# Patient Record
Sex: Male | Born: 1950 | Race: White | Hispanic: No | Smoking: Never smoker
Health system: Southern US, Community
[De-identification: ages and names within clinical notes are randomized; demographics above are authoritative.]

## PROBLEM LIST (undated history)

## (undated) DIAGNOSIS — I10 Essential (primary) hypertension: Secondary | ICD-10-CM

## (undated) DIAGNOSIS — E785 Hyperlipidemia, unspecified: Secondary | ICD-10-CM

## (undated) DIAGNOSIS — I5022 Chronic systolic (congestive) heart failure: Secondary | ICD-10-CM

## (undated) DIAGNOSIS — I4821 Permanent atrial fibrillation: Secondary | ICD-10-CM

## (undated) DIAGNOSIS — K625 Hemorrhage of anus and rectum: Secondary | ICD-10-CM

## (undated) DIAGNOSIS — I255 Ischemic cardiomyopathy: Secondary | ICD-10-CM

## (undated) DIAGNOSIS — I350 Nonrheumatic aortic (valve) stenosis: Secondary | ICD-10-CM

## (undated) DIAGNOSIS — K529 Noninfective gastroenteritis and colitis, unspecified: Secondary | ICD-10-CM

## (undated) DIAGNOSIS — K5792 Diverticulitis of intestine, part unspecified, without perforation or abscess without bleeding: Secondary | ICD-10-CM

## (undated) DIAGNOSIS — E118 Type 2 diabetes mellitus with unspecified complications: Secondary | ICD-10-CM

## (undated) DIAGNOSIS — I2510 Atherosclerotic heart disease of native coronary artery without angina pectoris: Secondary | ICD-10-CM

## (undated) HISTORY — DX: Hemorrhage of anus and rectum: K62.5

## (undated) HISTORY — DX: Nonrheumatic aortic (valve) stenosis: I35.0

## (undated) HISTORY — DX: Essential (primary) hypertension: I10

## (undated) HISTORY — DX: Chronic systolic (congestive) heart failure: I50.22

## (undated) HISTORY — PX: AORTIC VALVE REPLACEMENT: SHX41

## (undated) HISTORY — DX: Ischemic cardiomyopathy: I25.5

## (undated) HISTORY — DX: Type 2 diabetes mellitus with unspecified complications: E11.8

## (undated) HISTORY — PX: JOINT REPLACEMENT: SHX530

## (undated) HISTORY — PX: CORONARY ARTERY BYPASS GRAFT: SHX141

---

## 1998-07-03 HISTORY — PX: KNEE ARTHROPLASTY: SHX992

## 2017-02-24 ENCOUNTER — Emergency Department: Payer: BLUE CROSS/BLUE SHIELD

## 2017-02-24 ENCOUNTER — Inpatient Hospital Stay: Payer: BLUE CROSS/BLUE SHIELD

## 2017-02-24 ENCOUNTER — Inpatient Hospital Stay
Admission: EM | Admit: 2017-02-24 | Discharge: 2017-02-27 | DRG: 251 | Disposition: A | Discharge status: Home / Self Care | Payer: BLUE CROSS/BLUE SHIELD | Attending: Internal Medicine | Admitting: Internal Medicine

## 2017-02-24 ENCOUNTER — Encounter: Payer: Self-pay | Admitting: Emergency Medicine

## 2017-02-24 DIAGNOSIS — I252 Old myocardial infarction: Secondary | ICD-10-CM | POA: Diagnosis not present

## 2017-02-24 DIAGNOSIS — R7989 Other specified abnormal findings of blood chemistry: Secondary | ICD-10-CM | POA: Diagnosis not present

## 2017-02-24 DIAGNOSIS — I214 Non-ST elevation (NSTEMI) myocardial infarction: Secondary | ICD-10-CM | POA: Diagnosis not present

## 2017-02-24 DIAGNOSIS — E785 Hyperlipidemia, unspecified: Secondary | ICD-10-CM | POA: Diagnosis present

## 2017-02-24 DIAGNOSIS — R040 Epistaxis: Secondary | ICD-10-CM | POA: Diagnosis not present

## 2017-02-24 DIAGNOSIS — Z7982 Long term (current) use of aspirin: Secondary | ICD-10-CM

## 2017-02-24 DIAGNOSIS — I10 Essential (primary) hypertension: Secondary | ICD-10-CM | POA: Diagnosis present

## 2017-02-24 DIAGNOSIS — Z955 Presence of coronary angioplasty implant and graft: Secondary | ICD-10-CM

## 2017-02-24 DIAGNOSIS — Z888 Allergy status to other drugs, medicaments and biological substances status: Secondary | ICD-10-CM | POA: Diagnosis not present

## 2017-02-24 DIAGNOSIS — I2510 Atherosclerotic heart disease of native coronary artery without angina pectoris: Secondary | ICD-10-CM | POA: Diagnosis not present

## 2017-02-24 DIAGNOSIS — I25119 Atherosclerotic heart disease of native coronary artery with unspecified angina pectoris: Secondary | ICD-10-CM | POA: Diagnosis present

## 2017-02-24 DIAGNOSIS — K921 Melena: Secondary | ICD-10-CM | POA: Diagnosis not present

## 2017-02-24 DIAGNOSIS — R079 Chest pain, unspecified: Secondary | ICD-10-CM | POA: Diagnosis present

## 2017-02-24 DIAGNOSIS — Z7902 Long term (current) use of antithrombotics/antiplatelets: Secondary | ICD-10-CM

## 2017-02-24 DIAGNOSIS — Y92230 Patient room in hospital as the place of occurrence of the external cause: Secondary | ICD-10-CM | POA: Diagnosis not present

## 2017-02-24 DIAGNOSIS — T45515A Adverse effect of anticoagulants, initial encounter: Secondary | ICD-10-CM | POA: Diagnosis not present

## 2017-02-24 DIAGNOSIS — Z79899 Other long term (current) drug therapy: Secondary | ICD-10-CM

## 2017-02-24 DIAGNOSIS — E119 Type 2 diabetes mellitus without complications: Secondary | ICD-10-CM | POA: Diagnosis present

## 2017-02-24 DIAGNOSIS — Z951 Presence of aortocoronary bypass graft: Secondary | ICD-10-CM

## 2017-02-24 DIAGNOSIS — I359 Nonrheumatic aortic valve disorder, unspecified: Secondary | ICD-10-CM | POA: Diagnosis present

## 2017-02-24 DIAGNOSIS — E876 Hypokalemia: Secondary | ICD-10-CM | POA: Diagnosis present

## 2017-02-24 DIAGNOSIS — Y831 Surgical operation with implant of artificial internal device as the cause of abnormal reaction of the patient, or of later complication, without mention of misadventure at the time of the procedure: Secondary | ICD-10-CM | POA: Diagnosis present

## 2017-02-24 DIAGNOSIS — Z952 Presence of prosthetic heart valve: Secondary | ICD-10-CM

## 2017-02-24 DIAGNOSIS — T82855A Stenosis of coronary artery stent, initial encounter: Secondary | ICD-10-CM | POA: Diagnosis present

## 2017-02-24 DIAGNOSIS — Z8249 Family history of ischemic heart disease and other diseases of the circulatory system: Secondary | ICD-10-CM | POA: Diagnosis not present

## 2017-02-24 HISTORY — DX: Noninfective gastroenteritis and colitis, unspecified: K52.9

## 2017-02-24 HISTORY — DX: Diverticulitis of intestine, part unspecified, without perforation or abscess without bleeding: K57.92

## 2017-02-24 HISTORY — DX: Atherosclerotic heart disease of native coronary artery without angina pectoris: I25.10

## 2017-02-24 LAB — BASIC METABOLIC PANEL
ANION GAP: 10 (ref 5–15)
BUN: 16 mg/dL (ref 6–20)
CHLORIDE: 99 mmol/L — AB (ref 101–111)
CO2: 27 mmol/L (ref 22–32)
Calcium: 9.6 mg/dL (ref 8.9–10.3)
Creatinine, Ser: 0.68 mg/dL (ref 0.61–1.24)
GFR calc Af Amer: >60 mL/min (ref >60)
GLUCOSE: 344 mg/dL — AB (ref 65–99)
POTASSIUM: 3.3 mmol/L — AB (ref 3.5–5.1)
Sodium: 136 mmol/L (ref 135–145)

## 2017-02-24 LAB — TROPONIN I
Troponin I: 0.03 ng/mL (ref <0.03)
Troponin I: 0.4 ng/mL (ref <0.03)
Troponin I: 1.92 ng/mL (ref <0.03)
Troponin I: 2.71 ng/mL (ref <0.03)

## 2017-02-24 LAB — CBC
HEMATOCRIT: 41.4 % (ref 40.0–52.0)
HEMOGLOBIN: 14 g/dL (ref 13.0–18.0)
MCH: 26.8 pg (ref 26.0–34.0)
MCHC: 33.9 g/dL (ref 32.0–36.0)
MCV: 79.1 fL — AB (ref 80.0–100.0)
Platelets: 203 10*3/uL (ref 150–440)
RBC: 5.23 MIL/uL (ref 4.40–5.90)
RDW: 16.2 % — AB (ref 11.5–14.5)
WBC: 8.9 10*3/uL (ref 3.8–10.6)

## 2017-02-24 LAB — PROTIME-INR
INR: 1.06
PROTHROMBIN TIME: 13.8 s (ref 11.4–15.2)

## 2017-02-24 LAB — GLUCOSE, CAPILLARY
Glucose-Capillary: 182 mg/dL — ABNORMAL HIGH (ref 65–99)
Glucose-Capillary: 149 mg/dL — ABNORMAL HIGH (ref 65–99)
Glucose-Capillary: 200 mg/dL — ABNORMAL HIGH (ref 65–99)
Glucose-Capillary: 224 mg/dL — ABNORMAL HIGH (ref 65–99)
Glucose-Capillary: 176 mg/dL — ABNORMAL HIGH (ref 65–99)

## 2017-02-24 LAB — HEPARIN LEVEL (UNFRACTIONATED)
HEPARIN UNFRACTIONATED: 0.38 [IU]/mL (ref 0.30–0.70)
Heparin Unfractionated: 0.46 IU/mL (ref 0.30–0.70)

## 2017-02-24 LAB — APTT: aPTT: 27 seconds (ref 24–36)

## 2017-02-24 LAB — MAGNESIUM: Magnesium: 1.9 mg/dL (ref 1.7–2.4)

## 2017-02-24 LAB — TSH: TSH: 6.858 u[IU]/mL — ABNORMAL HIGH (ref 0.350–4.500)

## 2017-02-24 MED ORDER — ENOXAPARIN SODIUM 40 MG/0.4ML ~~LOC~~ SOLN: 40.0000 mg | SUBCUTANEOUS | Status: DC

## 2017-02-24 MED ORDER — ASPIRIN EC 81 MG PO TBEC
81.0000 mg | DELAYED_RELEASE_TABLET | Freq: Every day | ORAL | Status: DC
  Administered 2017-02-24 – 2017-02-27 (×3): 81 mg via ORAL
  Filled 2017-02-24 (×3): qty 1

## 2017-02-24 MED ORDER — TIROFIBAN (AGGRASTAT) BOLUS VIA INFUSION: 25.0000 ug/kg | Freq: Once | INTRAVENOUS | Status: DC

## 2017-02-24 MED ORDER — MORPHINE SULFATE (PF) 2 MG/ML IV SOLN
2.0000 mg | Freq: Once | INTRAVENOUS | Status: AC
  Administered 2017-02-24: 2 mg via INTRAVENOUS
  Filled 2017-02-24: qty 1

## 2017-02-24 MED ORDER — ACETAMINOPHEN 650 MG RE SUPP: 650.0000 mg | Freq: Four times a day (QID) | RECTAL | Status: DC | PRN

## 2017-02-24 MED ORDER — HEPARIN (PORCINE) IN NACL 100-0.45 UNIT/ML-% IJ SOLN
1200.0000 [IU]/h | INTRAMUSCULAR | Status: DC
  Administered 2017-02-24 – 2017-02-25 (×3): 1200 [IU]/h via INTRAVENOUS
  Filled 2017-02-24 (×3): qty 250

## 2017-02-24 MED ORDER — INSULIN ASPART 100 UNIT/ML ~~LOC~~ SOLN
0.0000 [IU] | Freq: Three times a day (TID) | SUBCUTANEOUS | Status: DC
Start: 2017-02-24 — End: 2017-02-27
  Administered 2017-02-24: 3 [IU] via SUBCUTANEOUS
  Administered 2017-02-24: 2 [IU] via SUBCUTANEOUS
  Administered 2017-02-24: 1 [IU] via SUBCUTANEOUS
  Administered 2017-02-25: 2 [IU] via SUBCUTANEOUS
  Administered 2017-02-25: 1 [IU] via SUBCUTANEOUS
  Administered 2017-02-25 – 2017-02-26 (×2): 2 [IU] via SUBCUTANEOUS
  Filled 2017-02-24 (×8): qty 1

## 2017-02-24 MED ORDER — SODIUM CHLORIDE 0.9 % IV SOLN
INTRAVENOUS | Status: DC
  Administered 2017-02-24 – 2017-02-27 (×4): via INTRAVENOUS

## 2017-02-24 MED ORDER — NITROGLYCERIN 0.4 MG SL SUBL
0.4000 mg | SUBLINGUAL_TABLET | SUBLINGUAL | Status: DC | PRN
  Administered 2017-02-24 – 2017-02-25 (×7): 0.4 mg via SUBLINGUAL
  Filled 2017-02-24 (×6): qty 1

## 2017-02-24 MED ORDER — TIROFIBAN (AGGRASTAT) BOLUS VIA INFUSION
25.0000 ug/kg | Freq: Once | INTRAVENOUS | Status: AC
  Administered 2017-02-24: 2580 ug via INTRAVENOUS
  Filled 2017-02-24: qty 52

## 2017-02-24 MED ORDER — MECLIZINE HCL 25 MG PO TABS
25.0000 mg | ORAL_TABLET | Freq: Three times a day (TID) | ORAL | Status: DC | PRN
  Filled 2017-02-24: qty 1

## 2017-02-24 MED ORDER — POTASSIUM CHLORIDE CRYS ER 20 MEQ PO TBCR
EXTENDED_RELEASE_TABLET | ORAL | Status: AC
  Administered 2017-02-24: 40 meq via ORAL
  Filled 2017-02-24: qty 2

## 2017-02-24 MED ORDER — DOCUSATE SODIUM 100 MG PO CAPS
100.0000 mg | ORAL_CAPSULE | Freq: Two times a day (BID) | ORAL | Status: DC
  Administered 2017-02-24 – 2017-02-27 (×4): 100 mg via ORAL
  Filled 2017-02-24 (×7): qty 1

## 2017-02-24 MED ORDER — FLUTICASONE PROPIONATE 50 MCG/ACT NA SUSP
1.0000 | Freq: Every day | NASAL | Status: DC
  Administered 2017-02-24 – 2017-02-27 (×4): 1 via NASAL
  Filled 2017-02-24: qty 16

## 2017-02-24 MED ORDER — LINAGLIPTIN 5 MG PO TABS
5.0000 mg | ORAL_TABLET | Freq: Every day | ORAL | Status: DC
  Administered 2017-02-24 – 2017-02-27 (×4): 5 mg via ORAL
  Filled 2017-02-24 (×4): qty 1

## 2017-02-24 MED ORDER — TIROFIBAN HCL IN NACL 5-0.9 MG/100ML-% IV SOLN: 0.1500 ug/kg/min | INTRAVENOUS | Status: DC

## 2017-02-24 MED ORDER — HEPARIN BOLUS VIA INFUSION
4000.0000 [IU] | Freq: Once | INTRAVENOUS | Status: AC
  Administered 2017-02-24: 4000 [IU] via INTRAVENOUS
  Filled 2017-02-24: qty 4000

## 2017-02-24 MED ORDER — ACETAMINOPHEN 325 MG PO TABS: 650.0000 mg | ORAL_TABLET | Freq: Four times a day (QID) | ORAL | Status: DC | PRN

## 2017-02-24 MED ORDER — INSULIN GLARGINE 100 UNIT/ML ~~LOC~~ SOLN
8.0000 [IU] | Freq: Every day | SUBCUTANEOUS | Status: AC
Start: 2017-02-24 — End: 2017-02-25
  Filled 2017-02-24: qty 0.08

## 2017-02-24 MED ORDER — TIROFIBAN HCL IN NACL 5-0.9 MG/100ML-% IV SOLN
0.1500 ug/kg/min | INTRAVENOUS | Status: DC
  Administered 2017-02-24 – 2017-02-25 (×4): 0.15 ug/kg/min via INTRAVENOUS
  Filled 2017-02-24 (×8): qty 100

## 2017-02-24 MED ORDER — ONDANSETRON HCL 4 MG/2ML IJ SOLN
4.0000 mg | Freq: Four times a day (QID) | INTRAMUSCULAR | Status: DC | PRN
  Administered 2017-02-25: 4 mg via INTRAVENOUS
  Filled 2017-02-24: qty 2

## 2017-02-24 MED ORDER — PANTOPRAZOLE SODIUM 40 MG PO TBEC
40.0000 mg | DELAYED_RELEASE_TABLET | Freq: Every day | ORAL | Status: DC
  Administered 2017-02-24 – 2017-02-27 (×4): 40 mg via ORAL
  Filled 2017-02-24 (×4): qty 1

## 2017-02-24 MED ORDER — POTASSIUM CHLORIDE CRYS ER 20 MEQ PO TBCR
40.0000 meq | EXTENDED_RELEASE_TABLET | Freq: Once | ORAL | Status: AC
  Administered 2017-02-24: 40 meq via ORAL
  Filled 2017-02-24: qty 2

## 2017-02-24 MED ORDER — LABETALOL HCL 5 MG/ML IV SOLN
INTRAVENOUS | Status: AC
  Filled 2017-02-24: qty 4

## 2017-02-24 MED ORDER — CLOPIDOGREL BISULFATE 75 MG PO TABS
75.0000 mg | ORAL_TABLET | Freq: Every day | ORAL | Status: DC
  Administered 2017-02-24 – 2017-02-27 (×4): 75 mg via ORAL
  Filled 2017-02-24 (×4): qty 1

## 2017-02-24 MED ORDER — MORPHINE SULFATE (PF) 2 MG/ML IV SOLN
1.0000 mg | INTRAVENOUS | Status: DC | PRN
  Administered 2017-02-24 (×2): 1 mg via INTRAVENOUS
  Administered 2017-02-24 – 2017-02-25 (×3): 2 mg via INTRAVENOUS
  Filled 2017-02-24 (×5): qty 1

## 2017-02-24 MED ORDER — INSULIN ASPART 100 UNIT/ML ~~LOC~~ SOLN: 0.0000 [IU] | Freq: Every day | SUBCUTANEOUS | Status: DC

## 2017-02-24 MED ORDER — LISINOPRIL 10 MG PO TABS
10.0000 mg | ORAL_TABLET | Freq: Every day | ORAL | Status: DC
  Administered 2017-02-24 – 2017-02-26 (×3): 10 mg via ORAL
  Filled 2017-02-24 (×3): qty 1

## 2017-02-24 MED ORDER — IOPAMIDOL (ISOVUE-370) INJECTION 76%
75.0000 mL | Freq: Once | INTRAVENOUS | Status: AC | PRN
  Administered 2017-02-24: 75 mL via INTRAVENOUS

## 2017-02-24 MED ORDER — ONDANSETRON HCL 4 MG PO TABS: 4.0000 mg | ORAL_TABLET | Freq: Four times a day (QID) | ORAL | Status: DC | PRN

## 2017-02-24 MED ORDER — ONDANSETRON HCL 4 MG/2ML IJ SOLN
4.0000 mg | Freq: Once | INTRAMUSCULAR | Status: AC
  Administered 2017-02-24: 4 mg via INTRAVENOUS
  Filled 2017-02-24: qty 2

## 2017-02-24 MED ORDER — LABETALOL HCL 5 MG/ML IV SOLN
10.0000 mg | INTRAVENOUS | Status: AC
  Administered 2017-02-24: 10 mg via INTRAVENOUS

## 2017-02-24 MED ORDER — METOPROLOL SUCCINATE ER 25 MG PO TB24
25.0000 mg | ORAL_TABLET | Freq: Every day | ORAL | Status: DC
  Administered 2017-02-24 – 2017-02-27 (×4): 25 mg via ORAL
  Filled 2017-02-24 (×4): qty 1

## 2017-02-24 MED ORDER — ATORVASTATIN CALCIUM 20 MG PO TABS
80.0000 mg | ORAL_TABLET | Freq: Every day | ORAL | Status: DC
  Administered 2017-02-24 – 2017-02-27 (×4): 80 mg via ORAL
  Filled 2017-02-24 (×4): qty 4

## 2017-02-24 MED ORDER — POTASSIUM CHLORIDE CRYS ER 20 MEQ PO TBCR
40.0000 meq | EXTENDED_RELEASE_TABLET | ORAL | Status: AC
  Administered 2017-02-24: 40 meq via ORAL

## 2017-02-24 NOTE — ED Provider Notes (Signed)
[facility] Emergency Department Provider Note   ____________________________________________   First MD Initiated Contact with Patient 02/24/17 0207     (approximate)  I have reviewed the triage vital signs and the nursing notes.   HISTORY  Chief Complaint Chest Pain    HPI Christopher Bayona Sr. is a 66 y.o. male who comes into the hospital today with chest pain. The patient reports that the pain started about 30-40 minutes prior to his arrival to the hospital. He was sleeping and he initially felt like he was having indigestion. He took 4 baby aspirin but the pain persisted in his mid chest. He states that EMS placed a Nitropaste on his chest and his blood pressure was over 200 systolic. The patient has had no sweats but has had some shortness of breath and nausea. The patient reports that the pain is in his mid chest and it radiates down both of his arms and his back. He denies any dizziness or lightheadedness. 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 His pain is currently a 7 out of 10 in intensity. He does endorse a headache.   Past Medical History:  Diagnosis Date  . CAD (coronary artery disease)   . Colitis   . Diabetes mellitus without complication (HCC)   . Diverticulitis   . Hypertension     Patient Active Problem List   Diagnosis Date Noted  . Chest pain 02/24/2017    Past Surgical History:  Procedure Laterality Date  . AORTIC VALVE REPLACEMENT    . CORONARY ARTERY BYPASS GRAFT      Prior to Admission medications   Medication Sig Start Date End Date Taking? Authorizing Provider  aspirin EC 81 MG tablet Take 81 mg by mouth daily.   Yes [provider]  atorvastatin (LIPITOR) 80 MG tablet Take 80 mg by mouth daily.   Yes [provider]  clopidogrel (PLAVIX) 75 MG tablet Take 75 mg by mouth daily.   Yes [provider]  fluticasone (FLONASE) 50 MCG/ACT nasal spray Place 1 spray into both  nostrils daily.   Yes [provider]  lisinopril (PRINIVIL,ZESTRIL) 10 MG tablet Take 10 mg by mouth at bedtime. 02/20/17  Yes [provider]  meclizine (ANTIVERT) 25 MG tablet Take 25 mg by mouth 3 (three) times daily as needed for dizziness.   Yes [provider]  metoprolol succinate (TOPROL-XL) 25 MG 24 hr tablet Take 25 mg by mouth daily.   Yes [provider]  pantoprazole (PROTONIX) 40 MG tablet Take 40 mg by mouth daily.   Yes [provider]  sitaGLIPtin (JANUVIA) 50 MG tablet Take 100 mg by mouth daily.   Yes [provider]    Allergies Metformin and related  Family History  Problem Relation Age of Onset  . CAD Mother   . Heart failure Mother   . CAD Brother     Social History Social History  Substance Use Topics  . Smoking status: Never Smoker  . Smokeless tobacco: Never Used  . Alcohol use No    Review of Systems  Constitutional: No fever/chills Eyes: No visual changes. ENT: No sore throat. Cardiovascular:  chest pain. Respiratory:  shortness of breath. Gastrointestinal: nausea with No abdominal pain, no vomiting.  No diarrhea.  No constipation. Genitourinary: Negative for dysuria. Musculoskeletal: Negative for back pain. Skin: Negative for rash. Neurological: Negative for headaches, focal weakness or numbness.   ____________________________________________   PHYSICAL  EXAM:  VITAL SIGNS: ED Triage Vitals  Enc Vitals Group     BP 02/24/17 0211 (!) 195/97     Pulse Rate 02/24/17 0211 93     Resp 02/24/17 0211 (!) 24     Temp 02/24/17 0211 98.6 F (37 C)     Temp Source 02/24/17 0211 Oral     SpO2 02/24/17 0211 100 %     Weight 02/24/17 0214 228 lb (103.4 kg)     Height 02/24/17 0214 5\' 10"  (1.778 m)     Head Circumference --      Peak Flow --      Pain Score 02/24/17 0210 7     Pain Loc --      Pain Edu? --      Excl. in GC? --     Constitutional: Alert and oriented. Well appearing and  in moderate distress. Eyes: Conjunctivae are normal. PERRL. EOMI. Head: Atraumatic. Nose: No congestion/rhinnorhea. Mouth/Throat: Mucous membranes are moist.  Oropharynx non-erythematous. Cardiovascular: Normal rate, regular rhythm. Grossly normal heart sounds.  Good peripheral circulation. Respiratory: Normal respiratory effort.  No retractions. Lungs CTAB. Gastrointestinal: Soft and nontender. No distention. Positive bowel sounds Musculoskeletal: No lower extremity tenderness nor edema.   Neurologic:  Normal speech and language. Skin:  Skin is warm, dry and intact.  Psychiatric: Mood and affect are normal.  ____________________________________________   LABS (all labs ordered are listed, but only abnormal results are displayed)  Labs Reviewed  BASIC METABOLIC PANEL - Abnormal; Notable for the following:       Result Value   Potassium 3.3 (*)    Chloride 99 (*)    Glucose, Bld 344 (*)    All other components within normal limits  CBC - Abnormal; Notable for the following:    MCV 79.1 (*)    RDW 16.2 (*)    All other components within normal limits  TROPONIN I - Abnormal; Notable for the following:    Troponin I 0.03 (*)    All other components within normal limits  PROTIME-INR  APTT  TSH  TROPONIN I  TROPONIN I  TROPONIN I   ____________________________________________  EKG  ED ECG REPORT I, Rebecka Apley, the attending physician, personally viewed and interpreted this ECG.   Date: 02/24/2017  EKG Time: 210  Rate: 96  Rhythm: normal sinus rhythm  Axis: normal  Intervals:none  ST&T Change: ST depression in leads I, V4, V5  ____________________________________________  RADIOLOGY  Dg Chest Portable 1 View  Result Date: 02/24/2017 CLINICAL DATA:  66 year old male awoken by chest pain shortness of breath and nausea. EXAM: PORTABLE CHEST 1 VIEW COMPARISON:  None. FINDINGS: Portable AP upright view at 0220 hours. Previous sternotomy with evidence of aortic  valve replacement and CABG. Cardiomegaly. Other mediastinal contours are within normal limits. Visualized tracheal air column is within normal limits. Mild obscuration of the medial diaphragm appears likely due to cardiomegaly related hypo ventilation. Otherwise allowing for portable technique the lungs are clear. No pneumothorax or pleural effusion. IMPRESSION: Cardiomegaly.  No acute cardiopulmonary abnormality. Electronically Signed   By: Odessa Fleming M.D.   On: 02/24/2017 02:37    ____________________________________________   PROCEDURES  Procedure(s) performed: None  Procedures  Critical Care performed: No  ____________________________________________   INITIAL IMPRESSION / ASSESSMENT AND PLAN / ED COURSE  Pertinent labs & imaging results that were available during my care of the patient were reviewed by me and considered in my medical decision making (see chart for  details).  This is a 66 year old male who comes into the hospital with chest pain. The patient has a significant cardiac history and would score a 7 on the heart score. The patient did receive some NTG which lowered his pain to a 5 and he did then receive some morphine for his pain. Given his continued pain and his history I will admit the patient to the hospitalist service.       ____________________________________________   FINAL CLINICAL IMPRESSION(S) / ED DIAGNOSES  Final diagnoses:  Chest pain, unspecified type      NEW MEDICATIONS STARTED DURING THIS VISIT:  Current Discharge Medication List       Note:  This document was prepared using Dragon voice recognition software and may include unintentional dictation errors.    Rebecka Apley, MD 02/24/17 [phone]

## 2017-02-24 NOTE — Progress Notes (Signed)
  MEDICATION RELATED CONSULT NOTE - INITIAL   Pharmacy Consult for Tirofiban Indication: ACS  Allergies  Allergen Reactions  . Metformin And Related     Patient Measurements: Height: 5\' 10"  (177.8 cm) Weight: 227 lb 9.6 oz (103.2 kg) IBW/kg (Calculated) : 73 Adjusted Body Weight:   Vital Signs: Temp: 98.1 F (36.7 C) (08/25 1306) Temp Source: Oral (08/25 1306) BP: 115/73 (08/25 1652) Pulse Rate: 60 (08/25 1613) Intake/Output from previous day: No intake/output data recorded. Intake/Output from this shift: Total I/O In: 137.7 [I.V.:137.7] Out: 250 [Urine:250]  Labs:  Recent Labs  02/24/17 0214 02/24/17 1017  WBC 8.9  --   HGB 14.0  --   HCT 41.4  --   PLT 203  --   APTT 27  --   CREATININE 0.68  --   MG  --  1.9   Estimated Creatinine Clearance: 112.3 mL/min (by C-G formula based on SCr of 0.68 mg/dL).   Microbiology: No results found for this or any previous visit (from the past 720 hour(s)).  Medical History: Past Medical History:  Diagnosis Date  . CAD (coronary artery disease)   . Colitis   . Diabetes mellitus without complication (HCC)   . Diverticulitis   . Hypertension     Medications:  Infusions:  . sodium chloride 75 mL/hr at 02/24/17 1607  . heparin 1,200 Units/hr (02/24/17 0859)  . tirofiban      Assessment: 66 yom with rising troponin. Planned cath on Monday. Pharmacy consulted to dose tirofiban - severely limited by Epic orderables.   Goal of Therapy:    Plan:  Tirofiban per orderset.  Carola Frost, Pharm.D., BCPS Clinical Pharmacist 02/24/2017,5:31 PM

## 2017-02-24 NOTE — Consult Note (Addendum)
Primary Physician:  Lorella Nimrod Zutler.   Primary Cardiologist:  New  Asked to see by Dr Sheryle Hail for CP  HPI: Pt is a 66 yo with history of CAD (s/p mult MI, PCI and CABG), DM, HTN   Admitted this AM with CP  Pain woke him from sleep  Substernal to back, arms.  8/10  In ED eased some with HTG.     Pt's cardiac hx dates to about 2008  Had stent placed ([facility] [place])  Did fine after until 2016 (December )   MI  Underwent CABG x 3 and AVR (23mm Edwards tissue valve on Jan 2017).  Did OK for 2 to 3 months Then downhill after  Init hosp with pneumonia  Has been hosp mult times for CP  Has had several stents  Last was 2 months ago   Complic after by bleeding in groin.  Vivien Presto, [facility] [phone]; Henrine Screws, [facility])  Did ok after last stent until now.  Just moved to [place] 3 wks ago to be near sister. Breathing has been OK throughout this Seen at [facility] week  Lisinopril increasd Had appt being set up at [facility] in cardiology  Not seen yet  Yesterday went to bed feeling OK  GOt up around 2 Felt he had indigestion  Then got pain in chest that went to back then to arms  (With prior angina he had CP and usually numbness but no pain in arms   Pain to back new.  Never had before)  Pt has no NTG at home   Took 4 ASA  81 mg  No missed meds  Called EMS  Got to ED at about 2 AM    In ED given NTG with easing  Now it is 2/10  No pain in back  No pain in arms  No jaw pain  Breathing OK throughout  Sl pressure    Dr Donnel Saxon at [facility] had just increased lisinopil to 20 mg   No dizzienss     ADDENDUM 02/24/17 Spoke with on call cardiologist in [place] CABG in Feb 2017:  LIMA to LAD, SVG to Diag  AVR Oct 2017 Cath  LIMA to LAD patent (distal anastomosis); SVG to Diag occluded.  Pt with tight stenoses prox and mid LCx  UNderwent DES x 2   RCA with 60% Echo November 2017 :  LVEF 60%  17 mm gradient across prosthesis Cath March 2018:  LCx  stents OK  LIMA patent  95% D1 Chronic dz  Left alone  RCA with 90% mid  Underwent DES to this region Seen in clinic in May 2018 by Dr Baird Lyons  Office number for records :  Madison Hickman  [phone]  Patient had another cath about 1-2 months ago at [facility] in [place]  No new narrowings found per family  Diabetic x 10 year      Past Medical History:  Diagnosis Date  . CAD (coronary artery disease)   . Colitis   . Diabetes mellitus without complication (HCC)   . Diverticulitis   . Hypertension     Medications Prior to Admission  Medication Sig Dispense Refill  . aspirin EC 81 MG tablet Take 81 mg by mouth daily.    Marland Kitchen atorvastatin (LIPITOR) 80 MG tablet Take 80 mg by mouth daily.    . clopidogrel (PLAVIX) 75 MG tablet Take 75 mg by mouth daily.    Marland Kitchen  fluticasone (FLONASE) 50 MCG/ACT nasal spray Place 1 spray into both nostrils daily.    Marland Kitchen lisinopril (PRINIVIL,ZESTRIL) 10 MG tablet Take 10 mg by mouth at bedtime.  3  . meclizine (ANTIVERT) 25 MG tablet Take 25 mg by mouth 3 (three) times daily as needed for dizziness.    . metoprolol succinate (TOPROL-XL) 25 MG 24 hr tablet Take 25 mg by mouth daily.    . pantoprazole (PROTONIX) 40 MG tablet Take 40 mg by mouth daily.    . sitaGLIPtin (JANUVIA) 50 MG tablet Take 100 mg by mouth daily.       Marland Kitchen aspirin EC  81 mg Oral Daily  . atorvastatin  80 mg Oral Daily  . clopidogrel  75 mg Oral Daily  . docusate sodium  100 mg Oral BID  . fluticasone  1 spray Each Nare Daily  . insulin aspart  0-5 Units Subcutaneous QHS  . insulin aspart  0-9 Units Subcutaneous TID WC  . insulin glargine  8 Units Subcutaneous QHS  . linagliptin  5 mg Oral Daily  . lisinopril  10 mg Oral QHS  . metoprolol succinate  25 mg Oral Daily  . pantoprazole  40 mg Oral Daily    Infusions: . heparin 1,200 Units/hr (02/24/17 0859)    Allergies  Allergen Reactions  . Metformin And Related     Social History   Social History  . Marital status: Divorced      Spouse name: N/A  . Number of children: N/A  . Years of education: N/A   Occupational History  . Not on file.   Social History Main Topics  . Smoking status: Never Smoker  . Smokeless tobacco: Never Used  . Alcohol use No  . Drug use: Unknown  . Sexual activity: Not on file   Other Topics Concern  . Not on file   Social History Narrative  . No narrative on file    Family History  Problem Relation Age of Onset  . CAD Mother   . Heart failure Mother   . CAD Brother   Brother died of MI   REVIEW OF SYSTEMS:  All systems reviewed  Negative to the above problem except as noted above.    PHYSICAL EXAM: Vitals:   02/24/17 0424 02/24/17 0811  BP: 137/65 116/60  Pulse: 80 76  Resp: 18 16  Temp: 98.2 F (36.8 C) 97.6 F (36.4 C)  SpO2: 92% 93%    No intake or output data in the 24 hours ending 02/24/17 0923  General:  Well appearing. No respiratory difficulty HEENT: normal Neck: supple. no JVD. Carotids 2+ bilat; Bruit on L No lymphadenopathy or thryomegaly appreciated. Cor: PMI nondisplaced. Regular rate & rhythm. No rubs, gallops  Gr I/VI systolic murmur   Lungs: clear Abdomen: soft, nontender, nondistended. No hepatosplenomegaly. No bruits or masses. Good bowel sounds. Extremities: no cyanosis, clubbing, rash, edema  2+ R PT   Tr L PT Neuro: alert & oriented x 3, cranial nerves grossly intact. moves all 4 extremities w/o difficulty. Affect pleasant.  ECG:  SR 96 bpm  First degree AV block  PR 210 msec Incomp RBBB  SL ST depression in lateral leads    Results for orders placed or performed during the hospital encounter of 02/24/17 (from the past 24 hour(s))  Basic metabolic panel     Status: Abnormal   Collection Time: 02/24/17  2:14 AM  Result Value Ref Range   Sodium 136 135 - 145 mmol/L  Potassium 3.3 (L) 3.5 - 5.1 mmol/L   Chloride 99 (L) 101 - 111 mmol/L   CO2 27 22 - 32 mmol/L   Glucose, Bld 344 (H) 65 - 99 mg/dL   BUN 16 6 - 20 mg/dL   Creatinine,  Ser 1.61 0.61 - 1.24 mg/dL   Calcium 9.6 8.9 - 09.6 mg/dL   GFR calc non Af Amer >60 >60 mL/min   GFR calc Af Amer >60 >60 mL/min   Anion gap 10 5 - 15  CBC     Status: Abnormal   Collection Time: 02/24/17  2:14 AM  Result Value Ref Range   WBC 8.9 3.8 - 10.6 K/uL   RBC 5.23 4.40 - 5.90 MIL/uL   Hemoglobin 14.0 13.0 - 18.0 g/dL   HCT 04.5 40.9 - 81.1 %   MCV 79.1 (L) 80.0 - 100.0 fL   MCH 26.8 26.0 - 34.0 pg   MCHC 33.9 32.0 - 36.0 g/dL   RDW 91.4 (H) 78.2 - 95.6 %   Platelets 203 150 - 440 K/uL  Troponin I     Status: Abnormal   Collection Time: 02/24/17  2:14 AM  Result Value Ref Range   Troponin I 0.03 (HH) <0.03 ng/mL  Protime-INR     Status: None   Collection Time: 02/24/17  2:14 AM  Result Value Ref Range   Prothrombin Time 13.8 11.4 - 15.2 seconds   INR 1.06   APTT     Status: None   Collection Time: 02/24/17  2:14 AM  Result Value Ref Range   aPTT 27 24 - 36 seconds  TSH     Status: Abnormal   Collection Time: 02/24/17  4:29 AM  Result Value Ref Range   TSH 6.858 (H) 0.350 - 4.500 uIU/mL  Troponin I     Status: Abnormal   Collection Time: 02/24/17  4:29 AM  Result Value Ref Range   Troponin I 0.40 (HH) <0.03 ng/mL  Glucose, capillary     Status: Abnormal   Collection Time: 02/24/17  8:53 AM  Result Value Ref Range   Glucose-Capillary 182 (H) 65 - 99 mg/dL   Dg Chest Portable 1 View  Result Date: 02/24/2017 CLINICAL DATA:  66 year old male awoken by chest pain shortness of breath and nausea. EXAM: PORTABLE CHEST 1 VIEW COMPARISON:  None. FINDINGS: Portable AP upright view at 0220 hours. Previous sternotomy with evidence of aortic valve replacement and CABG. Cardiomegaly. Other mediastinal contours are within normal limits. Visualized tracheal air column is within normal limits. Mild obscuration of the medial diaphragm appears likely due to cardiomegaly related hypo ventilation. Otherwise allowing for portable technique the lungs are clear. No pneumothorax or  pleural effusion. IMPRESSION: Cardiomegaly.  No acute cardiopulmonary abnormality. Electronically Signed   By: Odessa Fleming M.D.   On: 02/24/2017 02:37     ASSESSMENT:   1  CP  Pt with long cardiac history  Mult stents by his report after CABG in 2017.  2 months post most recent intervention   Will try to get records Last night woke up with pain Chest and back  Back new   Now almost gone  EKG with sl depression, no elevation. Plan:  Continue IV heparin  Cycle troponin  Continue meds   Morphine and NTG as needed  If pain worsens consider CT chest to r/o aortic problem   I have contacted [facility] to try to get records from surgery, interventions    Echo this weekend  Tentative cath on Mon unless course changes  Risks/benefits explained  Pt understands and agrees to proceed.  2  CAD  As above  3  AV dz  S/p AVR (Tissue valve ) in 2017  Echo    3  HTN  BP improved  Follow    4  HL  Continue statin  Labs in AM  5  CV dz  Will need to get records and cont f/u as outpt     5  DM  On insulin

## 2017-02-24 NOTE — ED Notes (Signed)
Pt transport to 241

## 2017-02-24 NOTE — Progress Notes (Addendum)
Spoke with dr Imogene Burn to make aware patient c/o chest pain 4/10 squeezing pain that does not radiate. Two SL nitro given, 4mg  iv morphine given no relief of pain. Current bp 103/55, per md page dr. Tenny Craw to see if patient needs nitro drip

## 2017-02-24 NOTE — H&P (Signed)
Christopher Babler Sr. is an 66 y.o. male.   Chief Complaint: Chest pain HPI: The patient with past medical history of coronary artery disease status post multiple MI and CABG with subsequent PCI, diabetes and hypertension presents to the emergency department complaining of chest pain. The pain awoke him from sleep. It was centralized substernal radiating to both arms into his back. The patient characterizes his discomfort as pressure that was initially 8 out of 10 in severity. After receiving Nitropaste to his chest in the emergency department his pain decreased to 4 out of 10 in severity. He denies shortness of breath but admits to intermittent nausea. He denies vomiting or diaphoresis. Systolic blood pressure was 100 greater than 200 upon arrival. Initial troponin was negative but due to risk factors emergency department staff called the hospitalist's for admission.  Past Medical History:  Diagnosis Date  . CAD (coronary artery disease)   . Colitis   . Diabetes mellitus without complication ([place])   . Diverticulitis   . Hypertension     Past Surgical History:  Procedure Laterality Date  . AORTIC VALVE REPLACEMENT    . CORONARY ARTERY BYPASS GRAFT      Family History  Problem Relation Age of Onset  . CAD Mother   . Heart failure Mother   . CAD Brother    Social History:  reports that he has never smoked. He has never used smokeless tobacco. He reports that he does not drink alcohol. His drug history is not on file.  Allergies:  Allergies  Allergen Reactions  . Metformin And Related     Prior to Admission medications   Medication Sig Start Date End Date Taking? Authorizing Provider  aspirin EC 81 MG tablet Take 81 mg by mouth daily.   Yes [provider]  atorvastatin (LIPITOR) 80 MG tablet Take 80 mg by mouth daily.   Yes [provider]  clopidogrel (PLAVIX) 75 MG tablet Take 75 mg by mouth daily.   Yes [provider]  fluticasone (FLONASE) 50 MCG/ACT  nasal spray Place 1 spray into both nostrils daily.   Yes [provider]  lisinopril (PRINIVIL,ZESTRIL) 10 MG tablet Take 10 mg by mouth at bedtime. 02/20/17  Yes [provider]  meclizine (ANTIVERT) 25 MG tablet Take 25 mg by mouth 3 (three) times daily as needed for dizziness.   Yes [provider]  metoprolol succinate (TOPROL-XL) 25 MG 24 hr tablet Take 25 mg by mouth daily.   Yes [provider]  pantoprazole (PROTONIX) 40 MG tablet Take 40 mg by mouth daily.   Yes [provider]  sitaGLIPtin (JANUVIA) 50 MG tablet Take 100 mg by mouth daily.   Yes [provider]     Results for orders placed or performed during the hospital encounter of 02/24/17 (from the past 48 hour(s))  Basic metabolic panel     Status: Abnormal   Collection Time: 02/24/17  2:14 AM  Result Value Ref Range   Sodium 136 135 - 145 mmol/L   Potassium 3.3 (L) 3.5 - 5.1 mmol/L   Chloride 99 (L) 101 - 111 mmol/L   CO2 27 22 - 32 mmol/L   Glucose, Bld 344 (H) 65 - 99 mg/dL   BUN 16 6 - 20 mg/dL   Creatinine, Ser 0.68 0.61 - 1.24 mg/dL   Calcium 9.6 8.9 - 10.3 mg/dL   GFR calc non Af Amer >60 >60 mL/min   GFR calc Af Amer >60 >60 mL/min  Comment: (NOTE) The eGFR has been calculated using the CKD EPI equation. This calculation has not been validated in all clinical situations. eGFR's persistently <60 mL/min signify possible Chronic Kidney Disease.    Anion gap 10 5 - 15  CBC     Status: Abnormal   Collection Time: 02/24/17  2:14 AM  Result Value Ref Range   WBC 8.9 3.8 - 10.6 K/uL   RBC 5.23 4.40 - 5.90 MIL/uL   Hemoglobin 14.0 13.0 - 18.0 g/dL   HCT 41.4 40.0 - 52.0 %   MCV 79.1 (L) 80.0 - 100.0 fL   MCH 26.8 26.0 - 34.0 pg   MCHC 33.9 32.0 - 36.0 g/dL   RDW 16.2 (H) 11.5 - 14.5 %   Platelets 203 150 - 440 K/uL  Troponin I     Status: Abnormal   Collection Time: 02/24/17  2:14 AM  Result Value Ref Range   Troponin I 0.03 (HH) <0.03 ng/mL     Comment: CRITICAL RESULT CALLED TO, READ BACK BY AND VERIFIED WITH KENNY PATEL AT 0310 ON 02/24/17 RWW   Protime-INR     Status: None   Collection Time: 02/24/17  2:14 AM  Result Value Ref Range   Prothrombin Time 13.8 11.4 - 15.2 seconds   INR 1.06   APTT     Status: None   Collection Time: 02/24/17  2:14 AM  Result Value Ref Range   aPTT 27 24 - 36 seconds   Dg Chest Portable 1 View  Result Date: 02/24/2017 CLINICAL DATA:  66 year old male awoken by chest pain shortness of breath and nausea. EXAM: PORTABLE CHEST 1 VIEW COMPARISON:  None. FINDINGS: Portable AP upright view at 0220 hours. Previous sternotomy with evidence of aortic valve replacement and CABG. Cardiomegaly. Other mediastinal contours are within normal limits. Visualized tracheal air column is within normal limits. Mild obscuration of the medial diaphragm appears likely due to cardiomegaly related hypo ventilation. Otherwise allowing for portable technique the lungs are clear. No pneumothorax or pleural effusion. IMPRESSION: Cardiomegaly.  No acute cardiopulmonary abnormality. Electronically Signed   By: Genevie Ann M.D.   On: 02/24/2017 02:37    Review of Systems  Constitutional: Negative for chills and fever.  HENT: Negative for sore throat and tinnitus.   Eyes: Negative for blurred vision and redness.  Respiratory: Negative for cough and shortness of breath.   Cardiovascular: Positive for chest pain. Negative for palpitations, orthopnea and PND.  Gastrointestinal: Positive for nausea. Negative for abdominal pain, diarrhea and vomiting.  Genitourinary: Negative for dysuria, frequency and urgency.  Musculoskeletal: Negative for joint pain and myalgias.  Skin: Negative for rash.       No lesions  Neurological: Negative for speech change, focal weakness and weakness.  Endo/Heme/Allergies: Does not bruise/bleed easily.       No temperature intolerance  Psychiatric/Behavioral: Negative for depression and suicidal ideas.     Blood pressure (!) 167/69, pulse 87, temperature 98.6 F (37 C), temperature source Oral, resp. rate 18, height _0  (1.778 m), weight 103.4 kg (228 lb), SpO2 92 %. Physical Exam  Vitals reviewed. Constitutional: He is oriented to person, place, and time. He appears well-developed and well-nourished. No distress.  HENT:  Head: Normocephalic and atraumatic.  Mouth/Throat: Oropharynx is clear and moist.  Eyes: Pupils are equal, round, and reactive to light. Conjunctivae and EOM are normal. No scleral icterus.  Neck: Normal range of motion. Neck supple. No JVD present. No tracheal deviation present. No thyromegaly present.  Cardiovascular: Normal rate and regular rhythm.  Exam reveals no gallop and no friction rub.   No murmur heard. Respiratory: Effort normal and breath sounds normal. No respiratory distress.  GI: Soft. Bowel sounds are normal. He exhibits no distension. There is no tenderness.  Genitourinary:  Genitourinary Comments: Deferred  Musculoskeletal: Normal range of motion. He exhibits no edema.  Lymphadenopathy:    He has no cervical adenopathy.  Neurological: He is alert and oriented to person, place, and time. No cranial nerve deficit.  Skin: Skin is warm and dry. No rash noted. No erythema.  Psychiatric: He has a normal mood and affect. His behavior is normal. Judgment and thought content normal.     Assessment/Plan This is a 66 year old male admitted for chest pain. 1. Chest pain: Angina secondary to demand ischemia. Blood pressure grossly elevated at presentation but is now improving. Continue to follow cardiac biomarkers. Monitor telemetry. Consult cardiology 2. CAD: Continue aspirin and Plavix.  3. Hypertension: Gradually improving; continue Nitropaste. Also continue metoprolol and lisinopril. Labetalol as needed. 4. Diabetes mellitus type 2: Blood sugar is elevated. I've given the patient a dose of basal insulin and we will continue sliding scale insulin while  hospitalized. Continue Tradjenta 5. Hyperlipidemia: 6. DVT prophylaxis: Lovenox 7. GI prophylaxis: Pantoprazole per home regimen The patient is a full code. Time spent on admission orders and patient care approximately 45 minutes  Harrie Foreman, MD 02/24/2017, 3:46 AM

## 2017-02-24 NOTE — Progress Notes (Signed)
Patient arrived to 2A Room 241. Patient denies pain and all questions answered. Patient oriented to unit and Fall Safety Plan signed. Skin assessment completed with Richardo Priest and skin intact. A&Ox4, VSS, and NSR on verified tele-box #[id number]. Nursing staff will continue to monitor for any changes in patient status. Lamonte Richer, RN

## 2017-02-24 NOTE — Progress Notes (Signed)
CRITICAL VALUE ALERT  Critical Value:  Troponin 2.71  Date & Time Notied:  02/24/2017 5:14 PM  Provider Notified: Dr Tenny Craw  Orders Received/Actions taken: Aggrastat ordered per pharmacy dosing

## 2017-02-24 NOTE — Progress Notes (Signed)
ANTICOAGULATION CONSULT NOTE - Initial Consult  Pharmacy Consult for heparin drip Indication: chest pain/ACS  Allergies  Allergen Reactions  . Metformin And Related    Patient Measurements: Height: 5\' 10"  (177.8 cm) Weight: 227 lb 9.6 oz (103.2 kg) IBW/kg (Calculated) : 73 Heparin Dosing Weight: 95 kg  Vital Signs: Temp: 98.2 F (36.8 C) (08/25 0424) Temp Source: Oral (08/25 0424) BP: 137/65 (08/25 0424) Pulse Rate: 80 (08/25 0424)  Labs:  Recent Labs  02/24/17 0214 02/24/17 0429  HGB 14.0  --   HCT 41.4  --   PLT 203  --   APTT 27  --   LABPROT 13.8  --   INR 1.06  --   CREATININE 0.68  --   TROPONINI 0.03* 0.40*    Estimated Creatinine Clearance: 112.3 mL/min (by C-G formula based on SCr of 0.68 mg/dL).   Medical History: Past Medical History:  Diagnosis Date  . CAD (coronary artery disease)   . Colitis   . Diabetes mellitus without complication ([facility])   . Diverticulitis   . Hypertension     Assessment: Pharmacy consulted to dose and monitor heparin drip in this 66 year old male for ACS. Patient was not taking anticoagulants prior to admission. Baseline labs obtained.   Goal of Therapy:  Heparin level 0.3-0.7 units/ml Monitor platelets by anticoagulation protocol: Yes   Plan:  Give 4000 units bolus x 1 Start heparin infusion at 1200 units/hr Check anti-Xa level in 6 hours and daily while on heparin Continue to monitor H&H and platelets  Cindi Carbon, PharmD, BCPS Clinical Pharmacist 02/24/2017,7:45 AM

## 2017-02-24 NOTE — ED Triage Notes (Signed)
PT to rm 26 via EMS from home, report awoken w/ CP radiating to both arms with SOB and nausea.  Pt took 324 ASA PTA and EMS applied 1inch nitro paste.

## 2017-02-24 NOTE — Progress Notes (Signed)
[facility] [place] [facility]   PATIENT NAME: Christopher Cummings    MR#:  [id number]  DATE OF BIRTH:  1951/02/22  SUBJECTIVE:  CHIEF COMPLAINT:   Chief Complaint  Patient presents with  . Chest Pain   Intermittent chest pain 2/8 on the left side, nausea but no diaphoresis or palpitation. REVIEW OF SYSTEMS:  Review of Systems  Constitutional: Negative for chills, fever and malaise/fatigue.  HENT: Negative for sore throat.   Eyes: Negative for blurred vision and double vision.  Respiratory: Negative for cough, hemoptysis, shortness of breath, wheezing and stridor.   Cardiovascular: Positive for chest pain. Negative for palpitations, orthopnea and leg swelling.  Gastrointestinal: Positive for nausea. Negative for abdominal pain, blood in stool, diarrhea, melena and vomiting.  Genitourinary: Negative for dysuria, flank pain and hematuria.  Musculoskeletal: Negative for back pain and joint pain.  Skin: Negative for rash.  Neurological: Negative for dizziness, sensory change, focal weakness, seizures, loss of consciousness, weakness and headaches.  Endo/Heme/Allergies: Negative for polydipsia.  Psychiatric/Behavioral: Negative for depression. The patient is not nervous/anxious.     DRUG ALLERGIES:   Allergies  Allergen Reactions  . Metformin And Related    VITALS:  Blood pressure 130/60, pulse (!) 59, temperature 98.1 F (36.7 C), temperature source Oral, resp. rate 20, height 5\' 10"  (1.778 m), weight 227 lb 9.6 oz (103.2 kg), SpO2 95 %. PHYSICAL EXAMINATION:  Physical Exam  Constitutional: He is oriented to person, place, and time and well-developed, well-nourished, and in no distress.  HENT:  Head: Normocephalic.  Mouth/Throat: Oropharynx is clear and moist.  Eyes: Pupils are equal, round, and reactive to light. Conjunctivae and EOM are normal. No scleral icterus.  Neck: Normal range of motion. Neck supple. No JVD present. No tracheal deviation  present.  Cardiovascular: Normal rate, regular rhythm and normal heart sounds.  Exam reveals no gallop.   No murmur heard. Pulmonary/Chest: Effort normal and breath sounds normal. No respiratory distress. He has no wheezes. He has no rales.  Abdominal: Soft. Bowel sounds are normal. He exhibits no distension. There is no tenderness. There is no rebound.  Musculoskeletal: Normal range of motion. He exhibits no edema or tenderness.  Neurological: He is alert and oriented to person, place, and time. No cranial nerve deficit.  Skin: No rash noted. No erythema.  Psychiatric: Affect normal.   LABORATORY PANEL:  Male CBC  Recent Labs Lab 02/24/17 0214  WBC 8.9  HGB 14.0  HCT 41.4  PLT 203   ------------------------------------------------------------------------------------------------------------------ Chemistries   Recent Labs Lab 02/24/17 0214 02/24/17 1017  NA 136  --   K 3.3*  --   CL 99*  --   CO2 27  --   GLUCOSE 344*  --   BUN 16  --   CREATININE 0.68  --   CALCIUM 9.6  --   MG  --  1.9   RADIOLOGY:  Dg Chest Portable 1 View  Result Date: 02/24/2017 CLINICAL DATA:  66 year old male awoken by chest pain shortness of breath and nausea. EXAM: PORTABLE CHEST 1 VIEW COMPARISON:  None. FINDINGS: Portable AP upright view at 0220 hours. Previous sternotomy with evidence of aortic valve replacement and CABG. Cardiomegaly. Other mediastinal contours are within normal limits. Visualized tracheal air column is within normal limits. Mild obscuration of the medial diaphragm appears likely due to cardiomegaly related hypo ventilation. Otherwise allowing for portable technique the lungs are clear. No pneumothorax or pleural effusion. IMPRESSION: Cardiomegaly.  No acute cardiopulmonary  abnormality. Electronically Signed   By: Odessa Fleming M.D.   On: 02/24/2017 02:37   ASSESSMENT AND PLAN:   This is a 66 year old male admitted for chest pain. 1. NSTEMI, Troponin is trending up to 1.92 on  heparin drip. Continue aspirin, Plavix, Lipitor,  continue metoprolol and lisinopril. Morphine and nitroglycerin when necessary. Cardiac cath on Monday per Dr. Tenny Craw.  2. CAD: Continue aspirin and Plavix, and statin.  3. Hypertension:  continue metoprolol and lisinopril. Labetalol as needed.  4. Diabetes mellitus continue sliding scale insulin and Continue Tradjenta 5. Hyperlipidemia: statin.  Hypokalemia. Given potassium supplement. Magnesium is normal.  All the records are reviewed and case discussed with Care Management/Social Worker. Management plans discussed with the patient, family and they are in agreement.  CODE STATUS: Full Code  TOTAL TIME TAKING CARE OF THIS PATIENT: 36 minutes.   More than 50% of the time was spent in counseling/coordination of care: YES  POSSIBLE D/C IN 2 DAYS, DEPENDING ON CLINICAL CONDITION.   Shaune Pollack M.D on 02/24/2017 at 2:55 PM  Between 7am to 6pm - Pager - [phone]  After 6pm go to [email or web address] - Social research officer, government  [facility] [place] [facility]  Office  [phone]  CC: Primary care physician; System, Pcp Not In  Note: This dictation was prepared with Dragon dictation along with smaller phrase technology. Any transcriptional errors that result from this process are unintentional.

## 2017-02-24 NOTE — Progress Notes (Signed)
Spoke with dr. Tenny Craw to make aware patient continues to have 4/10 chest pain after two Sl nitro, 4mg  morphine, patient placed on 02 at 2l due to sat ranging from 89-95. Stat ekg done. sbp 103. Per md agrees with ct angio chest/aorta. Will call md will result asap

## 2017-02-24 NOTE — Progress Notes (Signed)
Called and spoke with pharmacy to clarify if  heparin drip will continue of be discontinued since aggrastat drip was ordered. Per pharmacist the heparin drip will not be discontinued. Patient to received both heparin drip and aggrastat drip

## 2017-02-24 NOTE — Progress Notes (Addendum)
CRITICAL VALUE ALERT  Critical Value:  Trop 1.92  Date & Time Notied:  02/24/2017 11:29 AM  Provider Notified: Dr. Imogene Burn, Dr. Tenny Craw  Orders Received/Actions taken: No new orders at this time

## 2017-02-24 NOTE — Progress Notes (Addendum)
ANTICOAGULATION CONSULT NOTE - Initial Consult  Pharmacy Consult for heparin drip Indication: chest pain/ACS  Allergies  Allergen Reactions  . Metformin And Related    Patient Measurements: Height: 5\' 10"  (177.8 cm) Weight: 227 lb 9.6 oz (103.2 kg) IBW/kg (Calculated) : 73 Heparin Dosing Weight: 95 kg  Vital Signs: Temp: 98.1 F (36.7 C) (08/25 1306) Temp Source: Oral (08/25 1306) BP: 115/73 (08/25 1652) Pulse Rate: 60 (08/25 1613)  Labs:  Recent Labs  02/24/17 0214 02/24/17 0429 02/24/17 1017 02/24/17 1602  HGB 14.0  --   --   --   HCT 41.4  --   --   --   PLT 203  --   --   --   APTT 27  --   --   --   LABPROT 13.8  --   --   --   INR 1.06  --   --   --   HEPARINUNFRC  --   --   --  0.46  CREATININE 0.68  --   --   --   TROPONINI 0.03* 0.40* 1.92* 2.71*    Estimated Creatinine Clearance: 112.3 mL/min (by C-G formula based on SCr of 0.68 mg/dL).   Medical History: Past Medical History:  Diagnosis Date  . CAD (coronary artery disease)   . Colitis   . Diabetes mellitus without complication ([facility])   . Diverticulitis   . Hypertension     Assessment: Pharmacy consulted to dose and monitor heparin drip in this 66 year old male for ACS. Patient was not taking anticoagulants prior to admission. Baseline labs obtained.   Goal of Therapy:  Heparin level 0.3-0.7 units/ml Monitor platelets by anticoagulation protocol: Yes   Plan:  Give 4000 units bolus x 1 Start heparin infusion at 1200 units/hr Check anti-Xa level in 6 hours and daily while on heparin Continue to monitor H&H and platelets  8/25 16:02 heparin level therapeutic x 1. Continue current rate. Will recheck HL in 6 hours.  Carola Frost, PharmD, BCPS Clinical Pharmacist 02/24/2017,5:33 PM    8/25 2200 heparin level 0.38. Continue current regimen. Recheck heparin level and CBC with tomorrow AM labs.  8/26 AM heparin level 0.36. Continue current regimen. Recheck heparin level and CBC with  tomorrow AM labs  Fulton Reek, PharmD, BCPS  02/24/17 10:49 PM

## 2017-02-24 NOTE — Progress Notes (Addendum)
CRITICAL VALUE ALERT  Critical Value:  Troponin 0.40  Date & Time Notied:  02/24/2017 0558  Provider Notified: Sheryle Hail MD  Orders Received/Actions taken: No new orders received.   Nursing staff will continue to monitor for any changes in patient status. Lamonte Richer, RN

## 2017-02-25 ENCOUNTER — Inpatient Hospital Stay (HOSPITAL_COMMUNITY)
Admit: 2017-02-25 | Discharge: 2017-02-25 | Disposition: A | Discharge status: Home / Self Care | Payer: BLUE CROSS/BLUE SHIELD | Attending: Internal Medicine | Admitting: Internal Medicine

## 2017-02-25 DIAGNOSIS — I2510 Atherosclerotic heart disease of native coronary artery without angina pectoris: Secondary | ICD-10-CM

## 2017-02-25 LAB — CBC
HCT: 36.4 % — ABNORMAL LOW (ref 40.0–52.0)
Hemoglobin: 12.2 g/dL — ABNORMAL LOW (ref 13.0–18.0)
MCH: 27.3 pg (ref 26.0–34.0)
MCHC: 33.6 g/dL (ref 32.0–36.0)
MCV: 81.4 fL (ref 80.0–100.0)
PLATELETS: 169 10*3/uL (ref 150–440)
RBC: 4.47 MIL/uL (ref 4.40–5.90)
RDW: 16.5 % — AB (ref 11.5–14.5)
WBC: 8.5 10*3/uL (ref 3.8–10.6)

## 2017-02-25 LAB — BASIC METABOLIC PANEL
ANION GAP: 6 (ref 5–15)
BUN: 12 mg/dL (ref 6–20)
CALCIUM: 8.6 mg/dL — AB (ref 8.9–10.3)
CO2: 29 mmol/L (ref 22–32)
Chloride: 102 mmol/L (ref 101–111)
Creatinine, Ser: 0.88 mg/dL (ref 0.61–1.24)
Glucose, Bld: 197 mg/dL — ABNORMAL HIGH (ref 65–99)
Potassium: 4.1 mmol/L (ref 3.5–5.1)
Sodium: 137 mmol/L (ref 135–145)

## 2017-02-25 LAB — ECHOCARDIOGRAM COMPLETE
HEIGHTINCHES: 70 in
WEIGHTICAEL: 3728 [oz_av]

## 2017-02-25 LAB — LIPID PANEL
CHOL/HDL RATIO: 2.5 ratio
CHOLESTEROL: 89 mg/dL (ref 0–200)
HDL: 35 mg/dL — AB (ref >40)
LDL CALC: 42 mg/dL (ref 0–99)
Triglycerides: 60 mg/dL (ref <150)
VLDL: 12 mg/dL (ref 0–40)

## 2017-02-25 LAB — GLUCOSE, CAPILLARY
Glucose-Capillary: 145 mg/dL — ABNORMAL HIGH (ref 65–99)
Glucose-Capillary: 177 mg/dL — ABNORMAL HIGH (ref 65–99)
Glucose-Capillary: 159 mg/dL — ABNORMAL HIGH (ref 65–99)
Glucose-Capillary: 140 mg/dL — ABNORMAL HIGH (ref 65–99)

## 2017-02-25 LAB — HEPARIN LEVEL (UNFRACTIONATED): HEPARIN UNFRACTIONATED: 0.36 [IU]/mL (ref 0.30–0.70)

## 2017-02-25 LAB — MAGNESIUM: MAGNESIUM: 1.8 mg/dL (ref 1.7–2.4)

## 2017-02-25 MED ORDER — SALINE SPRAY 0.65 % NA SOLN
1.0000 | NASAL | Status: DC | PRN
  Filled 2017-02-25: qty 44

## 2017-02-25 NOTE — Progress Notes (Addendum)
[facility] [place] [facility]   PATIENT NAME: Christopher Cummings    MR#:  [id number]  DATE OF BIRTH:  1950/09/01  SUBJECTIVE:  CHIEF COMPLAINT:   Chief Complaint  Patient presents with  . Chest Pain   chest pain on the left side last night, no nausea, diaphoresis or palpitation. Nose bleeding and small bloody stool just now. He is put on Aggrastat drip last night. REVIEW OF SYSTEMS:  Review of Systems  Constitutional: Negative for chills, fever and malaise/fatigue.  HENT: Negative for sore throat.   Eyes: Negative for blurred vision and double vision.  Respiratory: Negative for cough, hemoptysis, shortness of breath, wheezing and stridor.   Cardiovascular: Positive for chest pain. Negative for palpitations, orthopnea and leg swelling.  Gastrointestinal: Negative for abdominal pain, blood in stool, diarrhea, melena, nausea and vomiting.  Genitourinary: Negative for dysuria, flank pain and hematuria.  Musculoskeletal: Negative for back pain and joint pain.  Skin: Negative for rash.  Neurological: Negative for dizziness, sensory change, focal weakness, seizures, loss of consciousness, weakness and headaches.  Endo/Heme/Allergies: Negative for polydipsia.  Psychiatric/Behavioral: Negative for depression. The patient is not nervous/anxious.     DRUG ALLERGIES:   Allergies  Allergen Reactions  . Metformin And Related    VITALS:  Blood pressure (!) 115/50, pulse 60, temperature 98.9 F (37.2 C), temperature source Oral, resp. rate 18, height 5\' 10"  (1.778 m), weight 233 lb (105.7 kg), SpO2 93 %. PHYSICAL EXAMINATION:  Physical Exam  Constitutional: He is oriented to person, place, and time and well-developed, well-nourished, and in no distress.  obesity  HENT:  Head: Normocephalic.  Mouth/Throat: Oropharynx is clear and moist.  Eyes: Pupils are equal, round, and reactive to light. Conjunctivae and EOM are normal. No scleral icterus.  Neck: Normal range  of motion. Neck supple. No JVD present. No tracheal deviation present.  Cardiovascular: Normal rate, regular rhythm and normal heart sounds.  Exam reveals no gallop.   No murmur heard. Pulmonary/Chest: Effort normal and breath sounds normal. No respiratory distress. He has no wheezes. He has no rales.  Abdominal: Soft. Bowel sounds are normal. He exhibits no distension. There is no tenderness. There is no rebound.  Musculoskeletal: Normal range of motion. He exhibits no edema or tenderness.  Neurological: He is alert and oriented to person, place, and time. No cranial nerve deficit.  Skin: No rash noted. No erythema.  Psychiatric: Affect normal.   LABORATORY PANEL:  Male CBC  Recent Labs Lab 02/25/17 0428  WBC 8.5  HGB 12.2*  HCT 36.4*  PLT 169   ------------------------------------------------------------------------------------------------------------------ Chemistries   Recent Labs Lab 02/25/17 0428  NA 137  K 4.1  CL 102  CO2 29  GLUCOSE 197*  BUN 12  CREATININE 0.88  CALCIUM 8.6*  MG 1.8   RADIOLOGY:  Ct Angio Chest Aorta W/cm &/or Wo/cm  Result Date: 02/24/2017 CLINICAL DATA:  Left side chest pain EXAM: CT ANGIOGRAPHY CHEST WITH CONTRAST TECHNIQUE: Multidetector CT imaging of the chest was performed using the standard protocol during bolus administration of intravenous contrast. Multiplanar CT image reconstructions and MIPs were obtained to evaluate the vascular anatomy. CONTRAST:  75 cc Isovue 370 IV COMPARISON:  None. FINDINGS: Cardiovascular: No filling defects in the pulmonary arteries to suggest pulmonary emboli. Cardiomegaly. Prior aortic valve replacement. Densely calcified native coronary vessels, prior CABG. Mediastinum/Nodes: No mediastinal, hilar, or axillary adenopathy. Lungs/Pleura: Mild vascular congestion. No confluent opacities or effusions. Upper Abdomen: Imaging into the upper abdomen shows  no acute findings. Musculoskeletal: Chest wall soft tissues  are unremarkable. No acute bony abnormality. Review of the MIP images confirms the above findings. IMPRESSION: No evidence of pulmonary embolus. Mild cardiomegaly.  Vascular congestion. Electronically Signed   By: Charlett Nose M.D.   On: 02/24/2017 16:57   ASSESSMENT AND PLAN:   This is a 66 year old male admitted for chest pain. 1. NSTEMI, Troponin is trending up to 2.71 while on heparin drip. He is put on Aggrastat drip last night. Continue aspirin, Plavix, Lipitor,  continue metoprolol and lisinopril. Morphine and nitroglycerin when necessary. CT angiogram of the chest: No PE or aortic dissection. Continue heparin and aggrastat for now and Cardiac cath tomorrow per Dr. Tenny Craw.  2. CAD: Continue aspirin and Plavix, and statin.  3. Hypertension:  continue metoprolol and lisinopril. Labetalol as needed.  4. Diabetes mellitus continue sliding scale insulin and Continue Tradjenta  5. Hyperlipidemia: statin.  Hypokalemia. Improved with potassium supplement. Magnesium is normal.  Epistaxis and bloody stool. May discontinue Aggrastat if Dr. Tenny Craw agrees. Check FOTB. Follow-up hemoglobin.  Discussed with Dr. Tenny Craw. All the records are reviewed and case discussed with Care Management/Social Worker. Management plans discussed with the patient, family and they are in agreement.  CODE STATUS: Full Code  TOTAL TIME TAKING CARE OF THIS PATIENT: 38 minutes.   More than 50% of the time was spent in counseling/coordination of care: YES  POSSIBLE D/C IN 2 DAYS, DEPENDING ON CLINICAL CONDITION.   Shaune Pollack M.D on 02/25/2017 at 12:18 PM  Between 7am to 6pm - Pager - [phone]  After 6pm go to [email or web address] - Social research officer, government  [facility] [place] [facility]  Office  [phone]  CC: Primary care physician; System, Pcp Not In  Note: This dictation was prepared with Dragon dictation along with smaller phrase technology. Any transcriptional errors that result from this process  are unintentional.

## 2017-02-25 NOTE — Progress Notes (Signed)
Pt noticed small amount of blood when blowing his nose, as well as some blood in his stool. Pages Dr. Imogene Burn & Dr. Tenny Craw and received orders to D/C Aggrastat drip and to add stool softener and saline nasal spray. Orders carried out. VSS, pt denies any discomfort or any further bleeding. Will continue to monitor.

## 2017-02-25 NOTE — Progress Notes (Signed)
*  PRELIMINARY RESULTS* Echocardiogram 2D Echocardiogram has been performed.  Christopher Cummings [name] 02/25/2017, 10:08 AM

## 2017-02-25 NOTE — Progress Notes (Signed)
Pt. Stated he got up to use urinal, after urinating he felt pressure in his abdomen running up to his head, it was a squeezing pressure. He sat back down on side of bed and called nurse. Upon assessment, blood pressure was up but all other vital were fine and pt. Was flush, skin was warm but not clammy. Pt was alert and oriented x4. PRN, morphine  was given, pt. Then pt. complained of nausea, PRN zofran was given, and 1x dose of sublingual nitro was given. Upon reassessment (5 minutes after nitro)  pt. Was stated all pain was gone and he was not nauseated any more, blood pressure decreased as well. Pt resting comfortably in bed at this time. Will continue to monitor pt.

## 2017-02-25 NOTE — Progress Notes (Signed)
Bilateral groin prepped for cath procedure in A.M..

## 2017-02-25 NOTE — Progress Notes (Signed)
Progress Note  Patient Name: Christopher Witmer Sr. Date of Encounter: 02/25/2017  Primary Cardiologist: New  Previously Dr Baird Lyons in [place]  Subjective   Had episode of chest discomfort around 2 AM  Currently pain free  Inpatient Medications    Scheduled Meds: . aspirin EC  81 mg Oral Daily  . atorvastatin  80 mg Oral Daily  . clopidogrel  75 mg Oral Daily  . docusate sodium  100 mg Oral BID  . fluticasone  1 spray Each Nare Daily  . insulin aspart  0-5 Units Subcutaneous QHS  . insulin aspart  0-9 Units Subcutaneous TID WC  . insulin glargine  8 Units Subcutaneous QHS  . linagliptin  5 mg Oral Daily  . lisinopril  10 mg Oral QHS  . metoprolol succinate  25 mg Oral Daily  . pantoprazole  40 mg Oral Daily   Continuous Infusions: . sodium chloride 75 mL/hr at 02/25/17 0458  . heparin 1,200 Units/hr (02/25/17 0008)  . tirofiban 0.15 mcg/kg/min (02/25/17 0458)   PRN Meds: acetaminophen **OR** acetaminophen, meclizine, morphine injection, nitroGLYCERIN, ondansetron **OR** ondansetron (ZOFRAN) IV   Vital Signs    Vitals:   02/24/17 1652 02/24/17 1920 02/25/17 0442 02/25/17 0452  BP: 115/73 122/65 (!) 99/45   Pulse:  65 (!) 52   Resp:  19 18   Temp:  97.8 F (36.6 C) 98 F (36.7 C)   TempSrc:  Oral    SpO2:  100% 100%   Weight:    233 lb (105.7 kg)  Height:        Intake/Output Summary (Last 24 hours) at 02/25/17 0832 Last data filed at 02/25/17 8616  Gross per 24 hour  Intake          1845.84 ml  Output              775 ml  Net          1070.84 ml   Filed Weights   02/24/17 0214 02/24/17 0424 02/25/17 0452  Weight: 228 lb (103.4 kg) 227 lb 9.6 oz (103.2 kg) 233 lb (105.7 kg)    Telemetry    SR   - Personally Reviewed  ECG    Physical Exam   GEN: No acute distress.   Neck: No JVD Cardiac: RRR, no murmurs, rubs, or gallops.  Respiratory: Clear to auscultation bilaterally. GI: Soft, nontender, non-distended  MS: No edema; No deformity. Neuro:   Nonfocal  Psych: Normal affect   Labs    Chemistry Recent Labs Lab 02/24/17 0214 02/25/17 0428  NA 136 137  K 3.3* 4.1  CL 99* 102  CO2 27 29  GLUCOSE 344* 197*  BUN 16 12  CREATININE 0.68 0.88  CALCIUM 9.6 8.6*  GFRNONAA >60 >60  GFRAA >60 >60  ANIONGAP 10 6     Hematology Recent Labs Lab 02/24/17 0214 02/25/17 0428  WBC 8.9 8.5  RBC 5.23 4.47  HGB 14.0 12.2*  HCT 41.4 36.4*  MCV 79.1* 81.4  MCH 26.8 27.3  MCHC 33.9 33.6  RDW 16.2* 16.5*  PLT 203 169    Cardiac Enzymes Recent Labs Lab 02/24/17 0214 02/24/17 0429 02/24/17 1017 02/24/17 1602  TROPONINI 0.03* 0.40* 1.92* 2.71*   No results for input(s): TROPIPOC in the last 168 hours.   BNPNo results for input(s): BNP, PROBNP in the last 168 hours.   DDimer No results for input(s): DDIMER in the last 168 hours.   Radiology    Dg Chest Portable 1  View  Result Date: 02/24/2017 CLINICAL DATA:  66 year old male awoken by chest pain shortness of breath and nausea. EXAM: PORTABLE CHEST 1 VIEW COMPARISON:  None. FINDINGS: Portable AP upright view at 0220 hours. Previous sternotomy with evidence of aortic valve replacement and CABG. Cardiomegaly. Other mediastinal contours are within normal limits. Visualized tracheal air column is within normal limits. Mild obscuration of the medial diaphragm appears likely due to cardiomegaly related hypo ventilation. Otherwise allowing for portable technique the lungs are clear. No pneumothorax or pleural effusion. IMPRESSION: Cardiomegaly.  No acute cardiopulmonary abnormality. Electronically Signed   By: Odessa Fleming M.D.   On: 02/24/2017 02:37   Ct Angio Chest Aorta W/cm &/or Wo/cm  Result Date: 02/24/2017 CLINICAL DATA:  Left side chest pain EXAM: CT ANGIOGRAPHY CHEST WITH CONTRAST TECHNIQUE: Multidetector CT imaging of the chest was performed using the standard protocol during bolus administration of intravenous contrast. Multiplanar CT image reconstructions and MIPs were  obtained to evaluate the vascular anatomy. CONTRAST:  75 cc Isovue 370 IV COMPARISON:  None. FINDINGS: Cardiovascular: No filling defects in the pulmonary arteries to suggest pulmonary emboli. Cardiomegaly. Prior aortic valve replacement. Densely calcified native coronary vessels, prior CABG. Mediastinum/Nodes: No mediastinal, hilar, or axillary adenopathy. Lungs/Pleura: Mild vascular congestion. No confluent opacities or effusions. Upper Abdomen: Imaging into the upper abdomen shows no acute findings. Musculoskeletal: Chest wall soft tissues are unremarkable. No acute bony abnormality. Review of the MIP images confirms the above findings. IMPRESSION: No evidence of pulmonary embolus. Mild cardiomegaly.  Vascular congestion. Electronically Signed   By: Charlett Nose M.D.   On: 02/24/2017 16:57    Cardiac Studies   Echo pending   Patient Profile     66 y.o. male with history of CAD (s/p CABG; s/p PCI) , AV dz (s/p AVR) admitted with CP and NSTEMI    Assessment & Plan    1  NSTEMI  Pt with trop 2.71  Intermitt pain through day/night   Continue heparin and aggrastat for now.    Plan for [facility] tomorrow  2  AV dz  S/p AVR  Echo pending   3  HL  Lipids good on current regimen   LDL 42      Signed, Dietrich Pates, MD  02/25/2017, 8:32 AM

## 2017-02-26 ENCOUNTER — Encounter
Admission: EM | Disposition: A | Discharge status: Home / Self Care | Payer: Self-pay | Source: Home / Self Care | Attending: Internal Medicine

## 2017-02-26 ENCOUNTER — Encounter: Payer: Self-pay | Admitting: Cardiovascular Disease

## 2017-02-26 DIAGNOSIS — I2510 Atherosclerotic heart disease of native coronary artery without angina pectoris: Secondary | ICD-10-CM

## 2017-02-26 HISTORY — PX: CORONARY/GRAFT ANGIOGRAPHY: CATH118237

## 2017-02-26 HISTORY — PX: CORONARY STENT INTERVENTION: CATH118234

## 2017-02-26 LAB — CBC
HCT: 37.2 % — ABNORMAL LOW (ref 40.0–52.0)
Hemoglobin: 12.3 g/dL — ABNORMAL LOW (ref 13.0–18.0)
MCH: 26.5 pg (ref 26.0–34.0)
MCHC: 33.1 g/dL (ref 32.0–36.0)
MCV: 80 fL (ref 80.0–100.0)
PLATELETS: 169 10*3/uL (ref 150–440)
RBC: 4.65 MIL/uL (ref 4.40–5.90)
RDW: 15.9 % — AB (ref 11.5–14.5)
WBC: 7.2 10*3/uL (ref 3.8–10.6)

## 2017-02-26 LAB — PROTIME-INR
INR: 1.19
PROTHROMBIN TIME: 15.2 s (ref 11.4–15.2)

## 2017-02-26 LAB — GLUCOSE, CAPILLARY
Glucose-Capillary: 112 mg/dL — ABNORMAL HIGH (ref 65–99)
Glucose-Capillary: 188 mg/dL — ABNORMAL HIGH (ref 65–99)
Glucose-Capillary: 150 mg/dL — ABNORMAL HIGH (ref 65–99)
Glucose-Capillary: 155 mg/dL — ABNORMAL HIGH (ref 65–99)

## 2017-02-26 LAB — POCT ACTIVATED CLOTTING TIME: Activated Clotting Time: 335 seconds

## 2017-02-26 LAB — HEPARIN LEVEL (UNFRACTIONATED): Heparin Unfractionated: 0.39 IU/mL (ref 0.30–0.70)

## 2017-02-26 LAB — OCCULT BLOOD X 1 CARD TO LAB, STOOL: Fecal Occult Bld: POSITIVE — AB

## 2017-02-26 SURGERY — CORONARY STENT INTERVENTION
Anesthesia: Moderate Sedation

## 2017-02-26 MED ORDER — SODIUM CHLORIDE 0.9 % IV SOLN
INTRAVENOUS | Status: DC
  Administered 2017-02-26: 05:00:00 via INTRAVENOUS

## 2017-02-26 MED ORDER — SODIUM CHLORIDE 0.9 % IV SOLN
INTRAVENOUS | Status: AC
  Administered 2017-02-26 (×2): via INTRAVENOUS

## 2017-02-26 MED ORDER — SODIUM CHLORIDE 0.9% FLUSH: 3.0000 mL | INTRAVENOUS | Status: DC | PRN

## 2017-02-26 MED ORDER — NITROGLYCERIN 5 MG/ML IV SOLN
INTRAVENOUS | Status: AC
  Filled 2017-02-26: qty 10

## 2017-02-26 MED ORDER — ASPIRIN 81 MG PO CHEW: 81.0000 mg | CHEWABLE_TABLET | ORAL | Status: DC

## 2017-02-26 MED ORDER — SODIUM CHLORIDE 0.9% FLUSH
3.0000 mL | Freq: Two times a day (BID) | INTRAVENOUS | Status: DC
  Administered 2017-02-26: 3 mL via INTRAVENOUS

## 2017-02-26 MED ORDER — CLOPIDOGREL BISULFATE 75 MG PO TABS
ORAL_TABLET | ORAL | Status: AC
  Filled 2017-02-26: qty 4

## 2017-02-26 MED ORDER — NITROGLYCERIN 1 MG/10 ML FOR IR/CATH LAB
INTRA_ARTERIAL | Status: DC | PRN
  Administered 2017-02-26: 200 ug via INTRACORONARY
  Administered 2017-02-26: 100 ug via INTRACORONARY
  Administered 2017-02-26: 200 ug via INTRACORONARY

## 2017-02-26 MED ORDER — SODIUM CHLORIDE 0.9% FLUSH
3.0000 mL | Freq: Two times a day (BID) | INTRAVENOUS | Status: DC
  Administered 2017-02-26 (×2): 3 mL via INTRAVENOUS

## 2017-02-26 MED ORDER — SODIUM CHLORIDE 0.9% FLUSH: 3.0000 mL | Freq: Two times a day (BID) | INTRAVENOUS | Status: DC

## 2017-02-26 MED ORDER — HEPARIN (PORCINE) IN NACL 2-0.9 UNIT/ML-% IJ SOLN
INTRAMUSCULAR | Status: AC
  Filled 2017-02-26: qty 1000

## 2017-02-26 MED ORDER — VERAPAMIL HCL 2.5 MG/ML IV SOLN
INTRAVENOUS | Status: AC
  Filled 2017-02-26: qty 2

## 2017-02-26 MED ORDER — VERAPAMIL HCL 2.5 MG/ML IV SOLN
INTRAVENOUS | Status: DC | PRN
  Administered 2017-02-26: 2.5 mg via INTRA_ARTERIAL

## 2017-02-26 MED ORDER — ASPIRIN 81 MG PO CHEW
CHEWABLE_TABLET | ORAL | Status: DC | PRN
  Administered 2017-02-26: 243 mg via ORAL

## 2017-02-26 MED ORDER — FENTANYL CITRATE (PF) 100 MCG/2ML IJ SOLN
INTRAMUSCULAR | Status: AC
  Filled 2017-02-26: qty 2

## 2017-02-26 MED ORDER — HEPARIN SODIUM (PORCINE) 1000 UNIT/ML IJ SOLN
INTRAMUSCULAR | Status: AC
  Filled 2017-02-26: qty 1

## 2017-02-26 MED ORDER — SODIUM CHLORIDE 0.9 % IV SOLN: 250.0000 mL | INTRAVENOUS | Status: DC | PRN

## 2017-02-26 MED ORDER — ASPIRIN 81 MG PO CHEW
CHEWABLE_TABLET | ORAL | Status: AC
  Filled 2017-02-26: qty 3

## 2017-02-26 MED ORDER — ASPIRIN 81 MG PO CHEW
81.0000 mg | CHEWABLE_TABLET | ORAL | Status: AC
  Administered 2017-02-26: 81 mg via ORAL
  Filled 2017-02-26: qty 1

## 2017-02-26 MED ORDER — MIDAZOLAM HCL 2 MG/2ML IJ SOLN
INTRAMUSCULAR | Status: AC
  Filled 2017-02-26: qty 2

## 2017-02-26 MED ORDER — LIDOCAINE HCL (PF) 1 % IJ SOLN
INTRAMUSCULAR | Status: DC | PRN
Start: 2017-02-26 — End: 2017-02-26
  Administered 2017-02-26: 3 mL via INTRADERMAL

## 2017-02-26 MED ORDER — LIDOCAINE HCL (PF) 1 % IJ SOLN
INTRAMUSCULAR | Status: AC
  Filled 2017-02-26: qty 30

## 2017-02-26 MED ORDER — CLOPIDOGREL BISULFATE 75 MG PO TABS
ORAL_TABLET | ORAL | Status: DC | PRN
  Administered 2017-02-26: 300 mg via ORAL

## 2017-02-26 MED ORDER — SODIUM CHLORIDE 0.9 % IV SOLN: INTRAVENOUS | Status: DC

## 2017-02-26 MED ORDER — IOPAMIDOL (ISOVUE-300) INJECTION 61%
INTRAVENOUS | Status: DC | PRN
  Administered 2017-02-26: 130 mL via INTRA_ARTERIAL

## 2017-02-26 MED ORDER — FENTANYL CITRATE (PF) 100 MCG/2ML IJ SOLN
INTRAMUSCULAR | Status: DC | PRN
  Administered 2017-02-26: 25 ug via INTRAVENOUS

## 2017-02-26 MED ORDER — HEPARIN SODIUM (PORCINE) 1000 UNIT/ML IJ SOLN
INTRAMUSCULAR | Status: DC | PRN
  Administered 2017-02-26: 5000 [IU] via INTRAVENOUS
  Administered 2017-02-26: 3000 [IU] via INTRAVENOUS

## 2017-02-26 MED ORDER — MIDAZOLAM HCL 2 MG/2ML IJ SOLN
INTRAMUSCULAR | Status: DC | PRN
  Administered 2017-02-26: 1 mg via INTRAVENOUS

## 2017-02-26 SURGICAL SUPPLY — 14 items
BALLN ANGIOSCULPT RX 2.5X10 (BALLOONS) ×2
BALLN MINITREK RX 2.0X12 (BALLOONS) ×2
BALLOON ANGIOSCULPT RX 2.5X10 (BALLOONS) ×1 IMPLANT
BALLOON MINITREK RX 2.0X12 (BALLOONS) ×1 IMPLANT
CATH 5F 110X4 TIG (CATHETERS) ×2 IMPLANT
CATH INFINITI JR4 5F (CATHETERS) ×2 IMPLANT
CATH VISTA GUIDE 6FR XB3 (CATHETERS) ×2 IMPLANT
DEVICE INFLAT 30 PLUS (MISCELLANEOUS) ×2 IMPLANT
DEVICE RAD TR BAND REGULAR (VASCULAR PRODUCTS) ×2 IMPLANT
GLIDESHEATH SLEND SS 6F .021 (SHEATH) ×2 IMPLANT
KIT MANI 3VAL PERCEP (MISCELLANEOUS) ×2 IMPLANT
PACK CARDIAC CATH (CUSTOM PROCEDURE TRAY) ×2 IMPLANT
WIRE ROSEN-J .035X260CM (WIRE) ×2 IMPLANT
WIRE RUNTHROUGH .014X180CM (WIRE) ×2 IMPLANT

## 2017-02-26 NOTE — H&P (View-Only) (Signed)
 Progress Note  Patient Name: Christopher Rhome Sr. Date of Encounter: 02/25/2017  Primary Cardiologist: New  Previously Dr Casey in [place]  Subjective   Had episode of chest discomfort around 2 AM  Currently pain free  Inpatient Medications    Scheduled Meds: . aspirin EC  81 mg Oral Daily  . atorvastatin  80 mg Oral Daily  . clopidogrel  75 mg Oral Daily  . docusate sodium  100 mg Oral BID  . fluticasone  1 spray Each Nare Daily  . insulin aspart  0-5 Units Subcutaneous QHS  . insulin aspart  0-9 Units Subcutaneous TID WC  . insulin glargine  8 Units Subcutaneous QHS  . linagliptin  5 mg Oral Daily  . lisinopril  10 mg Oral QHS  . metoprolol succinate  25 mg Oral Daily  . pantoprazole  40 mg Oral Daily   Continuous Infusions: . sodium chloride 75 mL/hr at 02/25/17 0458  . heparin 1,200 Units/hr (02/25/17 0008)  . tirofiban 0.15 mcg/kg/min (02/25/17 0458)   PRN Meds: acetaminophen **OR** acetaminophen, meclizine, morphine injection, nitroGLYCERIN, ondansetron **OR** ondansetron (ZOFRAN) IV   Vital Signs    Vitals:   02/24/17 1652 02/24/17 1920 02/25/17 0442 02/25/17 0452  BP: 115/73 122/65 (!) 99/45   Pulse:  65 (!) 52   Resp:  19 18   Temp:  97.8 F (36.6 C) 98 F (36.7 C)   TempSrc:  Oral    SpO2:  100% 100%   Weight:    233 lb (105.7 kg)  Height:        Intake/Output Summary (Last 24 hours) at 02/25/17 0832 Last data filed at 02/25/17 0628  Gross per 24 hour  Intake          1845.84 ml  Output              775 ml  Net          1070.84 ml   Filed Weights   02/24/17 0214 02/24/17 0424 02/25/17 0452  Weight: 228 lb (103.4 kg) 227 lb 9.6 oz (103.2 kg) 233 lb (105.7 kg)    Telemetry    SR   - Personally Reviewed  ECG    Physical Exam   GEN: No acute distress.   Neck: No JVD Cardiac: RRR, no murmurs, rubs, or gallops.  Respiratory: Clear to auscultation bilaterally. GI: Soft, nontender, non-distended  MS: No edema; No deformity. Neuro:   Nonfocal  Psych: Normal affect   Labs    Chemistry Recent Labs Lab 02/24/17 0214 02/25/17 0428  NA 136 137  K 3.3* 4.1  CL 99* 102  CO2 27 29  GLUCOSE 344* 197*  BUN 16 12  CREATININE 0.68 0.88  CALCIUM 9.6 8.6*  GFRNONAA >60 >60  GFRAA >60 >60  ANIONGAP 10 6     Hematology Recent Labs Lab 02/24/17 0214 02/25/17 0428  WBC 8.9 8.5  RBC 5.23 4.47  HGB 14.0 12.2*  HCT 41.4 36.4*  MCV 79.1* 81.4  MCH 26.8 27.3  MCHC 33.9 33.6  RDW 16.2* 16.5*  PLT 203 169    Cardiac Enzymes Recent Labs Lab 02/24/17 0214 02/24/17 0429 02/24/17 1017 02/24/17 1602  TROPONINI 0.03* 0.40* 1.92* 2.71*   No results for input(s): TROPIPOC in the last 168 hours.   BNPNo results for input(s): BNP, PROBNP in the last 168 hours.   DDimer No results for input(s): DDIMER in the last 168 hours.   Radiology    Dg Chest Portable 1   View  Result Date: 02/24/2017 CLINICAL DATA:  66-year-old male awoken by chest pain shortness of breath and nausea. EXAM: PORTABLE CHEST 1 VIEW COMPARISON:  None. FINDINGS: Portable AP upright view at 0220 hours. Previous sternotomy with evidence of aortic valve replacement and CABG. Cardiomegaly. Other mediastinal contours are within normal limits. Visualized tracheal air column is within normal limits. Mild obscuration of the medial diaphragm appears likely due to cardiomegaly related hypo ventilation. Otherwise allowing for portable technique the lungs are clear. No pneumothorax or pleural effusion. IMPRESSION: Cardiomegaly.  No acute cardiopulmonary abnormality. Electronically Signed   By: H  Hall M.D.   On: 02/24/2017 02:37   Ct Angio Chest Aorta W/cm &/or Wo/cm  Result Date: 02/24/2017 CLINICAL DATA:  Left side chest pain EXAM: CT ANGIOGRAPHY CHEST WITH CONTRAST TECHNIQUE: Multidetector CT imaging of the chest was performed using the standard protocol during bolus administration of intravenous contrast. Multiplanar CT image reconstructions and MIPs were  obtained to evaluate the vascular anatomy. CONTRAST:  75 cc Isovue 370 IV COMPARISON:  None. FINDINGS: Cardiovascular: No filling defects in the pulmonary arteries to suggest pulmonary emboli. Cardiomegaly. Prior aortic valve replacement. Densely calcified native coronary vessels, prior CABG. Mediastinum/Nodes: No mediastinal, hilar, or axillary adenopathy. Lungs/Pleura: Mild vascular congestion. No confluent opacities or effusions. Upper Abdomen: Imaging into the upper abdomen shows no acute findings. Musculoskeletal: Chest wall soft tissues are unremarkable. No acute bony abnormality. Review of the MIP images confirms the above findings. IMPRESSION: No evidence of pulmonary embolus. Mild cardiomegaly.  Vascular congestion. Electronically Signed   By: Kevin  Dover M.D.   On: 02/24/2017 16:57    Cardiac Studies   Echo pending   Patient Profile     66 y.o. male with history of CAD (s/p CABG; s/p PCI) , AV dz (s/p AVR) admitted with CP and NSTEMI    Assessment & Plan    1  NSTEMI  Pt with trop 2.71  Intermitt pain through day/night   Continue heparin and aggrastat for now.    Plan for LHC tomorrow  2  AV dz  S/p AVR  Echo pending   3  HL  Lipids good on current regimen   LDL 42      Signed, Shekina Cordell, MD  02/25/2017, 8:32 AM    

## 2017-02-26 NOTE — Progress Notes (Signed)
Right wrist prepped for AM cath.

## 2017-02-26 NOTE — Plan of Care (Signed)
Problem: Activity: Goal: Ability to tolerate increased activity will improve Outcome: Not Progressing Pt. Experienced hypertensive episode with abdominal pain and headache after walking to the bathroom

## 2017-02-26 NOTE — Progress Notes (Signed)
[facility] - [place] at [facility]   PATIENT NAME: Christopher Cummings    MR#:  [id number]  DATE OF BIRTH:  06-22-1951  SUBJECTIVE:  CHIEF COMPLAINT:   Chief Complaint  Patient presents with  . Chest Pain   No chest pain, nausea, diaphoresis or palpitation. No nose bleeding or bloody stool. S/p LHC today. REVIEW OF SYSTEMS:  Review of Systems  Constitutional: Negative for chills, fever and malaise/fatigue.  HENT: Negative for sore throat.   Eyes: Negative for blurred vision and double vision.  Respiratory: Negative for cough, hemoptysis, shortness of breath, wheezing and stridor.   Cardiovascular: Negative for chest pain, palpitations, orthopnea and leg swelling.  Gastrointestinal: Negative for abdominal pain, blood in stool, diarrhea, melena, nausea and vomiting.  Genitourinary: Negative for dysuria, flank pain and hematuria.  Musculoskeletal: Negative for back pain and joint pain.  Skin: Negative for rash.  Neurological: Negative for dizziness, sensory change, focal weakness, seizures, loss of consciousness, weakness and headaches.  Endo/Heme/Allergies: Negative for polydipsia.  Psychiatric/Behavioral: Negative for depression. The patient is not nervous/anxious.     DRUG ALLERGIES:   Allergies  Allergen Reactions  . Metformin And Related    VITALS:  Blood pressure (!) 132/56, pulse (!) 58, temperature 98 F (36.7 C), temperature source Oral, resp. rate 16, height 5\' 10"  (1.778 m), weight 232 lb 11.2 oz (105.6 kg), SpO2 97 %. PHYSICAL EXAMINATION:  Physical Exam  Constitutional: He is oriented to person, place, and time and well-developed, well-nourished, and in no distress.  obesity  HENT:  Head: Normocephalic.  Mouth/Throat: Oropharynx is clear and moist.  Eyes: Pupils are equal, round, and reactive to light. Conjunctivae and EOM are normal. No scleral icterus.  Neck: Normal range of motion. Neck supple. No JVD present. No tracheal deviation present.   Cardiovascular: Normal rate, regular rhythm and normal heart sounds.  Exam reveals no gallop.   No murmur heard. Pulmonary/Chest: Effort normal and breath sounds normal. No respiratory distress. He has no wheezes. He has no rales.  Abdominal: Soft. Bowel sounds are normal. He exhibits no distension. There is no tenderness. There is no rebound.  Musculoskeletal: Normal range of motion. He exhibits no edema or tenderness.  Neurological: He is alert and oriented to person, place, and time. No cranial nerve deficit.  Skin: No rash noted. No erythema.  Psychiatric: Affect normal.   LABORATORY PANEL:  Male CBC  Recent Labs Lab 02/26/17 0426  WBC 7.2  HGB 12.3*  HCT 37.2*  PLT 169   ------------------------------------------------------------------------------------------------------------------ Chemistries   Recent Labs Lab 02/25/17 0428  NA 137  K 4.1  CL 102  CO2 29  GLUCOSE 197*  BUN 12  CREATININE 0.88  CALCIUM 8.6*  MG 1.8   RADIOLOGY:  No results found. ASSESSMENT AND PLAN:   This is a 66 year old male admitted for chest pain. 1. NSTEMI, Significant underlying three-vessel coronary artery disease  Troponin is trending up to 2.71 while on heparin drip. He was put on Aggrastat drip. H has been treated with aspirin, Plavix, Lipitor, metoprolol and lisinopril. Morphine and nitroglycerin when necessary. CT angiogram of the chest: No PE or aortic dissection. Off heparin and aggrastat drip. Cardiac cath: Significant underlying three-vessel coronary artery disease. Per Dr. Kirke Corin, continue indefinite dual antiplatelet therapy. Continue aggressive treatment of risk factors.  2. CAD: Continue aspirin and Plavix, and statin.  3. Hypertension:  continue metoprolol and lisinopril. Labetalol as needed.  4. Diabetes mellitus continue sliding scale insulin and Continue Tradjenta  5. Hyperlipidemia: statin.  Hypokalemia. Improved with potassium supplement. Magnesium is  normal.  Epistaxis and bloody stool. Discontinued Aggrastat and heparin drip. Positive FOTB but stable hemoglobin.   Discussed with Dr. Kirke Corin. All the records are reviewed and case discussed with Care Management/Social Worker. Management plans discussed with the patient, family and they are in agreement.  CODE STATUS: Full Code  TOTAL TIME TAKING CARE OF THIS PATIENT: 32 minutes.   More than 50% of the time was spent in counseling/coordination of care: YES  POSSIBLE D/C IN 1-2 DAYS, DEPENDING ON CLINICAL CONDITION.   Shaune Pollack M.D on 02/26/2017 at 5:18 PM  Between 7am to 6pm - Pager - [phone]  After 6pm go to [email or web address] - Social research officer, government  [facility] [place] [facility]  Office  [phone]  CC: Primary care physician; System, Pcp Not In  Note: This dictation was prepared with Dragon dictation along with smaller phrase technology. Any transcriptional errors that result from this process are unintentional.

## 2017-02-26 NOTE — Interval H&P Note (Signed)
Cath Lab Visit (complete for each Cath Lab visit)  Clinical Evaluation Leading to the Procedure:   ACS: Yes.    Non-ACS:  n/a    History and Physical Interval Note:  02/26/2017 8:44 AM  Christopher Else Sr.  has presented today for surgery, with the diagnosis of NSTEMI  The various methods of treatment have been discussed with the patient and family. After consideration of risks, benefits and other options for treatment, the patient has consented to  Procedure(s): LEFT HEART CATH AND CORONARY ANGIOGRAPHY (N/A) as a surgical intervention .  The patient's history has been reviewed, patient examined, no change in status, stable for surgery.  I have reviewed the patient's chart and labs.  Questions were answered to the patient's satisfaction.     Lorine Bears

## 2017-02-26 NOTE — Progress Notes (Signed)
Dr. Sheryle Hail notified of patient positive fecal occult values, no orders at this time.

## 2017-02-26 NOTE — Progress Notes (Signed)
ANTICOAGULATION CONSULT NOTE - Initial Consult  Pharmacy Consult for heparin drip Indication: chest pain/ACS  Allergies  Allergen Reactions  . Metformin And Related    Patient Measurements: Height: 5\' 10"  (177.8 cm) Weight: 232 lb 11.2 oz (105.6 kg) IBW/kg (Calculated) : 73 Heparin Dosing Weight: 95 kg  Vital Signs: Temp: 97.8 F (36.6 C) (08/27 0407) Temp Source: Oral (08/27 0407) BP: 114/76 (08/27 0407) Pulse Rate: 58 (08/27 0407)  Labs:  Recent Labs  02/24/17 0214 02/24/17 0429 02/24/17 1017  02/24/17 1602 02/24/17 2200 02/25/17 0428 02/25/17 0604 02/26/17 0426  HGB 14.0  --   --   --   --   --  12.2*  --  12.3*  HCT 41.4  --   --   --   --   --  36.4*  --  37.2*  PLT 203  --   --   --   --   --  169  --  169  APTT 27  --   --   --   --   --   --   --   --   LABPROT 13.8  --   --   --   --   --   --   --  15.2  INR 1.06  --   --   --   --   --   --   --  1.19  HEPARINUNFRC  --   --   --   < > 0.46 0.38  --  0.36 0.39  CREATININE 0.68  --   --   --   --   --  0.88  --   --   TROPONINI 0.03* 0.40* 1.92*  --  2.71*  --   --   --   --   < > = values in this interval not displayed.  Estimated Creatinine Clearance: 103.2 mL/min (by C-G formula based on SCr of 0.88 mg/dL).   Medical History: Past Medical History:  Diagnosis Date  . CAD (coronary artery disease)   . Colitis   . Diabetes mellitus without complication (HCC)   . Diverticulitis   . Hypertension     Assessment: Pharmacy consulted to dose and monitor heparin drip in this 66 year old male for ACS. Patient was not taking anticoagulants prior to admission. Baseline labs obtained.   Goal of Therapy:  Heparin level 0.3-0.7 units/ml Monitor platelets by anticoagulation protocol: Yes   Plan:  Give 4000 units bolus x 1 Start heparin infusion at 1200 units/hr Check anti-Xa level in 6 hours and daily while on heparin Continue to monitor H&H and platelets  8/25 16:02 heparin level therapeutic x 1.  Continue current rate. Will recheck HL in 6 hours.  Monroe Toure S, PharmD, BCPS Clinical Pharmacist 02/26/2017,5:34 AM    8/25 2200 heparin level 0.38. Continue current regimen. Recheck heparin level and CBC with tomorrow AM labs.  8/26 AM heparin level 0.36. Continue current regimen. Recheck heparin level and CBC with tomorrow AM labs  8/27 AM heparin level 0.39. Continue current regimen. Recheck heparin level and CBC with tomorrow AM labs  Fulton Reek, PharmD, BCPS  02/26/17 5:34 AM

## 2017-02-27 DIAGNOSIS — I214 Non-ST elevation (NSTEMI) myocardial infarction: Principal | ICD-10-CM

## 2017-02-27 LAB — BASIC METABOLIC PANEL
ANION GAP: 6 (ref 5–15)
BUN: 12 mg/dL (ref 6–20)
CALCIUM: 8.8 mg/dL — AB (ref 8.9–10.3)
CO2: 29 mmol/L (ref 22–32)
CREATININE: 0.67 mg/dL (ref 0.61–1.24)
Chloride: 103 mmol/L (ref 101–111)
GFR calc non Af Amer: >60 mL/min (ref >60)
Glucose, Bld: 112 mg/dL — ABNORMAL HIGH (ref 65–99)
Potassium: 3.7 mmol/L (ref 3.5–5.1)
SODIUM: 138 mmol/L (ref 135–145)

## 2017-02-27 LAB — CBC
HEMATOCRIT: 35.3 % — AB (ref 40.0–52.0)
HEMOGLOBIN: 11.9 g/dL — AB (ref 13.0–18.0)
MCH: 26.9 pg (ref 26.0–34.0)
MCHC: 33.7 g/dL (ref 32.0–36.0)
MCV: 79.8 fL — ABNORMAL LOW (ref 80.0–100.0)
Platelets: 167 10*3/uL (ref 150–440)
RBC: 4.43 MIL/uL (ref 4.40–5.90)
RDW: 16.2 % — AB (ref 11.5–14.5)
WBC: 7.1 10*3/uL (ref 3.8–10.6)

## 2017-02-27 LAB — GLUCOSE, CAPILLARY: Glucose-Capillary: 106 mg/dL — ABNORMAL HIGH (ref 65–99)

## 2017-02-27 MED ORDER — NITROGLYCERIN 0.4 MG SL SUBL: 0.4000 mg | SUBLINGUAL_TABLET | SUBLINGUAL | 2 refills | Status: DC | PRN

## 2017-02-27 NOTE — Plan of Care (Signed)
Problem: Health Behavior/Discharge Planning: Goal: Ability to safely manage health-related needs after discharge will improve Outcome: Progressing RN will review all discharge medications and follow-up appointment information with patient. RN will answer questions and ensure patient understands instructions.

## 2017-02-27 NOTE — Discharge Summary (Signed)
[facility] [place] at [facility]   PATIENT NAME: Christopher Cummings    MR#:  [id number]  DATE OF BIRTH:  03-09-1951  DATE OF ADMISSION:  02/24/2017   ADMITTING PHYSICIAN: Arnaldo Natal, MD  DATE OF DISCHARGE: 02/27/2017 10:50 AM  PRIMARY CARE PHYSICIAN: System, Pcp Not In   ADMISSION DIAGNOSIS:  Chest pain, unspecified type [R07.9] DISCHARGE DIAGNOSIS:  Active Problems:   Chest pain   NSTEMI (non-ST elevated myocardial infarction) (HCC)  SECONDARY DIAGNOSIS:   Past Medical History:  Diagnosis Date  . CAD (coronary artery disease)   . Colitis   . Diabetes mellitus without complication (HCC)   . Diverticulitis   . Hypertension    HOSPITAL COURSE:   This is a 66 year old male admitted for chest pain. 1. NSTEMI, Significant underlying three-vessel coronary artery disease  Troponin trended up to 2.71 while on heparin drip. He was put on Aggrastat drip. He has been treated with aspirin, Plavix, Lipitor, metoprolol and lisinopril. Morphine and nitroglycerin when necessary. CT angiogram of the chest: No PE or aortic dissection. Off heparin and aggrastat drip. Cardiac cath: Significant underlying three-vessel coronary artery disease. Per Dr. Kirke Corin, continue indefinite dual antiplatelet therapy. Continue aggressive treatment of risk factors.  2. CAD: Continue aspirin and Plavix, and statin.  3. Hypertension:  continue metoprolol and lisinopril. Labetalol as needed.  4. Diabetes mellitus continue sliding scale insulin and Continue Tradjenta  5. Hyperlipidemia: statin.  Hypokalemia. Improved with potassium supplement. Magnesium is normal.  Epistaxis and bloody stool. Discontinued Aggrastat and heparin drip. Positive FOTB but stable hemoglobin. Watch for any bleeding, follow-up hemoglobin with PCP or cardiologist.  Discussed with cardiology PA Mr. Shea Evans.  DISCHARGE CONDITIONS:  Stable, discharge to home today. CONSULTS OBTAINED:  Treatment  Team:  Pricilla Riffle, MD DRUG ALLERGIES:   Allergies  Allergen Reactions  . Metformin And Related    DISCHARGE MEDICATIONS:   Allergies as of 02/27/2017      Reactions   Metformin And Related       Medication List    TAKE these medications   aspirin EC 81 MG tablet Take 81 mg by mouth daily.   atorvastatin 80 MG tablet Commonly known as:  LIPITOR Take 80 mg by mouth daily.   clopidogrel 75 MG tablet Commonly known as:  PLAVIX Take 75 mg by mouth daily.   fluticasone 50 MCG/ACT nasal spray Commonly known as:  FLONASE Place 1 spray into both nostrils daily.   lisinopril 10 MG tablet Commonly known as:  PRINIVIL,ZESTRIL Take 10 mg by mouth at bedtime.   meclizine 25 MG tablet Commonly known as:  ANTIVERT Take 25 mg by mouth 3 (three) times daily as needed for dizziness.   metoprolol succinate 25 MG 24 hr tablet Commonly known as:  TOPROL-XL Take 25 mg by mouth daily.   nitroGLYCERIN 0.4 MG SL tablet Commonly known as:  NITROSTAT Place 1 tablet (0.4 mg total) under the tongue every 5 (five) minutes as needed for chest pain.   pantoprazole 40 MG tablet Commonly known as:  PROTONIX Take 40 mg by mouth daily.   sitaGLIPtin 50 MG tablet Commonly known as:  JANUVIA Take 100 mg by mouth daily.            Discharge Care Instructions        Start     Ordered   02/27/17 0000  nitroGLYCERIN (NITROSTAT) 0.4 MG SL tablet  Every 5 min PRN     02/27/17 0830  02/27/17 0000  Increase activity slowly     02/27/17 0831   02/27/17 0000  Diet - low sodium heart healthy     02/27/17 0831   02/26/17 0000  AMB Referral to Cardiac Rehabilitation - Phase II    Question Answer Comment  Diagnosis: NSTEMI   Diagnosis: PTCA      02/26/17 9604       DISCHARGE INSTRUCTIONS:  See AVS.  If you experience worsening of your admission symptoms, develop shortness of breath, life threatening emergency, suicidal or homicidal thoughts you must seek medical attention  immediately by calling 911 or calling your MD immediately  if symptoms less severe.  You Must read complete instructions/literature along with all the possible adverse reactions/side effects for all the Medicines you take and that have been prescribed to you. Take any new Medicines after you have completely understood and accpet all the possible adverse reactions/side effects.   Please note  You were cared for by a hospitalist during your hospital stay. If you have any questions about your discharge medications or the care you received while you were in the hospital after you are discharged, you can call the unit and asked to speak with the hospitalist on call if the hospitalist that took care of you is not available. Once you are discharged, your primary care physician will handle any further medical issues. Please note that NO REFILLS for any discharge medications will be authorized once you are discharged, as it is imperative that you return to your primary care physician (or establish a relationship with a primary care physician if you do not have one) for your aftercare needs so that they can reassess your need for medications and monitor your lab values.    On the day of Discharge:  VITAL SIGNS:  Blood pressure (!) 144/73, pulse 63, temperature 98.1 F (36.7 C), temperature source Oral, resp. rate 16, height 5\' 10"  (1.778 m), weight 232 lb (105.2 kg), SpO2 97 %. PHYSICAL EXAMINATION:  GENERAL:  66 y.o.-year-old patient lying in the bed with no acute distress. Obesity. EYES: Pupils equal, round, reactive to light and accommodation. No scleral icterus. Extraocular muscles intact.  HEENT: Head atraumatic, normocephalic. Oropharynx and nasopharynx clear.  NECK:  Supple, no jugular venous distention. No thyroid enlargement, no tenderness.  LUNGS: Normal breath sounds bilaterally, no wheezing, rales,rhonchi or crepitation. No use of accessory muscles of respiration.  CARDIOVASCULAR: S1, S2  normal. No murmurs, rubs, or gallops.  ABDOMEN: Soft, non-tender, non-distended. Bowel sounds present. No organomegaly or mass.  EXTREMITIES: No pedal edema, cyanosis, or clubbing.  NEUROLOGIC: Cranial nerves II through XII are intact. Muscle strength 5/5 in all extremities. Sensation intact. Gait not checked.  PSYCHIATRIC: The patient is alert and oriented x 3.  SKIN: No obvious rash, lesion, or ulcer.  DATA REVIEW:   CBC  Recent Labs Lab 02/27/17 0445  WBC 7.1  HGB 11.9*  HCT 35.3*  PLT 167    Chemistries   Recent Labs Lab 02/25/17 0428 02/27/17 0445  NA 137 138  K 4.1 3.7  CL 102 103  CO2 29 29  GLUCOSE 197* 112*  BUN 12 12  CREATININE 0.88 0.67  CALCIUM 8.6* 8.8*  MG 1.8  --      Microbiology Results  No results found for this or any previous visit.  RADIOLOGY:  No results found.   Management plans discussed with the patient, family and they are in agreement.  CODE STATUS: Full Code   TOTAL TIME  TAKING CARE OF THIS PATIENT: 32 minutes.    Shaune Pollack M.D on 02/27/2017 at 1:31 PM  Between 7am to 6pm - Pager - [phone]  After 6pm go to [email or web address] - Social research officer, government  [facility] [place] [facility]  Office  [phone]  CC: Primary care physician; System, Pcp Not In   Note: This dictation was prepared with Dragon dictation along with smaller phrase technology. Any transcriptional errors that result from this process are unintentional.

## 2017-02-27 NOTE — Plan of Care (Signed)
Problem: Pain Managment: Goal: General experience of comfort will improve Outcome: Completed/Met Date Met: 02/27/17 No complaints of pain this shift,will continue to monitor.  Problem: Bowel/Gastric: Goal: Will not experience complications related to bowel motility Outcome: Completed/Met Date Met: 02/27/17 Last BM 8/27  Problem: Cardiovascular: Goal: Vascular access site(s) Level 0-1 will be maintained Outcome: Completed/Met Date Met: 02/27/17 Left wrist cath site, clean dry & without bruising or bleeding, at a level 0, will continue to monitor.   

## 2017-02-27 NOTE — Progress Notes (Signed)
Patient referred to Cardiac Rehab with dx of NSTEMI; S/P PTCA.  Patient has hx CABG x 1 and Aortic Valve Replacement in the past, nonsmoker, DM, HTN.  Cardiac Cath performed on 02/26/2017 which revealed significant 3 vessel disease, patent LIMA to LAD, significant in-stent re-stenosis (ISR) in the proximal and distal left circumflex.  Successful balloon angioplasty of the proximal (70% restenosis reduced to 15%) and distal left circumflex in-stent restenosis (ISR) (95% restenosis reduced to 30%).  Plan per cardiologists is to continue aggressive treatment of risk factors.  Reviewed with patient the results of his cardiac cath and EF on echo.  Left ventricle systolic function grossly normal. Booklet, "Angiolplasty and Stents," given and reviewed with patient.  Diet / Nutrition discussed.  Dietitian Consult placed yesterday for diet education/review.   Patient informed of consult.  Along with diet, this RN stressed the importance of controlling BP, Cholesterol, and Diabetes.  Rationale for specific cardiac medciations reviewed with patient.  Patient is on ASA, Plavix, Atorvastatin, Zestril, Metoprolol.   Exercise discussed with patient.  Patient informed this nurse that he just moved to [place], [place] from [place].  Patient's brother lives in this area as well.  Patient also stated he has established an appointment with a new cardiologist at [facility] and will see him on 03/22/2017.  He could not remember the physician's  name.  Patient stated he usually walks every day.  In fact, he got up to 10 miles per day at one point.   Patient verbalized understanding of the benefits of exercise on his heart and overall health. Cardiac Rehab program discussed. Patient informed me that he did start a Cardiac Rehab program after his CABG, but was unable to continue due to angina.  After his angina was stablized the patient started walking and has continued to walk.   Patient will talk with his new cardiologist at [facility] about Cardiac  Rehab.  [facility] Cardiac Rehab brochure given to patient.  Patient thanked me for providing this information.  Patient then shared positive comments with me about his care at  [facility].     Army Melia, RN, BSN, [facility] Cardiovascular and Pulmonary Nurse Navigator 9:00 - 10:10

## 2017-02-27 NOTE — Discharge Instructions (Signed)
Heart healthy and ADA diet. °

## 2017-02-27 NOTE — Plan of Care (Signed)
Problem: Food- and Nutrition-Related Knowledge Deficit (NB-1.1) Goal: Nutrition education Formal process to instruct or train a patient/client in a skill or to impart knowledge to help patients/clients voluntarily manage or modify food choices and eating behavior to maintain or improve health. Outcome: Adequate for Discharge Nutrition Education Note  RD consulted for nutrition education regarding a Heart Healthy diet.   Lipid Panel     Component Value Date/Time   CHOL 89 02/25/2017 0428   TRIG 60 02/25/2017 0428   HDL 35 (L) 02/25/2017 0428   CHOLHDL 2.5 02/25/2017 0428   VLDL 12 02/25/2017 0428   LDLCALC 42 02/25/2017 0428    RD provided "Heart Healthy Nutrition Therapy" handout from the Academy of Nutrition and Dietetics. Reviewed patient's dietary recall. Provided examples on ways to decrease sodium and fat intake in diet. Discouraged intake of processed foods and use of salt shaker. Encouraged fresh fruits and vegetables as well as whole grain sources of carbohydrates to maximize fiber intake. Teach back method used.  Expect fair compliance.  Body mass index is 33.29 kg/m. Pt meets criteria for obese class I based on current BMI.  Current diet order is heart healthy/carb mod, patient is consuming approximately 100% of meals at this time. Labs and medications reviewed. No further nutrition interventions warranted at this time. RD contact information provided. If additional nutrition issues arise, please re-consult RD.  Dionne Ano. Sanvi Ehler, MS, RD LDN Inpatient Clinical Dietitian Pager [phone]

## 2017-02-27 NOTE — Progress Notes (Signed)
Patient Name: Christopher Mathe Sr. Date of Encounter: 02/27/2017  Primary Cardiologist: New to [facility] - consult by [facility] Problem List     Active Problems:   Chest pain   NSTEMI (non-ST elevated myocardial infarction) [facility])     Subjective   Status post LHC on 8/27 with balloon angioplasty of LCx ISR. Feels much better. No further chest pain. No issues with left radial cath site. Labs stable. Tolerating medications.   Inpatient Medications    Scheduled Meds: . aspirin EC  81 mg Oral Daily  . atorvastatin  80 mg Oral Daily  . clopidogrel  75 mg Oral Daily  . docusate sodium  100 mg Oral BID  . fluticasone  1 spray Each Nare Daily  . insulin aspart  0-5 Units Subcutaneous QHS  . insulin aspart  0-9 Units Subcutaneous TID WC  . linagliptin  5 mg Oral Daily  . lisinopril  10 mg Oral QHS  . metoprolol succinate  25 mg Oral Daily  . pantoprazole  40 mg Oral Daily  . sodium chloride flush  3 mL Intravenous Q12H   Continuous Infusions: . sodium chloride     PRN Meds: sodium chloride, acetaminophen **OR** acetaminophen, meclizine, morphine injection, nitroGLYCERIN, ondansetron **OR** ondansetron (ZOFRAN) IV, sodium chloride, sodium chloride flush   Vital Signs    Vitals:   02/26/17 1832 02/26/17 1901 02/26/17 1923 02/27/17 0501  BP: 112/87 107/78 135/84 (!) 144/73  Pulse: 63 (!) 160 (!) 58 63  Resp:   18 16  Temp:   98.9 F (37.2 C) 98.1 F (36.7 C)  TempSrc:   Oral Oral  SpO2: 98% 97% 98% 97%  Weight:    232 lb (105.2 kg)  Height:        Intake/Output Summary (Last 24 hours) at 02/27/17 0857 Last data filed at 02/27/17 0727  Gross per 24 hour  Intake             3118 ml  Output              850 ml  Net             2268 ml   Filed Weights   02/25/17 0452 02/26/17 0454 02/27/17 0501  Weight: 233 lb (105.7 kg) 232 lb 11.2 oz (105.6 kg) 232 lb (105.2 kg)    Physical Exam    GEN: Well nourished, well developed, in no acute distress.  HEENT: Grossly  normal.  Neck: Supple, no JVD, carotid bruits, or masses. Cardiac: RRR, no murmurs, rubs, or gallops. No clubbing, cyanosis, edema.  Radials/DP/PT 2+ and equal bilaterally. Left radial cath site without bleeding, bruising, swelling, erythema or TTP. Radial pulse 2+.  Respiratory:  Respirations regular and unlabored, clear to auscultation bilaterally. GI: Soft, nontender, nondistended, BS + x 4. MS: no deformity or atrophy. Skin: warm and dry, no rash. Neuro:  Strength and sensation are intact. Psych: AAOx3.  Normal affect.  Labs    CBC  Recent Labs  02/26/17 0426 02/27/17 0445  WBC 7.2 7.1  HGB 12.3* 11.9*  HCT 37.2* 35.3*  MCV 80.0 79.8*  PLT 169 167   Basic Metabolic Panel  Recent Labs  02/24/17 1017 02/25/17 0428 02/27/17 0445  NA  --  137 138  K  --  4.1 3.7  CL  --  102 103  CO2  --  29 29  GLUCOSE  --  197* 112*  BUN  --  12 12  CREATININE  --  0.88 0.67  CALCIUM  --  8.6* 8.8*  MG 1.9 1.8  --    Liver Function Tests No results for input(s): AST, ALT, ALKPHOS, BILITOT, PROT, ALBUMIN in the last 72 hours. No results for input(s): LIPASE, AMYLASE in the last 72 hours. Cardiac Enzymes  Recent Labs  02/24/17 1017 02/24/17 1602  TROPONINI 1.92* 2.71*   BNP Invalid input(s): POCBNP D-Dimer No results for input(s): DDIMER in the last 72 hours. Hemoglobin A1C No results for input(s): HGBA1C in the last 72 hours. Fasting Lipid Panel  Recent Labs  02/25/17 0428  CHOL 89  HDL 35*  LDLCALC 42  TRIG 60  CHOLHDL 2.5   Thyroid Function Tests No results for input(s): TSH, T4TOTAL, T3FREE, THYROIDAB in the last 72 hours.  Invalid input(s): FREET3  Telemetry    NSR with sinus arrhythmia - Personally Reviewed  ECG    n/a - Personally Reviewed  Radiology    No results found.  Cardiac Studies   TTE 02/25/2017: Study Conclusions  - Procedure narrative: Transthoracic echocardiography. Image   quality was suboptimal. The study was technically  difficult, as a   result of poor acoustic windows and poor sound wave transmission. - Left ventricle: LV systolic function is grossly normal Cannot   evaluate regional wall motion Poor acoustic windows Endocardium   is not well seen. The cavity size was mildly dilated. Wall   thickness was normal. - Aortic valve: AV prosthesis appears to open well Peak and mean   gradients through the valve are 18 and 10 mm Hg respectively. - Left atrium: The atrium was mildly dilated. - Right atrium: The atrium was mildly dilated. - Pericardium, extracardiac: A trivial pericardial effusion was   identified.  LHC 02/26/2017: Conclusion     Mid LAD lesion, 70 %stenosed.  Ost 1st Diag to 1st Diag lesion, 95 %stenosed.  Dist LAD lesion, 100 %stenosed.  1st Diag lesion, 70 %stenosed.  Ost 3rd Mrg lesion, 90 %stenosed.  LIMA and is anatomically normal.  Prox RCA lesion, 10 %stenosed.  Mid RCA lesion, 30 %stenosed.  Dist Cx lesion, 95 %stenosed.  Post intervention, there is a 30% residual stenosis.  Prox Cx lesion, 70 %stenosed.  Post intervention, there is a 15% residual stenosis.   1. Significant underlying three-vessel coronary artery disease with patent LIMA to LAD. Patent RCA stent with no significant restenosis. Significant in-stent restenosis in the proximal and distal left circumflex. Significant in-stent restenosis in first diagonal which has diffuse disease in a long segment. 2. The aortic valve was not crossed. It was normal by echo with normal EF. 3. Successful balloon angioplasty of the proximal and distal left circumflex in-stent restenosis.  Recommendations: Continue indefinite dual antiplatelet therapy. Continue aggressive treatment of risk factors. That diagonal is diffusely diseased and not optimal for PCI. The left circumflex is at high risk for restenosis especially distally given that the vessel diameter is less than 2 mm at the bifurcation of a small OM which also has  severe ostial stenosis. Possible discharge home tomorrow.     Patient Profile     66 y.o. male with history of CAD (s/p CABG; s/p PCI) , AV dz (s/p AVR) admitted with CP and NSTEMI   Assessment & Plan    1. NSTEMI/CAD s/p CABG: -S/p LHC on 8/27 that showed significant 3-vessel CAD with patent LIMA to LAD. Patent RCA stent with no significant restenosis. Significant in-stent restenosis in the proximal and distal left circumflex. Significant in-stent restenosis in first  diagonal which has diffuse disease in a long segment. The aortic valve was not crossed. It was normal by echo with normal EF. Successful balloon angioplasty of the proximal and distal left circumflex in-stent restenosis -Continue DAPT with ASA 81 mg daily and Plavix 745 mg daily -The diagonal is diffusely diseased and not optimal for PCI. The left circumflex is at high risk for restenosis especially distally given that the vessel diameter is less than 2 mm at the bifurcation of a small OM which also has severe ostial stenosis -Continue Lipitor and Toprol XL -Cardiac rehab as an outpatient  2. Aortic valve disease s/p AVR: -Echo as above -Asymptomatic -Outpatient follow up  3. HLD: -Liptior -LDL at goal  4. HTN: -BP reasonably controlled  5. DM2: -Per IM  Signed, Eula Listen, PA-C [facility] Pager: [phone] 02/27/2017, 8:57 AM

## 2017-03-08 ENCOUNTER — Ambulatory Visit: Payer: BLUE CROSS/BLUE SHIELD | Admitting: Cardiovascular Disease

## 2017-03-12 ENCOUNTER — Observation Stay
Admission: EM | Admit: 2017-03-12 | Discharge: 2017-03-14 | Disposition: A | Discharge status: Home / Self Care | Payer: BLUE CROSS/BLUE SHIELD | Attending: Internal Medicine | Admitting: Internal Medicine

## 2017-03-12 DIAGNOSIS — I10 Essential (primary) hypertension: Secondary | ICD-10-CM | POA: Insufficient documentation

## 2017-03-12 DIAGNOSIS — K625 Hemorrhage of anus and rectum: Principal | ICD-10-CM | POA: Insufficient documentation

## 2017-03-12 DIAGNOSIS — I252 Old myocardial infarction: Secondary | ICD-10-CM | POA: Diagnosis not present

## 2017-03-12 DIAGNOSIS — I2510 Atherosclerotic heart disease of native coronary artery without angina pectoris: Secondary | ICD-10-CM | POA: Diagnosis not present

## 2017-03-12 DIAGNOSIS — Z888 Allergy status to other drugs, medicaments and biological substances status: Secondary | ICD-10-CM | POA: Diagnosis not present

## 2017-03-12 DIAGNOSIS — Z951 Presence of aortocoronary bypass graft: Secondary | ICD-10-CM | POA: Diagnosis not present

## 2017-03-12 DIAGNOSIS — Z7902 Long term (current) use of antithrombotics/antiplatelets: Secondary | ICD-10-CM | POA: Diagnosis not present

## 2017-03-12 DIAGNOSIS — Z952 Presence of prosthetic heart valve: Secondary | ICD-10-CM | POA: Diagnosis not present

## 2017-03-12 DIAGNOSIS — Z79899 Other long term (current) drug therapy: Secondary | ICD-10-CM | POA: Diagnosis not present

## 2017-03-12 DIAGNOSIS — E119 Type 2 diabetes mellitus without complications: Secondary | ICD-10-CM | POA: Diagnosis not present

## 2017-03-12 DIAGNOSIS — Z7982 Long term (current) use of aspirin: Secondary | ICD-10-CM | POA: Insufficient documentation

## 2017-03-12 LAB — CBC
HCT: 40.7 % (ref 40.0–52.0)
HEMOGLOBIN: 13.8 g/dL (ref 13.0–18.0)
MCH: 27.2 pg (ref 26.0–34.0)
MCHC: 34 g/dL (ref 32.0–36.0)
MCV: 80.2 fL (ref 80.0–100.0)
Platelets: 217 10*3/uL (ref 150–440)
RBC: 5.07 MIL/uL (ref 4.40–5.90)
RDW: 16 % — ABNORMAL HIGH (ref 11.5–14.5)
WBC: 9.4 10*3/uL (ref 3.8–10.6)

## 2017-03-12 LAB — COMPREHENSIVE METABOLIC PANEL
ALT: 21 U/L (ref 17–63)
ANION GAP: 9 (ref 5–15)
AST: 38 U/L (ref 15–41)
Albumin: 3.8 g/dL (ref 3.5–5.0)
Alkaline Phosphatase: 74 U/L (ref 38–126)
BUN: 13 mg/dL (ref 6–20)
CALCIUM: 8.7 mg/dL — AB (ref 8.9–10.3)
CHLORIDE: 106 mmol/L (ref 101–111)
CO2: 24 mmol/L (ref 22–32)
Creatinine, Ser: 0.64 mg/dL (ref 0.61–1.24)
Glucose, Bld: 244 mg/dL — ABNORMAL HIGH (ref 65–99)
Potassium: 3.9 mmol/L (ref 3.5–5.1)
SODIUM: 139 mmol/L (ref 135–145)
Total Bilirubin: 1.2 mg/dL (ref 0.3–1.2)
Total Protein: 6.9 g/dL (ref 6.5–8.1)

## 2017-03-12 LAB — TYPE AND SCREEN
ABO/RH(D): A NEG
Antibody Screen: NEGATIVE

## 2017-03-12 LAB — PROTIME-INR
INR: 1.08
PROTHROMBIN TIME: 13.9 s (ref 11.4–15.2)

## 2017-03-12 MED ORDER — SODIUM CHLORIDE 0.9 % IV SOLN
80.0000 mg | Freq: Once | INTRAVENOUS | Status: AC
  Administered 2017-03-12: 80 mg via INTRAVENOUS
  Filled 2017-03-12: qty 80

## 2017-03-12 MED ORDER — PANTOPRAZOLE SODIUM 40 MG IV SOLR
40.0000 mg | Freq: Two times a day (BID) | INTRAVENOUS | Status: DC
Start: 2017-03-16 — End: 2017-03-14

## 2017-03-12 MED ORDER — SODIUM CHLORIDE 0.9 % IV SOLN
8.0000 mg/h | INTRAVENOUS | Status: DC
  Administered 2017-03-13: 8 mg/h via INTRAVENOUS
  Filled 2017-03-12: qty 80

## 2017-03-12 NOTE — ED Triage Notes (Signed)
Pt brought in from home by [facility].  Pt c/o bright red blood coming from rectum.  Pt is A&Ox4, pt anxious at this time.  Pt states he has an extensive cardiac history and has been in an out of [place] in [place] in the past year.  Pt denies any pain at this time.

## 2017-03-12 NOTE — ED Provider Notes (Signed)
[facility] Emergency Department Provider Note   ____________________________________________   I have reviewed the triage vital signs and the nursing notes.   HISTORY  Chief Complaint Rectal Bleeding   History limited by: Not Limited   HPI Christopher Sermeno Sr. is a 66 y.o. male who presents to the emergency department today because of concerns for rectal bleeding. Patient states it started today. He first noticed it while wiping. It is bright red. He then had 2 further bloody bowel movements. He states these latter bowel movements did not have any stool. A contained bright red blood. He describes it as a gush. He has had some mild lower abdominal discomfort. He denies any nausea or vomiting. Denies any chest pain. He denies any shortness of breath. He is on Plavix for heart disease.   Past Medical History:  Diagnosis Date  . CAD (coronary artery disease)   . Colitis   . Diabetes mellitus without complication (HCC)   . Diverticulitis   . Hypertension     Patient Active Problem List   Diagnosis Date Noted  . Chest pain 02/24/2017  . NSTEMI (non-ST elevated myocardial infarction) (HCC) 02/24/2017    Past Surgical History:  Procedure Laterality Date  . AORTIC VALVE REPLACEMENT    . CORONARY ARTERY BYPASS GRAFT    . CORONARY STENT INTERVENTION N/A 02/26/2017   Procedure: CORONARY STENT INTERVENTION;  Surgeon: Iran Ouch, MD;  Location: [facility] INVASIVE CV LAB;  Service: Cardiovascular;  Laterality: N/A;  . CORONARY/GRAFT ANGIOGRAPHY N/A 02/26/2017   Procedure: CORONARY/GRAFT ANGIOGRAPHY;  Surgeon: Iran Ouch, MD;  Location: [facility] INVASIVE CV LAB;  Service: Cardiovascular;  Laterality: N/A;    Prior to Admission medications   Medication Sig Start Date End Date Taking? Authorizing Provider  aspirin EC 81 MG tablet Take 81 mg by mouth daily.    [provider]  atorvastatin (LIPITOR) 80 MG tablet Take 80 mg by mouth daily.    [provider]  clopidogrel (PLAVIX) 75 MG tablet Take 75 mg by mouth daily.    [provider]  fluticasone (FLONASE) 50 MCG/ACT nasal spray Place 1 spray into both nostrils daily.    [provider]  lisinopril (PRINIVIL,ZESTRIL) 10 MG tablet Take 10 mg by mouth at bedtime. 02/20/17   [provider]  meclizine (ANTIVERT) 25 MG tablet Take 25 mg by mouth 3 (three) times daily as needed for dizziness.    [provider]  metoprolol succinate (TOPROL-XL) 25 MG 24 hr tablet Take 25 mg by mouth daily.    [provider]  nitroGLYCERIN (NITROSTAT) 0.4 MG SL tablet Place 1 tablet (0.4 mg total) under the tongue every 5 (five) minutes as needed for chest pain. 02/27/17   Shaune Pollack, MD  pantoprazole (PROTONIX) 40 MG tablet Take 40 mg by mouth daily.    [provider]  sitaGLIPtin (JANUVIA) 50 MG tablet Take 100 mg by mouth daily.    [provider]    Allergies Metformin and related  Family History  Problem Relation Age of Onset  . CAD Mother   . Heart failure Mother   . CAD Brother     Social History Social History  Substance Use Topics  . Smoking status: Never Smoker  . Smokeless tobacco: Never Used  . Alcohol use No    Review of Systems Constitutional: No fever/chills Eyes: No visual changes. ENT: No sore throat. Cardiovascular: Denies chest pain. Respiratory: Denies shortness of breath. Gastrointestinal: Positive for rectal  bleeding Genitourinary: Negative for dysuria. Musculoskeletal: Negative for back pain. Skin: Negative for rash. Neurological: Negative for headaches, focal weakness or numbness.  ____________________________________________   PHYSICAL EXAM:  VITAL SIGNS: ED Triage Vitals  Enc Vitals Group     BP 03/12/17 2003 (!) 158/76     Pulse Rate 03/12/17 2003 67     Resp 03/12/17 2003 18     Temp 03/12/17 2003 97.9 F (36.6 C)     Temp Source 03/12/17 2003 Oral     SpO2 03/12/17 2003 99 %      Weight 03/12/17 2000 232 lb (105.2 kg)     Height 03/12/17 2000 5\' 10"  (1.778 m)   Constitutional: Alert and oriented. Well appearing and in no distress. Eyes: Conjunctivae are normal.  ENT   Head: Normocephalic and atraumatic.   Nose: No congestion/rhinnorhea.   Mouth/Throat: Mucous membranes are moist.   Neck: No stridor. Hematological/Lymphatic/Immunilogical: No cervical lymphadenopathy. Cardiovascular: Normal rate, irregular irregular rhythm.  No murmurs, rubs, or gallops.  Respiratory: Normal respiratory effort without tachypnea nor retractions. Breath sounds are clear and equal bilaterally. No wheezes/rales/rhonchi. Gastrointestinal: Soft and non tender. No rebound. No guarding.  Genitourinary: Deferred Musculoskeletal: Normal range of motion in all extremities. No lower extremity edema. Neurologic:  Normal speech and language. No gross focal neurologic deficits are appreciated.  Skin:  Skin is warm, dry and intact. No rash noted. Psychiatric: Mood and affect are normal. Speech and behavior are normal. Patient exhibits appropriate insight and judgment.  ____________________________________________    LABS (pertinent positives/negatives)  Labs Reviewed  COMPREHENSIVE METABOLIC PANEL - Abnormal; Notable for the following:       Result Value   Glucose, Bld 244 (*)    Calcium 8.7 (*)    All other components within normal limits  CBC - Abnormal; Notable for the following:    RDW 16.0 (*)    All other components within normal limits  PROTIME-INR  POC OCCULT BLOOD, ED  TYPE AND SCREEN  TYPE AND SCREEN     ____________________________________________   EKG  I, Phineas SemenGraydon Braidyn Peace, attending physician, personally viewed and interpreted this EKG  EKG Time: 2002 Rate: 67 Rhythm: atrial fibrillation Axis: normal Intervals: qtc 478 QRS: narrow ST changes: no st elevation Impression: abnormal ekg   ____________________________________________     RADIOLOGY  None  ____________________________________________   PROCEDURES  Procedures  ____________________________________________   INITIAL IMPRESSION / ASSESSMENT AND PLAN / ED COURSE  Pertinent labs & imaging results that were available during my care of the patient were reviewed by me and considered in my medical decision making (see chart for details).  Patient presented to the emergency department today because of concerns for GI bleeding in the setting of Plavix use. Patient's vital signs all normal home. Will plan on admission for observation.  ____________________________________________   FINAL CLINICAL IMPRESSION(S) / ED DIAGNOSES  Final diagnoses:  Rectal bleeding     Note: This dictation was prepared with Dragon dictation. Any transcriptional errors that result from this process are unintentional     Phineas SemenGoodman, Letasha Kershaw, MD 03/12/17 2313

## 2017-03-12 NOTE — H&P (Signed)
[facility] [place] at [facility]   PATIENT NAME: Christopher Cummings    MR#:  [id number]  DATE OF BIRTH:  September 13, 1950  DATE OF ADMISSION:  03/12/2017  PRIMARY CARE PHYSICIAN: System, Pcp Not In   REQUESTING/REFERRING PHYSICIAN: Derrill KayGoodman, MD  CHIEF COMPLAINT:   Chief Complaint  Patient presents with  . Rectal Bleeding    HISTORY OF PRESENT ILLNESS:  Christopher Cummings  is a 66 y.o. male who presents with 4-5 episodes of bright red blood per rectum today. Patient states he has had this in the past and was told that he have bleeding potentially from a polyp or may be from his diverticula. Today he started with smaller amounts of bright red blood in his stool, and then states that his bowel movements became frank bright red blood with no stool. Here in the ED his found to have a stable hemoglobin, hospitalists were called for admission and further evaluation.  PAST MEDICAL HISTORY:   Past Medical History:  Diagnosis Date  . CAD (coronary artery disease)   . Colitis   . Diabetes mellitus without complication (HCC)   . Diverticulitis   . Hypertension     PAST SURGICAL HISTORY:   Past Surgical History:  Procedure Laterality Date  . AORTIC VALVE REPLACEMENT    . CORONARY ARTERY BYPASS GRAFT    . CORONARY STENT INTERVENTION N/A 02/26/2017   Procedure: CORONARY STENT INTERVENTION;  Surgeon: Iran OuchArida, Muhammad A, MD;  Location: [facility] INVASIVE CV LAB;  Service: Cardiovascular;  Laterality: N/A;  . CORONARY/GRAFT ANGIOGRAPHY N/A 02/26/2017   Procedure: CORONARY/GRAFT ANGIOGRAPHY;  Surgeon: Iran OuchArida, Muhammad A, MD;  Location: [facility] INVASIVE CV LAB;  Service: Cardiovascular;  Laterality: N/A;    SOCIAL HISTORY:   Social History  Substance Use Topics  . Smoking status: Never Smoker  . Smokeless tobacco: Never Used  . Alcohol use No    FAMILY HISTORY:   Family History  Problem Relation Age of Onset  . CAD Mother   . Heart failure Mother   . CAD Brother     DRUG  ALLERGIES:   Allergies  Allergen Reactions  . Metformin And Related     MEDICATIONS AT HOME:   Prior to Admission medications   Medication Sig Start Date End Date Taking? Authorizing Provider  aspirin EC 81 MG tablet Take 81 mg by mouth daily.   Yes [provider]  atorvastatin (LIPITOR) 80 MG tablet Take 80 mg by mouth daily.   Yes [provider]  clopidogrel (PLAVIX) 75 MG tablet Take 75 mg by mouth daily.   Yes [provider]  lisinopril (PRINIVIL,ZESTRIL) 10 MG tablet Take 10 mg by mouth at bedtime. 02/20/17  Yes [provider]  meclizine (ANTIVERT) 25 MG tablet Take 25 mg by mouth 3 (three) times daily as needed for dizziness.   Yes [provider]  metoprolol succinate (TOPROL-XL) 25 MG 24 hr tablet Take 25 mg by mouth daily.   Yes [provider]  nitroGLYCERIN (NITROSTAT) 0.4 MG SL tablet Place 1 tablet (0.4 mg total) under the tongue every 5 (five) minutes as needed for chest pain. 02/27/17  Yes Shaune Pollackhen, Qing, MD  pantoprazole (PROTONIX) 40 MG tablet Take 40 mg by mouth daily.   Yes [provider]  sitaGLIPtin (JANUVIA) 50 MG tablet Take 100 mg by mouth daily.   Yes [provider]  fluticasone (FLONASE) 50 MCG/ACT nasal spray Place 1 spray into both nostrils daily.    [provider]  REVIEW OF SYSTEMS:  Review of Systems  Constitutional: Negative for chills, fever, malaise/fatigue and weight loss.  HENT: Negative for ear pain, hearing loss and tinnitus.   Eyes: Negative for blurred vision, double vision, pain and redness.  Respiratory: Negative for cough, hemoptysis and shortness of breath.   Cardiovascular: Negative for chest pain, palpitations, orthopnea and leg swelling.  Gastrointestinal: Positive for blood in stool. Negative for abdominal pain, constipation, diarrhea, nausea and vomiting.  Genitourinary: Negative for dysuria, frequency and hematuria.  Musculoskeletal: Negative for back  pain, joint pain and neck pain.  Skin:       No acne, rash, or lesions  Neurological: Negative for dizziness, tremors, focal weakness and weakness.  Endo/Heme/Allergies: Negative for polydipsia. Does not bruise/bleed easily.  Psychiatric/Behavioral: Negative for depression. The patient is not nervous/anxious and does not have insomnia.      VITAL SIGNS:   Vitals:   03/12/17 2000 03/12/17 2003 03/12/17 2330  BP:  (!) 158/76 (!) 117/55  Pulse:  67 69  Resp:  18 17  Temp:  97.9 F (36.6 C)   TempSrc:  Oral   SpO2:  99% 96%  Weight: 105.2 kg (232 lb)    Height:  (1.778 m)     Wt Readings from Last 3 Encounters:  03/12/17 105.2 kg (232 lb)  02/27/17 105.2 kg (232 lb)    PHYSICAL EXAMINATION:  Physical Exam  Vitals reviewed. Constitutional: He is oriented to person, place, and time. He appears well-developed and well-nourished. No distress.  HENT:  Head: Normocephalic and atraumatic.  Mouth/Throat: Oropharynx is clear and moist.  Eyes: Pupils are equal, round, and reactive to light. Conjunctivae and EOM are normal. No scleral icterus.  Neck: Normal range of motion. Neck supple. No JVD present. No thyromegaly present.  Cardiovascular: Normal rate, regular rhythm and intact distal pulses.  Exam reveals no gallop and no friction rub.   No murmur heard. Respiratory: Effort normal and breath sounds normal. No respiratory distress. He has no wheezes. He has no rales.  GI: Soft. Bowel sounds are normal. He exhibits no distension. There is no tenderness.  Musculoskeletal: Normal range of motion. He exhibits no edema.  No arthritis, no gout  Lymphadenopathy:    He has no cervical adenopathy.  Neurological: He is alert and oriented to person, place, and time. No cranial nerve deficit.  No dysarthria, no aphasia  Skin: Skin is warm and dry. No rash noted. No erythema.  Psychiatric: He has a normal mood and affect. His behavior is normal. Judgment and thought content normal.     LABORATORY PANEL:   CBC  Recent Labs Lab 03/12/17 1959  WBC 9.4  HGB 13.8  HCT 40.7  PLT 217   ------------------------------------------------------------------------------------------------------------------  Chemistries   Recent Labs Lab 03/12/17 1959  NA 139  K 3.9  CL 106  CO2 24  GLUCOSE 244*  BUN 13  CREATININE 0.64  CALCIUM 8.7*  AST 38  ALT 21  ALKPHOS 74  BILITOT 1.2   ------------------------------------------------------------------------------------------------------------------  Cardiac Enzymes No results for input(s): TROPONINI in the last 168 hours. ------------------------------------------------------------------------------------------------------------------  RADIOLOGY:  No results found.  EKG:   Orders placed or performed during the hospital encounter of 03/12/17  . EKG 12-Lead  . EKG 12-Lead    IMPRESSION AND PLAN:  Principal Problem:   BRBPR (bright red blood per rectum) - he was placed on PPI drip in the ED, we'll keep this in place for now. GI consult Active Problems:   HTN (  hypertension) - stable, continue home meds   Diabetes (HCC) - sliding scale insulin with corresponding glucose checks   CAD (coronary artery disease) - continue home meds  All the records are reviewed and case discussed with ED provider. Management plans discussed with the patient and/or family.  DVT PROPHYLAXIS: Mechanical only  GI PROPHYLAXIS: PPI  ADMISSION STATUS: Observation  CODE STATUS: Full Code Status History    Date Active Date Inactive Code Status Order ID Comments User Context   02/24/2017  4:20 AM 02/27/2017  2:10 PM Full Code [id number]  Arnaldo Natal, MD Inpatient      TOTAL TIME TAKING CARE OF THIS PATIENT: 40 minutes.   Christopher Cummings 03/12/2017, 11:44 PM  [facility]  Office  [phone]  CC: Primary care physician; System, Pcp Not In  Note:  This document was prepared using Dragon voice  recognition software and may include unintentional dictation errors.

## 2017-03-12 NOTE — ED Notes (Signed)
Repositioned patient.

## 2017-03-13 DIAGNOSIS — K625 Hemorrhage of anus and rectum: Secondary | ICD-10-CM

## 2017-03-13 LAB — BASIC METABOLIC PANEL
Anion gap: 7 (ref 5–15)
BUN: 12 mg/dL (ref 6–20)
CHLORIDE: 105 mmol/L (ref 101–111)
CO2: 30 mmol/L (ref 22–32)
CREATININE: 0.58 mg/dL — AB (ref 0.61–1.24)
Calcium: 8.9 mg/dL (ref 8.9–10.3)
GFR calc non Af Amer: >60 mL/min (ref >60)
Glucose, Bld: 142 mg/dL — ABNORMAL HIGH (ref 65–99)
Potassium: 3.2 mmol/L — ABNORMAL LOW (ref 3.5–5.1)
Sodium: 142 mmol/L (ref 135–145)

## 2017-03-13 LAB — CBC
HEMATOCRIT: 38.4 % — AB (ref 40.0–52.0)
Hemoglobin: 13.4 g/dL (ref 13.0–18.0)
MCH: 28.3 pg (ref 26.0–34.0)
MCHC: 35.1 g/dL (ref 32.0–36.0)
MCV: 80.7 fL (ref 80.0–100.0)
PLATELETS: 188 10*3/uL (ref 150–440)
RBC: 4.75 MIL/uL (ref 4.40–5.90)
RDW: 15.8 % — ABNORMAL HIGH (ref 11.5–14.5)
WBC: 10 10*3/uL (ref 3.8–10.6)

## 2017-03-13 LAB — GLUCOSE, CAPILLARY
GLUCOSE-CAPILLARY: 155 mg/dL — AB (ref 65–99)
Glucose-Capillary: 132 mg/dL — ABNORMAL HIGH (ref 65–99)
Glucose-Capillary: 130 mg/dL — ABNORMAL HIGH (ref 65–99)
Glucose-Capillary: 193 mg/dL — ABNORMAL HIGH (ref 65–99)
Glucose-Capillary: 199 mg/dL — ABNORMAL HIGH (ref 65–99)

## 2017-03-13 LAB — MAGNESIUM: Magnesium: 1.7 mg/dL (ref 1.7–2.4)

## 2017-03-13 MED ORDER — LISINOPRIL 10 MG PO TABS
10.0000 mg | ORAL_TABLET | Freq: Every day | ORAL | Status: DC
  Administered 2017-03-13 (×2): 10 mg via ORAL
  Filled 2017-03-13 (×2): qty 1

## 2017-03-13 MED ORDER — ATORVASTATIN CALCIUM 20 MG PO TABS: 80.0000 mg | ORAL_TABLET | Freq: Every day | ORAL | Status: DC

## 2017-03-13 MED ORDER — ASPIRIN EC 81 MG PO TBEC: 81.0000 mg | DELAYED_RELEASE_TABLET | Freq: Every day | ORAL | Status: DC

## 2017-03-13 MED ORDER — ACETAMINOPHEN 650 MG RE SUPP: 650.0000 mg | Freq: Four times a day (QID) | RECTAL | Status: DC | PRN

## 2017-03-13 MED ORDER — ONDANSETRON HCL 4 MG PO TABS: 4.0000 mg | ORAL_TABLET | Freq: Four times a day (QID) | ORAL | Status: DC | PRN

## 2017-03-13 MED ORDER — INSULIN ASPART 100 UNIT/ML ~~LOC~~ SOLN: 0.0000 [IU] | Freq: Every day | SUBCUTANEOUS | Status: DC

## 2017-03-13 MED ORDER — METOPROLOL SUCCINATE ER 25 MG PO TB24
25.0000 mg | ORAL_TABLET | Freq: Every day | ORAL | Status: DC
  Administered 2017-03-13 – 2017-03-14 (×2): 25 mg via ORAL
  Filled 2017-03-13 (×2): qty 1

## 2017-03-13 MED ORDER — CLOPIDOGREL BISULFATE 75 MG PO TABS: 75.0000 mg | ORAL_TABLET | Freq: Every day | ORAL | Status: DC

## 2017-03-13 MED ORDER — SODIUM CHLORIDE 0.9 % IV SOLN
INTRAVENOUS | Status: AC
  Administered 2017-03-13: 02:00:00 via INTRAVENOUS

## 2017-03-13 MED ORDER — INSULIN ASPART 100 UNIT/ML ~~LOC~~ SOLN
0.0000 [IU] | Freq: Three times a day (TID) | SUBCUTANEOUS | Status: DC
  Administered 2017-03-13 (×2): 2 [IU] via SUBCUTANEOUS
  Administered 2017-03-13: 09:00:00 1 [IU] via SUBCUTANEOUS
  Administered 2017-03-14: 2 [IU] via SUBCUTANEOUS
  Filled 2017-03-13 (×4): qty 1

## 2017-03-13 MED ORDER — POTASSIUM CHLORIDE CRYS ER 20 MEQ PO TBCR
40.0000 meq | EXTENDED_RELEASE_TABLET | Freq: Once | ORAL | Status: AC
  Administered 2017-03-13: 40 meq via ORAL
  Filled 2017-03-13: qty 2

## 2017-03-13 MED ORDER — ONDANSETRON HCL 4 MG/2ML IJ SOLN: 4.0000 mg | Freq: Four times a day (QID) | INTRAMUSCULAR | Status: DC | PRN

## 2017-03-13 MED ORDER — ACETAMINOPHEN 325 MG PO TABS
650.0000 mg | ORAL_TABLET | Freq: Four times a day (QID) | ORAL | Status: DC | PRN
  Administered 2017-03-13: 650 mg via ORAL
  Filled 2017-03-13: qty 2

## 2017-03-13 MED ORDER — ATORVASTATIN CALCIUM 20 MG PO TABS
80.0000 mg | ORAL_TABLET | Freq: Every day | ORAL | Status: DC
  Administered 2017-03-13 (×2): 80 mg via ORAL
  Filled 2017-03-13 (×2): qty 4

## 2017-03-13 NOTE — Progress Notes (Signed)
Clinical Child psychotherapistocial Worker (CSW) received consult for medication assistance. CSW sent RN case manager a message making her aware of above. Please reconsult if future social work needs arise. CSW signing off.   Baker Hughes IncorporatedBailey Kemiyah Tarazon, LCSW [phone]

## 2017-03-13 NOTE — Progress Notes (Signed)
[facility] - [place] at [facility]   PATIENT NAME: Christopher Cummings    MR#:  [id number]  DATE OF BIRTH:  10/10/1950  SUBJECTIVE:  CHIEF COMPLAINT:   Chief Complaint  Patient presents with  . Rectal Bleeding   No bowel movements since yesterday night. No abdominal pain. No vomiting.  REVIEW OF SYSTEMS:    Review of Systems  Constitutional: Negative for chills and fever.  HENT: Negative for sore throat.   Eyes: Negative for blurred vision, double vision and pain.  Respiratory: Negative for cough, hemoptysis, shortness of breath and wheezing.   Cardiovascular: Negative for chest pain, palpitations, orthopnea and leg swelling.  Gastrointestinal: Positive for blood in stool. Negative for abdominal pain, constipation, diarrhea, heartburn, nausea and vomiting.  Genitourinary: Negative for dysuria and hematuria.  Musculoskeletal: Negative for back pain and joint pain.  Skin: Negative for rash.  Neurological: Negative for sensory change, speech change, focal weakness and headaches.  Endo/Heme/Allergies: Does not bruise/bleed easily.  Psychiatric/Behavioral: Negative for depression. The patient is not nervous/anxious.     DRUG ALLERGIES:   Allergies  Allergen Reactions  . Metformin And Related     VITALS:  Blood pressure (!) 143/67, pulse 73, temperature 97.8 F (36.6 C), temperature source Oral, resp. rate 18, height 5\' 10"  (1.778 m), weight 102.1 kg (225 lb), SpO2 98 %.  PHYSICAL EXAMINATION:   Physical Exam  GENERAL:  66 y.o.-year-old patient lying in the bed with no acute distress.  EYES: Pupils equal, round, reactive to light and accommodation. No scleral icterus. Extraocular muscles intact.  HEENT: Head atraumatic, normocephalic. Oropharynx and nasopharynx clear.  NECK:  Supple, no jugular venous distention. No thyroid enlargement, no tenderness.  LUNGS: Normal breath sounds bilaterally, no wheezing, rales, rhonchi. No use of accessory muscles of  respiration.  CARDIOVASCULAR: S1, S2 normal. No murmurs, rubs, or gallops.  ABDOMEN: Soft, nontender, nondistended. Bowel sounds present. No organomegaly or mass.  EXTREMITIES: No cyanosis, clubbing or edema b/l.    NEUROLOGIC: Cranial nerves II through XII are intact. No focal Motor or sensory deficits b/l.   PSYCHIATRIC: The patient is alert and oriented x 3.  SKIN: No obvious rash, lesion, or ulcer.   LABORATORY PANEL:   CBC  Recent Labs Lab 03/13/17 0540  WBC 10.0  HGB 13.4  HCT 38.4*  PLT 188   ------------------------------------------------------------------------------------------------------------------ Chemistries   Recent Labs Lab 03/12/17 1959 03/13/17 0540  NA 139 142  K 3.9 3.2*  CL 106 105  CO2 24 30  GLUCOSE 244* 142*  BUN 13 12  CREATININE 0.64 0.58*  CALCIUM 8.7* 8.9  MG  --  1.7  AST 38  --   ALT 21  --   ALKPHOS 74  --   BILITOT 1.2  --    ------------------------------------------------------------------------------------------------------------------  Cardiac Enzymes No results for input(s): TROPONINI in the last 168 hours. ------------------------------------------------------------------------------------------------------------------  RADIOLOGY:  No results found.   ASSESSMENT AND PLAN:    * BRBPR Likely diverticular or hemorrhoidal. No heparin or Lovenox. Aspirin and Plavix held. Not upper GI bleed. Stop Protonix drip. Consult GI. Serial hemoglobin checks. Start full liquid diet   * HTN (hypertension) - stable, continue home meds   * Diabetes (HCC) - sliding scale insulin with corresponding glucose checks   * CAD (coronary artery disease) Continue home medications. Hold aspirin and Plavix for now due to GI bleed.  All the records are reviewed and case discussed with Care Management/Social Worker. Management plans discussed with the patient, family  and they are in agreement.  CODE STATUS: FULL CODE  DVT Prophylaxis:  SCDs  TOTAL TIME TAKING CARE OF THIS PATIENT: 35 minutes.   POSSIBLE D/C IN 1-2 DAYS, DEPENDING ON CLINICAL CONDITION.  Milagros Loll R M.D on 03/13/2017 at 2:43 PM  Between 7am to 6pm - Pager - [phone]  After 6pm go to [email or web address] - password [email or web address]  [facility]  Office  [phone]  CC: Primary care physician; System, Pcp Not In  Note: This dictation was prepared with Dragon dictation along with smaller phrase technology. Any transcriptional errors that result from this process are unintentional.

## 2017-03-13 NOTE — Consult Note (Signed)
Wyline Mood MD, MRCP(U.K) [address]  [address]  [place], [place] [address]  Main: [phone]  Fax: [phone]  Consultation  Referring Provider: Dr Elpidio Anis  Primary Care Physician:  System, Pcp Not In Primary Gastroenterologist: None          Reason for Consultation:     Rectal bleeding   Date of Admission:  03/12/2017 Date of Consultation:  03/13/2017         HPI:   Christopher Kimmel Sr. is a 66 y.o. male admitted last night with rectal bleeding .  The patient is on Plavix.   He says he is on plavix for his heart after a CABG. He says was doing well yestrerday when he had multiple painless bloody bowel movements. Last was last night and has none since. Last dose of plavix was on 03/12/17 . Denies any abdominal pain. About 9 months back had a similar episode in [place] and underwent a colonoscopy , he was told he had a polyp in the right side and the left side of his colon but was not resected due to him being on Plavix.   He was advised to have a colonoscopy with resection of the polyps. He had set up an appointment at [facility] but was admitted before that over here.   CBC    Component Value Date/Time   WBC 10.0 03/13/2017 0540   RBC 4.75 03/13/2017 0540   HGB 13.4 03/13/2017 0540   HCT 38.4 (L) 03/13/2017 0540   PLT 188 03/13/2017 0540   MCV 80.7 03/13/2017 0540   MCH 28.3 03/13/2017 0540   MCHC 35.1 03/13/2017 0540   RDW 15.8 (H) 03/13/2017 0540       Past Medical History:  Diagnosis Date  . CAD (coronary artery disease)   . Colitis   . Diabetes mellitus without complication (HCC)   . Diverticulitis   . Hypertension     Past Surgical History:  Procedure Laterality Date  . AORTIC VALVE REPLACEMENT    . CORONARY ARTERY BYPASS GRAFT    . CORONARY STENT INTERVENTION N/A 02/26/2017   Procedure: CORONARY STENT INTERVENTION;  Surgeon: Iran Ouch, MD;  Location: [facility] INVASIVE CV LAB;  Service: Cardiovascular;  Laterality: N/A;  . CORONARY/GRAFT  ANGIOGRAPHY N/A 02/26/2017   Procedure: CORONARY/GRAFT ANGIOGRAPHY;  Surgeon: Iran Ouch, MD;  Location: [facility] INVASIVE CV LAB;  Service: Cardiovascular;  Laterality: N/A;    Prior to Admission medications   Medication Sig Start Date End Date Taking? Authorizing Provider  aspirin EC 81 MG tablet Take 81 mg by mouth daily.   Yes [provider]  atorvastatin (LIPITOR) 80 MG tablet Take 80 mg by mouth daily.   Yes [provider]  clopidogrel (PLAVIX) 75 MG tablet Take 75 mg by mouth daily.   Yes [provider]  lisinopril (PRINIVIL,ZESTRIL) 10 MG tablet Take 10 mg by mouth at bedtime. 02/20/17  Yes [provider]  meclizine (ANTIVERT) 25 MG tablet Take 25 mg by mouth 3 (three) times daily as needed for dizziness.   Yes [provider]  metoprolol succinate (TOPROL-XL) 25 MG 24 hr tablet Take 25 mg by mouth daily.   Yes [provider]  nitroGLYCERIN (NITROSTAT) 0.4 MG SL tablet Place 1 tablet (0.4 mg total) under the tongue every 5 (five) minutes as needed for chest pain. 02/27/17  Yes Shaune Pollack, MD  pantoprazole (PROTONIX) 40 MG tablet Take 40 mg by mouth daily.   Yes [provider]  sitaGLIPtin (JANUVIA) 50 MG tablet Take 100 mg by mouth daily.   Yes [provider]  fluticasone (FLONASE) 50 MCG/ACT nasal spray Place 1 spray into both nostrils daily.    [provider]    Family History  Problem Relation Age of Onset  . CAD Mother   . Heart failure Mother   . CAD Brother      Social History  Substance Use Topics  . Smoking status: Never Smoker  . Smokeless tobacco: Never Used  . Alcohol use No    Allergies as of 03/12/2017 - Review Complete 03/12/2017  Allergen Reaction Noted  . Metformin and related  02/24/2017    Review of Systems:    All systems reviewed and negative except where noted in HPI.   Physical Exam:  Vital signs in last 24 hours: Temp:  [97.7 F (36.5 C)-97.9 F (36.6 C)]  97.8 F (36.6 C) (09/11 0859) Pulse Rate:  [67-73] 73 (09/11 0859) Resp:  [15-18] 18 (09/11 0859) BP: (117-158)/(55-76) 143/67 (09/11 0859) SpO2:  [96 %-99 %] 98 % (09/11 0859) Weight:  [225 lb (102.1 kg)-232 lb (105.2 kg)] 225 lb (102.1 kg) (09/11 0050) Last BM Date: 03/12/17 General:   Pleasant, cooperative in NAD Head:  Normocephalic and atraumatic. Eyes:   No icterus.   Conjunctiva pink. PERRLA. Ears:  Normal auditory acuity. Neck:  Supple; no masses or thyroidomegaly Lungs: Respirations even and unlabored. Lungs clear to auscultation bilaterally.   No wheezes, crackles, or rhonchi.  Heart:  Regular rate and rhythm;  Systolic murmur in the aortic area Abdomen:  Soft, nondistended, nontender. Normal bowel sounds. No appreciable masses or hepatomegaly.  No rebound or guarding.  Neurologic:  Alert and oriented x3;  grossly normal neurologically. Skin:  Intact without significant lesions or rashes. Cervical Nodes:  No significant cervical adenopathy. Psych:  Alert and cooperative. Normal affect.  LAB RESULTS:  Recent Labs  03/12/17 1959 03/13/17 0540  WBC 9.4 10.0  HGB 13.8 13.4  HCT 40.7 38.4*  PLT 217 188   BMET  Recent Labs  03/12/17 1959 03/13/17 0540  NA 139 142  K 3.9 3.2*  CL 106 105  CO2 24 30  GLUCOSE 244* 142*  BUN 13 12  CREATININE 0.64 0.58*  CALCIUM 8.7* 8.9   LFT  Recent Labs  03/12/17 1959  PROT 6.9  ALBUMIN 3.8  AST 38  ALT 21  ALKPHOS 74  BILITOT 1.2   PT/INR  Recent Labs  03/12/17 1959  LABPROT 13.9  INR 1.08    STUDIES: No results found.    Impression / Plan:   Christopher ElseCharles Gales Sr. is a 66 y.o. y/o male with rectal bleeding on Plavix. Last dose taken on 03/12/17. He had a recent colonoscopy and says he had a large polyp in the rectum that was not resected. He was also diagnosed with diverticulosis of the colon and advised that he may have had a diverticular bleed. At that time he was told that the rectal polyp may even be  cancerous. I provided options of a colonoscopy with possible resection on Sunday after a plavix washout . [facility]. Please liase with cardiology - if plavix can be held till out patient [facility] procedure then can be discharged if Hb stable tomorrow. If plavix cannot be held for long then will need inpatient transfer with colonoscopy done over the next few days so that plavix can be resumed pretty soon after.  I will sign off.  Please call me if any further GI concerns or questions.  We would like to thank you for the opportunity to participate in the care of Christopher Else Sr..  Thank you for involving me in the care of this patient.      LOS: 0 days   Wyline Mood, MD  03/13/2017, 10:26 AM

## 2017-03-13 NOTE — Progress Notes (Signed)
Patient admitted w/ BRBPR in stool then bright red blood in second bowel movement. Patient takes ASA/Plavix PTA -- spoke w/ MD to hold these until bleeding can stabilize.  MD notified and agrees w/ plan.  Thomasene Rippleavid Tex Conroy, PharmD, BCPS Clinical Pharmacist 03/13/2017

## 2017-03-13 NOTE — Plan of Care (Signed)
Problem: Education: Goal: Knowledge of [facility] Education information/materials will improve Outcome: Progressing VSS, free of falls.  Oriented to unit.  Social work c/s placed d/t pt reporting Januvia co-pay increased from $5-$600 monthly.  Care mgmt c/s placed d/t pt requesting "chair to wash up in."  Denies pain, no BM's since arriving to unit.  Bed in low position, call bell within reach.  WCTM.

## 2017-03-14 LAB — GLUCOSE, CAPILLARY: GLUCOSE-CAPILLARY: 158 mg/dL — AB (ref 65–99)

## 2017-03-14 LAB — HEMOGLOBIN: HEMOGLOBIN: 13.4 g/dL (ref 13.0–18.0)

## 2017-03-14 LAB — HIV ANTIBODY (ROUTINE TESTING W REFLEX): HIV Screen 4th Generation wRfx: NONREACTIVE

## 2017-03-14 NOTE — Discharge Instructions (Addendum)
°  [facility]  [address], [address] [place], [place] [address] Ph: [phone] Fax: [phone]  03/19/2017 at 1.30 PM. Please arrive by 12.30 PM.  Your bowel prep and [facility] [place].  Do not take Plavix prior to your colonoscopy. Your doctors can restart plavix if no bleeding found on colonoscopy.  You can take Aspirin but stop it on Sunday for your procedure

## 2017-03-17 NOTE — Discharge Summary (Signed)
[facility] - [place] [facility]   PATIENT NAME: Christopher Cummings    MR#:  [id number]  DATE OF BIRTH:  Jul 17, 1950  DATE OF ADMISSION:  03/12/2017 ADMITTING PHYSICIAN: Christopher Manis, MD  DATE OF DISCHARGE: 03/14/2017 12:09 PM  PRIMARY CARE PHYSICIAN: System, Pcp Not In   ADMISSION DIAGNOSIS:  Rectal bleeding [K62.5]  DISCHARGE DIAGNOSIS:  Principal Problem:   BRBPR (bright red blood per rectum) Active Problems:   HTN (hypertension)   Diabetes (HCC)   CAD (coronary artery disease)   SECONDARY DIAGNOSIS:   Past Medical History:  Diagnosis Date  . CAD (coronary artery disease)   . Colitis   . Diabetes mellitus without complication (HCC)   . Diverticulitis   . Hypertension      ADMITTING HISTORY  HISTORY OF PRESENT ILLNESS:  Christopher [facility]ings  is a 66 y.o. male who presents with 4-5 episodes of bright red blood per rectum today. Patient states he has had this in the past and was told that he have bleeding potentially from a polyp or may be from his diverticula. Today he started with smaller amounts of bright red blood in his stool, and then states that his bowel movements became frank bright red blood with no stool. Here in the ED his found to have a stable hemoglobin, hospitalists were called for admission and further evaluation.   [facility] COURSE:   * Bright red blood per rectum likely diverticular/polyp Discussed with Dr. Tobi [facility]ings of GI. Suggested patient get a colonoscopy. Patient wants to get his procedure at [facility]. I discussed the case with Dr. Mariah [facility]ings who suggested patient can stop Plavix for colonoscopy. This was held. Christopher [facility] and patient has an appointment on Monday for a colonoscopy. Hemoglobin remained stable. He had 3 bowel movements during the [facility] stay without any blood.Discharge home in stable condition.   CONSULTS OBTAINED:    DRUG ALLERGIES:   Allergies  Allergen Reactions  . Metformin And Related     DISCHARGE MEDICATIONS:    Discharge Medication List as of 03/14/2017 10:53 AM    CONTINUE these medications which have NOT CHANGED   Details  aspirin EC 81 MG tablet Take 81 mg by mouth daily., Historical Med    atorvastatin (LIPITOR) 80 MG tablet Take 80 mg by mouth daily., Historical Med    clopidogrel (PLAVIX) 75 MG tablet Take 75 mg by mouth daily., Historical Med    lisinopril (PRINIVIL,ZESTRIL) 10 MG tablet Take 10 mg by mouth at bedtime., Starting Tue 02/20/2017, Historical Med    meclizine (ANTIVERT) 25 MG tablet Take 25 mg by mouth 3 (three) times daily as needed for dizziness., Historical Med    metoprolol succinate (TOPROL-XL) 25 MG 24 hr tablet Take 25 mg by mouth daily., Historical Med    nitroGLYCERIN (NITROSTAT) 0.4 MG SL tablet Place 1 tablet (0.4 mg total) under the tongue every 5 (five) minutes as needed for chest pain., Starting Tue 02/27/2017, Print    pantoprazole (PROTONIX) 40 MG tablet Take 40 mg by mouth daily., Historical Med    sitaGLIPtin (JANUVIA) 50 MG tablet Take 100 mg by mouth daily., Historical Med    fluticasone (FLONASE) 50 MCG/ACT nasal spray Place 1 spray into both nostrils daily., Historical Med        Today   VITAL SIGNS:  Blood pressure 123/67, pulse (!) 59, temperature (!) 97.5 F (36.4 C), temperature source Oral, resp. rate 16, height  (1.778 m), weight 102.1 kg (225 lb), SpO2 96 %.  I/O:  No intake or output data in the 24 hours ending 03/17/17 1133  PHYSICAL EXAMINATION:  Physical Exam  GENERAL:  66 y.o.-year-old patient lying in the bed with no acute distress.  LUNGS: Normal breath sounds bilaterally, no wheezing, rales,rhonchi or crepitation. No use of accessory muscles of respiration.  CARDIOVASCULAR: S1, S2 normal. No murmurs, rubs, or gallops.  ABDOMEN: Soft, non-tender, non-distended. Bowel sounds present. No organomegaly or mass.  NEUROLOGIC: Moves all 4 extremities. PSYCHIATRIC: The patient is alert and oriented x 3.  SKIN: No obvious  rash, lesion, or ulcer.   DATA REVIEW:   CBC  Recent Labs Lab 03/13/17 0540 03/14/17 0502  WBC 10.0  --   HGB 13.4 13.4  HCT 38.4*  --   PLT 188  --     Chemistries   Recent Labs Lab 03/12/17 1959 03/13/17 0540  NA 139 142  K 3.9 3.2*  CL 106 105  CO2 24 30  GLUCOSE 244* 142*  BUN 13 12  CREATININE 0.64 0.58*  CALCIUM 8.7* 8.9  MG  --  1.7  AST 38  --   ALT 21  --   ALKPHOS 74  --   BILITOT 1.2  --     Cardiac Enzymes No results for input(s): TROPONINI in the last 168 hours.  Microbiology Results  No results found for this or any previous visit.  RADIOLOGY:  No results found.  Follow up with PCP in 1 week.  Management plans discussed with the patient, family and they are in agreement.  CODE STATUS:  Code Status History    Date Active Date Inactive Code Status Order ID Comments User Context   03/13/2017  1:41 AM 03/14/2017  3:32 PM Full Code [id number]  Christopher Manis, MD Inpatient   02/24/2017  4:20 AM 02/27/2017  2:10 PM Full Code [id number]  Arnaldo Natal, MD Inpatient      TOTAL TIME TAKING CARE OF THIS PATIENT ON DAY OF DISCHARGE: more than 30 minutes.   Milagros Loll R M.D on 03/17/2017 at 11:33 AM  Between 7am to 6pm - Pager - [phone]  After 6pm go to [email or web address] - password [email or web address]  [facility]  Office  [phone]  CC: Primary care physician; System, Pcp Not In  Note: This dictation was prepared with Dragon dictation along with smaller phrase technology. Any transcriptional errors that result from this process are unintentional.

## 2017-03-31 ENCOUNTER — Inpatient Hospital Stay (HOSPITAL_COMMUNITY)
Admission: EM | Admit: 2017-03-31 | Discharge: 2017-04-03 | DRG: 248 | Disposition: A | Discharge status: Home / Self Care | Payer: BLUE CROSS/BLUE SHIELD | Attending: Cardiology | Admitting: Cardiology

## 2017-03-31 ENCOUNTER — Emergency Department (HOSPITAL_COMMUNITY): Payer: BLUE CROSS/BLUE SHIELD

## 2017-03-31 ENCOUNTER — Encounter (HOSPITAL_COMMUNITY): Payer: Self-pay | Admitting: Emergency Medicine

## 2017-03-31 DIAGNOSIS — Z7982 Long term (current) use of aspirin: Secondary | ICD-10-CM

## 2017-03-31 DIAGNOSIS — T82855A Stenosis of coronary artery stent, initial encounter: Principal | ICD-10-CM | POA: Diagnosis present

## 2017-03-31 DIAGNOSIS — E118 Type 2 diabetes mellitus with unspecified complications: Secondary | ICD-10-CM

## 2017-03-31 DIAGNOSIS — I10 Essential (primary) hypertension: Secondary | ICD-10-CM

## 2017-03-31 DIAGNOSIS — Z955 Presence of coronary angioplasty implant and graft: Secondary | ICD-10-CM

## 2017-03-31 DIAGNOSIS — E119 Type 2 diabetes mellitus without complications: Secondary | ICD-10-CM | POA: Diagnosis present

## 2017-03-31 DIAGNOSIS — K219 Gastro-esophageal reflux disease without esophagitis: Secondary | ICD-10-CM | POA: Diagnosis present

## 2017-03-31 DIAGNOSIS — I2510 Atherosclerotic heart disease of native coronary artery without angina pectoris: Secondary | ICD-10-CM | POA: Diagnosis present

## 2017-03-31 DIAGNOSIS — Y831 Surgical operation with implant of artificial internal device as the cause of abnormal reaction of the patient, or of later complication, without mention of misadventure at the time of the procedure: Secondary | ICD-10-CM | POA: Diagnosis present

## 2017-03-31 DIAGNOSIS — Z951 Presence of aortocoronary bypass graft: Secondary | ICD-10-CM

## 2017-03-31 DIAGNOSIS — I200 Unstable angina: Secondary | ICD-10-CM

## 2017-03-31 DIAGNOSIS — I110 Hypertensive heart disease with heart failure: Secondary | ICD-10-CM | POA: Diagnosis present

## 2017-03-31 DIAGNOSIS — I4891 Unspecified atrial fibrillation: Secondary | ICD-10-CM

## 2017-03-31 DIAGNOSIS — Z7984 Long term (current) use of oral hypoglycemic drugs: Secondary | ICD-10-CM

## 2017-03-31 DIAGNOSIS — Z952 Presence of prosthetic heart valve: Secondary | ICD-10-CM

## 2017-03-31 DIAGNOSIS — I25110 Atherosclerotic heart disease of native coronary artery with unstable angina pectoris: Secondary | ICD-10-CM

## 2017-03-31 DIAGNOSIS — Z7902 Long term (current) use of antithrombotics/antiplatelets: Secondary | ICD-10-CM

## 2017-03-31 DIAGNOSIS — I5021 Acute systolic (congestive) heart failure: Secondary | ICD-10-CM | POA: Diagnosis present

## 2017-03-31 DIAGNOSIS — I471 Supraventricular tachycardia: Secondary | ICD-10-CM | POA: Diagnosis present

## 2017-03-31 DIAGNOSIS — Z9861 Coronary angioplasty status: Secondary | ICD-10-CM

## 2017-03-31 DIAGNOSIS — I480 Paroxysmal atrial fibrillation: Secondary | ICD-10-CM | POA: Diagnosis present

## 2017-03-31 DIAGNOSIS — I252 Old myocardial infarction: Secondary | ICD-10-CM

## 2017-03-31 DIAGNOSIS — E785 Hyperlipidemia, unspecified: Secondary | ICD-10-CM | POA: Diagnosis present

## 2017-03-31 DIAGNOSIS — Z8249 Family history of ischemic heart disease and other diseases of the circulatory system: Secondary | ICD-10-CM

## 2017-03-31 DIAGNOSIS — Z79899 Other long term (current) drug therapy: Secondary | ICD-10-CM

## 2017-03-31 HISTORY — DX: Hyperlipidemia, unspecified: E78.5

## 2017-03-31 LAB — BASIC METABOLIC PANEL
ANION GAP: 9 (ref 5–15)
BUN: 13 mg/dL (ref 6–20)
CO2: 24 mmol/L (ref 22–32)
Calcium: 9.2 mg/dL (ref 8.9–10.3)
Chloride: 103 mmol/L (ref 101–111)
Creatinine, Ser: 0.86 mg/dL (ref 0.61–1.24)
GFR calc Af Amer: >60 mL/min (ref >60)
GFR calc non Af Amer: >60 mL/min (ref >60)
GLUCOSE: 289 mg/dL — AB (ref 65–99)
POTASSIUM: 3.5 mmol/L (ref 3.5–5.1)
Sodium: 136 mmol/L (ref 135–145)

## 2017-03-31 LAB — CBC
HEMATOCRIT: 40.1 % (ref 39.0–52.0)
HEMOGLOBIN: 13.3 g/dL (ref 13.0–17.0)
MCH: 27.4 pg (ref 26.0–34.0)
MCHC: 33.2 g/dL (ref 30.0–36.0)
MCV: 82.7 fL (ref 78.0–100.0)
Platelets: 215 10*3/uL (ref 150–400)
RBC: 4.85 MIL/uL (ref 4.22–5.81)
RDW: 14.2 % (ref 11.5–15.5)
WBC: 9.6 10*3/uL (ref 4.0–10.5)

## 2017-03-31 LAB — LIPID PANEL
CHOL/HDL RATIO: 2.9 ratio
CHOLESTEROL: 108 mg/dL (ref 0–200)
HDL: 37 mg/dL — ABNORMAL LOW (ref >40)
LDL CALC: 43 mg/dL (ref 0–99)
Triglycerides: 138 mg/dL (ref <150)
VLDL: 28 mg/dL (ref 0–40)

## 2017-03-31 LAB — TSH: TSH: 2.117 u[IU]/mL (ref 0.350–4.500)

## 2017-03-31 LAB — APTT: aPTT: 28 seconds (ref 24–36)

## 2017-03-31 LAB — I-STAT TROPONIN, ED: Troponin i, poc: 0.02 ng/mL (ref 0.00–0.08)

## 2017-03-31 LAB — HEMOGLOBIN A1C
Hgb A1c MFr Bld: 8.1 % — ABNORMAL HIGH (ref 4.8–5.6)
Mean Plasma Glucose: 185.77 mg/dL

## 2017-03-31 LAB — GLUCOSE, CAPILLARY: Glucose-Capillary: 205 mg/dL — ABNORMAL HIGH (ref 65–99)

## 2017-03-31 LAB — MRSA PCR SCREENING: MRSA by PCR: NEGATIVE

## 2017-03-31 LAB — PROTIME-INR
INR: 1.16
Prothrombin Time: 14.7 seconds (ref 11.4–15.2)

## 2017-03-31 LAB — TROPONIN I: Troponin I: 0.04 ng/mL (ref <0.03)

## 2017-03-31 LAB — POCT I-STAT TROPONIN I: TROPONIN I, POC: 0.04 ng/mL (ref 0.00–0.08)

## 2017-03-31 LAB — BRAIN NATRIURETIC PEPTIDE: B Natriuretic Peptide: 171 pg/mL — ABNORMAL HIGH (ref 0.0–100.0)

## 2017-03-31 MED ORDER — HEPARIN (PORCINE) IN NACL 100-0.45 UNIT/ML-% IJ SOLN
1300.0000 [IU]/h | INTRAMUSCULAR | Status: DC
  Administered 2017-03-31 – 2017-04-02 (×2): 1300 [IU]/h via INTRAVENOUS
  Filled 2017-03-31 (×3): qty 250

## 2017-03-31 MED ORDER — METOPROLOL SUCCINATE ER 25 MG PO TB24: 25.0000 mg | ORAL_TABLET | Freq: Every day | ORAL | Status: DC

## 2017-03-31 MED ORDER — NITROGLYCERIN IN D5W 200-5 MCG/ML-% IV SOLN
0.0000 ug/min | Freq: Once | INTRAVENOUS | Status: AC
  Administered 2017-03-31: 5 ug/min via INTRAVENOUS
  Filled 2017-03-31: qty 250

## 2017-03-31 MED ORDER — CLOPIDOGREL BISULFATE 75 MG PO TABS
75.0000 mg | ORAL_TABLET | Freq: Every day | ORAL | Status: DC
  Administered 2017-04-01 – 2017-04-02 (×2): 75 mg via ORAL
  Filled 2017-03-31 (×2): qty 1

## 2017-03-31 MED ORDER — NITROGLYCERIN 0.4 MG SL SUBL
0.4000 mg | SUBLINGUAL_TABLET | SUBLINGUAL | Status: DC | PRN
  Administered 2017-03-31: 0.4 mg via SUBLINGUAL
  Filled 2017-03-31: qty 1

## 2017-03-31 MED ORDER — FLUTICASONE PROPIONATE 50 MCG/ACT NA SUSP
1.0000 | Freq: Every day | NASAL | Status: DC
  Administered 2017-04-02: 1 via NASAL
  Filled 2017-03-31 (×2): qty 16

## 2017-03-31 MED ORDER — PANTOPRAZOLE SODIUM 40 MG PO TBEC
40.0000 mg | DELAYED_RELEASE_TABLET | Freq: Every day | ORAL | Status: DC
  Administered 2017-04-01 – 2017-04-03 (×3): 40 mg via ORAL
  Filled 2017-03-31 (×3): qty 1

## 2017-03-31 MED ORDER — INSULIN ASPART 100 UNIT/ML ~~LOC~~ SOLN
0.0000 [IU] | Freq: Three times a day (TID) | SUBCUTANEOUS | Status: DC
  Administered 2017-04-01: 2 [IU] via SUBCUTANEOUS
  Administered 2017-04-01: 3 [IU] via SUBCUTANEOUS
  Administered 2017-04-01: 2 [IU] via SUBCUTANEOUS
  Administered 2017-04-02: 1 [IU] via SUBCUTANEOUS
  Administered 2017-04-02 – 2017-04-03 (×2): 2 [IU] via SUBCUTANEOUS
  Administered 2017-04-03: 3 [IU] via SUBCUTANEOUS

## 2017-03-31 MED ORDER — HEPARIN BOLUS VIA INFUSION
2000.0000 [IU] | Freq: Once | INTRAVENOUS | Status: AC
  Administered 2017-03-31: 2000 [IU] via INTRAVENOUS
  Filled 2017-03-31: qty 2000

## 2017-03-31 MED ORDER — MORPHINE SULFATE (PF) 4 MG/ML IV SOLN
4.0000 mg | Freq: Once | INTRAVENOUS | Status: AC
  Administered 2017-03-31: 4 mg via INTRAVENOUS
  Filled 2017-03-31: qty 1

## 2017-03-31 MED ORDER — ASPIRIN EC 81 MG PO TBEC
162.0000 mg | DELAYED_RELEASE_TABLET | Freq: Every day | ORAL | Status: DC
  Administered 2017-04-01 – 2017-04-02 (×2): 162 mg via ORAL
  Filled 2017-03-31 (×2): qty 2

## 2017-03-31 MED ORDER — ASPIRIN EC 81 MG PO TBEC: 81.0000 mg | DELAYED_RELEASE_TABLET | Freq: Every day | ORAL | Status: DC

## 2017-03-31 MED ORDER — LISINOPRIL 10 MG PO TABS
10.0000 mg | ORAL_TABLET | Freq: Every day | ORAL | Status: DC
  Administered 2017-03-31 – 2017-04-01 (×2): 10 mg via ORAL
  Filled 2017-03-31 (×2): qty 1

## 2017-03-31 MED ORDER — INSULIN ASPART 100 UNIT/ML ~~LOC~~ SOLN
0.0000 [IU] | Freq: Every day | SUBCUTANEOUS | Status: DC
  Administered 2017-03-31 – 2017-04-01 (×2): 2 [IU] via SUBCUTANEOUS

## 2017-03-31 MED ORDER — ATORVASTATIN CALCIUM 80 MG PO TABS
80.0000 mg | ORAL_TABLET | Freq: Every day | ORAL | Status: DC
  Administered 2017-03-31 – 2017-04-01 (×2): 80 mg via ORAL
  Filled 2017-03-31 (×2): qty 1

## 2017-03-31 MED ORDER — ASPIRIN 81 MG PO CHEW: 324.0000 mg | CHEWABLE_TABLET | Freq: Once | ORAL | Status: DC

## 2017-03-31 MED ORDER — ISOSORBIDE MONONITRATE ER 30 MG PO TB24
30.0000 mg | ORAL_TABLET | Freq: Every day | ORAL | Status: DC
  Administered 2017-03-31 – 2017-04-03 (×4): 30 mg via ORAL
  Filled 2017-03-31 (×4): qty 1

## 2017-03-31 MED ORDER — MECLIZINE HCL 25 MG PO TABS: 25.0000 mg | ORAL_TABLET | Freq: Three times a day (TID) | ORAL | Status: DC | PRN

## 2017-03-31 MED ORDER — NITROGLYCERIN 0.4 MG SL SUBL
0.4000 mg | SUBLINGUAL_TABLET | SUBLINGUAL | Status: DC | PRN
  Administered 2017-04-01 (×4): 0.4 mg via SUBLINGUAL
  Filled 2017-03-31 (×3): qty 1

## 2017-03-31 MED ORDER — METOPROLOL SUCCINATE ER 50 MG PO TB24
50.0000 mg | ORAL_TABLET | Freq: Every day | ORAL | Status: DC
  Administered 2017-04-01 – 2017-04-03 (×3): 50 mg via ORAL
  Filled 2017-03-31 (×3): qty 1

## 2017-03-31 NOTE — ED Notes (Signed)
Attempted to call report

## 2017-03-31 NOTE — Progress Notes (Signed)
ANTICOAGULATION CONSULT NOTE - Initial Consult  Pharmacy Consult for Heparin Indication: chest pain/ACS  Allergies  Allergen Reactions  . Metformin And Related Other (See Comments)    Only the regular Metformin    Patient Measurements: Height:  (177.8 cm) Weight: 225 lb (102.1 kg) IBW/kg (Calculated) : 73 Heparin Dosing Weight:  94.3 kg  Vital Signs: Temp: 98.7 F (37.1 C) (09/29 1615) Temp Source: Oral (09/29 1615) BP: 131/78 (09/29 1630) Pulse Rate: 117 (09/29 1630)  Labs:  Recent Labs  03/31/17 1615  HGB 13.3  HCT 40.1  PLT 215    Estimated Creatinine Clearance: 111.6 mL/min (A) (by C-G formula based on SCr of 0.58 mg/dL (L)).   Medical History: Past Medical History:  Diagnosis Date  . CAD (coronary artery disease)   . Colitis   . Diabetes mellitus without complication (HCC)   . Diverticulitis   . Hypertension     Medications:  See med rec  Assessment: CC/HPI: CP radiating into L arm and neck. MI 3 wks ago. Troponin 0.02  PMH: HTN, DM, CAD s/p CABG and valve replacement 1/17, h/o Afib, h/o GERD, obesity, hearing impaired, OSA, rectal bleeding  Significant events: BRBPR at [facility] this month. He had a recent colonoscopy and says he had a large polyp in the rectum that was not resected. He was also diagnosed with diverticulosis of the colon and advised that he may have had a diverticular bleed. At that time he was told that the rectal polyp may even be cancerous. Discharged 03/14/17.  Was to supposed to f/u as outpatient for colonoscopy at [facility].  Anticoag: IV heparin for CP. CBC WNL  Goal of Therapy:  Heparin level 0.3-0.7 units/ml Monitor platelets by anticoagulation protocol: Yes   Plan:  Heparin 1/2 bolus at 2000 units due to recent rectal bleeding. IV heparin infusion at 1300 units/hr Check heparin level in 6-8 hrs. Daily HL and CBC  Jackolyn Geron S. Merilynn Finland, PharmD, BCPS Clinical Staff Pharmacist Pager [phone]  Misty Stanley Stillinger 03/31/2017,4:49 PM

## 2017-03-31 NOTE — ED Provider Notes (Signed)
[facility]-EM[facility]ENCY DEPT Provider Note   CSN: [id number] Arrival date & time: 03/31/17  1606     History   Chief Complaint Chief Complaint  Patient presents with  . Chest Pain    HPI Alger Kerstein Sr. is a 66 y.o. male.  HPI Pt with hx of CAD, DM, HTN comes in with cc of chest pain. Pt reports that he was sitting in his car when suddenly he started having generalized chest pain that is similar to his ACS pain. Pts pain is radiating to the neck and to the L shoulder. Pt has no dib. Pt had a stent placed just 2 weeks ago at [facility] for rising troponins.  Past Medical History:  Diagnosis Date  . CAD (coronary artery disease)   . Colitis   . Diabetes mellitus without complication (HCC)   . Diverticulitis   . Hypertension     Patient Active Problem List   Diagnosis Date Noted  . BRBPR (bright red blood per rectum) 03/12/2017  . HTN (hypertension) 03/12/2017  . Diabetes (HCC) 03/12/2017  . CAD (coronary artery disease) 03/12/2017  . Chest pain 02/24/2017  . NSTEMI (non-ST elevated myocardial infarction) (HCC) 02/24/2017    Past Surgical History:  Procedure Laterality Date  . AORTIC VALVE REPLACEMENT    . CORONARY ARTERY BYPASS GRAFT    . CORONARY STENT INTERVENTION N/A 02/26/2017   Procedure: CORONARY STENT INTERVENTION;  Surgeon: Iran Ouch, MD;  Location: [facility] INVASIVE CV LAB;  Service: Cardiovascular;  Laterality: N/A;  . CORONARY/GRAFT ANGIOGRAPHY N/A 02/26/2017   Procedure: CORONARY/GRAFT ANGIOGRAPHY;  Surgeon: Iran Ouch, MD;  Location: [facility] INVASIVE CV LAB;  Service: Cardiovascular;  Laterality: N/A;       Home Medications    Prior to Admission medications   Medication Sig Start Date End Date Taking? Authorizing Provider  aspirin EC 81 MG tablet Take 162 mg by mouth daily.    Yes [provider]  atorvastatin (LIPITOR) 80 MG tablet Take 80 mg by mouth at bedtime.    Yes [provider]  clopidogrel (PLAVIX) 75  MG tablet Take 75 mg by mouth daily.   Yes [provider]  fluticasone (FLONASE) 50 MCG/ACT nasal spray Place 1 spray into both nostrils daily.   Yes [provider]  lisinopril (PRINIVIL,ZESTRIL) 10 MG tablet Take 10 mg by mouth at bedtime. 02/20/17  Yes [provider]  meclizine (ANTIVERT) 25 MG tablet Take 25 mg by mouth 3 (three) times daily as needed for dizziness.   Yes [provider]  metFORMIN (GLUCOPHAGE-XR) 500 MG 24 hr tablet Take 500 mg by mouth. 03/22/17 03/22/18 Yes [provider]  metoprolol succinate (TOPROL-XL) 25 MG 24 hr tablet Take 25 mg by mouth daily.   Yes [provider]  nitroGLYCERIN (NITROSTAT) 0.4 MG SL tablet Place 1 tablet (0.4 mg total) under the tongue every 5 (five) minutes as needed for chest pain. 02/27/17  Yes Shaune Pollack, MD  pantoprazole (PROTONIX) 40 MG tablet Take 40 mg by mouth daily.   Yes [provider]    Family History Family History  Problem Relation Age of Onset  . CAD Mother   . Heart failure Mother   . CAD Brother     Social History Social History  Substance Use Topics  . Smoking status: Never Smoker  . Smokeless tobacco: Never Used  . Alcohol use No     Allergies   Metformin and related   Review of Systems Review of  Systems  Constitutional: Positive for activity change.  Respiratory: Positive for chest tightness. Negative for cough and shortness of breath.   Cardiovascular: Positive for chest pain.  Gastrointestinal: Negative for abdominal distention.  Genitourinary: Negative for dysuria.  Musculoskeletal: Positive for neck pain.  Neurological: Negative for light-headedness and headaches.  Psychiatric/Behavioral: Negative for confusion.  All other systems reviewed and are negative.    Physical Exam Updated Vital Signs BP 112/75 (BP Location: Right Arm)   Pulse (!) 117   Temp 98.7 F (37.1 C) (Oral)   Resp (!) 22   Ht  (1.778 m)   Wt 102.1 kg (225  lb)   SpO2 99%   BMI 32.28 kg/m   Physical Exam  Constitutional: He is oriented to person, place, and time. He appears well-developed.  HENT:  Head: Atraumatic.  Neck: Neck supple. No JVD present.  Cardiovascular: Normal rate.   Pulmonary/Chest: Effort normal.  Abdominal: Soft. There is no tenderness.  Musculoskeletal: He exhibits no edema.  Neurological: He is alert and oriented to person, place, and time.  Skin: Skin is warm.  Nursing note and vitals reviewed.    ED Treatments / Results  Labs (all labs ordered are listed, but only abnormal results are displayed) Labs Reviewed  BASIC METABOLIC PANEL - Abnormal; Notable for the following:       Result Value   Glucose, Bld 289 (*)    All other components within normal limits  CBC  PROTIME-INR  APTT  HEPARIN LEVEL (UNFRACTIONATED)  HEPARIN LEVEL (UNFRACTIONATED)  CBC  I-STAT TROPONIN, ED  I-STAT TROPONIN, ED  CBG MONITORING, ED    EKG  EKG Interpretation  Date/Time:  Saturday March 31 2017 17:54:16 EDT Ventricular Rate:  117 PR Interval:    QRS Duration: 92 QT Interval:  355 QTC Calculation: 496 R Axis:   15 Text Interpretation:  Sinus or ectopic atrial tachycardia Abnormal R-wave progression, late transition Repol abnrm suggests ischemia, lateral leads Minimal ST elevation, inferior leads No acute changes Confirmed by Derwood Kaplan [phone]) on 03/31/2017 6:00:05 PM       ED ECG REPORT   Date: 03/31/2017  Rate: 118  Rhythm: atrial fibrillation  QRS Axis: normal  Intervals: normal  ST/T Wave abnormalities: ST elevations inferiorly  Conduction Disutrbances:none  Narrative Interpretation:   Old EKG Reviewed: unchanged  I have personally reviewed the EKG tracing and agree with the computerized printout as noted.   EKG Interpretation  Date/Time:  Saturday March 31 2017 17:54:16 EDT Ventricular Rate:  117 PR Interval:    QRS Duration: 92 QT Interval:  355 QTC Calculation: 496 R  Axis:   15 Text Interpretation:  Sinus or ectopic atrial tachycardia Abnormal R-wave progression, late transition Repol abnrm suggests ischemia, lateral leads Minimal ST elevation, inferior leads No acute changes Confirmed by Derwood Kaplan [phone]) on 03/31/2017 6:00:05 PM        Radiology Dg Chest Portable 1 View  Result Date: 03/31/2017 CLINICAL DATA:  Chest pain EXAM: PORTABLE CHEST 1 VIEW COMPARISON:  02/24/2017. FINDINGS: Previous median sternotomy and CABG procedure. Mild cardiac enlargement. Both lungs are clear. The visualized skeletal structures are unremarkable. IMPRESSION: No active disease. Electronically Signed   By: Signa Kell M.D.   On: 03/31/2017 17:35    Procedures Procedures (including critical care time)  CRITICAL CARE Performed by: Derwood Kaplan   Total critical care time: 38 minutes  Critical care time was exclusive of separately billable procedures and treating other patients.  Critical care was necessary  to treat or prevent imminent or life-threatening deterioration.  Critical care was time spent personally by me on the following activities: development of treatment plan with patient and/or surrogate as well as nursing, discussions with consultants, evaluation of patient's response to treatment, examination of patient, obtaining history from patient or surrogate, ordering and performing treatments and interventions, ordering and review of laboratory studies, ordering and review of radiographic studies, pulse oximetry and re-evaluation of patient's condition.   Medications Ordered in ED Medications  aspirin chewable tablet 324 mg (324 mg Oral Not Given 03/31/17 1640)  nitroGLYCERIN (NITROSTAT) SL tablet 0.4 mg (0.4 mg Sublingual Given 03/31/17 1701)  heparin ADULT infusion 100 units/mL (25000 units/246mL sodium chloride 0.45%) (1,300 Units/hr Intravenous New Bag/Given 03/31/17 1731)  heparin bolus via infusion 2,000 Units (2,000 Units Intravenous Bolus from  Bag 03/31/17 1731)  nitroGLYCERIN 50 mg in dextrose 5 % 250 mL (0.2 mg/mL) infusion (5 mcg/min Intravenous New Bag/Given 03/31/17 1738)  morphine 4 MG/ML injection 4 mg (4 mg Intravenous Given 03/31/17 1757)     Initial Impression / Assessment and Plan / ED Course  I have reviewed the triage vital signs and the nursing notes.  Pertinent labs & imaging results that were available during my care of the patient were reviewed by me and considered in my medical decision making (see chart for details).  Clinical Course as of Mar 31 1799  Sat Mar 31, 2017  1732 Chest pain persistent. Trop is neg, but I am still concerned that the underlying cause is ACS, and so we have consulted Cards. Heparin and nitro drips ordered.  [AN]    Clinical Course User Index [AN] Derwood Kaplan, MD    Pt comes in with cc of chest pain. Chest pain is typical for his ACS - so we will start him on nitro and heparin drip. EKG has non specific ST elevations, but they are not new compared to last EKG.  Final Clinical Impressions(s) / ED Diagnoses   Final diagnoses:  Unstable angina (HCC)  Atrial fibrillation, unspecified type [facility])    New Prescriptions New Prescriptions   No medications on file     Derwood Kaplan, MD 03/31/17 1800

## 2017-03-31 NOTE — H&P (Addendum)
CARDIOLOGY OBSERVATION HISTORY AND PHYSICAL EXAMINATION NOTE  Patient ID: Christopher Else Sr. MRN: [id number], DOB/AGE: 12/27/1950   Admit date: 03/31/2017   Primary Physician: System, Pcp Not In Primary Cardiologist: Lorine Bears MD  Reason for admission: chest pain   HPI: This is a 66 y.o. male with known to have NSTEMI s/p CABG, recent cath for NSTEMI requiring POBA for Lcx ISR, bioprosthetic aortic valve and CABG with LIMA, HTN, DM2, recent BRBPR from diverticuli on 03/14/2017.   Patient wake up with stiffness in the left arm and neck in the morning when he woke up. It improved afterwards. [place] he developed the neck pain and the arm pain along with left sided chest pain. The chest pain is still 2/10. It is similar to his pain when he had nstemi. Patient denied SOB, diaphoresis, n/v, PE history otherwise.  Of note, patient had an episode of nstemi on 8/26 and underwent cath by Dr. Kirke Corin at which time had had obstructive ISR lesions of the LCx that required POBA. There was residual mild stenosis post POBA.   In the ED, first troponin was negative.  Cardiographics EKG 03/31/17 NSR with non swpecific T wave changes Echo 02/25/2017 - Procedure narrative: Transthoracic echocardiography. Image   quality was suboptimal. The study was technically difficult, as a   result of poor acoustic windows and poor sound wave transmission. - Left ventricle: LV systolic function is grossly normal Cannot   evaluate regional wall motion Poor acoustic windows Endocardium   is not well seen. The cavity size was mildly dilated. Wall   thickness was normal. - Aortic valve: AV prosthesis appears to open well Peak and mean   gradients through the valve are 18 and 10 mm Hg respectively. - Left atrium: The atrium was mildly dilated. - Right atrium: The atrium was mildly dilated. - Pericardium, extracardiac: A trivial pericardial effusion was   identified. Cath:  1.  Significant underlying three-vessel coronary artery disease with patent LIMA to LAD. Patent RCA stent with no significant restenosis. Significant in-stent restenosis in the proximal and distal left circumflex. Significant in-stent restenosis in first diagonal which has diffuse disease in a long segment. 2. The aortic valve was not crossed. It was normal by echo with normal EF. 3. Successful balloon angioplasty of the proximal and distal left circumflex in-stent restenosis.  Problem List: Past Medical History:  Diagnosis Date  . CAD (coronary artery disease)   . Colitis   . Diabetes mellitus without complication (HCC)   . Diverticulitis   . Hypertension     Past Surgical History:  Procedure Laterality Date  . AORTIC VALVE REPLACEMENT    . CORONARY ARTERY BYPASS GRAFT    . CORONARY STENT INTERVENTION N/A 02/26/2017   Procedure: CORONARY STENT INTERVENTION;  Surgeon: Iran Ouch, MD;  Location: [facility] INVASIVE CV LAB;  Service: Cardiovascular;  Laterality: N/A;  . CORONARY/GRAFT ANGIOGRAPHY N/A 02/26/2017   Procedure: CORONARY/GRAFT ANGIOGRAPHY;  Surgeon: Iran Ouch, MD;  Location: [facility] INVASIVE CV LAB;  Service: Cardiovascular;  Laterality: N/A;     Allergies:  Allergies  Allergen Reactions  . Metformin And Related Other (See Comments)    Only the regular Metformin     Home Medications Current Facility-Administered Medications  Medication Dose Route Frequency Provider Last Rate Last Dose  . aspirin chewable tablet 324 mg  324 mg Oral Once Nanavati, Ankit, MD      . heparin ADULT infusion 100 units/mL (25000 units/240mL sodium chloride  0.45%)  1,300 Units/hr Intravenous Continuous Norva Pavlov, RPH 13 mL/hr at 03/31/17 1731 1,300 Units/hr at 03/31/17 1731  . morphine 4 MG/ML injection 4 mg  4 mg Intravenous Once Nanavati, Ankit, MD      . nitroGLYCERIN (NITROSTAT) SL tablet 0.4 mg  0.4 mg Sublingual Q5 Min x 3 PRN Derwood Kaplan, MD   0.4 mg at 03/31/17 1701    Current Outpatient Prescriptions  Medication Sig Dispense Refill  . aspirin EC 81 MG tablet Take 162 mg by mouth daily.     Marland Kitchen atorvastatin (LIPITOR) 80 MG tablet Take 80 mg by mouth at bedtime.     . clopidogrel (PLAVIX) 75 MG tablet Take 75 mg by mouth daily.    . fluticasone (FLONASE) 50 MCG/ACT nasal spray Place 1 spray into both nostrils daily.    Marland Kitchen lisinopril (PRINIVIL,ZESTRIL) 10 MG tablet Take 10 mg by mouth at bedtime.  3  . meclizine (ANTIVERT) 25 MG tablet Take 25 mg by mouth 3 (three) times daily as needed for dizziness.    . metFORMIN (GLUCOPHAGE-XR) 500 MG 24 hr tablet Take 500 mg by mouth.    . metoprolol succinate (TOPROL-XL) 25 MG 24 hr tablet Take 25 mg by mouth daily.    . nitroGLYCERIN (NITROSTAT) 0.4 MG SL tablet Place 1 tablet (0.4 mg total) under the tongue every 5 (five) minutes as needed for chest pain. 30 tablet 2  . pantoprazole (PROTONIX) 40 MG tablet Take 40 mg by mouth daily.       Family History  Problem Relation Age of Onset  . CAD Mother   . Heart failure Mother   . CAD Brother      Social History   Social History  . Marital status: Divorced    Spouse name: N/A  . Number of children: N/A  . Years of education: N/A   Occupational History  . Not on file.   Social History Main Topics  . Smoking status: Never Smoker  . Smokeless tobacco: Never Used  . Alcohol use No  . Drug use: No  . Sexual activity: Not on file   Other Topics Concern  . Not on file   Social History Narrative  . No narrative on file     Review of Systems: General: negative for chills, fever, night sweats or weight changes.  Cardiovascular: chest pain, dyspnea  Dermatological: negative for rash Respiratory: negative for cough or wheezing Urologic: negative for hematuria Abdominal: recent red blood per rectum stopped DAPT for 2 days but is back on it.  Neurologic: negative for visual changes, syncope, or dizziness Endocrine: no diabetes, no  hypothyroidism Immunological: no lymph adenopathy Psych: non homicidal/suicidal  Physical Exam: Vitals: BP 112/73   Pulse (!) 117   Temp 98.7 F (37.1 C) (Oral)   Resp (!) 25   Ht  (1.778 m)   Wt 102.1 kg (225 lb)   SpO2 99%   BMI 32.28 kg/m  General: not in acute distress Neck: JVP flat, neck supple Heart: regular rate and rhythm, S1, S2, systolic grade III/VI ejection murmur at aortic area Lungs: CTAB  GI: non tender, non distended, bowel sounds present Extremities: no edema Neuro: AAO x 3  Psych: normal affect, no anxiety   Labs:   Results for orders placed or performed during the hospital encounter of 03/31/17 (from the past 24 hour(s))  Basic metabolic panel     Status: Abnormal   Collection Time: 03/31/17  4:15 PM  Result Value  Ref Range   Sodium 136 135 - 145 mmol/L   Potassium 3.5 3.5 - 5.1 mmol/L   Chloride 103 101 - 111 mmol/L   CO2 24 22 - 32 mmol/L   Glucose, Bld 289 (H) 65 - 99 mg/dL   BUN 13 6 - 20 mg/dL   Creatinine, Ser 1.61 0.61 - 1.24 mg/dL   Calcium 9.2 8.9 - 09.6 mg/dL   GFR calc non Af Amer >60 >60 mL/min   GFR calc Af Amer >60 >60 mL/min   Anion gap 9 5 - 15  CBC     Status: None   Collection Time: 03/31/17  4:15 PM  Result Value Ref Range   WBC 9.6 4.0 - 10.5 K/uL   RBC 4.85 4.22 - 5.81 MIL/uL   Hemoglobin 13.3 13.0 - 17.0 g/dL   HCT 04.5 40.9 - 81.1 %   MCV 82.7 78.0 - 100.0 fL   MCH 27.4 26.0 - 34.0 pg   MCHC 33.2 30.0 - 36.0 g/dL   RDW 91.4 78.2 - 95.6 %   Platelets 215 150 - 400 K/uL  Protime-INR     Status: None   Collection Time: 03/31/17  4:15 PM  Result Value Ref Range   Prothrombin Time 14.7 11.4 - 15.2 seconds   INR 1.16   APTT     Status: None   Collection Time: 03/31/17  4:15 PM  Result Value Ref Range   aPTT 28 24 - 36 seconds  I-stat troponin, ED     Status: None   Collection Time: 03/31/17  4:28 PM  Result Value Ref Range   Troponin i, poc 0.02 0.00 - 0.08 ng/mL   Comment 3             Radiology/Studies:  Dg Chest Portable 1 View  Result Date: 03/31/2017 CLINICAL DATA:  Chest pain EXAM: PORTABLE CHEST 1 VIEW COMPARISON:  02/24/2017. FINDINGS: Previous median sternotomy and CABG procedure. Mild cardiac enlargement. Both lungs are clear. The visualized skeletal structures are unremarkable. IMPRESSION: No active disease. Electronically Signed   By: Signa Kell M.D.   On: 03/31/2017 17:35     Medical decision making:  Discussed care with the patient Discussed care with the physician on the phone Reviewed labs and imaging personally Reviewed prior records  ASSESSMENT AND PLAN:  This is a 66 y.o. male with known history of CAD and CABG w/LIMA presented with unstable angina    Principal Problem:   Unstable angina (HCC) Active Problems:   HTN (hypertension)   Diabetes (HCC)   CAD (coronary artery disease)  Unstable angina  Unstable angina  increasing episodes of chest pain, recent at rest pain  - admit to telemetry floor  - recommend IV heparin - cycle troponin - echocardiogram in the AM - NPO post midnight - CBC, CMP, INR/PTT - TSH, HbA1c, lipid panel - increased dose of  Metoprolol to 50 mg and started on imdur 30 mg daily - if symptoms persist, consider repeating cath after discussing with Dr. Kirke Corin  Diabetes mellitus  SSI, finger sticks No oral hypoglycemics HbA1c  Recent history of GI bleeding, no active bleeding - will keep PTT <70  Sinus tachycardia - increased dose of metoprolol  Hypertension, essential Continue lisinopril and metoprolol  Goal <140/90  GERD - continue protonix  Coronary artery disease - aspirin, plavix, high dose statin, bb   Signed, Joellyn Rued, MD MS 03/31/2017, 5:49 PM

## 2017-03-31 NOTE — ED Triage Notes (Addendum)
Hx of MI 3 weeks ago. Significant cardiac history. Pt states this morning woke up with stiff neck and left arm stiffness. [place] around [place] today. Then began having chest pain radiating into left arm and neck. Pain 8/10. Nitro X4 brought pain down to 6/10. 18G PIV placed to L wrist. 324 Asprin given by EMS. CBG 238 by EMS

## 2017-04-01 ENCOUNTER — Other Ambulatory Visit (HOSPITAL_COMMUNITY): Payer: Medicaid Other

## 2017-04-01 ENCOUNTER — Other Ambulatory Visit: Payer: Self-pay

## 2017-04-01 DIAGNOSIS — K219 Gastro-esophageal reflux disease without esophagitis: Secondary | ICD-10-CM | POA: Diagnosis present

## 2017-04-01 DIAGNOSIS — I5021 Acute systolic (congestive) heart failure: Secondary | ICD-10-CM | POA: Diagnosis present

## 2017-04-01 DIAGNOSIS — I480 Paroxysmal atrial fibrillation: Secondary | ICD-10-CM | POA: Diagnosis not present

## 2017-04-01 DIAGNOSIS — R072 Precordial pain: Secondary | ICD-10-CM | POA: Diagnosis not present

## 2017-04-01 DIAGNOSIS — I25110 Atherosclerotic heart disease of native coronary artery with unstable angina pectoris: Secondary | ICD-10-CM | POA: Diagnosis not present

## 2017-04-01 DIAGNOSIS — Z951 Presence of aortocoronary bypass graft: Secondary | ICD-10-CM | POA: Diagnosis not present

## 2017-04-01 DIAGNOSIS — Z8249 Family history of ischemic heart disease and other diseases of the circulatory system: Secondary | ICD-10-CM | POA: Diagnosis not present

## 2017-04-01 DIAGNOSIS — Z952 Presence of prosthetic heart valve: Secondary | ICD-10-CM | POA: Diagnosis not present

## 2017-04-01 DIAGNOSIS — E1159 Type 2 diabetes mellitus with other circulatory complications: Secondary | ICD-10-CM | POA: Diagnosis not present

## 2017-04-01 DIAGNOSIS — E785 Hyperlipidemia, unspecified: Secondary | ICD-10-CM | POA: Diagnosis present

## 2017-04-01 DIAGNOSIS — Z7982 Long term (current) use of aspirin: Secondary | ICD-10-CM | POA: Diagnosis not present

## 2017-04-01 DIAGNOSIS — I110 Hypertensive heart disease with heart failure: Secondary | ICD-10-CM | POA: Diagnosis present

## 2017-04-01 DIAGNOSIS — Z955 Presence of coronary angioplasty implant and graft: Secondary | ICD-10-CM | POA: Diagnosis not present

## 2017-04-01 DIAGNOSIS — I471 Supraventricular tachycardia: Secondary | ICD-10-CM | POA: Diagnosis present

## 2017-04-01 DIAGNOSIS — Z7902 Long term (current) use of antithrombotics/antiplatelets: Secondary | ICD-10-CM | POA: Diagnosis not present

## 2017-04-01 DIAGNOSIS — I200 Unstable angina: Secondary | ICD-10-CM | POA: Diagnosis not present

## 2017-04-01 DIAGNOSIS — T82855A Stenosis of coronary artery stent, initial encounter: Secondary | ICD-10-CM | POA: Diagnosis present

## 2017-04-01 DIAGNOSIS — I25118 Atherosclerotic heart disease of native coronary artery with other forms of angina pectoris: Secondary | ICD-10-CM | POA: Diagnosis not present

## 2017-04-01 DIAGNOSIS — Z7984 Long term (current) use of oral hypoglycemic drugs: Secondary | ICD-10-CM | POA: Diagnosis not present

## 2017-04-01 DIAGNOSIS — Y831 Surgical operation with implant of artificial internal device as the cause of abnormal reaction of the patient, or of later complication, without mention of misadventure at the time of the procedure: Secondary | ICD-10-CM | POA: Diagnosis present

## 2017-04-01 DIAGNOSIS — Z79899 Other long term (current) drug therapy: Secondary | ICD-10-CM | POA: Diagnosis not present

## 2017-04-01 DIAGNOSIS — I10 Essential (primary) hypertension: Secondary | ICD-10-CM | POA: Diagnosis not present

## 2017-04-01 DIAGNOSIS — I252 Old myocardial infarction: Secondary | ICD-10-CM | POA: Diagnosis not present

## 2017-04-01 DIAGNOSIS — E119 Type 2 diabetes mellitus without complications: Secondary | ICD-10-CM | POA: Diagnosis present

## 2017-04-01 LAB — COMPREHENSIVE METABOLIC PANEL
ALT: 19 U/L (ref 17–63)
AST: 21 U/L (ref 15–41)
Albumin: 3.2 g/dL — ABNORMAL LOW (ref 3.5–5.0)
Alkaline Phosphatase: 68 U/L (ref 38–126)
Anion gap: 10 (ref 5–15)
BILIRUBIN TOTAL: 1 mg/dL (ref 0.3–1.2)
BUN: 11 mg/dL (ref 6–20)
CALCIUM: 8.8 mg/dL — AB (ref 8.9–10.3)
CO2: 24 mmol/L (ref 22–32)
CREATININE: 0.68 mg/dL (ref 0.61–1.24)
Chloride: 102 mmol/L (ref 101–111)
GFR calc Af Amer: >60 mL/min (ref >60)
Glucose, Bld: 205 mg/dL — ABNORMAL HIGH (ref 65–99)
Potassium: 3.4 mmol/L — ABNORMAL LOW (ref 3.5–5.1)
Sodium: 136 mmol/L (ref 135–145)
Total Protein: 5.9 g/dL — ABNORMAL LOW (ref 6.5–8.1)

## 2017-04-01 LAB — CBC WITH DIFFERENTIAL/PLATELET
Basophils Absolute: 0.1 10*3/uL (ref 0.0–0.1)
Basophils Relative: 1 %
EOS ABS: 0.2 10*3/uL (ref 0.0–0.7)
EOS PCT: 2 %
HCT: 39 % (ref 39.0–52.0)
Hemoglobin: 12.8 g/dL — ABNORMAL LOW (ref 13.0–17.0)
Lymphocytes Relative: 23 %
Lymphs Abs: 2.2 10*3/uL (ref 0.7–4.0)
MCH: 27.4 pg (ref 26.0–34.0)
MCHC: 32.8 g/dL (ref 30.0–36.0)
MCV: 83.3 fL (ref 78.0–100.0)
MONO ABS: 1 10*3/uL (ref 0.1–1.0)
MONOS PCT: 10 %
Neutro Abs: 6.2 10*3/uL (ref 1.7–7.7)
Neutrophils Relative %: 64 %
PLATELETS: 224 10*3/uL (ref 150–400)
RBC: 4.68 MIL/uL (ref 4.22–5.81)
RDW: 14.5 % (ref 11.5–15.5)
WBC: 9.7 10*3/uL (ref 4.0–10.5)

## 2017-04-01 LAB — CBC
HCT: 38.6 % — ABNORMAL LOW (ref 39.0–52.0)
HCT: 39.4 % (ref 39.0–52.0)
Hemoglobin: 12.8 g/dL — ABNORMAL LOW (ref 13.0–17.0)
Hemoglobin: 13 g/dL (ref 13.0–17.0)
MCH: 27.5 pg (ref 26.0–34.0)
MCH: 27.3 pg (ref 26.0–34.0)
MCHC: 33.2 g/dL (ref 30.0–36.0)
MCHC: 33 g/dL (ref 30.0–36.0)
MCV: 82.8 fL (ref 78.0–100.0)
MCV: 82.8 fL (ref 78.0–100.0)
PLATELETS: 208 10*3/uL (ref 150–400)
PLATELETS: 201 10*3/uL (ref 150–400)
RBC: 4.66 MIL/uL (ref 4.22–5.81)
RBC: 4.76 MIL/uL (ref 4.22–5.81)
RDW: 14.3 % (ref 11.5–15.5)
RDW: 14.5 % (ref 11.5–15.5)
WBC: 9.6 10*3/uL (ref 4.0–10.5)
WBC: 9.3 10*3/uL (ref 4.0–10.5)

## 2017-04-01 LAB — PROTIME-INR
INR: 1.23
PROTHROMBIN TIME: 15.4 s — AB (ref 11.4–15.2)

## 2017-04-01 LAB — GLUCOSE, CAPILLARY
GLUCOSE-CAPILLARY: 238 mg/dL — AB (ref 65–99)
GLUCOSE-CAPILLARY: 217 mg/dL — AB (ref 65–99)
Glucose-Capillary: 187 mg/dL — ABNORMAL HIGH (ref 65–99)
Glucose-Capillary: 186 mg/dL — ABNORMAL HIGH (ref 65–99)

## 2017-04-01 LAB — TROPONIN I
TROPONIN I: 0.05 ng/mL — AB (ref <0.03)
Troponin I: 0.04 ng/mL (ref <0.03)

## 2017-04-01 LAB — HEPARIN LEVEL (UNFRACTIONATED)
Heparin Unfractionated: 0.45 IU/mL (ref 0.30–0.70)
Heparin Unfractionated: 0.43 IU/mL (ref 0.30–0.70)

## 2017-04-01 MED ORDER — ALPRAZOLAM 0.25 MG PO TABS
0.2500 mg | ORAL_TABLET | Freq: Once | ORAL | Status: AC
  Administered 2017-04-01: 0.25 mg via ORAL
  Filled 2017-04-01: qty 1

## 2017-04-01 NOTE — Progress Notes (Signed)
ANTICOAGULATION CONSULT NOTE  Pharmacy Consult for Heparin Indication: chest pain/ACS  Allergies  Allergen Reactions  . Metformin And Related Other (See Comments)    Only the regular Metformin    Patient Measurements: Height:  (177.8 cm) Weight: 225 lb (102.1 kg) IBW/kg (Calculated) : 73 Heparin Dosing Weight:  94.3 kg  Vital Signs: Temp: 98.1 F (36.7 C) (09/29 2025) Temp Source: Oral (09/29 2025) BP: 115/76 (09/30 0115) Pulse Rate: 118 (09/29 2000)  Labs:  Recent Labs  03/31/17 1615 03/31/17 2130 04/01/17 0028  HGB 13.3  --   --   HCT 40.1  --   --   PLT 215  --   --   APTT 28  --   --   LABPROT 14.7  --   --   INR 1.16  --   --   HEPARINUNFRC  --   --  0.45  CREATININE 0.86  --   --   TROPONINI  --  0.04*  --     Estimated Creatinine Clearance: 103.8 mL/min (by C-G formula based on SCr of 0.86 mg/dL).   Assessment: 66 y.o. male with chest pain for heparin   Goal of Therapy:  Heparin level 0.3-0.7 units/ml Monitor platelets by anticoagulation protocol: Yes   Plan:  Continue Heparin at current rate   Odaly Peri, Gary Fleet 04/01/2017,1:54 AM

## 2017-04-01 NOTE — Progress Notes (Signed)
RN called to room, patient feeling like his heart is racing. No change to HR on monitor, VSS. No chest pain or SOB. States the only thing he's feeling is the racing. MD paged when racing did not subside. Order received for one time dose of Xanax 0.25 mg.

## 2017-04-01 NOTE — Progress Notes (Signed)
Pt c/o 5/10 cp and nausea. EKG obtained and uploaded into chart. Pt bp low 104/72. Cardiology paged. Request for zofran and confirm that nitro ok to give with soft bp. Waiting for call back.

## 2017-04-01 NOTE — Progress Notes (Signed)
Critical Result paged to Cardiology, Troponin 0.04. Patient's chest pain has subsided. Heparin currently running at 13 cc/hr.

## 2017-04-01 NOTE — Plan of Care (Signed)
Problem: Pain Managment: Goal: General experience of comfort will improve Outcome: Progressing Patient states he is pain free. Episode of chest tightness was rated at a 8/10, after 3 nitro SL it came down to a 4/10. After Xanax and Nitro, patient was able to fall asleep. After waking up, patient stated his chest pressure had improved.   Problem: Physical Regulation: Goal: Ability to maintain clinical measurements within normal limits will improve Outcome: Progressing During heart racing episode and chest tightness, vitals remained stable. Patient never had complaints of SOB.   Problem: Tissue Perfusion: Goal: Risk factors for ineffective tissue perfusion will decrease Outcome: Progressing Heparin continues to run at 13 cc/hr

## 2017-04-01 NOTE — Progress Notes (Signed)
Subjective:  No recurrence of chest discomfort overnight.  Troponins minimally elevated.  Objective:  Vital Signs in the last 24 hours: BP 94/63   Pulse (!) 118   Temp 97.8 F (36.6 C) (Oral)   Resp 18   Ht  (1.778 m)   Wt 102.1 kg (225 lb 1.6 oz)   SpO2 96%   BMI 32.30 kg/m   Physical Exam: Anxious male in no acute distress Lungs:  Clear Cardiac:  Rapid,Regular rhythm, normal S1 and S2, no S3 Abdomen:  Soft, nontender, no masses Extremities:  No edema present  Intake/Output from previous day: 09/29 0701 - 09/30 0700 In: 386.3 [P.O.:250; I.V.:136.3] Out: 350 [Urine:350]  Weight Filed Weights   03/31/17 1623 03/31/17 2025 04/01/17 0445  Weight: 102.1 kg (225 lb) 102.1 kg (225 lb) 102.1 kg (225 lb 1.6 oz)    Lab Results: Basic Metabolic Panel:  Recent Labs  16/10/96 1615  NA 136  K 3.5  CL 103  CO2 24  GLUCOSE 289*  BUN 13  CREATININE 0.86   CBC:  Recent Labs  04/01/17 0201 04/01/17 0855  WBC 9.6 9.3  HGB 12.8* 13.0  HCT 38.6* 39.4  MCV 82.8 82.8  PLT 208 201   Cardiac Enzymes: Troponin (Point of Care Test)  Recent Labs  03/31/17 2031  TROPIPOC 0.04   Cardiac Panel (last 3 results)  Recent Labs  03/31/17 2130 04/01/17 0201  TROPONINI 0.04* 0.05*    Telemetry: Sinus rhythm  Assessment/Plan:  1.  Chest tightness consistent with unstable angina pectoris-minimal troponin elevation 2.  CAD with recent PCI of in-stent restenosis of circumflex with residual in-stent restenosis of the diagonal 3.  Previous aortic valve replacement for aortic stenosis 4.  Diabetes mellitus 5.  History of paroxysmal atrial fibrillation currently not anticoagulated because of prior GI bleeding  Recommendations:  Nothing by mouth for possible repeat catheterization tomorrow.  It is possible that the symptoms he had could have come from the in-stent restenosis in the diagonal branch also.  He is currently tachycardic and will recheck CBC in light of  prior GI bleeding.  Symptoms were somewhat different in nature from the symptoms that he presented with in August but still relieved with nitroglycerin.     Darden Palmer  MD [facility]  04/01/2017, 10:03 AM

## 2017-04-02 ENCOUNTER — Inpatient Hospital Stay (HOSPITAL_COMMUNITY): Payer: BLUE CROSS/BLUE SHIELD

## 2017-04-02 ENCOUNTER — Inpatient Hospital Stay (HOSPITAL_COMMUNITY)
Admission: EM | Disposition: A | Discharge status: Home / Self Care | Payer: Self-pay | Source: Home / Self Care | Attending: Cardiology

## 2017-04-02 DIAGNOSIS — I25118 Atherosclerotic heart disease of native coronary artery with other forms of angina pectoris: Secondary | ICD-10-CM

## 2017-04-02 DIAGNOSIS — Z952 Presence of prosthetic heart valve: Secondary | ICD-10-CM

## 2017-04-02 DIAGNOSIS — E1159 Type 2 diabetes mellitus with other circulatory complications: Secondary | ICD-10-CM

## 2017-04-02 DIAGNOSIS — R072 Precordial pain: Secondary | ICD-10-CM

## 2017-04-02 HISTORY — PX: CORONARY BALLOON ANGIOPLASTY: CATH118233

## 2017-04-02 HISTORY — PX: CORONARY/GRAFT ANGIOGRAPHY: CATH118237

## 2017-04-02 LAB — HEPARIN LEVEL (UNFRACTIONATED): Heparin Unfractionated: 0.59 IU/mL (ref 0.30–0.70)

## 2017-04-02 LAB — CBC
HCT: 38.7 % — ABNORMAL LOW (ref 39.0–52.0)
HEMOGLOBIN: 12.8 g/dL — AB (ref 13.0–17.0)
MCH: 27.5 pg (ref 26.0–34.0)
MCHC: 33.1 g/dL (ref 30.0–36.0)
MCV: 83.2 fL (ref 78.0–100.0)
Platelets: 215 10*3/uL (ref 150–400)
RBC: 4.65 MIL/uL (ref 4.22–5.81)
RDW: 14.7 % (ref 11.5–15.5)
WBC: 10.3 10*3/uL (ref 4.0–10.5)

## 2017-04-02 LAB — BASIC METABOLIC PANEL
ANION GAP: 8 (ref 5–15)
BUN: 11 mg/dL (ref 6–20)
CALCIUM: 9.1 mg/dL (ref 8.9–10.3)
CHLORIDE: 101 mmol/L (ref 101–111)
CO2: 28 mmol/L (ref 22–32)
Creatinine, Ser: 0.67 mg/dL (ref 0.61–1.24)
GFR calc Af Amer: >60 mL/min (ref >60)
GFR calc non Af Amer: >60 mL/min (ref >60)
GLUCOSE: 155 mg/dL — AB (ref 65–99)
Potassium: 3.6 mmol/L (ref 3.5–5.1)
Sodium: 137 mmol/L (ref 135–145)

## 2017-04-02 LAB — GLUCOSE, CAPILLARY
GLUCOSE-CAPILLARY: 189 mg/dL — AB (ref 65–99)
Glucose-Capillary: 145 mg/dL — ABNORMAL HIGH (ref 65–99)
Glucose-Capillary: 128 mg/dL — ABNORMAL HIGH (ref 65–99)
Glucose-Capillary: 108 mg/dL — ABNORMAL HIGH (ref 65–99)

## 2017-04-02 LAB — ECHOCARDIOGRAM COMPLETE
HEIGHTINCHES: 70 in
WEIGHTICAEL: 3603.2 [oz_av]

## 2017-04-02 LAB — POCT ACTIVATED CLOTTING TIME: Activated Clotting Time: 511 seconds

## 2017-04-02 LAB — TROPONIN I: Troponin I: 0.05 ng/mL (ref <0.03)

## 2017-04-02 SURGERY — CORONARY/GRAFT ANGIOGRAPHY
Anesthesia: LOCAL

## 2017-04-02 MED ORDER — ASPIRIN 81 MG PO CHEW
81.0000 mg | CHEWABLE_TABLET | Freq: Every day | ORAL | Status: DC
  Administered 2017-04-03: 81 mg via ORAL
  Filled 2017-04-02: qty 1

## 2017-04-02 MED ORDER — SODIUM CHLORIDE 0.9 % IV SOLN: 250.0000 mL | INTRAVENOUS | Status: DC | PRN

## 2017-04-02 MED ORDER — IOPAMIDOL (ISOVUE-370) INJECTION 76%
INTRAVENOUS | Status: AC
  Filled 2017-04-02: qty 100

## 2017-04-02 MED ORDER — ATORVASTATIN CALCIUM 80 MG PO TABS: 80.0000 mg | ORAL_TABLET | Freq: Every day | ORAL | Status: DC

## 2017-04-02 MED ORDER — MIDAZOLAM HCL 2 MG/2ML IJ SOLN
INTRAMUSCULAR | Status: AC
  Filled 2017-04-02: qty 2

## 2017-04-02 MED ORDER — ANGIOPLASTY BOOK
Freq: Once | Status: AC
  Administered 2017-04-02: 20:00:00
  Filled 2017-04-02: qty 1

## 2017-04-02 MED ORDER — DIAZEPAM 5 MG PO TABS: 5.0000 mg | ORAL_TABLET | ORAL | Status: DC | PRN

## 2017-04-02 MED ORDER — NITROGLYCERIN 1 MG/10 ML FOR IR/CATH LAB
INTRA_ARTERIAL | Status: DC | PRN
  Administered 2017-04-02: 200 ug via INTRACORONARY
  Administered 2017-04-02 (×3): 100 ug via INTRACORONARY

## 2017-04-02 MED ORDER — LABETALOL HCL 5 MG/ML IV SOLN: 10.0000 mg | INTRAVENOUS | Status: DC | PRN

## 2017-04-02 MED ORDER — SODIUM CHLORIDE 0.9 % IV SOLN
INTRAVENOUS | Status: DC | PRN
  Administered 2017-04-02 (×2): 1.75 mg/kg/h via INTRAVENOUS

## 2017-04-02 MED ORDER — CLOPIDOGREL BISULFATE 75 MG PO TABS
ORAL_TABLET | ORAL | Status: AC
  Filled 2017-04-02: qty 2

## 2017-04-02 MED ORDER — ASPIRIN 81 MG PO CHEW: 81.0000 mg | CHEWABLE_TABLET | ORAL | Status: AC

## 2017-04-02 MED ORDER — MIDAZOLAM HCL 2 MG/2ML IJ SOLN
INTRAMUSCULAR | Status: DC | PRN
  Administered 2017-04-02 (×2): 1 mg via INTRAVENOUS

## 2017-04-02 MED ORDER — NITROGLYCERIN 1 MG/10 ML FOR IR/CATH LAB
INTRA_ARTERIAL | Status: AC
  Filled 2017-04-02: qty 10

## 2017-04-02 MED ORDER — FENTANYL CITRATE (PF) 100 MCG/2ML IJ SOLN
INTRAMUSCULAR | Status: DC | PRN
  Administered 2017-04-02: 25 ug via INTRAVENOUS
  Administered 2017-04-02: 50 ug via INTRAVENOUS
  Administered 2017-04-02: 25 ug via INTRAVENOUS

## 2017-04-02 MED ORDER — LIDOCAINE HCL (PF) 1 % IJ SOLN
INTRAMUSCULAR | Status: DC | PRN
  Administered 2017-04-02: 15 mL

## 2017-04-02 MED ORDER — SODIUM CHLORIDE 0.9 % WEIGHT BASED INFUSION: 1.0000 mL/kg/h | INTRAVENOUS | Status: DC

## 2017-04-02 MED ORDER — CLOPIDOGREL BISULFATE 300 MG PO TABS
ORAL_TABLET | ORAL | Status: DC | PRN
  Administered 2017-04-02: 150 mg via ORAL

## 2017-04-02 MED ORDER — SODIUM CHLORIDE 0.9 % IV SOLN: INTRAVENOUS | Status: DC

## 2017-04-02 MED ORDER — SODIUM CHLORIDE 0.9 % WEIGHT BASED INFUSION: 3.0000 mL/kg/h | INTRAVENOUS | Status: DC

## 2017-04-02 MED ORDER — ACETAMINOPHEN 325 MG PO TABS: 650.0000 mg | ORAL_TABLET | ORAL | Status: DC | PRN

## 2017-04-02 MED ORDER — SODIUM CHLORIDE 0.9% FLUSH
3.0000 mL | Freq: Two times a day (BID) | INTRAVENOUS | Status: DC
  Administered 2017-04-03: 3 mL via INTRAVENOUS

## 2017-04-02 MED ORDER — HEPARIN (PORCINE) IN NACL 2-0.9 UNIT/ML-% IJ SOLN
INTRAMUSCULAR | Status: AC | PRN
  Administered 2017-04-02: 1000 mL

## 2017-04-02 MED ORDER — ZOLPIDEM TARTRATE 5 MG PO TABS: 5.0000 mg | ORAL_TABLET | Freq: Every evening | ORAL | Status: DC | PRN

## 2017-04-02 MED ORDER — FENTANYL CITRATE (PF) 100 MCG/2ML IJ SOLN
INTRAMUSCULAR | Status: AC
  Filled 2017-04-02: qty 2

## 2017-04-02 MED ORDER — PERFLUTREN LIPID MICROSPHERE
1.0000 mL | INTRAVENOUS | Status: AC | PRN
  Administered 2017-04-02: 2 mL via INTRAVENOUS
  Filled 2017-04-02: qty 10

## 2017-04-02 MED ORDER — BIVALIRUDIN TRIFLUOROACETATE 250 MG IV SOLR
INTRAVENOUS | Status: AC
  Filled 2017-04-02: qty 250

## 2017-04-02 MED ORDER — SODIUM CHLORIDE 0.9% FLUSH: 3.0000 mL | INTRAVENOUS | Status: DC | PRN

## 2017-04-02 MED ORDER — SODIUM CHLORIDE 0.9% FLUSH: 3.0000 mL | Freq: Two times a day (BID) | INTRAVENOUS | Status: DC

## 2017-04-02 MED ORDER — CLOPIDOGREL BISULFATE 75 MG PO TABS
75.0000 mg | ORAL_TABLET | Freq: Every day | ORAL | Status: DC
  Administered 2017-04-03: 75 mg via ORAL
  Filled 2017-04-02: qty 1

## 2017-04-02 MED ORDER — ATROPINE SULFATE 1 MG/10ML IJ SOSY
PREFILLED_SYRINGE | INTRAMUSCULAR | Status: AC
  Filled 2017-04-02: qty 10

## 2017-04-02 MED ORDER — ONDANSETRON HCL 4 MG/2ML IJ SOLN: 4.0000 mg | Freq: Four times a day (QID) | INTRAMUSCULAR | Status: DC | PRN

## 2017-04-02 MED ORDER — ONDANSETRON HCL 4 MG/2ML IJ SOLN
4.0000 mg | Freq: Once | INTRAMUSCULAR | Status: DC
  Filled 2017-04-02: qty 2

## 2017-04-02 MED ORDER — MORPHINE SULFATE (PF) 2 MG/ML IV SOLN
2.0000 mg | Freq: Once | INTRAVENOUS | Status: DC
  Filled 2017-04-02: qty 1

## 2017-04-02 MED ORDER — IOPAMIDOL (ISOVUE-370) INJECTION 76%
INTRAVENOUS | Status: AC
  Filled 2017-04-02: qty 50

## 2017-04-02 MED ORDER — LIDOCAINE HCL 2 % IJ SOLN
INTRAMUSCULAR | Status: AC
  Filled 2017-04-02: qty 10

## 2017-04-02 MED ORDER — BIVALIRUDIN BOLUS VIA INFUSION - CUPID
INTRAVENOUS | Status: DC | PRN
  Administered 2017-04-02: 76.65 mg via INTRAVENOUS

## 2017-04-02 MED ORDER — IOPAMIDOL (ISOVUE-370) INJECTION 76%
INTRAVENOUS | Status: DC | PRN
  Administered 2017-04-02: 215 mL via INTRA_ARTERIAL

## 2017-04-02 MED ORDER — HEPARIN (PORCINE) IN NACL 2-0.9 UNIT/ML-% IJ SOLN
INTRAMUSCULAR | Status: AC
  Filled 2017-04-02: qty 1000

## 2017-04-02 MED ORDER — HYDRALAZINE HCL 20 MG/ML IJ SOLN: 5.0000 mg | INTRAMUSCULAR | Status: DC | PRN

## 2017-04-02 SURGICAL SUPPLY — 22 items
BALLN EUPHORA RX 2.0X12 (BALLOONS) ×2
BALLN WOLVERINE 2.00X10 (BALLOONS) ×2
BALLN WOLVERINE 2.50X10 (BALLOONS) ×2
BALLN WOLVERINE 3.00X10 (BALLOONS) ×2
BALLN ~~LOC~~ EUPHORA RX 2.5X12 (BALLOONS) ×2
BALLOON EUPHORA RX 2.0X12 (BALLOONS) ×1 IMPLANT
BALLOON WOLVERINE 2.00X10 (BALLOONS) ×1 IMPLANT
BALLOON WOLVERINE 2.50X10 (BALLOONS) ×1 IMPLANT
BALLOON WOLVERINE 3.00X10 (BALLOONS) ×1 IMPLANT
BALLOON ~~LOC~~ EUPHORA RX 2.5X12 (BALLOONS) ×1 IMPLANT
CATH EXPO 5FR FR4 (CATHETERS) ×2 IMPLANT
CATH INFINITI 5FR JL4 (CATHETERS) ×2 IMPLANT
CATH VISTA GUIDE 6FR XB3.5 (CATHETERS) ×2 IMPLANT
KIT ENCORE 26 ADVANTAGE (KITS) ×2 IMPLANT
KIT ESSENTIALS PG (KITS) ×2 IMPLANT
KIT HEART LEFT (KITS) ×2 IMPLANT
PACK CARDIAC CATHETERIZATION (CUSTOM PROCEDURE TRAY) ×2 IMPLANT
SHEATH PINNACLE 5F 10CM (SHEATH) ×2 IMPLANT
SHEATH PINNACLE 6F 10CM (SHEATH) ×2 IMPLANT
TRANSDUCER W/STOPCOCK (MISCELLANEOUS) ×2 IMPLANT
TUBING CIL FLEX 10 FLL-RA (TUBING) ×2 IMPLANT
WIRE ASAHI PROWATER 180CM (WIRE) ×2 IMPLANT

## 2017-04-02 NOTE — Care Management Note (Signed)
Case Management Note  Patient Details  Name: Christopher Pineo Sr. MRN: [id number] Date of Birth: [birth year]  Subjective/Objective: Pt presented for Chest Pain- Plan for cardiac cath 04-02-17. PTA Independent from home with sister and brother n law. Pt states he has [employer]BS and is able to get his medications without any problems at the [facility] in [place].                    Action/Plan: CM did speak with the patient in regards to DME and he is thinking he may benefit from [facility]. CM will continue to monitor for disposition needs.   Expected Discharge Date:                  Expected Discharge Plan:  Home/Self Care  In-House Referral:  NA  Discharge planning Services  CM Consult  Post Acute Care Choice:    Choice offered to:     DME Arranged:    DME Agency:  NA  HH Arranged:  NA HH Agency:     Status of Service:  In process, will continue to follow  If discussed at Long Length of Stay Meetings, dates discussed:    Additional Comments:  Gala Lewandowsky, RN 04/02/2017, 11:46 AM

## 2017-04-02 NOTE — Progress Notes (Signed)
  Echocardiogram 2D Echocardiogram has been performed.  Christopher Cummings 04/02/2017, 2:13 PM

## 2017-04-02 NOTE — Interval H&P Note (Signed)
Cath Lab Visit (complete for each Cath Lab visit)  Clinical Evaluation Leading to the Procedure:   ACS: No.  Non-ACS:    Anginal Classification: CCS III  Anti-ischemic medical therapy: Maximal Therapy (2 or more classes of medications)  Non-Invasive Test Results: No non-invasive testing performed  Prior CABG: Previous CABG      History and Physical Interval Note:  04/02/2017 5:06 PM  Christopher Else Sr.  has presented today for surgery, with the diagnosis of unstable angina  The various methods of treatment have been discussed with the patient and family. After consideration of risks, benefits and other options for treatment, the patient has consented to  Procedure(s): CORONARY/GRAFT ANGIOGRAPHY (N/A) as a surgical intervention .  The patient's history has been reviewed, patient examined, no change in status, stable for surgery.  I have reviewed the patient's chart and labs.  Questions were answered to the patient's satisfaction.     Nicki Guadalajara

## 2017-04-02 NOTE — Progress Notes (Signed)
Site area: right groin  Site Prior to Removal:  Level 0  Pressure Applied For 15 MINUTES    Minutes Beginning at 21:45  Manual:   Yes.    Patient Status During Pull:  WNL  Post Pull Groin Site:  Level 0  Post Pull Instructions Given:  Yes.    Post Pull Pulses Present:  Yes.    Dressing Applied:  Yes.    Comments:  Pt tolerated well.V/S stable

## 2017-04-02 NOTE — Plan of Care (Signed)
Problem: Pain Managment: Goal: General experience of comfort will improve Outcome: Progressing Pt states he slept very well and does not currently have any complaints of pain or discomfort  Problem: Physical Regulation: Goal: Ability to maintain clinical measurements within normal limits will improve Outcome: Progressing PT heartrate has improved from tachycardic at the beginning of the shift to a normal rate

## 2017-04-02 NOTE — Progress Notes (Signed)
ANTICOAGULATION CONSULT NOTE  Pharmacy Consult for Heparin Indication: chest pain/ACS  Allergies  Allergen Reactions  . Metformin And Related Other (See Comments)    Only the regular Metformin    Patient Measurements: Height:  (177.8 cm) Weight: 225 lb 3.2 oz (102.2 kg) IBW/kg (Calculated) : 73 Heparin Dosing Weight:  94.3 kg  Vital Signs: Temp: 97.7 F (36.5 C) (10/01 0452) Temp Source: Oral (10/01 0452) BP: 118/76 (10/01 0452) Pulse Rate: 108 (09/30 2049)  Labs:  Recent Labs  03/31/17 1615 03/31/17 2130 04/01/17 0028 04/01/17 0201 04/01/17 0855 04/01/17 1022 04/02/17 0355  HGB 13.3  --   --  12.8* 13.0 12.8* 12.8*  HCT 40.1  --   --  38.6* 39.4 39.0 38.7*  PLT 215  --   --  208 201 224 215  APTT 28  --   --   --   --   --   --   LABPROT 14.7  --   --   --  15.4*  --   --   INR 1.16  --   --   --  1.23  --   --   HEPARINUNFRC  --   --  0.45 0.43  --   --  0.59  CREATININE 0.86  --   --   --  0.68  --   --   TROPONINI  --  0.04*  --  0.05* 0.04*  --   --     Estimated Creatinine Clearance: 111.8 mL/min (by C-G formula based on SCr of 0.68 mg/dL).   Assessment: 66 y.o. male with chest pain for heparin  Heparin level therapeutic  Goal of Therapy:  Heparin level 0.3-0.7 units/ml Monitor platelets by anticoagulation protocol: Yes   Plan:  Continue heparin at 1300 units / hr Follow up after cath  Thank you Okey Regal, PharmD [phone]  04/02/2017,8:27 AM

## 2017-04-02 NOTE — H&P (View-Only) (Signed)
Progress Note  Patient Name: Christopher Waibel Sr. Date of Encounter: 04/02/2017  Primary Cardiologist: Dr. Kirke Corin  Subjective   No chest pain  Inpatient Medications    Scheduled Meds: . aspirin EC  162 mg Oral Daily  . atorvastatin  80 mg Oral QHS  . clopidogrel  75 mg Oral Daily  . fluticasone  1 spray Each Nare Daily  . insulin aspart  0-5 Units Subcutaneous QHS  . insulin aspart  0-9 Units Subcutaneous TID WC  . isosorbide mononitrate  30 mg Oral Daily  . lisinopril  10 mg Oral QHS  . metoprolol succinate  50 mg Oral Daily  .  morphine injection  2 mg Intravenous Once  . ondansetron (ZOFRAN) IV  4 mg Intravenous Once  . pantoprazole  40 mg Oral Daily   Continuous Infusions: . heparin 1,300 Units/hr (04/02/17 0847)   PRN Meds: meclizine, nitroGLYCERIN   Vital Signs    Vitals:   04/01/17 2049 04/01/17 2108 04/01/17 2250 04/02/17 0452  BP: 93/77 125/81 112/72 118/76  Pulse: (!) 108     Resp:      Temp: 98.2 F (36.8 C)   97.7 F (36.5 C)  TempSrc:    Oral  SpO2: 98% 99%  98%  Weight:    225 lb 3.2 oz (102.2 kg)  Height:        Intake/Output Summary (Last 24 hours) at 04/02/17 1020 Last data filed at 04/02/17 0807  Gross per 24 hour  Intake              685 ml  Output              650 ml  Net               35 ml   Filed Weights   03/31/17 2025 04/01/17 0445 04/02/17 0452  Weight: 225 lb (102.1 kg) 225 lb 1.6 oz (102.1 kg) 225 lb 3.2 oz (102.2 kg)    Telemetry    A Tach vs. Sinus tach - Personally Reviewed  ECG    A Tach, with block - Personally Reviewed  Physical Exam   GEN: No acute distress.   Neck: No JVD Cardiac: RRR, no murmurs, rubs, or gallops.  Respiratory: Clear to auscultation bilaterally. GI: Soft, nontender, non-distended  MS: No edema; No deformity. Neuro:  Nonfocal  Psych: Normal affect   Labs    Chemistry Recent Labs Lab 03/31/17 1615 04/01/17 0855  NA 136 136  K 3.5 3.4*  CL 103 102  CO2 24 24  GLUCOSE 289*  205*  BUN 13 11  CREATININE 0.86 0.68  CALCIUM 9.2 8.8*  PROT  --  5.9*  ALBUMIN  --  3.2*  AST  --  21  ALT  --  19  ALKPHOS  --  68  BILITOT  --  1.0  GFRNONAA >60 >60  GFRAA >60 >60  ANIONGAP 9 10     Hematology Recent Labs Lab 04/01/17 0855 04/01/17 1022 04/02/17 0355  WBC 9.3 9.7 10.3  RBC 4.76 4.68 4.65  HGB 13.0 12.8* 12.8*  HCT 39.4 39.0 38.7*  MCV 82.8 83.3 83.2  MCH 27.3 27.4 27.5  MCHC 33.0 32.8 33.1  RDW 14.5 14.5 14.7  PLT 201 224 215    Cardiac Enzymes Recent Labs Lab 03/31/17 2130 04/01/17 0201 04/01/17 0855  TROPONINI 0.04* 0.05* 0.04*    Recent Labs Lab 03/31/17 1628 03/31/17 2031  TROPIPOC 0.02 0.04     BNP  Recent Labs Lab 03/31/17 2130  BNP 171.0*     DDimer No results for input(s): DDIMER in the last 168 hours.   Radiology    Dg Chest Portable 1 View  Result Date: 03/31/2017 CLINICAL DATA:  Chest pain EXAM: PORTABLE CHEST 1 VIEW COMPARISON:  02/24/2017. FINDINGS: Previous median sternotomy and CABG procedure. Mild cardiac enlargement. Both lungs are clear. The visualized skeletal structures are unremarkable. IMPRESSION: No active disease. Electronically Signed   By: Taylor  Stroud M.D.   On: 03/31/2017 17:35    Cardiac Studies   Cath results reviewed  Patient Profile     66 y.o. male with CAD, AVR, PAF  Assessment & Plan    1) [place]: Awaiting cath today.  Consider Synergy stent given bleeding issues, in the event that antiplatelet therapy has to be shortened.  2) PAF: Not on Eliquis anymore due to GI bleed in the past.  Decrease aspirin to 81 mg daily along with Plavix for DAPT.  3) h/o AVR. Continue SBE prophylaxis.  DM2: Continue lifestyle changes to help control sugar.   Hyperlipidemia: COntrolled at [facility] in 8/18.  For questions or updates, please contact [facility] Please consult [email or web address] for contact info under Cardiology/STEMI.      Signed, Thaine Garriga, MD  04/02/2017, 10:20 AM    

## 2017-04-02 NOTE — Progress Notes (Addendum)
Progress Note  Patient Name: Christopher Waibel Sr. Date of Encounter: 04/02/2017  Primary Cardiologist: Dr. Kirke Corin  Subjective   No chest pain  Inpatient Medications    Scheduled Meds: . aspirin EC  162 mg Oral Daily  . atorvastatin  80 mg Oral QHS  . clopidogrel  75 mg Oral Daily  . fluticasone  1 spray Each Nare Daily  . insulin aspart  0-5 Units Subcutaneous QHS  . insulin aspart  0-9 Units Subcutaneous TID WC  . isosorbide mononitrate  30 mg Oral Daily  . lisinopril  10 mg Oral QHS  . metoprolol succinate  50 mg Oral Daily  .  morphine injection  2 mg Intravenous Once  . ondansetron (ZOFRAN) IV  4 mg Intravenous Once  . pantoprazole  40 mg Oral Daily   Continuous Infusions: . heparin 1,300 Units/hr (04/02/17 0847)   PRN Meds: meclizine, nitroGLYCERIN   Vital Signs    Vitals:   04/01/17 2049 04/01/17 2108 04/01/17 2250 04/02/17 0452  BP: 93/77 125/81 112/72 118/76  Pulse: (!) 108     Resp:      Temp: 98.2 F (36.8 C)   97.7 F (36.5 C)  TempSrc:    Oral  SpO2: 98% 99%  98%  Weight:    225 lb 3.2 oz (102.2 kg)  Height:        Intake/Output Summary (Last 24 hours) at 04/02/17 1020 Last data filed at 04/02/17 0807  Gross per 24 hour  Intake              685 ml  Output              650 ml  Net               35 ml   Filed Weights   03/31/17 2025 04/01/17 0445 04/02/17 0452  Weight: 225 lb (102.1 kg) 225 lb 1.6 oz (102.1 kg) 225 lb 3.2 oz (102.2 kg)    Telemetry    A Tach vs. Sinus tach - Personally Reviewed  ECG    A Tach, with block - Personally Reviewed  Physical Exam   GEN: No acute distress.   Neck: No JVD Cardiac: RRR, no murmurs, rubs, or gallops.  Respiratory: Clear to auscultation bilaterally. GI: Soft, nontender, non-distended  MS: No edema; No deformity. Neuro:  Nonfocal  Psych: Normal affect   Labs    Chemistry Recent Labs Lab 03/31/17 1615 04/01/17 0855  NA 136 136  K 3.5 3.4*  CL 103 102  CO2 24 24  GLUCOSE 289*  205*  BUN 13 11  CREATININE 0.86 0.68  CALCIUM 9.2 8.8*  PROT  --  5.9*  ALBUMIN  --  3.2*  AST  --  21  ALT  --  19  ALKPHOS  --  68  BILITOT  --  1.0  GFRNONAA >60 >60  GFRAA >60 >60  ANIONGAP 9 10     Hematology Recent Labs Lab 04/01/17 0855 04/01/17 1022 04/02/17 0355  WBC 9.3 9.7 10.3  RBC 4.76 4.68 4.65  HGB 13.0 12.8* 12.8*  HCT 39.4 39.0 38.7*  MCV 82.8 83.3 83.2  MCH 27.3 27.4 27.5  MCHC 33.0 32.8 33.1  RDW 14.5 14.5 14.7  PLT 201 224 215    Cardiac Enzymes Recent Labs Lab 03/31/17 2130 04/01/17 0201 04/01/17 0855  TROPONINI 0.04* 0.05* 0.04*    Recent Labs Lab 03/31/17 1628 03/31/17 2031  TROPIPOC 0.02 0.04     BNP  Recent Labs Lab 03/31/17 2130  BNP 171.0*     DDimer No results for input(s): DDIMER in the last 168 hours.   Radiology    Dg Chest Portable 1 View  Result Date: 03/31/2017 CLINICAL DATA:  Chest pain EXAM: PORTABLE CHEST 1 VIEW COMPARISON:  02/24/2017. FINDINGS: Previous median sternotomy and CABG procedure. Mild cardiac enlargement. Both lungs are clear. The visualized skeletal structures are unremarkable. IMPRESSION: No active disease. Electronically Signed   By: Signa Kell M.D.   On: 03/31/2017 17:35    Cardiac Studies   Cath results reviewed  Patient Profile     66 y.o. male with CAD, AVR, PAF  Assessment & Plan    1) [place]: Awaiting cath today.  Consider Synergy stent given bleeding issues, in the event that antiplatelet therapy has to be shortened.  2) PAF: Not on Eliquis anymore due to GI bleed in the past.  Decrease aspirin to 81 mg daily along with Plavix for DAPT.  3) h/o AVR. Continue SBE prophylaxis.  DM2: Continue lifestyle changes to help control sugar.   Hyperlipidemia: COntrolled at [facility] in 8/18.  For questions or updates, please contact [facility] Please consult [email or web address] for contact info under Cardiology/STEMI.      Signed, Lance Muss, MD  04/02/2017, 10:20 AM

## 2017-04-03 ENCOUNTER — Telehealth: Payer: Self-pay | Admitting: Cardiovascular Disease

## 2017-04-03 ENCOUNTER — Encounter (HOSPITAL_COMMUNITY): Payer: Self-pay | Admitting: Cardiovascular Disease

## 2017-04-03 DIAGNOSIS — I471 Supraventricular tachycardia: Secondary | ICD-10-CM

## 2017-04-03 DIAGNOSIS — I5021 Acute systolic (congestive) heart failure: Secondary | ICD-10-CM

## 2017-04-03 DIAGNOSIS — I480 Paroxysmal atrial fibrillation: Secondary | ICD-10-CM

## 2017-04-03 LAB — CBC
HEMATOCRIT: 36.7 % — AB (ref 39.0–52.0)
Hemoglobin: 12.1 g/dL — ABNORMAL LOW (ref 13.0–17.0)
MCH: 27.4 pg (ref 26.0–34.0)
MCHC: 33 g/dL (ref 30.0–36.0)
MCV: 83.2 fL (ref 78.0–100.0)
Platelets: 198 10*3/uL (ref 150–400)
RBC: 4.41 MIL/uL (ref 4.22–5.81)
RDW: 15 % (ref 11.5–15.5)
WBC: 8.6 10*3/uL (ref 4.0–10.5)

## 2017-04-03 LAB — BASIC METABOLIC PANEL
ANION GAP: 6 (ref 5–15)
BUN: 11 mg/dL (ref 6–20)
CALCIUM: 8.7 mg/dL — AB (ref 8.9–10.3)
CO2: 27 mmol/L (ref 22–32)
Chloride: 104 mmol/L (ref 101–111)
Creatinine, Ser: 0.85 mg/dL (ref 0.61–1.24)
GLUCOSE: 169 mg/dL — AB (ref 65–99)
Potassium: 3.6 mmol/L (ref 3.5–5.1)
Sodium: 137 mmol/L (ref 135–145)

## 2017-04-03 LAB — GLUCOSE, CAPILLARY
Glucose-Capillary: 142 mg/dL — ABNORMAL HIGH (ref 65–99)
Glucose-Capillary: 207 mg/dL — ABNORMAL HIGH (ref 65–99)

## 2017-04-03 MED ORDER — ISOSORBIDE MONONITRATE ER 30 MG PO TB24: 30.0000 mg | ORAL_TABLET | Freq: Every day | ORAL | 2 refills | Status: DC

## 2017-04-03 MED ORDER — METOPROLOL SUCCINATE ER 50 MG PO TB24: 50.0000 mg | ORAL_TABLET | Freq: Every day | ORAL | 1 refills | Status: DC

## 2017-04-03 MED FILL — Lidocaine HCl Local Inj 2%: INTRAMUSCULAR | Qty: 10 | Status: AC

## 2017-04-03 MED FILL — Clopidogrel Bisulfate Tab 75 MG (Base Equiv): ORAL | Qty: 2 | Status: AC

## 2017-04-03 NOTE — Progress Notes (Signed)
CARDIAC REHAB PHASE I   PRE:  Rate/Rhythm: 103 afib    BP: sitting 136/80    SaO2:   MODE:  Ambulation: 750 ft   POST:  Rate/Rhythm: 118 ST    BP: sitting 129/75     SaO2:   Tolerated well, feels good. It looks like pt changes from Afib to ST. Sts he feels great and loves to walk.  Ed completed. Pt struggles to keep all of his health information straight therefore it would be good to reinforce with his sister. He understands importance of Plavix/ASA and is in the process of starting CRPII. Will refer to [place] (to update them). Gave pt Afib book. He understands NTG and walking guidelines.  [id number]  Harriet Masson CES, ACSM 04/03/2017 9:18 AM

## 2017-04-03 NOTE — Telephone Encounter (Signed)
Appointment rescheduled to f/u with Dr Kirke Corin on 04/16/17 at 2:40 pm in [place]. Patient is currently admitted. Patient will need to be notified at the time of TCM call of the change of appointment.

## 2017-04-03 NOTE — Care Management Note (Signed)
Case Management Note  Patient Details  Name: Christopher Cummings. MRN: [id number] Date of Birth: [birth year]  Subjective/Objective:  From home with sister and brother n law, pta indep, s/p coronary angioplasty, will be on plavix.    PCP is Dr. Lonell Grandchild from [facility] in [place].                 Action/Plan: NCM will follow for dc need.    Expected Discharge Date:                  Expected Discharge Plan:  Home/Self Care  In-House Referral:  NA  Discharge planning Services  CM Consult  Post Acute Care Choice:    Choice offered to:     DME Arranged:    DME Agency:  NA  HH Arranged:  NA HH Agency:     Status of Service:  Completed, signed off  If discussed at Long Length of Stay Meetings, dates discussed:    Additional Comments:  Leone Haven, RN 04/03/2017, 10:40 AM

## 2017-04-03 NOTE — Telephone Encounter (Signed)
tcm ph [facility] for Angioplasty  Scheduled 11/13 with Ryan  At 2  Added to the waitlist  Needs 7-10 day fu

## 2017-04-03 NOTE — Discharge Summary (Signed)
Discharge Summary    Patient ID: Christopher Cummings.,  MRN: [id number], DOB/AGE: 64-Sep-1952 64 y.o.  Admit date: 03/31/2017 Discharge date: 04/03/2017  Primary Care Provider: System, Pcp Not In Primary Cardiologist: Arida   Discharge Diagnoses    Principal Problem:   Unstable angina [facility]) Active Problems:   HTN (hypertension)   Diabetes (HCC)   CAD (coronary artery disease)   H/O prosthetic aortic valve replacement   Acute systolic heart failure (HCC)   Allergies Allergies  Allergen Reactions  . Metformin And Related Other (See Comments)    Only the regular Metformin    Diagnostic Studies/Procedures    Cath: 04/02/17  Conclusion     Mid LAD lesion, 70 %stenosed.  1st Diag lesion, 70 %stenosed.  Ost 3rd Mrg lesion, 90 %stenosed.  LIMA and is anatomically normal.  Prox RCA lesion, 10 %stenosed.  Mid RCA lesion, 30 %stenosed.  Dist LAD lesion, 80 %stenosed.  Dist Cx lesion, 70 %stenosed.  Post intervention, there is a 10% residual stenosis.  A stent was successfully placed.  Ost Cx to Mid Cx lesion, 90 %stenosed.  Post intervention, there is a 10% residual stenosis.  Ost 1st Diag to 1st Diag lesion, 95 %stenosed.  Post intervention, there is a 50% residual stenosis.   Multi -vessel  native CAD with previously noted 95% in-stent restenosis in a stent at the ostium of the diagonal vessel, 70% LAD stenoses prior to the LIMA insertion, 90% in-stent restenosis in the proximal circumflex stent with 70% in-stent restenosis in the distal circumflex stent and 90% stenosis in a jailed distal branch arising from the distal stented segment; and patent proximal RCA stent with minimal intimal hyperplasia with 20-30% distal RCA stenoses.  Patent  LIMA graft supplying the mid LAD; however, the distal LAD is diffusely diseased with narrowing ofup to 80% and small caliber.  Successful cutting balloon intervention with a 2.510 and 3.010 mm will bring balloon in  the proximal circumflex stent in the 90% stenosis being reduced to 10%, and a 2.510 mm will bring balloon in the distal circumflex stent with the 70% stenosis.  Reduced to 15% without change in the small branch ostial stenosis arising from the distal stent.  Difficult PCI to the diffuse chronic in-stent stenosis of the diagonal vessel, which ultimately required a 2.012 mm balloon, 2.010 mm cutting balloon, 2.510 mm cutting balloon, and 2.512 mm noncompliant balloon with the diffuse 95% stenosis being reduced to ~50%.  Well-seated TAVR bioprosthetic valve.  RECOMMENDATION: Dual antiplatelet therapy indefinitely.  Medical therapy for concomitant CAD with nitrates, beta blocker therapy, possible Ran[place] with diffuse disease.   Study Conclusions  - Left ventricle: The cavity size was normal. Systolic function was   moderately reduced. The estimated ejection fraction was in the   range of 35% to 40%. Diffuse hypokinesis. The study was not   technically sufficient to allow evaluation of LV diastolic   dysfunction due to atrial fibrillation. - Aortic valve: A bioprosthesis sits well in the aortic position.   Mobility was not restricted. There was no regurgitation. Mean   gradient (S): 14 mm Hg. Peak gradient (S): 23 mm Hg. Valve area   (VTI): 1.17 cm^2. Valve area (Vmax): 1.27 cm^2. Valve area   (Vmean): 1.17 cm^2. - Mitral valve: There was mild regurgitation. - Right ventricle: The cavity size was mildly dilated. Wall   thickness was normal. Systolic function was mildly reduced. - Tricuspid valve: There was trivial regurgitation. - Pulmonary arteries: Systolic pressure  was within the normal   range.  Impressions:  - When compared to the prior study from 02/25/2017 LVEF has   decreased from 50-55% to 35-40%.   Gradients across the bioprosthetic valve are normal. No aortic   regurgitation or paravalvular leak.   RV is mildly dilated with mildly decreased systolic  function. _____________   History of Present Illness     [name] is a 66 y.o. male with known to have NSTEMI s/p CABG, recent cath for NSTEMI requiring POBA for Lcx ISR, bioprosthetic aortic valve and CABG with LIMA, HTN, DM2, recent BRBPR from diverticuli on 03/14/2017.   He woke up with stiffness in the left arm and neck in the morning of admission. It did improved afterwards. [place] and there he developed the neck pain and the arm pain along with left sided chest pain. The chest pain was still 2/10 during exam in the ED. It was similar to his pain when he had with last nstemi. Patient denied SOB, diaphoresis, n/v, PE history otherwise.  Of note, patient had an episode of nstemi on 8/26 and underwent cath by Dr. Kirke Corin at which time had had obstructive ISR lesions of the LCx that required POBA. There was residual mild stenosis post POBA.   In the ED, first troponin was negative. Given symptoms he was admitted for further work up.    Hospital Course    Started on IV heparin. Trop peaked at 0.05. He underwent cardiac cath noted above with significant ISR in stents to the p/d Lcx, and in the ostium of the diag. Had successful cutting balloon intervention to the p/d Lcx, and difficult but successful PCI to the diag with lesion being reduced to 50%. Plan for DAPT with ASA/plavix indefinitely. Medical therapy for residual disease with nitrates, BB, statin. Follow up echo showed a decline in EF from normal back in 8/18 to 35-40% with with stable AV. Post cath labs were stable with Cr 0.85 and Hgb 12.1. No chest pain post cath. Able to ambulate with cardiac rehab without any issues. Noted to have episodes of atrial tachycardia this admission. No signs of volume overload on exam, therefore no diuretic that the time of discharge. Instructed on the signs and symptoms of HF.   General: Well developed, well nourished, male appearing in no acute distress. Head: Normocephalic,  atraumatic.  Neck: Supple without bruits, JVD. Lungs:  Resp regular and unlabored, CTA. Heart: RRR, S1, S2, no S3, S4, or murmur; no rub. Abdomen: Soft, non-tender, non-distended with normoactive bowel sounds. No hepatomegaly. No rebound/guarding. No obvious abdominal masses. Extremities: No clubbing, cyanosis, edema. Distal pedal pulses are 2+ bilaterally. R femoral cath site stable without bruising or hematoma Neuro: Alert and oriented X 3. Moves all extremities spontaneously. Psych: Normal affect.  Elmon Else Sr. was seen by Dr. Eldridge Dace and determined stable for discharge home. Follow up in the office has been arranged. Medications are listed below.   _____________  Discharge Vitals Blood pressure (!) 122/57, pulse 80, temperature 98 F (36.7 C), temperature source Oral, resp. rate 13, height 5\' 10"  (1.778 m), weight 225 lb 1.4 oz (102.1 kg), SpO2 94 %.  Filed Weights   04/02/17 0452 04/02/17 1959 04/03/17 0200  Weight: 225 lb 3.2 oz (102.2 kg) 225 lb (102.1 kg) 225 lb 1.4 oz (102.1 kg)    Labs & Radiologic Studies    CBC  Recent Labs  04/01/17 1022 04/02/17 0355 04/03/17 0250  WBC 9.7 10.3  8.6  NEUTROABS 6.2  --   --   HGB 12.8* 12.8* 12.1*  HCT 39.0 38.7* 36.7*  MCV 83.3 83.2 83.2  PLT 224 215 198   Basic Metabolic Panel  Recent Labs  04/02/17 1216 04/03/17 0250  NA 137 137  K 3.6 3.6  CL 101 104  CO2 28 27  GLUCOSE 155* 169*  BUN 11 11  CREATININE 0.67 0.85  CALCIUM 9.1 8.7*   Liver Function Tests  Recent Labs  04/01/17 0855  AST 21  ALT 19  ALKPHOS 68  BILITOT 1.0  PROT 5.9*  ALBUMIN 3.2*   No results for input(s): LIPASE, AMYLASE in the last 72 hours. Cardiac Enzymes  Recent Labs  04/01/17 0201 04/01/17 0855 04/02/17 2003  TROPONINI 0.05* 0.04* 0.05*   BNP Invalid input(s): POCBNP D-Dimer No results for input(s): DDIMER in the last 72 hours. Hemoglobin A1C  Recent Labs  03/31/17 2130  HGBA1C 8.1*   Fasting Lipid  Panel  Recent Labs  03/31/17 2130  CHOL 108  HDL 37*  LDLCALC 43  TRIG 962  CHOLHDL 2.9   Thyroid Function Tests  Recent Labs  03/31/17 2130  TSH 2.117   _____________  Dg Chest Portable 1 View  Result Date: 03/31/2017 CLINICAL DATA:  Chest pain EXAM: PORTABLE CHEST 1 VIEW COMPARISON:  02/24/2017. FINDINGS: Previous median sternotomy and CABG procedure. Mild cardiac enlargement. Both lungs are clear. The visualized skeletal structures are unremarkable. IMPRESSION: No active disease. Electronically Signed   By: Signa Kell M.D.   On: 03/31/2017 17:35   Disposition   Pt is being discharged home today in good condition.  Follow-up Plans & Appointments    Follow-up Information    Sondra Barges, PA-C Follow up on 05/15/2017.   Specialties:  Physician Assistant, Cardiology, Radiology Why:  at 2pm for your follow up appt. Office will call you with a sooner appt.  Contact information: [address] [place] [address] [phone]          Discharge Instructions    (HEART FAILURE PATIENTS) Call MD:  Anytime you have any of the following symptoms: 1) 3 pound weight gain in 24 hours or 5 pounds in 1 week 2) shortness of breath, with or without a dry hacking cough 3) swelling in the hands, feet or stomach 4) if you have to sleep on extra pillows at night in order to breathe.    Complete by:  As directed    Amb Referral to Cardiac Rehabilitation    Complete by:  As directed    Diagnosis:  PTCA   Call MD for:  redness, tenderness, or signs of infection (pain, swelling, redness, odor or green/yellow discharge around incision site)    Complete by:  As directed    Diet - low sodium heart healthy    Complete by:  As directed    Discharge instructions    Complete by:  As directed    Groin Site Care Refer to this sheet in the next few weeks. These instructions provide you with information on caring for yourself after your procedure. Your caregiver may also give  you more specific instructions. Your treatment has been planned according to current medical practices, but problems sometimes occur. Call your caregiver if you have any problems or questions after your procedure. HOME CARE INSTRUCTIONS You may shower 24 hours after the procedure. Remove the bandage (dressing) and gently wash the site with plain soap and water. Gently pat the site  dry.  Do not apply powder or lotion to the site.  Do not sit in a bathtub, swimming pool, or whirlpool for 5 to 7 days.  No bending, squatting, or lifting anything over 10 pounds (4.5 kg) as directed by your caregiver.  Inspect the site at least twice daily.  Do not drive home if you are discharged the same day of the procedure. Have someone else drive you.  You may drive 24 hours after the procedure unless otherwise instructed by your caregiver.  What to expect: Any bruising will usually fade within 1 to 2 weeks.  Blood that collects in the tissue (hematoma) may be painful to the touch. It should usually decrease in size and tenderness within 1 to 2 weeks.  SEEK IMMEDIATE MEDICAL CARE IF: You have unusual pain at the groin site or down the affected leg.  You have redness, warmth, swelling, or pain at the groin site.  You have drainage (other than a small amount of blood on the dressing).  You have chills.  You have a fever or persistent symptoms for more than 72 hours.  You have a fever and your symptoms suddenly get worse.  Your leg becomes pale, cool, tingly, or numb.  You have heavy bleeding from the site. Hold pressure on the site. Marland Kitchen  PLEASE DO NOT MISS ANY DOSES OF YOUR PLAVIX!!!!! Also keep a log of you blood pressures and bring back to your follow up appt. Please call the office with any questions.   Patients taking blood thinners should generally stay away from medicines like ibuprofen, Advil, Motrin, naproxen, and Aleve due to risk of stomach bleeding. You may take Tylenol as directed or talk to your  primary doctor about alternatives.   Increase activity slowly    Complete by:  As directed       Discharge Medications     Medication List    TAKE these medications   aspirin EC 81 MG tablet Take 162 mg by mouth daily.   atorvastatin 80 MG tablet Commonly known as:  LIPITOR Take 80 mg by mouth at bedtime.   clopidogrel 75 MG tablet Commonly known as:  PLAVIX Take 75 mg by mouth daily.   fluticasone 50 MCG/ACT nasal spray Commonly known as:  FLONASE Place 1 spray into both nostrils daily.   isosorbide mononitrate 30 MG 24 hr tablet Commonly known as:  IMDUR Take 1 tablet (30 mg total) by mouth daily.   lisinopril 10 MG tablet Commonly known as:  PRINIVIL,ZESTRIL Take 10 mg by mouth at bedtime.   meclizine 25 MG tablet Commonly known as:  ANTIVERT Take 25 mg by mouth 3 (three) times daily as needed for dizziness.   metFORMIN 500 MG 24 hr tablet Commonly known as:  GLUCOPHAGE-XR Take 500 mg by mouth.   metoprolol succinate 50 MG 24 hr tablet Commonly known as:  TOPROL-XL Take 1 tablet (50 mg total) by mouth daily. Take with or immediately following a meal. What changed:  medication strength  how much to take  additional instructions   nitroGLYCERIN 0.4 MG SL tablet Commonly known as:  NITROSTAT Place 1 tablet (0.4 mg total) under the tongue every 5 (five) minutes as needed for chest pain.   pantoprazole 40 MG tablet Commonly known as:  PROTONIX Take 40 mg by mouth daily.        Aspirin prescribed at discharge?  Yes High Intensity Statin Prescribed? (Lipitor 40-80mg  or Crestor 20-40mg ): Yes Beta Blocker Prescribed? Yes For EF <40%,  was ACEI/ARB Prescribed? Yes ADP Receptor Inhibitor Prescribed? (i.e. Plavix etc.-Includes Medically Managed Patients): Yes For EF <40%, Aldosterone Inhibitor Prescribed? No: consider as outpatient if indicated.  Was EF assessed during THIS hospitalization? Yes Was Cardiac Rehab II ordered? (Included Medically managed  Patients): Yes   Outstanding Labs/Studies   Follow up echo in 6 weeks to reassess LV function.   Duration of Discharge Encounter   Greater than 30 minutes including physician time.  Signed, Laverda Page NP-C 04/03/2017, 12:29 PM   I have examined the patient and reviewed assessment and plan and discussed with patient.  Agree with above as stated.  S/p PCI.  GI bleed a few weeks ago.  COntinue DAPT.  No NOAC.  H/o PAF.  Beta blocker for atrial tach and atrial fib.  RRR. S1 S2, no wheezing, no right groin hematoma, 2+ right femoral pulse; tr right DP pulse.  He informs me that it is dfficult to feel pulses in his feet, he has been told.  RIght foot warm.  No problems when walking.  He is from [place] and will f/u there.   Lance Muss

## 2017-04-05 ENCOUNTER — Telehealth: Payer: Self-pay | Admitting: Internal Medicine

## 2017-04-05 NOTE — Telephone Encounter (Signed)
Received page from patient. Having L hand tingling. Has had this before but only when he is having chest pain. Currently patient is having no chest pain or pressure. Was recently admitted and underwent coronary angiography (femoral access). Patient can move hand normally. No decreased sensation or numbness. No weakness. No loss of balance. No visual disturbance. No other neurologic deficits. Patient is concerned that his chest pain might come back.   Offered reassurance that this is unlikely to be related to his cath and unlikely related to his coronary arteries. I told him that there is a small risk of stroke or TIA after cath, however his symptoms do not sound consistent with stroke/TIA. He agrees to continue to monitor his symptoms and call me back if he develops any new neurological or pain symptoms.   Rosario Jacks, MD Cardiology Fellow, PGY-5

## 2017-04-05 NOTE — Telephone Encounter (Signed)
Patient contacted regarding discharge from [facility] on 04/03/17.   Patient understands to follow up with provider ? On 04/16/17 at 2:40 pm at [facility] with Dr Kirke Corin.  Patient understands discharge instructions? Yes   Patient understands medications and regiment? Yes   Patient understands to bring all medications to this visit? Yes  Patient given address of the office and verbalized understanding.

## 2017-04-16 ENCOUNTER — Ambulatory Visit (INDEPENDENT_AMBULATORY_CARE_PROVIDER_SITE_OTHER): Payer: Medicaid Other | Admitting: Cardiovascular Disease

## 2017-04-16 ENCOUNTER — Encounter: Payer: Self-pay | Admitting: Cardiovascular Disease

## 2017-04-16 VITALS — BP 110/60 | HR 115 | Ht 70.5 in | Wt 230.0 lb

## 2017-04-16 DIAGNOSIS — I5022 Chronic systolic (congestive) heart failure: Secondary | ICD-10-CM

## 2017-04-16 DIAGNOSIS — E785 Hyperlipidemia, unspecified: Secondary | ICD-10-CM | POA: Diagnosis not present

## 2017-04-16 DIAGNOSIS — I480 Paroxysmal atrial fibrillation: Secondary | ICD-10-CM | POA: Diagnosis not present

## 2017-04-16 DIAGNOSIS — I10 Essential (primary) hypertension: Secondary | ICD-10-CM | POA: Diagnosis not present

## 2017-04-16 DIAGNOSIS — I2510 Atherosclerotic heart disease of native coronary artery without angina pectoris: Secondary | ICD-10-CM

## 2017-04-16 MED ORDER — METOPROLOL SUCCINATE ER 100 MG PO TB24: 100.0000 mg | ORAL_TABLET | Freq: Every day | ORAL | 3 refills | Status: DC

## 2017-04-16 MED ORDER — NITROGLYCERIN 0.4 MG SL SUBL: 0.4000 mg | SUBLINGUAL_TABLET | SUBLINGUAL | 2 refills | Status: DC | PRN

## 2017-04-16 NOTE — Progress Notes (Signed)
Cardiology Office Note   Date:  04/16/2017   ID:  Christopher Else Sr., DOB [birth year], MRN [id number]  PCP:  System, Pcp Not In  Cardiologist:   Lorine Bears, MD   Chief Complaint  Patient presents with  . other    Follow up from [facility] s/p stent placement. Meds reviewed by the pt.'s bottles. Pt. c/o rapid pounding  heart beats at times.       History of Present Illness: Christopher Buffin Sr. is a 66 y.o. male who presents for follow-up visit after recent hospitalization for unstable angina. The patient has extensive cardiac history with previous CABG (LIMA to LAD) , left circumflex stenting and bioprosthetic aortic valve replacement. These were done in [place]. The patient moved to [place] to be close to family. He had non-ST elevation myocardial infarction in August of this year and was hospitalized at [facility]. 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 The diagonal was long segment of stents and not suitable for angioplasty. It was left to be treated medically.He had balloon angioplasty for in-stent restenosis in the proximal and distal left circumflex. He subsequently had lower GI bleed but dual antiplatelet therapy was not interrupted.  He presented last month to [facility] with unstable angina.he underwent cardiac catheterization which showed restenosis in the left circumflex.he underwent repeat cutting balloon angioplasty to the left circumflex and diagonal. The left circumflex results were optimal but diagonal angioplasty was suboptimal with 50% residual stenosis. Echocardiogram showed an EF of 35-40% with normal functioning bioprosthetic aortic valve, mild mitral regurgitation and no pulmonary hypertension.  Other medical problems include atrial fibrillation, hyperlipidemia, diabetes mellitus and hypertension.  Since hospital discharge, he has been doing reasonably well with no recurrent chest pain. He did have left  arm discomfort which was different from his prior angina.He also noted increased palpitations. He is noted to be in atrial fibrillation with heart rate of 115 bpm.   Past Medical History:  Diagnosis Date  . Acute systolic heart failure (HCC)   . CAD (coronary artery disease)    10/18 ISR to the p/d LCX, and ostium of diag treated with cutting balloon. EF 35%  . Colitis   . Diabetes mellitus without complication (HCC)   . Diverticulitis   . Hyperlipidemia   . Hypertension     Past Surgical History:  Procedure Laterality Date  . AORTIC VALVE REPLACEMENT    . CORONARY ARTERY BYPASS GRAFT    . CORONARY BALLOON ANGIOPLASTY N/A 04/02/2017   Procedure: CORONARY BALLOON ANGIOPLASTY;  Surgeon: Lennette Bihari, MD;  Location: [facility] INVASIVE CV LAB;  Service: Cardiovascular;  Laterality: N/A;  . CORONARY STENT INTERVENTION N/A 02/26/2017   Procedure: CORONARY STENT INTERVENTION;  Surgeon: Iran Ouch, MD;  Location: [facility] INVASIVE CV LAB;  Service: Cardiovascular;  Laterality: N/A;  . CORONARY/GRAFT ANGIOGRAPHY N/A 02/26/2017   Procedure: CORONARY/GRAFT ANGIOGRAPHY;  Surgeon: Iran Ouch, MD;  Location: [facility] INVASIVE CV LAB;  Service: Cardiovascular;  Laterality: N/A;  . CORONARY/GRAFT ANGIOGRAPHY N/A 04/02/2017   Procedure: CORONARY/GRAFT ANGIOGRAPHY;  Surgeon: Lennette Bihari, MD;  Location: [facility] INVASIVE CV LAB;  Service: Cardiovascular;  Laterality: N/A;  . KNEE ARTHROPLASTY  2000     Current Outpatient Prescriptions  Medication Sig Dispense Refill  . aspirin EC 81 MG tablet Take 162 mg by mouth daily.     Marland Kitchen atorvastatin (LIPITOR) 80 MG tablet Take 80 mg by mouth at bedtime.     Marland Kitchen  clopidogrel (PLAVIX) 75 MG tablet Take 75 mg by mouth daily.    Marland Kitchen lisinopril (PRINIVIL,ZESTRIL) 10 MG tablet Take 10 mg by mouth at bedtime.  3  . meclizine (ANTIVERT) 25 MG tablet Take 25 mg by mouth 3 (three) times daily as needed for dizziness.    . metFORMIN (GLUCOPHAGE-XR) 500 MG 24 hr tablet Take 500  mg by mouth.    . pantoprazole (PROTONIX) 40 MG tablet Take 40 mg by mouth daily.    . metoprolol succinate (TOPROL-XL) 100 MG 24 hr tablet Take 1 tablet (100 mg total) by mouth daily. Take with or immediately following a meal. 90 tablet 3  . nitroGLYCERIN (NITROSTAT) 0.4 MG SL tablet Place 1 tablet (0.4 mg total) under the tongue every 5 (five) minutes as needed for chest pain. 30 tablet 2   No current facility-administered medications for this visit.     Allergies:   Metformin and related    Social History:  The patient  reports that he has never smoked. He has never used smokeless tobacco. He reports that he does not drink alcohol or use drugs.   Family History:  The patient's family history includes CAD in his brother and mother; Diabetes in his sister; Heart attack in his brother; Heart failure in his mother; Lupus in his mother; Prostate cancer in his father.    ROS:  Please see the history of present illness.   Otherwise, review of systems are positive for none.   All other systems are reviewed and negative.    PHYSICAL EXAM: VS:  BP 110/60 (BP Location: Left Arm, Patient Position: Sitting, Cuff Size: Normal)   Pulse (!) 115   Ht 5' 10.5" (1.791 m)   Wt 230 lb (104.3 kg)   BMI 32.54 kg/m  , BMI Body mass index is 32.54 kg/m. GEN: Well nourished, well developed, in no acute distress  HEENT: normal  Neck: no JVD, carotid bruits, or masses Cardiac: irregularly irregular and mildly tachycardic; no murmurs, rubs, or gallops,no edema  Respiratory:  clear to auscultation bilaterally, normal work of breathing GI: soft, nontender, nondistended, + BS MS: no deformity or atrophy  Skin: warm and dry, no rash Neuro:  Strength and sensation are intact Psych: euthymic mood, full affect Right femoral pulse is normal with no groin hematoma  EKG:  EKG is ordered today. The ekg ordered today demonstrates atrial fibrillation with rapid ventricular response, incomplete right bundle branch  block with possible old inferior infarct.   Recent Labs: 03/13/2017: Magnesium 1.7 03/31/2017: B Natriuretic Peptide 171.0; TSH 2.117 04/01/2017: ALT 19 04/03/2017: BUN 11; Creatinine, Ser 0.85; Hemoglobin 12.1; Platelets 198; Potassium 3.6; Sodium 137    Lipid Panel    Component Value Date/Time   CHOL 108 03/31/2017 2130   TRIG 138 03/31/2017 2130   HDL 37 (L) 03/31/2017 2130   CHOLHDL 2.9 03/31/2017 2130   VLDL 28 03/31/2017 2130   LDLCALC 43 03/31/2017 2130      Wt Readings from Last 3 Encounters:  04/16/17 230 lb (104.3 kg)  04/03/17 225 lb 1.4 oz (102.1 kg)  03/13/17 225 lb (102.1 kg)       No flowsheet data found.    ASSESSMENT AND PLAN:  1.  Coronary artery disease involving native coronary arteries: Recent unstable angina due to in-stent restenosis status post successful balloon angioplasty. High risk for restenosis. No repeat stenting was performed given questionable ability to take long-term dual antiplatelet therapy. Continue treatment with aspirin and Plavix for now. I  referred him to cardiac rehabilitation. The diagonal artery is high risk for occlusion and probably not worth attempting angioplasty in the future. The left circumflex can be treated with drug-eluting stent placement if he tolerates long-term dual antiplatelet therapy.  2. Chronic systolic heart failure: He appears to be euvolemic. Continue treatment with lisinopril and metoprolol.  3. Paroxysmal atrial fibrillation: He is currently tachycardic. I elected to increase Toprol to 100 mg once daily. I discontinued isosorbide in order to avoid hypotension. We might need to consider anticoagulation in the future but I would like to avoid that at the present time given recurrent GI bleed. He is currently on dual antiplatelet therapy.  4. History of aortic valve replacement: The valve seems to be functioning normally. Continue endocarditis prophylaxis.  5. Hyperlipidemia: Continue high dose  atorvastatin.  The patient was referred to establish with a primary care physician.    Disposition:   FU with me in 1 month  Signed,  Lorine Bears, MD  04/16/2017 6:07 PM    [facility]

## 2017-04-16 NOTE — Patient Instructions (Addendum)
Medication Instructions:  Your physician has recommended you make the following change in your medication:  STOP taking imdur INCREASE metoprolol to  once daily   Labwork: none  Testing/Procedures: none  Follow-Up: Your physician recommends that you schedule a follow-up appointment in: 1 month with Dr. Kirke Corin.    Any Other Special Instructions Will Be Listed Below (If Applicable). You should receive a call from Cardiac Rehab. If you do not hear from them this week, please call [phone].  We will call with a Primary Care appointment.     If you need a refill on your cardiac medications before your next appointment, please call your pharmacy.

## 2017-04-17 ENCOUNTER — Telehealth: Payer: Self-pay | Admitting: Cardiovascular Disease

## 2017-04-17 NOTE — Telephone Encounter (Signed)
Scheduled a new patient appointment with Christopher Cummings at [facility], October 29, 2:20pm. Attempted to notify patient but he is not available at this time. Will call again.

## 2017-04-17 NOTE — Telephone Encounter (Signed)
Routed to Dr. Kirke Corin to determine if 2 month follow up is acceptable.

## 2017-04-17 NOTE — Telephone Encounter (Signed)
Patient scheduled for fu needs 1 m  but scheduled next available with Kirke Corin 12/11 is this ok to keep   Please advise

## 2017-04-17 NOTE — Telephone Encounter (Signed)
Provided PCP appt information with pt who is appreciative of the appointment. He will discuss date w/family member who provides transportation and will call [facility] if he needs to reschedule.

## 2017-04-18 NOTE — Telephone Encounter (Signed)
Ok but get earlier if any cancellations.

## 2017-04-18 NOTE — Telephone Encounter (Signed)
Added to wait list.

## 2017-04-23 ENCOUNTER — Telehealth: Payer: Self-pay | Admitting: Cardiology

## 2017-04-23 ENCOUNTER — Emergency Department: Payer: BLUE CROSS/BLUE SHIELD

## 2017-04-23 ENCOUNTER — Encounter: Payer: Self-pay | Admitting: Emergency Medicine

## 2017-04-23 ENCOUNTER — Inpatient Hospital Stay
Admission: EM | Admit: 2017-04-23 | Discharge: 2017-04-26 | DRG: 303 | Disposition: A | Discharge status: Home / Self Care | Payer: BLUE CROSS/BLUE SHIELD | Attending: Internal Medicine | Admitting: Internal Medicine

## 2017-04-23 DIAGNOSIS — I2510 Atherosclerotic heart disease of native coronary artery without angina pectoris: Secondary | ICD-10-CM | POA: Diagnosis present

## 2017-04-23 DIAGNOSIS — E119 Type 2 diabetes mellitus without complications: Secondary | ICD-10-CM

## 2017-04-23 DIAGNOSIS — I340 Nonrheumatic mitral (valve) insufficiency: Secondary | ICD-10-CM | POA: Diagnosis present

## 2017-04-23 DIAGNOSIS — I200 Unstable angina: Secondary | ICD-10-CM | POA: Diagnosis present

## 2017-04-23 DIAGNOSIS — Z8249 Family history of ischemic heart disease and other diseases of the circulatory system: Secondary | ICD-10-CM

## 2017-04-23 DIAGNOSIS — Z951 Presence of aortocoronary bypass graft: Secondary | ICD-10-CM

## 2017-04-23 DIAGNOSIS — I25110 Atherosclerotic heart disease of native coronary artery with unstable angina pectoris: Principal | ICD-10-CM | POA: Diagnosis present

## 2017-04-23 DIAGNOSIS — E785 Hyperlipidemia, unspecified: Secondary | ICD-10-CM | POA: Diagnosis present

## 2017-04-23 DIAGNOSIS — I480 Paroxysmal atrial fibrillation: Secondary | ICD-10-CM | POA: Diagnosis present

## 2017-04-23 DIAGNOSIS — I483 Typical atrial flutter: Secondary | ICD-10-CM | POA: Diagnosis present

## 2017-04-23 DIAGNOSIS — Z7902 Long term (current) use of antithrombotics/antiplatelets: Secondary | ICD-10-CM

## 2017-04-23 DIAGNOSIS — Z955 Presence of coronary angioplasty implant and graft: Secondary | ICD-10-CM

## 2017-04-23 DIAGNOSIS — Z833 Family history of diabetes mellitus: Secondary | ICD-10-CM

## 2017-04-23 DIAGNOSIS — Z7984 Long term (current) use of oral hypoglycemic drugs: Secondary | ICD-10-CM

## 2017-04-23 DIAGNOSIS — I252 Old myocardial infarction: Secondary | ICD-10-CM

## 2017-04-23 DIAGNOSIS — Z8042 Family history of malignant neoplasm of prostate: Secondary | ICD-10-CM

## 2017-04-23 DIAGNOSIS — I255 Ischemic cardiomyopathy: Secondary | ICD-10-CM | POA: Diagnosis present

## 2017-04-23 DIAGNOSIS — I110 Hypertensive heart disease with heart failure: Secondary | ICD-10-CM | POA: Diagnosis present

## 2017-04-23 DIAGNOSIS — Z7982 Long term (current) use of aspirin: Secondary | ICD-10-CM

## 2017-04-23 DIAGNOSIS — I481 Persistent atrial fibrillation: Secondary | ICD-10-CM | POA: Diagnosis present

## 2017-04-23 DIAGNOSIS — Z953 Presence of xenogenic heart valve: Secondary | ICD-10-CM

## 2017-04-23 DIAGNOSIS — R079 Chest pain, unspecified: Secondary | ICD-10-CM | POA: Diagnosis not present

## 2017-04-23 DIAGNOSIS — I10 Essential (primary) hypertension: Secondary | ICD-10-CM | POA: Diagnosis present

## 2017-04-23 DIAGNOSIS — I5022 Chronic systolic (congestive) heart failure: Secondary | ICD-10-CM | POA: Diagnosis present

## 2017-04-23 LAB — TROPONIN I: TROPONIN I: 0.05 ng/mL — AB (ref <0.03)

## 2017-04-23 LAB — APTT: aPTT: 30 seconds (ref 24–36)

## 2017-04-23 LAB — PROTIME-INR
INR: 1.18
Prothrombin Time: 14.9 seconds (ref 11.4–15.2)

## 2017-04-23 LAB — BASIC METABOLIC PANEL
Anion gap: 11 (ref 5–15)
BUN: 13 mg/dL (ref 6–20)
CALCIUM: 9.3 mg/dL (ref 8.9–10.3)
CO2: 24 mmol/L (ref 22–32)
Chloride: 101 mmol/L (ref 101–111)
Creatinine, Ser: 0.82 mg/dL (ref 0.61–1.24)
GFR calc non Af Amer: >60 mL/min (ref >60)
GLUCOSE: 302 mg/dL — AB (ref 65–99)
Potassium: 3.6 mmol/L (ref 3.5–5.1)
Sodium: 136 mmol/L (ref 135–145)

## 2017-04-23 LAB — BRAIN NATRIURETIC PEPTIDE: B Natriuretic Peptide: 158 pg/mL — ABNORMAL HIGH (ref 0.0–100.0)

## 2017-04-23 LAB — CBC
HCT: 37.4 % — ABNORMAL LOW (ref 40.0–52.0)
Hemoglobin: 13 g/dL (ref 13.0–18.0)
MCH: 28.1 pg (ref 26.0–34.0)
MCHC: 34.8 g/dL (ref 32.0–36.0)
MCV: 80.8 fL (ref 80.0–100.0)
PLATELETS: 222 10*3/uL (ref 150–440)
RBC: 4.63 MIL/uL (ref 4.40–5.90)
RDW: 14.5 % (ref 11.5–14.5)
WBC: 8.5 10*3/uL (ref 3.8–10.6)

## 2017-04-23 MED ORDER — HEPARIN BOLUS VIA INFUSION
4000.0000 [IU] | Freq: Once | INTRAVENOUS | Status: AC
  Administered 2017-04-23: 4000 [IU] via INTRAVENOUS
  Filled 2017-04-23: qty 4000

## 2017-04-23 MED ORDER — HEPARIN (PORCINE) IN NACL 100-0.45 UNIT/ML-% IJ SOLN
1500.0000 [IU]/h | INTRAMUSCULAR | Status: DC
  Administered 2017-04-23: 1250 [IU]/h via INTRAVENOUS
  Administered 2017-04-24: 1300 [IU]/h via INTRAVENOUS
  Filled 2017-04-23 (×2): qty 250

## 2017-04-23 NOTE — H&P (Signed)
[facility] [place] at [facility]   PATIENT NAME: Christopher Cummings    MR#:  [id number]  DATE OF BIRTH:  08-01-1950  DATE OF ADMISSION:  04/23/2017  PRIMARY CARE PHYSICIAN: Patient, No Pcp Per   REQUESTING/REFERRING PHYSICIAN: Siadecki, MD  CHIEF COMPLAINT:   Chief Complaint  Patient presents with  . Chest Pain    HISTORY OF PRESENT ILLNESS:  Christopher Cummings  is a 66 y.o. male who presents with chest pain.  Patient was recently admitted for cardiac cath. He states that this chest pain tonight was somewhat different than his prior chest pain initially, as was initially sharp. He states that pain lasted for just about a minute and resolved, but residual pain was more of an aching feeling here his troponin was mildly positive at 0.05. Hospitalists were called for admission and further evaluation  PAST MEDICAL HISTORY:   Past Medical History:  Diagnosis Date  . Acute systolic heart failure (HCC)   . CAD (coronary artery disease)    10/18 ISR to the p/d LCX, and ostium of diag treated with cutting balloon. EF 35%  . Colitis   . Diabetes mellitus without complication (HCC)   . Diverticulitis   . Hyperlipidemia   . Hypertension     PAST SURGICAL HISTORY:   Past Surgical History:  Procedure Laterality Date  . AORTIC VALVE REPLACEMENT    . CORONARY ARTERY BYPASS GRAFT    . CORONARY BALLOON ANGIOPLASTY N/A 04/02/2017   Procedure: CORONARY BALLOON ANGIOPLASTY;  Surgeon: Lennette BihariKelly, Thomas A, MD;  Location: [facility] INVASIVE CV LAB;  Service: Cardiovascular;  Laterality: N/A;  . CORONARY STENT INTERVENTION N/A 02/26/2017   Procedure: CORONARY STENT INTERVENTION;  Surgeon: Iran OuchArida, Muhammad A, MD;  Location: [facility] INVASIVE CV LAB;  Service: Cardiovascular;  Laterality: N/A;  . CORONARY/GRAFT ANGIOGRAPHY N/A 02/26/2017   Procedure: CORONARY/GRAFT ANGIOGRAPHY;  Surgeon: Iran OuchArida, Muhammad A, MD;  Location: [facility] INVASIVE CV LAB;  Service: Cardiovascular;  Laterality: N/A;  .  CORONARY/GRAFT ANGIOGRAPHY N/A 04/02/2017   Procedure: CORONARY/GRAFT ANGIOGRAPHY;  Surgeon: Lennette BihariKelly, Thomas A, MD;  [facility];  Service: Cardiovascular;  Laterality: N/A;  . JOINT REPLACEMENT Left    KNEE  . KNEE ARTHROPLASTY  2000    SOCIAL HISTORY:   Social History  Substance Use Topics  . Smoking status: Never Smoker  . Smokeless tobacco: Never Used  . Alcohol use No     Comment: No Alcohol 12 years    FAMILY HISTORY:   Family History  Problem Relation Age of Onset  . CAD Mother   . Heart failure Mother   . Lupus Mother   . CAD Brother   . Heart attack Brother   . Prostate cancer Father   . Diabetes Sister     DRUG ALLERGIES:   Allergies  Allergen Reactions  . Metformin And Related Other (See Comments)    Only the regular Metformin    MEDICATIONS AT HOME:   Prior to Admission medications   Medication Sig Start Date End Date Taking? Authorizing Provider  aspirin EC 81 MG tablet Take 162 mg by mouth daily.     [provider]  atorvastatin (LIPITOR) 80 MG tablet Take 80 mg by mouth at bedtime.     [provider]  clopidogrel (PLAVIX) 75 MG tablet Take 75 mg by mouth daily.    [provider]  lisinopril (PRINIVIL,ZESTRIL) 10 MG tablet Take 10 mg by mouth at bedtime. 02/20/17   [provider]  meclizine (ANTIVERT) 25 MG tablet Take 25 mg by mouth 3 (three) times daily as needed for dizziness.    [provider]  metFORMIN (GLUCOPHAGE-XR) 500 MG 24 hr tablet Take 500 mg by mouth. 03/22/17 03/22/18  [provider]  metoprolol succinate (TOPROL-XL) 100 MG 24 hr tablet Take 1 tablet (100 mg total) by mouth daily. Take with or immediately following a meal. 04/16/17 07/15/17  Iran Ouch, MD  nitroGLYCERIN (NITROSTAT) 0.4 MG SL tablet Place 1 tablet (0.4 mg total) under the tongue every 5 (five) minutes as needed for chest pain. 04/16/17   Iran Ouch, MD  pantoprazole (PROTONIX) 40 MG tablet  Take 40 mg by mouth daily.    [provider]    REVIEW OF SYSTEMS:  Review of Systems  Constitutional: Negative for chills, fever, malaise/fatigue and weight loss.  HENT: Negative for ear pain, hearing loss and tinnitus.   Eyes: Negative for blurred vision, double vision, pain and redness.  Respiratory: Negative for cough, hemoptysis and shortness of breath.   Cardiovascular: Positive for chest pain. Negative for palpitations, orthopnea and leg swelling.  Gastrointestinal: Negative for abdominal pain, constipation, diarrhea, nausea and vomiting.  Genitourinary: Negative for dysuria, frequency and hematuria.  Musculoskeletal: Negative for back pain, joint pain and neck pain.  Skin:       No acne, rash, or lesions  Neurological: Negative for dizziness, tremors, focal weakness and weakness.  Endo/Heme/Allergies: Negative for polydipsia. Does not bruise/bleed easily.  Psychiatric/Behavioral: Negative for depression. The patient is not nervous/anxious and does not have insomnia.      VITAL SIGNS:   Vitals:   04/23/17 2030 04/23/17 2100 04/23/17 2130 04/23/17 2200  BP: (!) 167/103 (!) 141/96 133/71 121/76  Pulse: (!) 110 68  (!) 109  Resp: 12   17  Temp:      TempSrc:      SpO2: 94% 96%  93%  Weight:      Height:       Wt Readings from Last 3 Encounters:  04/23/17 104.3 kg (230 lb)  04/16/17 104.3 kg (230 lb)  04/03/17 102.1 kg (225 lb 1.4 oz)    PHYSICAL EXAMINATION:  Physical Exam  Vitals reviewed. Constitutional: He is oriented to person, place, and time. He appears well-developed and well-nourished. No distress.  HENT:  Head: Normocephalic and atraumatic.  Mouth/Throat: Oropharynx is clear and moist.  Eyes: Pupils are equal, round, and reactive to light. Conjunctivae and EOM are normal. No scleral icterus.  Neck: Normal range of motion. Neck supple. No JVD present. No thyromegaly present.  Cardiovascular: Normal rate and intact distal pulses.  Exam reveals no  gallop and no friction rub.   No murmur heard. Irregular rhythm  Respiratory: Effort normal and breath sounds normal. No respiratory distress. He has no wheezes. He has no rales.  GI: Soft. Bowel sounds are normal. He exhibits no distension. There is no tenderness.  Musculoskeletal: Normal range of motion. He exhibits no edema.  No arthritis, no gout  Lymphadenopathy:    He has no cervical adenopathy.  Neurological: He is alert and oriented to person, place, and time. No cranial nerve deficit.  No dysarthria, no aphasia  Skin: Skin is warm and dry. No rash noted. No erythema.  Psychiatric: He has a normal mood and affect. His behavior is normal. Judgment and thought content normal.    LABORATORY PANEL:   CBC  Recent Labs Lab 04/23/17 2011  WBC 8.5  HGB 13.0  HCT 37.4*  PLT 222   ------------------------------------------------------------------------------------------------------------------  Chemistries   Recent Labs Lab 04/23/17 2011  NA 136  K 3.6  CL 101  CO2 24  GLUCOSE 302*  BUN 13  CREATININE 0.82  CALCIUM 9.3   ------------------------------------------------------------------------------------------------------------------  Cardiac Enzymes  Recent Labs Lab 04/23/17 2011  TROPONINI 0.05*   ------------------------------------------------------------------------------------------------------------------  RADIOLOGY:  Dg Chest 2 View  Result Date: 04/23/2017 CLINICAL DATA:  66 year old male with history of central chest pain today. EXAM: CHEST  2 VIEW COMPARISON:  Chest x-ray 04/01/2015. FINDINGS: Lung volumes are slightly low. No acute consolidative airspace disease. No pleural effusions. Cephalization of the pulmonary vasculature, without frank pulmonary edema. Heart size is mildly enlarged. Upper mediastinal contours are within normal limits. Status post median sternotomy for aortic valve replacement and CABG including LIMA to the LAD. IMPRESSION:  1. Pulmonary venous congestion, without frank pulmonary edema. 2. Postoperative changes, as above. Electronically Signed   By: Trudie Reed M.D.   On: 04/23/2017 20:33    EKG:   Orders placed or performed during the hospital encounter of 04/23/17  . EKG 12-Lead  . EKG 12-Lead  . ED EKG within 10 minutes  . ED EKG within 10 minutes    IMPRESSION AND PLAN:  Principal Problem:   Unstable angina (HCC) - cycle cardiac enzymes, get a cardiology consult. Patient was started on heparin drip in the ED. Will not order repeat echocardiogram at this time is he just had one done at the beginning of the month Active Problems:   HTN (hypertension) - continue home meds   Diabetes (HCC) - sliding scale insulin with corresponding glucose checks   CAD (coronary artery disease) - continue home meds, other workup as above  All the records are reviewed and case discussed with ED provider. Management plans discussed with the patient and/or family.  DVT PROPHYLAXIS: Systemic anticoagulation  GI PROPHYLAXIS: None  ADMISSION STATUS: Observation  CODE STATUS: Full Code Status History    Date Active Date Inactive Code Status Order ID Comments User Context   03/31/2017  8:25 PM 04/03/2017  6:53 PM Full Code [id number]  Joellyn Rued, MD Inpatient   03/13/2017  1:41 AM 03/14/2017  3:32 PM Full Code [id number]  Oralia Manis, MD Inpatient   02/24/2017  4:20 AM 02/27/2017  2:10 PM Full Code [id number]  Arnaldo Natal, MD Inpatient      TOTAL TIME TAKING CARE OF THIS PATIENT: 40 minutes.   Airelle Everding FIELDING 04/23/2017, 10:46 PM  [facility]  Office  [phone]  CC: Primary care physician; Patient, No Pcp Per  Note:  This document was prepared using Dragon voice recognition software and may include unintentional dictation errors.

## 2017-04-23 NOTE — ED Triage Notes (Signed)
Pt bib [facility] from home d/t sudden onset central to left sided chest pain radiating to left arm and back. Pt took 4 aspirin and 2 nitro PTA of EMS and pain resolved. Pt A&O and states feeling anxious upon arrival to ED, VSS, in NAD, in rate controlled afib.

## 2017-04-23 NOTE — Telephone Encounter (Signed)
Returned page. Patient's brother answered, EMS was on scene. Patient having diaphoresis and chest pain. They are transporting to the ED.

## 2017-04-23 NOTE — ED Provider Notes (Signed)
[facility] Emergency Department Provider Note ____________________________________________   First MD Initiated Contact with Patient 04/23/17 2020     (approximate)  I have reviewed the triage vital signs and the nursing notes.   HISTORY  Chief Complaint Chest Pain    HPI Christopher Amos Sr. is a 66 y.o. male with history of CAD status post PCI who presents with chest pain, acute onset approximately an hour ago, described as sharp, substernal, and nonradiating.  Patient took 2 nitroglycerin, and then the pain improved. He subsequently took 4 baby aspirin. Patient states the pain is now is resolved, but he feels a "swirling" feeling in his chest. He denies shortness of breath, nausea or vomiting, or lightheadedness.     Past Medical History:  Diagnosis Date  . Acute systolic heart failure (HCC)   . CAD (coronary artery disease)    10/18 ISR to the p/d LCX, and ostium of diag treated with cutting balloon. EF 35%  . Colitis   . Diabetes mellitus without complication (HCC)   . Diverticulitis   . Hyperlipidemia   . Hypertension     Patient Active Problem List   Diagnosis Date Noted  . Acute systolic heart failure (HCC) 04/03/2017  . H/O prosthetic aortic valve replacement 04/01/2017  . Unstable angina (HCC) 03/31/2017  . BRBPR (bright red blood per rectum) 03/12/2017  . HTN (hypertension) 03/12/2017  . Diabetes (HCC) 03/12/2017  . CAD (coronary artery disease) 03/12/2017  . Chest pain 02/24/2017  . NSTEMI (non-ST elevated myocardial infarction) (HCC) 02/24/2017    Past Surgical History:  Procedure Laterality Date  . AORTIC VALVE REPLACEMENT    . CORONARY ARTERY BYPASS GRAFT    . CORONARY BALLOON ANGIOPLASTY N/A 04/02/2017   Procedure: CORONARY BALLOON ANGIOPLASTY;  Surgeon: Lennette Bihari, MD;  Location: [facility] INVASIVE CV LAB;  Service: Cardiovascular;  Laterality: N/A;  . CORONARY STENT INTERVENTION N/A 02/26/2017   Procedure: CORONARY STENT  INTERVENTION;  Surgeon: Iran Ouch, MD;  Location: [facility] INVASIVE CV LAB;  Service: Cardiovascular;  Laterality: N/A;  . CORONARY/GRAFT ANGIOGRAPHY N/A 02/26/2017   Procedure: CORONARY/GRAFT ANGIOGRAPHY;  Surgeon: Iran Ouch, MD;  Location: [facility] INVASIVE CV LAB;  Service: Cardiovascular;  Laterality: N/A;  . CORONARY/GRAFT ANGIOGRAPHY N/A 04/02/2017   Procedure: CORONARY/GRAFT ANGIOGRAPHY;  Surgeon: Lennette Bihari, MD;  Location: [facility] INVASIVE CV LAB;  Service: Cardiovascular;  Laterality: N/A;  . JOINT REPLACEMENT Left    KNEE  . KNEE ARTHROPLASTY  2000    Prior to Admission medications   Medication Sig Start Date End Date Taking? Authorizing Provider  aspirin EC 81 MG tablet Take 162 mg by mouth daily.     [provider]  atorvastatin (LIPITOR) 80 MG tablet Take 80 mg by mouth at bedtime.     [provider]  clopidogrel (PLAVIX) 75 MG tablet Take 75 mg by mouth daily.    [provider]  lisinopril (PRINIVIL,ZESTRIL) 10 MG tablet Take 10 mg by mouth at bedtime. 02/20/17   [provider]  meclizine (ANTIVERT) 25 MG tablet Take 25 mg by mouth 3 (three) times daily as needed for dizziness.    [provider]  metFORMIN (GLUCOPHAGE-XR) 500 MG 24 hr tablet Take 500 mg by mouth. 03/22/17 03/22/18  [provider]  metoprolol succinate (TOPROL-XL) 100 MG 24 hr tablet Take 1 tablet (100 mg total) by mouth daily. Take with or immediately following a meal. 04/16/17 07/15/17  Iran Ouch, MD  nitroGLYCERIN (NITROSTAT) 0.4  MG SL tablet Place 1 tablet (0.4 mg total) under the tongue every 5 (five) minutes as needed for chest pain. 04/16/17   Iran OuchArida, Muhammad A, MD  pantoprazole (PROTONIX) 40 MG tablet Take 40 mg by mouth daily.    [provider]    Allergies Metformin and related  Family History  Problem Relation Age of Onset  . CAD Mother   . Heart failure Mother   . Lupus Mother   . CAD Brother   . Heart attack  Brother   . Prostate cancer Father   . Diabetes Sister     Social History Social History  Substance Use Topics  . Smoking status: Never Smoker  . Smokeless tobacco: Never Used  . Alcohol use No     Comment: No Alcohol 12 years    Review of Systems  Constitutional: No fever. Eyes: No visual changes. ENT: No sore throat. Cardiovascular: Positive for resolved chest pain. Respiratory: Denies shortness of breath. Gastrointestinal: No nausea, no vomiting.   Genitourinary: Negative for flank pain.  Musculoskeletal: Negative for back pain. Skin: Negative for rash. Neurological: Negative for headache or lightheadedness.    ____________________________________________   PHYSICAL EXAM:  VITAL SIGNS: ED Triage Vitals  Enc Vitals Group     BP 04/23/17 2012 (!) 157/78     Pulse Rate 04/23/17 2012 (!) 106     Resp 04/23/17 2012 (!) 25     Temp 04/23/17 2012 98.3 F (36.8 C)     Temp Source 04/23/17 2012 Oral     SpO2 04/23/17 2012 98 %     Weight 04/23/17 2014 230 lb (104.3 kg)     Height 04/23/17 2014 5' 10.5" (1.791 m)     Head Circumference --      Peak Flow --      Pain Score 04/23/17 2010 0     Pain Loc --      Pain Edu? --      Excl. in GC? --     Constitutional: Alert and oriented. Slightly uncomfortable appearing but in no acute distress. Eyes: Conjunctivae are normal.  Head: Atraumatic. Nose: No congestion/rhinnorhea. Mouth/Throat: Mucous membranes are moist.   Neck: Normal range of motion.  Cardiovascular: Normal rate, regular rhythm. Grossly normal heart sounds.  Good peripheral circulation. Respiratory: Normal respiratory effort.  No retractions. Lungs CTAB. Gastrointestinal: Soft and nontender. No distention.  Genitourinary: No CVA tenderness. Musculoskeletal: No lower extremity edema.  Extremities warm and well perfused.  Neurologic:  Normal speech and language. No gross focal neurologic deficits are appreciated.  Skin:  Skin is warm and dry. No rash  noted. Psychiatric: Mood and affect are normal. Speech and behavior are normal.  ____________________________________________   LABS (all labs ordered are listed, but only abnormal results are displayed)  Labs Reviewed  BASIC METABOLIC PANEL - Abnormal; Notable for the following:       Result Value   Glucose, Bld 302 (*)    All other components within normal limits  CBC - Abnormal; Notable for the following:    HCT 37.4 (*)    All other components within normal limits  TROPONIN I - Abnormal; Notable for the following:    Troponin I 0.05 (*)    All other components within normal limits  BRAIN NATRIURETIC PEPTIDE - Abnormal; Notable for the following:    B Natriuretic Peptide 158.0 (*)    All other components within normal limits   ____________________________________________  EKG  ED ECG REPORT I, Dionne BucySebastian Skyleigh Windle,  the attending physician, personally viewed and interpreted this ECG.  Date: 04/23/2017 EKG Time: 2009 Rate: 101 Rhythm: atrial fibrillation QRS Axis: normal Intervals: normal ST/T Wave abnormalities: nonspecific T-wave abnormalities, lateral leads Narrative Interpretation: no evidence of acute ischemia; No significant change when compared to EKG of 04/16/2017  ____________________________________________  RADIOLOGY  CXR: Pulmonary vascular congestion, no frank edema  ____________________________________________   PROCEDURES  Procedure(s) performed: No    Critical Care performed: No ____________________________________________   INITIAL IMPRESSION / ASSESSMENT AND PLAN / ED COURSE  Pertinent labs & imaging results that were available during my care of the patient were reviewed by me and considered in my medical decision making (see chart for details).  66 year old male with history of CAD and atrial fibrillation, and s/p recent PCI, presents with acute onset of sharp chest pain, now resolved after nitroglycerin and aspirin.  Review of past  medical records in Epic is significant for an admission from 9/29 until 10/2 for chest pain and ACS, and catheterization on 10/1 showing CAD in multiple vessels and status post multiple balloon interventions and stents.  On exam, patient is slightly uncomfortable appearing but in no distress; vital signs are normal except for hypertension (tachycardic at triage but not by the time of my examination) and there are no other significant findings.  Primary concern on differential is whether patient is having ongoing ACS or active ischemia. Also consider development of CHF. There is no clinical evidence for DVT or PE, or for aortic dissection or other vascular cause.  Plan: basic labs, cardiac enzymes, CXR and consult cardiology on recs for further management.  If patient remains asymptomatic and enzymes are negative x2, consider d/c home with close followup if we can discuss with cardiology.     ----------------------------------------- 9:59 PM on 04/23/2017 -----------------------------------------  Initial troponin slightly elevated. Patient has had no recurrence of chest pain, butstill feels discomfort and feels somewhat weak.  I discussed case with Dr. Gala Romney from [facility] cardiology, who recommends heparin drip and admission for r/o ACS.   ____________________________________________   FINAL CLINICAL IMPRESSION(S) / ED DIAGNOSES  Final diagnoses:  Chest pain, unspecified type      NEW MEDICATIONS STARTED DURING THIS VISIT:  New Prescriptions   No medications on file     Note:  This document was prepared using Dragon voice recognition software and may include unintentional dictation errors.    Dionne Bucy, MD 04/23/17 2201

## 2017-04-23 NOTE — ED Notes (Signed)
Test: TROP  Critical Value: 0.05  Name of Provider Notified: DR Marisa SeverinSIADECKI

## 2017-04-23 NOTE — Progress Notes (Signed)
ANTICOAGULATION CONSULT NOTE - Initial Consult  Pharmacy Consult for Heparin Drip Indication: chest pain/ACS  Allergies  Allergen Reactions  . Metformin And Related Other (See Comments)    Only the regular Metformin    Patient Measurements: Height: 5' 10.5" (179.1 cm) Weight: 230 lb (104.3 kg) IBW/kg (Calculated) : 74.15 Heparin Dosing Weight: 96.17 kg  Vital Signs: Temp: 98.3 F (36.8 C) (10/22 2012) Temp Source: Oral (10/22 2012) BP: 141/96 (10/22 2100) Pulse Rate: 68 (10/22 2100)  Labs:  Recent Labs  04/23/17 2011  HGB 13.0  HCT 37.4*  PLT 222  CREATININE 0.82  TROPONINI 0.05*    Estimated Creatinine Clearance: 111 mL/min (by C-G formula based on SCr of 0.82 mg/dL).   Medical History: Past Medical History:  Diagnosis Date  . Acute systolic heart failure (HCC)   . CAD (coronary artery disease)    10/18 ISR to the p/d LCX, and ostium of diag treated with cutting balloon. EF 35%  . Colitis   . Diabetes mellitus without complication (HCC)   . Diverticulitis   . Hyperlipidemia   . Hypertension     Medications:  Scheduled:  . heparin  4,000 Units Intravenous Once   Infusions:  . heparin      Assessment: 66 yo male ordered heparin drip for chest pain.   Goal of Therapy:  Heparin level 0.3-0.7 units/ml Monitor platelets by anticoagulation protocol: Yes   Plan:  Give 4000 units bolus x 1 Start heparin infusion at 1250 units/hr Check anti-Xa level in 6 hours and daily while on heparin Continue to monitor H&H and platelets  Allyah Heather K, RPH 04/23/2017,10:08 PM

## 2017-04-24 ENCOUNTER — Observation Stay (HOSPITAL_BASED_OUTPATIENT_CLINIC_OR_DEPARTMENT_OTHER): Payer: BLUE CROSS/BLUE SHIELD

## 2017-04-24 DIAGNOSIS — I10 Essential (primary) hypertension: Secondary | ICD-10-CM

## 2017-04-24 DIAGNOSIS — I481 Persistent atrial fibrillation: Secondary | ICD-10-CM

## 2017-04-24 DIAGNOSIS — I5022 Chronic systolic (congestive) heart failure: Secondary | ICD-10-CM | POA: Diagnosis not present

## 2017-04-24 DIAGNOSIS — I248 Other forms of acute ischemic heart disease: Secondary | ICD-10-CM | POA: Diagnosis not present

## 2017-04-24 DIAGNOSIS — I2510 Atherosclerotic heart disease of native coronary artery without angina pectoris: Secondary | ICD-10-CM

## 2017-04-24 LAB — NM MYOCAR MULTI W/SPECT W/WALL MOTION / EF
CHL CUP STRESS STAGE 1 GRADE: 0 %
CHL CUP STRESS STAGE 1 HR: 85 {beats}/min
CHL CUP STRESS STAGE 2 GRADE: 0 %
CHL CUP STRESS STAGE 3 GRADE: 0 %
CHL CUP STRESS STAGE 4 GRADE: 0 %
CHL CUP STRESS STAGE 4 HR: 113 {beats}/min
CHL CUP STRESS STAGE 4 SPEED: 0 mph
CHL CUP STRESS STAGE 5 HR: 113 {beats}/min
CSEPPMHR: 60 %
Estimated workload: 1 METS
LV sys vol: 48 mL
LVDIAVOL: 82 mL (ref 62–150)
NUC STRESS TID: 0.96
Peak HR: 94 {beats}/min
Percent HR: 72 %
Rest HR: 84 {beats}/min
Stage 1 Speed: 0 mph
Stage 2 HR: 84 {beats}/min
Stage 2 Speed: 0 mph
Stage 3 HR: 94 {beats}/min
Stage 3 Speed: 0 mph
Stage 5 DBP: 67 mmHg
Stage 5 Grade: 0 %
Stage 5 SBP: 108 mmHg
Stage 5 Speed: 0 mph

## 2017-04-24 LAB — CBC
HCT: 38.1 % — ABNORMAL LOW (ref 40.0–52.0)
Hemoglobin: 12.7 g/dL — ABNORMAL LOW (ref 13.0–18.0)
MCH: 27.3 pg (ref 26.0–34.0)
MCHC: 33.4 g/dL (ref 32.0–36.0)
MCV: 81.8 fL (ref 80.0–100.0)
Platelets: 186 10*3/uL (ref 150–440)
RBC: 4.66 MIL/uL (ref 4.40–5.90)
RDW: 14.4 % (ref 11.5–14.5)
WBC: 8 10*3/uL (ref 3.8–10.6)

## 2017-04-24 LAB — BASIC METABOLIC PANEL
Anion gap: 8 (ref 5–15)
BUN: 13 mg/dL (ref 6–20)
CALCIUM: 8.9 mg/dL (ref 8.9–10.3)
CO2: 28 mmol/L (ref 22–32)
CREATININE: 0.71 mg/dL (ref 0.61–1.24)
Chloride: 103 mmol/L (ref 101–111)
GFR calc Af Amer: >60 mL/min (ref >60)
GLUCOSE: 155 mg/dL — AB (ref 65–99)
Potassium: 3.1 mmol/L — ABNORMAL LOW (ref 3.5–5.1)
Sodium: 139 mmol/L (ref 135–145)

## 2017-04-24 LAB — TROPONIN I
TROPONIN I: 0.07 ng/mL — AB (ref <0.03)
TROPONIN I: 0.07 ng/mL — AB (ref <0.03)

## 2017-04-24 LAB — GLUCOSE, CAPILLARY
GLUCOSE-CAPILLARY: 148 mg/dL — AB (ref 65–99)
GLUCOSE-CAPILLARY: 151 mg/dL — AB (ref 65–99)
GLUCOSE-CAPILLARY: 157 mg/dL — AB (ref 65–99)
GLUCOSE-CAPILLARY: 174 mg/dL — AB (ref 65–99)
Glucose-Capillary: 178 mg/dL — ABNORMAL HIGH (ref 65–99)

## 2017-04-24 LAB — HEPARIN LEVEL (UNFRACTIONATED)
Heparin Unfractionated: 0.31 IU/mL (ref 0.30–0.70)
Heparin Unfractionated: 0.26 IU/mL — ABNORMAL LOW (ref 0.30–0.70)

## 2017-04-24 LAB — MAGNESIUM: Magnesium: 1.8 mg/dL (ref 1.7–2.4)

## 2017-04-24 MED ORDER — POTASSIUM CHLORIDE CRYS ER 20 MEQ PO TBCR
40.0000 meq | EXTENDED_RELEASE_TABLET | Freq: Two times a day (BID) | ORAL | Status: AC
  Administered 2017-04-24 (×2): 40 meq via ORAL
  Filled 2017-04-24 (×2): qty 2

## 2017-04-24 MED ORDER — RANOLAZINE ER 500 MG PO TB12
500.0000 mg | ORAL_TABLET | Freq: Two times a day (BID) | ORAL | Status: DC
  Administered 2017-04-24 – 2017-04-26 (×4): 500 mg via ORAL
  Filled 2017-04-24 (×5): qty 1

## 2017-04-24 MED ORDER — INSULIN ASPART 100 UNIT/ML ~~LOC~~ SOLN
0.0000 [IU] | Freq: Four times a day (QID) | SUBCUTANEOUS | Status: DC
  Administered 2017-04-24: 1 [IU] via SUBCUTANEOUS
  Administered 2017-04-24 (×4): 2 [IU] via SUBCUTANEOUS
  Administered 2017-04-25: 3 [IU] via SUBCUTANEOUS
  Administered 2017-04-25: 2 [IU] via SUBCUTANEOUS
  Administered 2017-04-25: 1 [IU] via SUBCUTANEOUS
  Filled 2017-04-24 (×8): qty 1

## 2017-04-24 MED ORDER — NITROGLYCERIN 0.4 MG SL SUBL
0.4000 mg | SUBLINGUAL_TABLET | SUBLINGUAL | Status: AC | PRN
  Administered 2017-04-24 (×3): 0.4 mg via SUBLINGUAL
  Filled 2017-04-24 (×2): qty 1

## 2017-04-24 MED ORDER — ONDANSETRON HCL 4 MG PO TABS: 4.0000 mg | ORAL_TABLET | Freq: Four times a day (QID) | ORAL | Status: DC | PRN

## 2017-04-24 MED ORDER — ASPIRIN EC 81 MG PO TBEC
162.0000 mg | DELAYED_RELEASE_TABLET | Freq: Every day | ORAL | Status: DC
  Administered 2017-04-24: 162 mg via ORAL
  Filled 2017-04-24: qty 2

## 2017-04-24 MED ORDER — REGADENOSON 0.4 MG/5ML IV SOLN
0.4000 mg | Freq: Once | INTRAVENOUS | Status: AC
  Administered 2017-04-24: 0.4 mg via INTRAVENOUS
  Filled 2017-04-24: qty 5

## 2017-04-24 MED ORDER — METOPROLOL TARTRATE 25 MG PO TABS
25.0000 mg | ORAL_TABLET | Freq: Once | ORAL | Status: AC
  Administered 2017-04-24: 25 mg via ORAL
  Filled 2017-04-24: qty 1

## 2017-04-24 MED ORDER — ONDANSETRON HCL 4 MG/2ML IJ SOLN: 4.0000 mg | Freq: Four times a day (QID) | INTRAMUSCULAR | Status: DC | PRN

## 2017-04-24 MED ORDER — TECHNETIUM TC 99M TETROFOSMIN IV KIT
13.0000 | PACK | Freq: Once | INTRAVENOUS | Status: AC | PRN
  Administered 2017-04-24: 13.388 via INTRAVENOUS

## 2017-04-24 MED ORDER — METOPROLOL SUCCINATE ER 50 MG PO TB24
150.0000 mg | ORAL_TABLET | Freq: Every day | ORAL | Status: DC
  Administered 2017-04-25 – 2017-04-26 (×2): 150 mg via ORAL
  Filled 2017-04-24 (×2): qty 1

## 2017-04-24 MED ORDER — CLOPIDOGREL BISULFATE 75 MG PO TABS
75.0000 mg | ORAL_TABLET | Freq: Every day | ORAL | Status: DC
  Administered 2017-04-24 – 2017-04-26 (×3): 75 mg via ORAL
  Filled 2017-04-24 (×3): qty 1

## 2017-04-24 MED ORDER — ACETAMINOPHEN 325 MG PO TABS
650.0000 mg | ORAL_TABLET | Freq: Four times a day (QID) | ORAL | Status: DC | PRN
  Administered 2017-04-25: 650 mg via ORAL
  Filled 2017-04-24: qty 2

## 2017-04-24 MED ORDER — PANTOPRAZOLE SODIUM 40 MG PO TBEC
40.0000 mg | DELAYED_RELEASE_TABLET | Freq: Every day | ORAL | Status: DC
  Administered 2017-04-24 – 2017-04-26 (×3): 40 mg via ORAL
  Filled 2017-04-24 (×3): qty 1

## 2017-04-24 MED ORDER — OXYCODONE HCL 5 MG PO TABS
5.0000 mg | ORAL_TABLET | ORAL | Status: DC | PRN
  Administered 2017-04-24: 5 mg via ORAL
  Filled 2017-04-24: qty 1

## 2017-04-24 MED ORDER — TECHNETIUM TC 99M TETROFOSMIN IV KIT
30.9610 | PACK | Freq: Once | INTRAVENOUS | Status: AC | PRN
  Administered 2017-04-24: 30.961 via INTRAVENOUS

## 2017-04-24 MED ORDER — ATORVASTATIN CALCIUM 20 MG PO TABS
80.0000 mg | ORAL_TABLET | Freq: Every day | ORAL | Status: DC
Start: 2017-04-24 — End: 2017-04-26
  Administered 2017-04-24 – 2017-04-25 (×3): 80 mg via ORAL
  Filled 2017-04-24 (×3): qty 4

## 2017-04-24 MED ORDER — LISINOPRIL 10 MG PO TABS
10.0000 mg | ORAL_TABLET | Freq: Every day | ORAL | Status: DC
  Administered 2017-04-24 – 2017-04-25 (×3): 10 mg via ORAL
  Filled 2017-04-24 (×3): qty 1

## 2017-04-24 MED ORDER — HEPARIN SODIUM (PORCINE) 5000 UNIT/ML IJ SOLN
5000.0000 [IU] | Freq: Three times a day (TID) | INTRAMUSCULAR | Status: DC
  Administered 2017-04-25 – 2017-04-26 (×3): 5000 [IU] via SUBCUTANEOUS
  Filled 2017-04-24 (×4): qty 1

## 2017-04-24 MED ORDER — METOPROLOL SUCCINATE ER 100 MG PO TB24
100.0000 mg | ORAL_TABLET | Freq: Every day | ORAL | Status: DC
  Filled 2017-04-24: qty 1

## 2017-04-24 MED ORDER — ACETAMINOPHEN 650 MG RE SUPP: 650.0000 mg | Freq: Four times a day (QID) | RECTAL | Status: DC | PRN

## 2017-04-24 NOTE — Progress Notes (Signed)
ANTICOAGULATION CONSULT NOTE - Initial Consult  Pharmacy Consult for Heparin Drip Indication: chest pain/ACS  Allergies  Allergen Reactions  . Metformin And Related Other (See Comments)    Only the regular Metformin    Patient Measurements: Height: 5' 10.5" (179.1 cm) Weight: 232 lb 4.8 oz (105.4 kg) IBW/kg (Calculated) : 74.15 Heparin Dosing Weight: 96.17 kg  Vital Signs: Temp: 98.1 F (36.7 C) (10/23 0814) Temp Source: Oral (10/23 0814) BP: 115/53 (10/23 0814) Pulse Rate: 37 (10/23 0814)  Labs:  Recent Labs  04/23/17 2011 04/24/17 0249 04/24/17 0605  HGB 13.0 12.7*  --   HCT 37.4* 38.1*  --   PLT 222 186  --   APTT 30  --   --   LABPROT 14.9  --   --   INR 1.18  --   --   HEPARINUNFRC  --   --  0.31  CREATININE 0.82 0.71  --   TROPONINI 0.05* 0.07*  --     Estimated Creatinine Clearance: 114.4 mL/min (by C-G formula based on SCr of 0.71 mg/dL).   Medical History: Past Medical History:  Diagnosis Date  . Acute systolic heart failure (HCC)   . CAD (coronary artery disease)    10/18 ISR to the p/d LCX, and ostium of diag treated with cutting balloon. EF 35%  . Colitis   . Diabetes mellitus without complication (HCC)   . Diverticulitis   . Hyperlipidemia   . Hypertension     Medications:  Scheduled:  . aspirin EC  162 mg Oral Daily  . atorvastatin  80 mg Oral QHS  . clopidogrel  75 mg Oral Daily  . insulin aspart  0-9 Units Subcutaneous Q6H  . lisinopril  10 mg Oral QHS  . metoprolol succinate  100 mg Oral Daily  . pantoprazole  40 mg Oral Daily  . potassium chloride  40 mEq Oral BID   Infusions:  . heparin 1,250 Units/hr (04/23/17 2219)    Assessment: 66 yo male ordered heparin drip for chest pain.   Goal of Therapy:  Heparin level 0.3-0.7 units/ml Monitor platelets by anticoagulation protocol: Yes   Plan:  Give 4000 units bolus x 1 Start heparin infusion at 1250 units/hr Check anti-Xa level in 6 hours and daily while on  heparin Continue to monitor H&H and platelets   10/23 0605 HL 0.31 is just barely therapeutic. Will increase infusion to 1300u/hr and recheck confirmatory level in 6 hours.   Gardner CandleSheema M Ashani Pumphrey, PharmD, BCPS Clinical Pharmacist 04/24/2017 8:21 AM

## 2017-04-24 NOTE — Progress Notes (Signed)
[facility] - [place] at [facility]   PATIENT NAME: Christopher Cummings    MR#:  [id number]  DATE OF BIRTH:  [birth year]  SUBJECTIVE:  CHIEF COMPLAINT:  Pt is reporting chest pressure ,had stress test today  REVIEW OF SYSTEMS:  CONSTITUTIONAL: No fever, fatigue or weakness.  EYES: No blurred or double vision.  EARS, NOSE, AND THROAT: No tinnitus or ear pain.  RESPIRATORY: No cough, shortness of breath, wheezing or hemoptysis.  CARDIOVASCULAR: reporting chest pressure, denies orthopnea, edema.  GASTROINTESTINAL: No nausea, vomiting, diarrhea or abdominal pain.  GENITOURINARY: No dysuria, hematuria.  ENDOCRINE: No polyuria, nocturia,  HEMATOLOGY: No anemia, easy bruising or bleeding SKIN: No rash or lesion. MUSCULOSKELETAL: No joint pain or arthritis.   NEUROLOGIC: No tingling, numbness, weakness.  PSYCHIATRY: No anxiety or depression.   DRUG ALLERGIES:   Allergies  Allergen Reactions  . Metformin And Related Other (See Comments)    Only the regular Metformin    VITALS:  Blood pressure 128/80, pulse (!) 106, temperature 98.1 F (36.7 C), temperature source Oral, resp. rate 18, height 5' 10.5" (1.791 m), weight 105.4 kg (232 lb 4.8 oz), SpO2 91 %.  PHYSICAL EXAMINATION:  GENERAL:  66 y.o.-year-old patient lying in the bed with no acute distress.  EYES: Pupils equal, round, reactive to light and accommodation. No scleral icterus. Extraocular muscles intact.  HEENT: Head atraumatic, normocephalic. Oropharynx and nasopharynx clear.  NECK:  Supple, no jugular venous distention. No thyroid enlargement, no tenderness.  LUNGS: Normal breath sounds bilaterally, no wheezing, rales,rhonchi or crepitation. No use of accessory muscles of respiration.  CARDIOVASCULAR: S1, S2 normal. No murmurs, rubs, or gallops.  ABDOMEN: Soft, nontender, nondistended. Bowel sounds present. No organomegaly or mass.  EXTREMITIES: No pedal edema, cyanosis, or clubbing.  NEUROLOGIC:  Cranial nerves II through XII are intact. Muscle strength 5/5 in all extremities. Sensation intact. Gait not checked.  PSYCHIATRIC: The patient is alert and oriented x 3.  SKIN: No obvious rash, lesion, or ulcer.    LABORATORY PANEL:   CBC  Recent Labs Lab 04/24/17 0249  WBC 8.0  HGB 12.7*  HCT 38.1*  PLT 186   ------------------------------------------------------------------------------------------------------------------  Chemistries   Recent Labs Lab 04/24/17 0249  NA 139  K 3.1*  CL 103  CO2 28  GLUCOSE 155*  BUN 13  CREATININE 0.71  CALCIUM 8.9  MG 1.8   ------------------------------------------------------------------------------------------------------------------  Cardiac Enzymes  Recent Labs Lab 04/24/17 0813  TROPONINI 0.07*   ------------------------------------------------------------------------------------------------------------------  RADIOLOGY:  Dg Chest 2 View  Result Date: 04/23/2017 CLINICAL DATA:  66 year old male with history of central chest pain today. EXAM: CHEST  2 VIEW COMPARISON:  Chest x-ray 04/01/2015. FINDINGS: Lung volumes are slightly low. No acute consolidative airspace disease. No pleural effusions. Cephalization of the pulmonary vasculature, without frank pulmonary edema. Heart size is mildly enlarged. Upper mediastinal contours are within normal limits. Status post median sternotomy for aortic valve replacement and CABG including LIMA to the LAD. IMPRESSION: 1. Pulmonary venous congestion, without frank pulmonary edema. 2. Postoperative changes, as above. Electronically Signed   By: Trudie Reed M.D.   On: 04/23/2017 20:33    EKG:   Orders placed or performed during the hospital encounter of 04/23/17  . EKG 12-Lead  . EKG 12-Lead  . ED EKG within 10 minutes  . ED EKG within 10 minutes    ASSESSMENT AND PLAN:    Unstable angina (HCC) -  Troponin went up to 0.07 Patient had cardiac stress test today  results  are pending Plan is to continue heparin drip and possible cardiac cath  in a.m. Nothing by mouth after midnight Cardiology is considering to start him on Ranexa Plan is to continue aspirin, Plavix and Toprol Patient's isosorbide was discontinued 30 days ago because of the soft blood pressure   Persistent atrial fibrillation continue Toprol Not on long-term anticoagulation in view of his recent history of GI bleed also patient is on dual antiplatelet therapy  Ischemic cardiomyopathy with chronic systolic congestive heart failure,history of biprosthetic aortic valve replacement Not fluid overloaded at this time. Continue Toprol and lisinopril echocardiogram    HTN (hypertension) - continue metoprolol and lisinopril and titrate as needed    Diabetes (HCC) - sliding scale insulin with corresponding glucose checks     CAD (coronary artery disease) Aspirin Plavix Toprol and statin    All the records are reviewed and case discussed with Care Management/Social Workerr. Management plans discussed with the patient, and cardiology and they are in agreement.  CODE STATUS: fc  TOTAL TIME TAKING CARE OF THIS PATIENT: 36  minutes.   POSSIBLE D/C IN 2 DAYS, DEPENDING ON CLINICAL CONDITION.  Note: This dictation was prepared with Dragon dictation along with smaller phrase technology. Any transcriptional errors that result from this process are unintentional.   Ramonita LabGouru, Eviana Sibilia M.D on 04/24/2017 at 2:38 PM  Between 7am to 6pm - Pager - [phone] After 6pm go to [email or web address] - password [email or web address]  Fabio Neighborsagle [facility]  Office  [phone]  CC: Primary care physician; Patient, No Pcp Per

## 2017-04-24 NOTE — Consult Note (Signed)
Cardiology Consultation:   Patient ID: Christopher Santagata Sr.; [id number]; 28-Oct-1950   Admit date: 04/23/2017 Date of Consult: 04/24/2017  Primary Care Provider: Patient, No Pcp Per Primary Cardiologist: Kirke Corin   Patient Profile:   Christopher Amendola Sr. is a 66 y.o. male with a hx of CAD s/p CABG with LIMA-LAD, LCx stenting, and bioprosthetic aortic valve replacement in [place], [place], also with history of ICM/chronic systolic CHF, persistent Afib not on full dose anticoagulation due to recurrent and recent GI bleed, hypertension, hyperlipidemia, and diabetes who is being seen today for the evaluation of chest pain and PAF with RVR at the request of Dr. Anne Hahn, MD.  History of Present Illness:   Christopher Cummings had a non-ST segment elevation myocardial infarction in August, 2018 requiring hospitalization at [facility].  He was found to have in-stent restenosis of the left circumflex, as well as a diagonal.  The diagonal was a long segment of stents, not suitable for angioplasty, and was left to be treated weekly.  He underwent PTCA for in-stent restenosis in the proximal and distal left circumflex.  He subsequently had a lower GI bleed in September, 2018 though dual antiplatelet therapy was not interrupted.  He was admitted to [facility] in late September, 2018 with unstable angina.  He underwent cardiac catheterization which showed restenosis in the left circumflex.  He underwent repeat cutting balloon angioplasty to the left circumflex and diagonal.  The left circumflex results were optimal but the diagonal angioplasty was suboptimal with 50% residual stenosis.  Echocardiogram at that time showed an EF of 35-40% with normal functioning bioprosthetic valve, mild mitral regurgitation, and no pulmonary hypertension.  He was most recently seen in our office in mid October, 2018 and referred to cardiac rehabilitation at that time.  It was felt the diagonal artery was high risk for occlusion and  probably not worth attempting angioplasty in the future.  It was felt the left circumflex could be treated with a drug-eluting stent if he tolerated long-term dual antiplatelet therapy.  He was noted to be in A. fib with RVR at that time and his metoprolol was increased along with discontinuation of isosorbide in an effort to avoid hypotension.  It was preferred to avoid anticoagulation at that time given his recurrent GI bleed and need for dual antiplatelet therapy as above.  Patient was admitted to [facility] on 10/22 with development of chest pain that was initially sharp followed by associated sensation of "feeling like my insides were shaking."  Pain has been constant and associated with nausea and shortness of breath.  Patient lives a fairly sedentary lifestyle so it has been difficult to assess for any exertional component.  Upon his arrival to [facility] he was noted to be in A. fib with RVR with heart rates in the low 100s bpm.  Troponin minimally elevated with a peak of 0.07.  Magnesium 1.8.  White blood cell count 8.0, hemoglobin 12.7, platelet count 186.  Serum creatinine 0.71, potassium 3.1.  EKG showed A. fib as below.  Chest x-ray showed pulmonary venous congestion without frank pulmonary edema.  Blood pressure has been stable and well controlled.  He was started on heparin drip upon admission and continued on prior to admission medications.  Cardiology was asked to evaluate.  Heart rate has improved to the 70s bpm, remains in A. fib.  With improvement in his heart rate his chest pain has improved as well.  He is for [employer] today.  Past Medical History:  Diagnosis Date  . Acute systolic heart failure (HCC)   . CAD (coronary artery disease)    10/18 ISR to the p/d LCX, and ostium of diag treated with cutting balloon. EF 35%  . Colitis   . Diabetes mellitus without complication (HCC)   . Diverticulitis   . Hyperlipidemia   . Hypertension     Past Surgical  History:  Procedure Laterality Date  . AORTIC VALVE REPLACEMENT    . CORONARY ARTERY BYPASS GRAFT    . CORONARY BALLOON ANGIOPLASTY N/A 04/02/2017   Procedure: CORONARY BALLOON ANGIOPLASTY;  Surgeon: Lennette Bihari, MD;  Location: [facility] INVASIVE CV LAB;  Service: Cardiovascular;  Laterality: N/A;  . CORONARY STENT INTERVENTION N/A 02/26/2017   Procedure: CORONARY STENT INTERVENTION;  Surgeon: Iran Ouch, MD;  Location: [facility] INVASIVE CV LAB;  Service: Cardiovascular;  Laterality: N/A;  . CORONARY/GRAFT ANGIOGRAPHY N/A 02/26/2017   Procedure: CORONARY/GRAFT ANGIOGRAPHY;  Surgeon: Iran Ouch, MD;  Location: [facility] INVASIVE CV LAB;  Service: Cardiovascular;  Laterality: N/A;  . CORONARY/GRAFT ANGIOGRAPHY N/A 04/02/2017   Procedure: CORONARY/GRAFT ANGIOGRAPHY;  Surgeon: Lennette Bihari, MD;  Location: [facility] INVASIVE CV LAB;  Service: Cardiovascular;  Laterality: N/A;  . JOINT REPLACEMENT Left    KNEE  . KNEE ARTHROPLASTY  2000     Home Meds: Prior to Admission medications   Medication Sig Start Date End Date Taking? Authorizing Provider  aspirin EC 81 MG tablet Take 162 mg by mouth daily.    Yes [provider]  atorvastatin (LIPITOR) 80 MG tablet Take 80 mg by mouth at bedtime.    Yes [provider]  clopidogrel (PLAVIX) 75 MG tablet Take 75 mg by mouth daily.   Yes [provider]  lisinopril (PRINIVIL,ZESTRIL) 10 MG tablet Take 10 mg by mouth at bedtime. 02/20/17  Yes [provider]  meclizine (ANTIVERT) 25 MG tablet Take 25 mg by mouth 3 (three) times daily as needed for dizziness.   Yes [provider]  metFORMIN (GLUCOPHAGE-XR) 500 MG 24 hr tablet Take 500 mg by mouth. 03/22/17 03/22/18 Yes [provider]  metoprolol succinate (TOPROL-XL) 100 MG 24 hr tablet Take 1 tablet (100 mg total) by mouth daily. Take with or immediately following a meal. 04/16/17 07/15/17 Yes Iran Ouch, MD  nitroGLYCERIN (NITROSTAT) 0.4 MG SL tablet  Place 1 tablet (0.4 mg total) under the tongue every 5 (five) minutes as needed for chest pain. 04/16/17  Yes Iran Ouch, MD  pantoprazole (PROTONIX) 40 MG tablet Take 40 mg by mouth daily.   Yes [provider]    Inpatient Medications: Scheduled Meds: . aspirin EC  162 mg Oral Daily  . atorvastatin  80 mg Oral QHS  . clopidogrel  75 mg Oral Daily  . insulin aspart  0-9 Units Subcutaneous Q6H  . lisinopril  10 mg Oral QHS  . metoprolol succinate  100 mg Oral Daily  . pantoprazole  40 mg Oral Daily  . potassium chloride  40 mEq Oral BID   Continuous Infusions: . heparin 1,300 Units/hr (04/24/17 1237)   PRN Meds: acetaminophen **OR** acetaminophen, nitroGLYCERIN, ondansetron **OR** ondansetron (ZOFRAN) IV, oxyCODONE  Allergies:   Allergies  Allergen Reactions  . Metformin And Related Other (See Comments)    Only the regular Metformin    Social History:   Social History   Social History  . Marital status: Divorced    Spouse name: N/A  . Number of children: N/A  .  Years of education: N/A   Occupational History  . Not on file.   Social History Main Topics  . Smoking status: Never Smoker  . Smokeless tobacco: Never Used  . Alcohol use No     Comment: No Alcohol 12 years  . Drug use: No  . Sexual activity: Not on file   Other Topics Concern  . Not on file   Social History Narrative  . No narrative on file     Family History:   Family History  Problem Relation Age of Onset  . CAD Mother   . Heart failure Mother   . Lupus Mother   . CAD Brother   . Heart attack Brother   . Prostate cancer Father   . Diabetes Sister     ROS:  Review of Systems  Constitutional: Positive for malaise/fatigue. Negative for chills, diaphoresis, fever and weight loss.  HENT: Negative for congestion.   Eyes: Negative for discharge and redness.  Respiratory: Positive for shortness of breath. Negative for cough, hemoptysis, sputum production and wheezing.     Cardiovascular: Positive for chest pain and palpitations. Negative for orthopnea, claudication, leg swelling and PND.  Gastrointestinal: Negative for abdominal pain, blood in stool, heartburn, melena, nausea and vomiting.  Genitourinary: Negative for hematuria.  Musculoskeletal: Negative for falls and myalgias.  Skin: Negative for rash.  Neurological: Positive for weakness. Negative for dizziness, tingling, tremors, sensory change, speech change, focal weakness and loss of consciousness.  Endo/Heme/Allergies: Does not bruise/bleed easily.  Psychiatric/Behavioral: Negative for substance abuse. The patient is nervous/anxious.   All other systems reviewed and are negative.     Physical Exam/Data:   Vitals:   04/24/17 0335 04/24/17 0445 04/24/17 0814 04/24/17 1010  BP: (!) 104/43  (!) 115/53 128/80  Pulse: (!) 37  (!) 37 (!) 106  Resp: 19  18   Temp: (!) 97.4 F (36.3 C)  98.1 F (36.7 C)   TempSrc: Oral  Oral   SpO2: 96%  91%   Weight:  232 lb 4.8 oz (105.4 kg)    Height:        Intake/Output Summary (Last 24 hours) at 04/24/17 1315 Last data filed at 04/24/17 0700  Gross per 24 hour  Intake              100 ml  Output              300 ml  Net             -200 ml   Filed Weights   04/23/17 2014 04/24/17 0445  Weight: 230 lb (104.3 kg) 232 lb 4.8 oz (105.4 kg)   Body mass index is 32.86 kg/m.   Physical Exam: General: Well developed, well nourished, in no acute distress. Head: Normocephalic, atraumatic, sclera non-icteric, no xanthomas, nares without discharge.  Neck: Negative for carotid bruits. JVD not elevated. Lungs: Clear bilaterally to auscultation without wheezes, rales, or rhonchi. Breathing is unlabored. Heart:  Irregularly irregular with S1 S2. No murmurs, rubs, or gallops appreciated. Abdomen: Soft, non-tender, non-distended with normoactive bowel sounds. No hepatomegaly. No rebound/guarding. No obvious abdominal masses. Msk:  Strength and tone appear normal  for age. Extremities: No clubbing or cyanosis. No edema. Distal pedal pulses are 2+ and equal bilaterally. Neuro: Alert and oriented X 3. No facial asymmetry. No focal deficit. Moves all extremities spontaneously. Psych:  Responds to questions appropriately with a normal affect.   EKG:  The EKG was personally reviewed and demonstrates: Afib, 101  bpm, nonspecific lateral st/t changes Telemetry:  Telemetry was personally reviewed and demonstrates: Afib, currently with heart rates in the 70s bpm, initially Afib with RVR with heart rates in the low 100s bpm  Weights: Filed Weights   04/23/17 2014 04/24/17 0445  Weight: 230 lb (104.3 kg) 232 lb 4.8 oz (105.4 kg)    Relevant CV Studies: LHC 04/02/2017: Coronary Findings   Dominance: Right  Left Main  Vessel is angiographically normal.  Left Anterior Descending  Mid LAD lesion, 70% stenosed.  Dist LAD lesion, 80% stenosed.  First Diagonal Branch  Ost 1st Diag to 1st Diag lesion, 95% stenosed. The lesion was previously treated.  Angioplasty: Angioplasty alone was performed using a BALLOON EUPHORA RX 2.0X12.  Supplies used: BALLOON WOLVERINE 2.00X10; BALLOON WOLVERINE 2.50X10; [place] ZO1.0R60RX2.5X12  There is a 50% residual stenosis post intervention.  1st Diag lesion, 70% stenosed.  Left Circumflex  Ost Cx to Mid Cx lesion, 90% stenosed. The lesion was previously treated.  Angioplasty: Angioplasty alone was performed using a BALLOON WOLVERINE 2.50X10.  Supplies used: BALLOON WOLVERINE 3.00X10  There is a 10% residual stenosis post intervention.  Dist Cx lesion, 70% stenosed. The lesion was previously treated.  Angioplasty: A stent was successfully placed.  There is a 10% residual stenosis post intervention.  First Obtuse Marginal Branch  Vessel is small in size. There is mild disease in the vessel.  Third Obtuse Marginal Branch  Ost 3rd Mrg lesion, 90% stenosed.  Right Coronary Artery  Prox RCA lesion, 10% stenosed. The lesion  was previously treated.  Mid RCA lesion, 30% stenosed.  Right Posterior Descending Artery  Vessel is angiographically normal.  Right Posterior Atrioventricular Branch  There is mild disease in the vessel.  First Right Posterolateral  There is mild disease in the vessel.  Second Right Posterolateral  There is mild disease in the vessel.  Third Right Posterolateral  There is mild disease in the vessel.  Graft Angiography  Free LIMA Graft to Dist LAD  LIMA and is anatomically normal.  Coronary Diagrams   Diagnostic Diagram       Post-Intervention Diagram          TTE 04/02/2017: Study Conclusions  - Left ventricle: The cavity size was normal. Systolic function was   moderately reduced. The estimated ejection fraction was in the   range of 35% to 40%. Diffuse hypokinesis. The study was not   technically sufficient to allow evaluation of LV diastolic   dysfunction due to atrial fibrillation. - Aortic valve: A bioprosthesis sits well in the aortic position.   Mobility was not restricted. There was no regurgitation. Mean   gradient (S): 14 mm Hg. Peak gradient (S): 23 mm Hg. Valve area   (VTI): 1.17 cm^2. Valve area (Vmax): 1.27 cm^2. Valve area   (Vmean): 1.17 cm^2. - Mitral valve: There was mild regurgitation. - Right ventricle: The cavity size was mildly dilated. Wall   thickness was normal. Systolic function was mildly reduced. - Tricuspid valve: There was trivial regurgitation. - Pulmonary arteries: Systolic pressure was within the normal   range.  Impressions:  - When compared to the prior study from 02/25/2017 LVEF has   decreased from 50-55% to 35-40%.   Gradients across the bioprosthetic valve are normal. No aortic   regurgitation or paravalvular leak.   RV is mildly dilated with mildly decreased systolic function.   LHC 02/26/2017: Coronary Findings   Dominance: Right  Left Main  Vessel is angiographically normal.  Left  Anterior Descending  Mid LAD  lesion, 70% stenosed.  Dist LAD lesion, 100% stenosed.  First Diagonal Branch  Ost 1st Diag to 1st Diag lesion, 95% stenosed. The lesion was previously treated.  1st Diag lesion, 70% stenosed.  Left Circumflex  Prox Cx lesion, 70% stenosed. The lesion was previously treated.  Angioplasty: Lesion crossed with guidewire. Angioplasty alone was performed. The pre-interventional distal flow is normal (TIMI 3). The post-interventional distal flow is normal (TIMI 3). The intervention was successful . No complications occurred at this lesion.  There is a 15% residual stenosis post intervention.  Dist Cx lesion, 95% stenosed. The lesion was previously treated.  Angioplasty: Angioplasty alone was performed. The pre-interventional distal flow is normal (TIMI 3). The post-interventional distal flow is normal (TIMI 3). The intervention was successful . No complications occurred at this lesion.  There is a 30% residual stenosis post intervention.  First Obtuse Marginal Branch  Vessel is small in size. There is mild disease in the vessel.  Third Obtuse Marginal Branch  Ost 3rd Mrg lesion, 90% stenosed.  Right Coronary Artery  Prox RCA lesion, 10% stenosed. The lesion was previously treated.  Mid RCA lesion, 30% stenosed.  Right Posterior Descending Artery  Vessel is angiographically normal.  Right Posterior Atrioventricular Branch  There is mild disease in the vessel.  First Right Posterolateral  There is mild disease in the vessel.  Second Right Posterolateral  There is mild disease in the vessel.  Third Right Posterolateral  There is mild disease in the vessel.  Graft Angiography  Free LIMA Graft to Dist LAD  LIMA and is anatomically normal.  Coronary Diagrams   Diagnostic Diagram       Post-Intervention Diagram          Laboratory Data:  Chemistry Recent Labs Lab 04/23/17 2011 04/24/17 0249  NA 136 139  K 3.6 3.1*  CL 101 103  CO2 24 28  GLUCOSE 302* 155*  BUN 13 13    CREATININE 0.82 0.71  CALCIUM 9.3 8.9  GFRNONAA >60 >60  GFRAA >60 >60  ANIONGAP 11 8    No results for input(s): PROT, ALBUMIN, AST, ALT, ALKPHOS, BILITOT in the last 168 hours. Hematology Recent Labs Lab 04/23/17 2011 04/24/17 0249  WBC 8.5 8.0  RBC 4.63 4.66  HGB 13.0 12.7*  HCT 37.4* 38.1*  MCV 80.8 81.8  MCH 28.1 27.3  MCHC 34.8 33.4  RDW 14.5 14.4  PLT 222 186   Cardiac Enzymes Recent Labs Lab 04/23/17 2011 04/24/17 0249 04/24/17 0813  TROPONINI 0.05* 0.07* 0.07*   No results for input(s): TROPIPOC in the last 168 hours.  BNP Recent Labs Lab 04/23/17 2011  BNP 158.0*    DDimer No results for input(s): DDIMER in the last 168 hours.  Radiology/Studies:  Dg Chest 2 View  Result Date: 04/23/2017 IMPRESSION: 1. Pulmonary venous congestion, without frank pulmonary edema. 2. Postoperative changes, as above. Electronically Signed   By: Trudie Reed M.D.   On: 04/23/2017 20:33    Assessment and Plan:   1.  Chest pain/elevated troponin/CAD as above: -Pain seems to be related to tachycardic heart rates in the setting of known coronary artery disease -Elevated troponin and symptoms likely consistent with supply demand ischemia in the setting of known coronary artery disease -Troponin minimally elevated at 0.07 and flat trending -Continue heparin drip for now -[employer] today pending -If images show perfusion defect along the left circumflex territory could consider repeat cardiac catheterization with possible stenting  though this would commit the patient to a long-term full dual antiplatelet therapy -Would favor medical management including rate and rhythm control as this seems to be exacerbating his symptoms -Blood pressure does not allow for restarting of isosorbide at this time -Consider starting ranolazine -Continue Toprol -Continue ASA and Plavix  2.  Persistent A. Fib: -Rates improved -Continue Toprol -Not on long-term, full dose  anticoagulation given recurrent and recent GI bleed as well as in the setting of dual antiplatelet therapy as above -Could consider rhythm control in an effort to decrease his A. fib burden as he appears to be fairly symptomatic from this given his underlying coronary artery disease.  Unfortunately, he is a bit young for amiodarone and is not a flecainide candidate.  He may benefit from EP evaluation  3.  ICM/chronic systolic CHF: -He does not appear grossly volume overloaded -Continue Toprol and lisinopril -Consider addition of spironolactone as an outpatient -Recent echocardiogram as above, no need to repeat at this time -CHF education  4.  History of bioprosthetic aortic valve replacement: -Stable by recent echo earlier this month -Outpatient follow-up  5.  HTN: -Stable -Continue PTA medications   For questions or updates, please contact [facility] Please consult [email or web address] for contact info under Cardiology/STEMI.   Signed, Eula Listen, PA-C [facility] Pager: [phone] 04/24/2017, 1:15 PM

## 2017-04-24 NOTE — Plan of Care (Signed)
Problem: Pain Managment: Goal: General experience of comfort will improve Outcome: Progressing No complaints of chest pain since arrival to floor.

## 2017-04-24 NOTE — Progress Notes (Signed)
ANTICOAGULATION CONSULT NOTE - Initial Consult  Pharmacy Consult for Heparin Drip Indication: chest pain/ACS  Allergies  Allergen Reactions  . Metformin And Related Other (See Comments)    Only the regular Metformin    Patient Measurements: Height: 5' 10.5" (179.1 cm) Weight: 232 lb 4.8 oz (105.4 kg) IBW/kg (Calculated) : 74.15 Heparin Dosing Weight: 96.17 kg  Vital Signs: Temp: 98.1 F (36.7 C) (10/23 0814) Temp Source: Oral (10/23 0814) BP: 128/80 (10/23 1010) Pulse Rate: 106 (10/23 1010)  Labs:  Recent Labs  04/23/17 2011 04/24/17 0249 04/24/17 0605 04/24/17 0813 04/24/17 1432  HGB 13.0 12.7*  --   --   --   HCT 37.4* 38.1*  --   --   --   PLT 222 186  --   --   --   APTT 30  --   --   --   --   LABPROT 14.9  --   --   --   --   INR 1.18  --   --   --   --   HEPARINUNFRC  --   --  0.31  --  0.26*  CREATININE 0.82 0.71  --   --   --   TROPONINI 0.05* 0.07*  --  0.07*  --     Estimated Creatinine Clearance: 114.4 mL/min (by C-G formula based on SCr of 0.71 mg/dL).   Medical History: Past Medical History:  Diagnosis Date  . Acute systolic heart failure (HCC)   . CAD (coronary artery disease)    10/18 ISR to the p/d LCX, and ostium of diag treated with cutting balloon. EF 35%  . Colitis   . Diabetes mellitus without complication (HCC)   . Diverticulitis   . Hyperlipidemia   . Hypertension     Medications:  Scheduled:  . aspirin EC  162 mg Oral Daily  . atorvastatin  80 mg Oral QHS  . clopidogrel  75 mg Oral Daily  . insulin aspart  0-9 Units Subcutaneous Q6H  . lisinopril  10 mg Oral QHS  . metoprolol succinate  100 mg Oral Daily  . pantoprazole  40 mg Oral Daily  . potassium chloride  40 mEq Oral BID   Infusions:  . heparin 1,300 Units/hr (04/24/17 1237)    Assessment: 66 yo male ordered heparin drip for chest pain.   Goal of Therapy:  Heparin level 0.3-0.7 units/ml Monitor platelets by anticoagulation protocol: Yes   Plan:  Give 4000  units bolus x 1 Start heparin infusion at 1250 units/hr Check anti-Xa level in 6 hours and daily while on heparin Continue to monitor H&H and platelets   10/23 0605 HL 0.31 is just barely therapeutic. Will increase infusion to 1300u/hr and recheck confirmatory level in 6 hours.   10/23 1432 HL 0.26 is subtherapeutic. Will increase infusion to 1500 units/hr and recheck HL in 6 hour. CBC ordered with AM labs per protocol.   Gardner CandleSheema M Ryleigh Buenger, PharmD, BCPS Clinical Pharmacist 04/24/2017 3:22 PM

## 2017-04-24 NOTE — ED Notes (Signed)
1 Bag pt belongings sent with pt to room 233, Nurse Alisa in room at pt transfer. Pt transferred by this RN. Pt VSS and in NAD at time of transport. Pt ambulatory to inpatient bed without complications.

## 2017-04-25 DIAGNOSIS — Z8042 Family history of malignant neoplasm of prostate: Secondary | ICD-10-CM | POA: Diagnosis not present

## 2017-04-25 DIAGNOSIS — Z951 Presence of aortocoronary bypass graft: Secondary | ICD-10-CM | POA: Diagnosis not present

## 2017-04-25 DIAGNOSIS — I110 Hypertensive heart disease with heart failure: Secondary | ICD-10-CM | POA: Diagnosis present

## 2017-04-25 DIAGNOSIS — E1159 Type 2 diabetes mellitus with other circulatory complications: Secondary | ICD-10-CM | POA: Diagnosis not present

## 2017-04-25 DIAGNOSIS — I4891 Unspecified atrial fibrillation: Secondary | ICD-10-CM

## 2017-04-25 DIAGNOSIS — Z953 Presence of xenogenic heart valve: Secondary | ICD-10-CM | POA: Diagnosis not present

## 2017-04-25 DIAGNOSIS — I25118 Atherosclerotic heart disease of native coronary artery with other forms of angina pectoris: Secondary | ICD-10-CM | POA: Diagnosis not present

## 2017-04-25 DIAGNOSIS — R079 Chest pain, unspecified: Secondary | ICD-10-CM | POA: Diagnosis present

## 2017-04-25 DIAGNOSIS — Z7984 Long term (current) use of oral hypoglycemic drugs: Secondary | ICD-10-CM | POA: Diagnosis not present

## 2017-04-25 DIAGNOSIS — I252 Old myocardial infarction: Secondary | ICD-10-CM | POA: Diagnosis not present

## 2017-04-25 DIAGNOSIS — Z8719 Personal history of other diseases of the digestive system: Secondary | ICD-10-CM | POA: Diagnosis not present

## 2017-04-25 DIAGNOSIS — Z7982 Long term (current) use of aspirin: Secondary | ICD-10-CM | POA: Diagnosis not present

## 2017-04-25 DIAGNOSIS — I480 Paroxysmal atrial fibrillation: Secondary | ICD-10-CM | POA: Diagnosis present

## 2017-04-25 DIAGNOSIS — Z955 Presence of coronary angioplasty implant and graft: Secondary | ICD-10-CM | POA: Diagnosis not present

## 2017-04-25 DIAGNOSIS — I481 Persistent atrial fibrillation: Secondary | ICD-10-CM | POA: Diagnosis present

## 2017-04-25 DIAGNOSIS — I255 Ischemic cardiomyopathy: Secondary | ICD-10-CM | POA: Diagnosis present

## 2017-04-25 DIAGNOSIS — I340 Nonrheumatic mitral (valve) insufficiency: Secondary | ICD-10-CM | POA: Diagnosis present

## 2017-04-25 DIAGNOSIS — E119 Type 2 diabetes mellitus without complications: Secondary | ICD-10-CM | POA: Diagnosis present

## 2017-04-25 DIAGNOSIS — Z8249 Family history of ischemic heart disease and other diseases of the circulatory system: Secondary | ICD-10-CM | POA: Diagnosis not present

## 2017-04-25 DIAGNOSIS — E785 Hyperlipidemia, unspecified: Secondary | ICD-10-CM | POA: Diagnosis present

## 2017-04-25 DIAGNOSIS — I25110 Atherosclerotic heart disease of native coronary artery with unstable angina pectoris: Secondary | ICD-10-CM | POA: Diagnosis present

## 2017-04-25 DIAGNOSIS — I5022 Chronic systolic (congestive) heart failure: Secondary | ICD-10-CM | POA: Diagnosis present

## 2017-04-25 DIAGNOSIS — Z7902 Long term (current) use of antithrombotics/antiplatelets: Secondary | ICD-10-CM | POA: Diagnosis not present

## 2017-04-25 DIAGNOSIS — Z833 Family history of diabetes mellitus: Secondary | ICD-10-CM | POA: Diagnosis not present

## 2017-04-25 DIAGNOSIS — I483 Typical atrial flutter: Secondary | ICD-10-CM | POA: Diagnosis present

## 2017-04-25 LAB — CBC
HCT: 36.3 % — ABNORMAL LOW (ref 40.0–52.0)
Hemoglobin: 12.2 g/dL — ABNORMAL LOW (ref 13.0–18.0)
MCH: 27.4 pg (ref 26.0–34.0)
MCHC: 33.6 g/dL (ref 32.0–36.0)
MCV: 81.7 fL (ref 80.0–100.0)
PLATELETS: 180 10*3/uL (ref 150–440)
RBC: 4.44 MIL/uL (ref 4.40–5.90)
RDW: 14.6 % — AB (ref 11.5–14.5)
WBC: 7 10*3/uL (ref 3.8–10.6)

## 2017-04-25 LAB — BASIC METABOLIC PANEL
Anion gap: 7 (ref 5–15)
BUN: 12 mg/dL (ref 6–20)
CALCIUM: 8.8 mg/dL — AB (ref 8.9–10.3)
CO2: 28 mmol/L (ref 22–32)
Chloride: 103 mmol/L (ref 101–111)
Creatinine, Ser: 0.76 mg/dL (ref 0.61–1.24)
GFR calc Af Amer: >60 mL/min (ref >60)
GLUCOSE: 146 mg/dL — AB (ref 65–99)
Potassium: 3.7 mmol/L (ref 3.5–5.1)
SODIUM: 138 mmol/L (ref 135–145)

## 2017-04-25 LAB — GLUCOSE, CAPILLARY
GLUCOSE-CAPILLARY: 136 mg/dL — AB (ref 65–99)
GLUCOSE-CAPILLARY: 193 mg/dL — AB (ref 65–99)
GLUCOSE-CAPILLARY: 142 mg/dL — AB (ref 65–99)
Glucose-Capillary: 230 mg/dL — ABNORMAL HIGH (ref 65–99)

## 2017-04-25 LAB — LIPID PANEL
Cholesterol: 80 mg/dL (ref 0–200)
HDL: 29 mg/dL — ABNORMAL LOW (ref >40)
LDL Cholesterol: 36 mg/dL (ref 0–99)
Total CHOL/HDL Ratio: 2.8 RATIO
Triglycerides: 77 mg/dL (ref <150)
VLDL: 15 mg/dL (ref 0–40)

## 2017-04-25 MED ORDER — DIGOXIN 0.25 MG/ML IJ SOLN: 0.5000 mg | Freq: Once | INTRAMUSCULAR | Status: DC

## 2017-04-25 MED ORDER — NITROGLYCERIN 0.4 MG SL SUBL: 0.4000 mg | SUBLINGUAL_TABLET | SUBLINGUAL | Status: DC | PRN

## 2017-04-25 MED ORDER — DIGOXIN 0.25 MG/ML IJ SOLN
0.5000 mg | Freq: Once | INTRAMUSCULAR | Status: AC
  Administered 2017-04-25: 0.5 mg via INTRAVENOUS
  Filled 2017-04-25: qty 2

## 2017-04-25 MED ORDER — NITROGLYCERIN 0.4 MG SL SUBL
SUBLINGUAL_TABLET | SUBLINGUAL | Status: AC
Start: 2017-04-25 — End: 2017-04-25
  Administered 2017-04-25: 0.4 mg
  Filled 2017-04-25: qty 1

## 2017-04-25 MED ORDER — ASPIRIN EC 81 MG PO TBEC
81.0000 mg | DELAYED_RELEASE_TABLET | Freq: Every day | ORAL | Status: DC
  Administered 2017-04-25 – 2017-04-26 (×2): 81 mg via ORAL
  Filled 2017-04-25 (×2): qty 1

## 2017-04-25 MED ORDER — ISOSORBIDE MONONITRATE ER 30 MG PO TB24
15.0000 mg | ORAL_TABLET | Freq: Every day | ORAL | Status: DC
  Administered 2017-04-25 – 2017-04-26 (×2): 15 mg via ORAL
  Filled 2017-04-25 (×2): qty 1

## 2017-04-25 MED ORDER — ZOLPIDEM TARTRATE 5 MG PO TABS
5.0000 mg | ORAL_TABLET | Freq: Every evening | ORAL | Status: DC | PRN
  Administered 2017-04-26: 5 mg via ORAL
  Filled 2017-04-25: qty 1

## 2017-04-25 MED ORDER — SODIUM CHLORIDE 0.9% FLUSH
3.0000 mL | Freq: Two times a day (BID) | INTRAVENOUS | Status: DC
  Administered 2017-04-25 – 2017-04-26 (×2): 3 mL via INTRAVENOUS

## 2017-04-25 MED ORDER — INSULIN ASPART 100 UNIT/ML ~~LOC~~ SOLN
0.0000 [IU] | Freq: Three times a day (TID) | SUBCUTANEOUS | Status: DC
  Administered 2017-04-25: 1 [IU] via SUBCUTANEOUS
  Administered 2017-04-26: 2 [IU] via SUBCUTANEOUS
  Administered 2017-04-26: 1 [IU] via SUBCUTANEOUS
  Filled 2017-04-25 (×3): qty 1

## 2017-04-25 NOTE — Progress Notes (Signed)
Pt c/o 6/10 chest pain/ no changed on tele/ VSS/ Ryan ,PA made aware/ orders to give dose of SL nitro and IV dig/ pain improved after 1 SL nitro/ will continue to monitor closely

## 2017-04-25 NOTE — Progress Notes (Signed)
Progress Note  Patient Name: Christopher Dolman Sr. Date of Encounter: 04/25/2017  Primary Cardiologist: Kirke Corin  Subjective   Underwent Lexiscan Myoview 10/23 that was abnormal though did not show evidence of reversible ischemia in the proximal LCx distribution. His reversible defects were consistent with disease involving the distal LAD and distal LCx branch. Favored to manage medically at this time. Required SL NTG x 1 overnight. Feels well this morning. No chest pain. Tolerating Ranexa. Heart rates better controlled this morning, remains in Afib.   Inpatient Medications    Scheduled Meds: . aspirin EC  81 mg Oral Daily  . atorvastatin  80 mg Oral QHS  . clopidogrel  75 mg Oral Daily  . heparin  5,000 Units Subcutaneous Q8H  . insulin aspart  0-9 Units Subcutaneous Q6H  . lisinopril  10 mg Oral QHS  . metoprolol succinate  150 mg Oral Daily  . pantoprazole  40 mg Oral Daily  . ranolazine  500 mg Oral BID   Continuous Infusions:  PRN Meds: acetaminophen **OR** acetaminophen, ondansetron **OR** ondansetron (ZOFRAN) IV, oxyCODONE   Vital Signs    Vitals:   04/24/17 1735 04/24/17 1916 04/25/17 0342 04/25/17 0740  BP: 132/78 123/75 (!) 103/57 120/82  Pulse: (!) 113 (!) 112 (!) 53 (!) 51  Resp: 20 18 17 18   Temp: 98.1 F (36.7 C) 98.1 F (36.7 C) 97.9 F (36.6 C) (!) 97.4 F (36.3 C)  TempSrc: Oral Oral Oral Oral  SpO2: 97% 96% 92% 96%  Weight:   230 lb 12.8 oz (104.7 kg)   Height:        Intake/Output Summary (Last 24 hours) at 04/25/17 0918 Last data filed at 04/25/17 0500  Gross per 24 hour  Intake           691.03 ml  Output              460 ml  Net           231.03 ml   Filed Weights   04/23/17 2014 04/24/17 0445 04/25/17 0342  Weight: 230 lb (104.3 kg) 232 lb 4.8 oz (105.4 kg) 230 lb 12.8 oz (104.7 kg)    Telemetry    Afib/flutter, 90s bpm - Personally Reviewed  ECG    n/a - Personally Reviewed  Physical Exam   GEN: No acute distress.   Neck:  No JVD. Cardiac: Irregularly irregular, no murmurs, rubs, or gallops.  Respiratory: Clear to auscultation bilaterally.  GI: Soft, nontender, non-distended.   MS: No edema; No deformity. Neuro:  Alert and oriented x 3; Nonfocal.  Psych: Normal affect.  Labs    Chemistry Recent Labs Lab 04/23/17 2011 04/24/17 0249 04/25/17 0638  NA 136 139 138  K 3.6 3.1* 3.7  CL 101 103 103  CO2 24 28 28   GLUCOSE 302* 155* 146*  BUN 13 13 12   CREATININE 0.82 0.71 0.76  CALCIUM 9.3 8.9 8.8*  GFRNONAA >60 >60 >60  GFRAA >60 >60 >60  ANIONGAP 11 8 7      Hematology Recent Labs Lab 04/23/17 2011 04/24/17 0249 04/25/17 0638  WBC 8.5 8.0 7.0  RBC 4.63 4.66 4.44  HGB 13.0 12.7* 12.2*  HCT 37.4* 38.1* 36.3*  MCV 80.8 81.8 81.7  MCH 28.1 27.3 27.4  MCHC 34.8 33.4 33.6  RDW 14.5 14.4 14.6*  PLT 222 186 180    Cardiac Enzymes Recent Labs Lab 04/23/17 2011 04/24/17 0249 04/24/17 0813  TROPONINI 0.05* 0.07* 0.07*   No results  for input(s): TROPIPOC in the last 168 hours.   BNP Recent Labs Lab 04/23/17 2011  BNP 158.0*     DDimer No results for input(s): DDIMER in the last 168 hours.   Radiology    Dg Chest 2 View  Result Date: 04/23/2017 IMPRESSION: 1. Pulmonary venous congestion, without frank pulmonary edema. 2. Postoperative changes, as above. Electronically Signed   By: Trudie Reed M.D.   On: 04/23/2017 20:33   Nm Myocar Multi W/spect W/wall Motion / Ef  Result Date: 04/24/2017  Abnormal, potentially high risk pharmacologic myocardial perfusion stress test.  There is a small in size, moderate in severity, reversible defect involving the apical anterior and apical lateral segments consistent with ischemia.  There is a small in size, mild in severity, reversible defect involving the mid anterolateral segment consistent with ischemia.  There is a small in size, severe, fixed defect involving the apical inferior and apical segments consistent with scar.  The left  ventricular ejection fraction is mildly decreased (46%).  EKG demonstrates subtle inferior ST-segment elevation and atrial fibrillation with rapid ventricular response immediately following administration of regadenoson.     Cardiac Studies   Myoview 04/24/2017 as above.   LHC 04/02/2017: Coronary Findings   Dominance: Right  Left Main  Vessel is angiographically normal.  Left Anterior Descending  Mid LAD lesion, 70% stenosed.  Dist LAD lesion, 80% stenosed.  First Diagonal Branch  Ost 1st Diag to 1st Diag lesion, 95% stenosed. The lesion was previously treated.  Angioplasty: Angioplasty alone was performed using a BALLOON EUPHORA RX 2.0X12.  Supplies used: BALLOON WOLVERINE 2.00X10; BALLOON WOLVERINE 2.50X10; [place] ZO1.0R60  There is a 50% residual stenosis post intervention.  1st Diag lesion, 70% stenosed.  Left Circumflex  Ost Cx to Mid Cx lesion, 90% stenosed. The lesion was previously treated.  Angioplasty: Angioplasty alone was performed using a BALLOON WOLVERINE 2.50X10.  Supplies used: BALLOON WOLVERINE 3.00X10  There is a 10% residual stenosis post intervention.  Dist Cx lesion, 70% stenosed. The lesion was previously treated.  Angioplasty: A stent was successfully placed.  There is a 10% residual stenosis post intervention.  First Obtuse Marginal Branch  Vessel is small in size. There is mild disease in the vessel.  Third Obtuse Marginal Branch  Ost 3rd Mrg lesion, 90% stenosed.  Right Coronary Artery  Prox RCA lesion, 10% stenosed. The lesion was previously treated.  Mid RCA lesion, 30% stenosed.  Right Posterior Descending Artery  Vessel is angiographically normal.  Right Posterior Atrioventricular Branch  There is mild disease in the vessel.  First Right Posterolateral  There is mild disease in the vessel.  Second Right Posterolateral  There is mild disease in the vessel.  Third Right Posterolateral  There is mild disease in the vessel.    Graft Angiography  Free LIMA Graft to Dist LAD  LIMA and is anatomically normal.  Coronary Diagrams   Diagnostic Diagram       Post-Intervention Diagram          TTE 04/02/2017: Study Conclusions  - Left ventricle: The cavity size was normal. Systolic function was moderately reduced. The estimated ejection fraction was in the range of 35% to 40%. Diffuse hypokinesis. The study was not technically sufficient to allow evaluation of LV diastolic dysfunction due to atrial fibrillation. - Aortic valve: A bioprosthesis sits well in the aortic position. Mobility was not restricted. There was no regurgitation. Mean gradient (S): 14 mm Hg. Peak gradient (S): 23 mm Hg.  Valve area (VTI): 1.17 cm^2. Valve area (Vmax): 1.27 cm^2. Valve area (Vmean): 1.17 cm^2. - Mitral valve: There was mild regurgitation. - Right ventricle: The cavity size was mildly dilated. Wall thickness was normal. Systolic function was mildly reduced. - Tricuspid valve: There was trivial regurgitation. - Pulmonary arteries: Systolic pressure was within the normal range.  Impressions:  - When compared to the prior study from 02/25/2017 LVEF has decreased from 50-55% to 35-40%. Gradients across the bioprosthetic valve are normal. No aortic regurgitation or paravalvular leak. RV is mildly dilated with mildly decreased systolic function.   LHC 02/26/2017: Coronary Findings   Dominance: Right  Left Main  Vessel is angiographically normal.  Left Anterior Descending  Mid LAD lesion, 70% stenosed.  Dist LAD lesion, 100% stenosed.  First Diagonal Branch  Ost 1st Diag to 1st Diag lesion, 95% stenosed. The lesion was previously treated.  1st Diag lesion, 70% stenosed.  Left Circumflex  Prox Cx lesion, 70% stenosed. The lesion was previously treated.  Angioplasty: Lesion crossed with guidewire. Angioplasty alone was performed. The pre-interventional distal flow is normal  (TIMI 3). The post-interventional distal flow is normal (TIMI 3). The intervention was successful . No complications occurred at this lesion.  There is a 15% residual stenosis post intervention.  Dist Cx lesion, 95% stenosed. The lesion was previously treated.  Angioplasty: Angioplasty alone was performed. The pre-interventional distal flow is normal (TIMI 3). The post-interventional distal flow is normal (TIMI 3). The intervention was successful . No complications occurred at this lesion.  There is a 30% residual stenosis post intervention.  First Obtuse Marginal Branch  Vessel is small in size. There is mild disease in the vessel.  Third Obtuse Marginal Branch  Ost 3rd Mrg lesion, 90% stenosed.  Right Coronary Artery  Prox RCA lesion, 10% stenosed. The lesion was previously treated.  Mid RCA lesion, 30% stenosed.  Right Posterior Descending Artery  Vessel is angiographically normal.  Right Posterior Atrioventricular Branch  There is mild disease in the vessel.  First Right Posterolateral  There is mild disease in the vessel.  Second Right Posterolateral  There is mild disease in the vessel.  Third Right Posterolateral  There is mild disease in the vessel.  Graft Angiography  Free LIMA Graft to Dist LAD  LIMA and is anatomically normal.  Coronary Diagrams   Diagnostic Diagram       Post-Intervention Diagram          Patient Profile     66 y.o. male with history of CAD s/p CABG with LIMA-LAD, LCx stenting, and bioprosthetic aortic valve replacement in [place], [place], also with history of ICM/chronic systolic CHF, persistent Afib not on full dose anticoagulation due to recurrent and recent GI bleed, hypertension, hyperlipidemia, and diabetes who is being seen today for the evaluation of chest pain and PAF with RVR at the request of Dr. Anne Hahn, MD.  Assessment & Plan    1. Unstable angina/elevated troponin/CAD: -Currently, without chest pain -Underwent Lexiscan  Myoview 10/23 that was abnormal as above, though not in the proximal LCx distribution -Favoring medical therapy at this time -Add Imdur 30 15 mg daily -Continue Ranexa 500 mg bid (was just started 10/23) -Continue Toprol XL -DAPT with ASA and Plavix -Lipitor  2. Persistent Afib/flutter: -Rates improved in the 90s bpm -Remains in Afib/flutter -Continue increased dose of Toprol XL 150 mg daily for rate control -Not on full dose anticoagulation given recurrent and recent GI bleed as well as in the setting of  dual antiplatelet therapy as above -As an outpatient, could consider rhythm control in an effort to decrease his A. fib burden as he appears to be fairly symptomatic from this given his underlying coronary artery disease. Unfortunately, he is a bit young for amiodarone and is not a flecainide candidate. He may benefit from EP evaluation  3. ICM/chronic systolic CHF: -He does not appear to be volume up at this time -Continue Toprol and lisinopril  -Recent echo as above, no need to repeat at this time -CHF education   4. History of bioprosthetic aortic valve: -Stable by recent echo in 04/2017 -Outpatient follow up  5. HTN: -Stable -Continue current medications  For questions or updates, please contact [facility] Please consult [email or web address] for contact info under Cardiology/STEMI.    Signed, Eula Listenyan Dunn, PA-C [facility] Pager: [phone] 04/25/2017, 9:18 AM   Attending Note Patient seen and examined, agree with detailed note above,  Patient presentation and plan discussed on rounds.   Review of chart details history of  CABG x 1 and Aortic Valve Replacement, DM, HTN.   admitted in August with NSTEMI requiring balloon angioplasty to significant in-stent re-stenosis (ISR) of proximal and distal left circumflex.   admitted 04/23/2017 with dx of unstable angina.  on Ranexa, ASA, Plavix, and Toprol.   persistent A-fib (Toprol), HTN,  recent history of GI Bleed,  Diabetes, ICM with chronic systolic CHF   In follow-up today he reports that he feels well denies significant shortness of breath though has not been in bleeding Beta-blocker dose increased for rate control, rate still 107 bpm at rest  Telemetry reviewed shows persistently elevated heart rate greater than 100 bpm, atrial fibrillation  On physical exam no JVD, lung sounds clear to auscultation bilaterally, heart sounds irregularly irregular with rapid rate, abdomen soft nontender, no significant lower extremity edema  Labs reviewed showing total cholesterol of 80, hematocrit 36, creatinine 0.76  A/P: 1) atrial fibrillation with RVR Difficult to control rate, We will avoid calcium channel blockers in the setting of cardiomyopathy Beta-blocker already increased, blood pressure borderline low We will give load of digoxin 0.5 mg IV x1 Then start 0.25 g daily Not on full anticoagulation given recent GI bleed   2) cardiomyopathy Recent drop in ejection fraction likely secondary to arrhythmia and tachycardia Unable to advance his medications, continue Toprol and lisinopril  3) Bioprosthetic valve  Stable at this time on echo  Greater than 50% was spent in counseling and coordination of care with patient Total encounter time 25 minutes or more   Signed: Dossie Arbourim Gollan  M.D., Ph.D. [facility]

## 2017-04-25 NOTE — Plan of Care (Signed)
Problem: Cardiac: Goal: Ability to achieve and maintain adequate cardiopulmonary perfusion will improve Outcome: Progressing Pt ambulated twice around nurses station, no complaints of chest pain, sob.

## 2017-04-25 NOTE — Progress Notes (Addendum)
Rounded on patient.  Patient has an appointment for Cardiac Rehab orientation tomorrow.  Patient asked this nurse to cancel this appointment since he is still in the hospital.  He needs to check with his sister before rescheduling as he relies on her for transportation.  Patient has hx of CABG x 1 and Aortic Valve Replacement, DM, HTN. Patient was recently admitted in August with NSTEMI requiring balloon angioplasty to significant in-stent re-stenosis (ISR) of proximal and distal left circumflex.  Patient admitted on 04/23/2017 with dx of unstable angina. Patient is now on Ranexa, ASA, Plavix, and Toprol.  Patient also has persistent A-fib (Toprol), HTN, recent history of GI Bleed, Diabetes,ICM with chronic systolic CHF, history of biprosthetic aortic valve replacement.  Patient not felt to be fluid overloaded at this time.  Patient on Toprol and Lisinopril.  Echo with EF 35 - 40%.    Education on HF:  Provided patient with "Living Better with Heart Failure" packet. Briefly reviewed definition of heart failure and signs and symptoms of an exacerbation.  Explained EF and the meaning of EF measurement.    *Reviewed importance of and reason behind checking weight daily in the AM, after using the bathroom, but before getting dressed. Note:  Patient reports weighing himself daily.   *Discussed when to call the Dr= weight gain of >2lb overnight of 5lb in a week,  *Discussed yellow zone= call MD: weight gain of >2lb overnight of 5lb in a week, increased swelling, increased SOB when lying down, chest discomfort, dizziness, increased fatigue *Red Zone= call 911: struggle to breath, fainting or near fainting, significant chest pain  *Reviewed low sodium diet-provided handout of recommended and not recommended foods.  *Instructed patient to take medications as prescribed for heart failure. Explained briefly why pt is on the medications (either make you feel better, live longer or keep you out of the hospital) and  discussed monitoring and side effects.  *Discussed exercise. Informed this patient he may be a candidate for Cardiac Rehab.  *Smoking Cessation - Patient is a non-smoker.   *Provided information on [facility].  [facility]pose [facility] was explained to patient.  I stressed to patient that the HF Clinic does not replace his PCP or Cardiologist, but is an additional resource for help in managing his heart failure. Patient would like appointment in the HF Clinic.  Email sent to HF Clinic requesting appointment.   ? Patient thanked me for providing the information to him.    Army Meliaiane Chantella Creech, RN, BSN, [facility] Cardi[facility] Pulmonary Nurse Navigator

## 2017-04-25 NOTE — Progress Notes (Signed)
[facility] - [place] at [facility]   PATIENT NAME: Christopher Cummings    MR#:  [id number]  DATE OF BIRTH:  04-19-1951  SUBJECTIVE:  CHIEF COMPLAINT:  Pt is reporting intermittent episodes of chest pressure and right shoulder pain, had significant relief after nitroglycerin but reporting headache ,had stress test 04/24/2017  REVIEW OF SYSTEMS:  CONSTITUTIONAL: No fever, fatigue or weakness.  EYES: No blurred or double vision.  EARS, NOSE, AND THROAT: No tinnitus or ear pain.  RESPIRATORY: No cough, shortness of breath, wheezing or hemoptysis.  CARDIOVASCULAR: reporting chest pressure, denies orthopnea, edema.  GASTROINTESTINAL: No nausea, vomiting, diarrhea or abdominal pain.  GENITOURINARY: No dysuria, hematuria.  ENDOCRINE: No polyuria, nocturia,  HEMATOLOGY: No anemia, easy bruising or bleeding SKIN: No rash or lesion. MUSCULOSKELETAL: No joint pain or arthritis.   NEUROLOGIC: No tingling, numbness, weakness.  PSYCHIATRY: No anxiety or depression.   DRUG ALLERGIES:   Allergies  Allergen Reactions  . Metformin And Related Other (See Comments)    Only the regular Metformin    VITALS:  Blood pressure (!) 143/94, pulse (!) 107, temperature 97.8 F (36.6 C), temperature source Oral, resp. rate 18, height 5' 10.5" (1.791 m), weight 104.7 kg (230 lb 12.8 oz), SpO2 93 %.  PHYSICAL EXAMINATION:  GENERAL:  66 y.o.-year-old patient lying in the bed with no acute distress.  EYES: Pupils equal, round, reactive to light and accommodation. No scleral icterus. Extraocular muscles intact.  HEENT: Head atraumatic, normocephalic. Oropharynx and nasopharynx clear.  NECK:  Supple, no jugular venous distention. No thyroid enlargement, no tenderness.  LUNGS: Normal breath sounds bilaterally, no wheezing, rales,rhonchi or crepitation. No use of accessory muscles of respiration.  CARDIOVASCULAR: S1, S2 normal. No murmurs, rubs, or gallops.  ABDOMEN: Soft, nontender,  nondistended. Bowel sounds present. No organomegaly or mass.  EXTREMITIES: No pedal edema, cyanosis, or clubbing.  NEUROLOGIC: Cranial nerves II through XII are intact. Muscle strength 5/5 in all extremities. Sensation intact. Gait not checked.  PSYCHIATRIC: The patient is alert and oriented x 3.  SKIN: No obvious rash, lesion, or ulcer.    LABORATORY PANEL:   CBC  Recent Labs Lab 04/25/17 0638  WBC 7.0  HGB 12.2*  HCT 36.3*  PLT 180   ------------------------------------------------------------------------------------------------------------------  Chemistries   Recent Labs Lab 04/24/17 0249 04/25/17 0638  NA 139 138  K 3.1* 3.7  CL 103 103  CO2 28 28  GLUCOSE 155* 146*  BUN 13 12  CREATININE 0.71 0.76  CALCIUM 8.9 8.8*  MG 1.8  --    ------------------------------------------------------------------------------------------------------------------  Cardiac Enzymes  Recent Labs Lab 04/24/17 0813  TROPONINI 0.07*   ------------------------------------------------------------------------------------------------------------------  RADIOLOGY:  Dg Chest 2 View  Result Date: 04/23/2017 CLINICAL DATA:  66 year old male with history of central chest pain today. EXAM: CHEST  2 VIEW COMPARISON:  Chest x-ray 04/01/2015. FINDINGS: Lung volumes are slightly low. No acute consolidative airspace disease. No pleural effusions. Cephalization of the pulmonary vasculature, without frank pulmonary edema. Heart size is mildly enlarged. Upper mediastinal contours are within normal limits. Status post median sternotomy for aortic valve replacement and CABG including LIMA to the LAD. IMPRESSION: 1. Pulmonary venous congestion, without frank pulmonary edema. 2. Postoperative changes, as above. Electronically Signed   By: Trudie Reed M.D.   On: 04/23/2017 20:33   Nm Myocar Multi W/spect W/wall Motion / Ef  Result Date: 04/24/2017  Abnormal, potentially high risk pharmacologic  myocardial perfusion stress test.  There is a small in size, moderate in  severity, reversible defect involving the apical anterior and apical lateral segments consistent with ischemia.  There is a small in size, mild in severity, reversible defect involving the mid anterolateral segment consistent with ischemia.  There is a small in size, severe, fixed defect involving the apical inferior and apical segments consistent with scar.  The left ventricular ejection fraction is mildly decreased (46%).  EKG demonstrates subtle inferior ST-segment elevation and atrial fibrillation with rapid ventricular response immediately following administration of regadenoson.     EKG:   Orders placed or performed during the hospital encounter of 04/23/17  . EKG 12-Lead  . EKG 12-Lead  . ED EKG within 10 minutes  . ED EKG within 10 minutes  . EKG 12-Lead  . EKG 12-Lead    ASSESSMENT AND PLAN:    Unstable angina (HCC) -  Troponin went up to 0.07 Patient had cardiac stress test 04/24/2017 which has revealed left ventricular ejection fraction decreasing from 50-55% to 35-40% , abnormal stress test continue as is recommending medical management Cardiology is requesting to continue Ranexa Plan is to continue aspirin, Plavix and Toprol Patient's isosorbide was discontinued 30 days ago because of the soft blood pressure   Persistent atrial fibrillation continue Toprol, dose increased to 150 mg for better rate control Not on long-term anticoagulation in view of his recent history of GI bleed also patient is on dual antiplatelet therapy  Ischemic cardiomyopathy with chronic systolic congestive heart failure,history of biprosthetic aortic valve replacement Not fluid overloaded at this time. Continue Toprol and lisinopril Echocardiogram-ejection fraction reduced to 35-40%    HTN (hypertension) - continue metoprolol and lisinopril and titrate as needed    Diabetes (HCC) - sliding scale insulin with  corresponding glucose checks     CAD (coronary artery disease) Aspirin Plavix Toprol and statin    All the records are reviewed and case discussed with Care Management/Social Workerr. Management plans discussed with the patient, and cardiology and they are in agreement.  CODE STATUS: fc  TOTAL TIME TAKING CARE OF THIS PATIENT: 36  minutes.   POSSIBLE D/C IN 1- 2 DAYS, DEPENDING ON CLINICAL CONDITION.  Note: This dictation was prepared with Dragon dictation along with smaller phrase technology. Any transcriptional errors that result from this process are unintentional.   Ramonita LabGouru, Caswell Alvillar M.D on 04/25/2017 at 3:21 PM  Between 7am to 6pm - Pager - [phone] After 6pm go to [email or web address] - password [email or web address]  Fabio Neighborsagle [facility]  Office  [phone]  CC: Primary care physician; Patient, No Pcp Per

## 2017-04-26 ENCOUNTER — Telehealth: Payer: Self-pay | Admitting: Cardiovascular Disease

## 2017-04-26 ENCOUNTER — Ambulatory Visit: Payer: BLUE CROSS/BLUE SHIELD

## 2017-04-26 DIAGNOSIS — Z8719 Personal history of other diseases of the digestive system: Secondary | ICD-10-CM

## 2017-04-26 LAB — GLUCOSE, CAPILLARY
GLUCOSE-CAPILLARY: 185 mg/dL — AB (ref 65–99)
Glucose-Capillary: 135 mg/dL — ABNORMAL HIGH (ref 65–99)

## 2017-04-26 MED ORDER — METOPROLOL SUCCINATE ER 50 MG PO TB24: 150.0000 mg | ORAL_TABLET | Freq: Every day | ORAL | 0 refills | Status: DC

## 2017-04-26 MED ORDER — RANOLAZINE ER 500 MG PO TB12: 500.0000 mg | ORAL_TABLET | Freq: Two times a day (BID) | ORAL | 0 refills | Status: DC

## 2017-04-26 MED ORDER — ACETAMINOPHEN 325 MG PO TABS: 650.0000 mg | ORAL_TABLET | Freq: Four times a day (QID) | ORAL | Status: AC | PRN

## 2017-04-26 MED ORDER — DIGOXIN 125 MCG PO TABS: 0.0625 mg | ORAL_TABLET | Freq: Every day | ORAL | 0 refills | Status: DC

## 2017-04-26 MED ORDER — ISOSORBIDE MONONITRATE ER 30 MG PO TB24: 15.0000 mg | ORAL_TABLET | Freq: Every day | ORAL | 0 refills | Status: DC

## 2017-04-26 NOTE — Progress Notes (Signed)
Progress Note  Patient Name: Christopher Copeman Sr. Date of Encounter: 04/26/2017  Primary Cardiologist: Kirke Corin  Subjective   Ambulated in the hallway on 2A in the PM with no chest pain and well controlled heart rate. In talking with him afterwards he reported "I feel great." Remains in Afib, rate well controlled in the 70s bpm.   "I feel great this morning, I really do." Ambulated late in the day 10/24 without any chest pain and well controlled heart rate. BP stable.   Inpatient Medications    Scheduled Meds: . aspirin EC  81 mg Oral Daily  . atorvastatin  80 mg Oral QHS  . clopidogrel  75 mg Oral Daily  . heparin  5,000 Units Subcutaneous Q8H  . insulin aspart  0-9 Units Subcutaneous TID WC & HS  . isosorbide mononitrate  15 mg Oral Daily  . lisinopril  10 mg Oral QHS  . metoprolol succinate  150 mg Oral Daily  . pantoprazole  40 mg Oral Daily  . ranolazine  500 mg Oral BID  . sodium chloride flush  3 mL Intravenous Q12H   Continuous Infusions:  PRN Meds: acetaminophen **OR** acetaminophen, nitroGLYCERIN, ondansetron **OR** ondansetron (ZOFRAN) IV, oxyCODONE, zolpidem   Vital Signs    Vitals:   04/25/17 2001 04/25/17 2253 04/26/17 0549 04/26/17 0819  BP: 129/71 (!) 117/56 (!) 133/55 (!) 104/50  Pulse: 73 88 71 63  Resp: 18  18 18   Temp: (!) 97.5 F (36.4 C)  (!) 97.5 F (36.4 C) 97.8 F (36.6 C)  TempSrc: Oral  Oral   SpO2: 98% 95% 94% 95%  Weight:   227 lb (103 kg)   Height:        Intake/Output Summary (Last 24 hours) at 04/26/17 0833 Last data filed at 04/26/17 1610  Gross per 24 hour  Intake              480 ml  Output              840 ml  Net             -360 ml   Filed Weights   04/24/17 0445 04/25/17 0342 04/26/17 0549  Weight: 232 lb 4.8 oz (105.4 kg) 230 lb 12.8 oz (104.7 kg) 227 lb (103 kg)    Telemetry    Afib, 70s bpm - Personally Reviewed  ECG    n/a - Personally Reviewed  Physical Exam   GEN: No acute distress.   Neck: No  JVD. Cardiac: Irregularly irregular, no murmurs, rubs, or gallops.  Respiratory: Clear to auscultation bilaterally.  GI: Soft, nontender, non-distended.   MS: No edema; No deformity. Neuro:  Alert and oriented x 3; Nonfocal.  Psych: Normal affect.  Labs    Chemistry Recent Labs Lab 04/23/17 2011 04/24/17 0249 04/25/17 0638  NA 136 139 138  K 3.6 3.1* 3.7  CL 101 103 103  CO2 24 28 28   GLUCOSE 302* 155* 146*  BUN 13 13 12   CREATININE 0.82 0.71 0.76  CALCIUM 9.3 8.9 8.8*  GFRNONAA >60 >60 >60  GFRAA >60 >60 >60  ANIONGAP 11 8 7      Hematology Recent Labs Lab 04/23/17 2011 04/24/17 0249 04/25/17 0638  WBC 8.5 8.0 7.0  RBC 4.63 4.66 4.44  HGB 13.0 12.7* 12.2*  HCT 37.4* 38.1* 36.3*  MCV 80.8 81.8 81.7  MCH 28.1 27.3 27.4  MCHC 34.8 33.4 33.6  RDW 14.5 14.4 14.6*  PLT 222 186 180  Cardiac Enzymes Recent Labs Lab 04/23/17 2011 04/24/17 0249 04/24/17 0813  TROPONINI 0.05* 0.07* 0.07*   No results for input(s): TROPIPOC in the last 168 hours.   BNP Recent Labs Lab 04/23/17 2011  BNP 158.0*     DDimer No results for input(s): DDIMER in the last 168 hours.   Radiology    Nm Myocar Multi W/spect W/wall Motion / Ef  Result Date: 04/24/2017  Abnormal, potentially high risk pharmacologic myocardial perfusion stress test.  There is a small in size, moderate in severity, reversible defect involving the apical anterior and apical lateral segments consistent with ischemia.  There is a small in size, mild in severity, reversible defect involving the mid anterolateral segment consistent with ischemia.  There is a small in size, severe, fixed defect involving the apical inferior and apical segments consistent with scar.  The left ventricular ejection fraction is mildly decreased (46%).  EKG demonstrates subtle inferior ST-segment elevation and atrial fibrillation with rapid ventricular response immediately following administration of regadenoson.     Cardiac  Studies   Myoview 04/24/2017 as above.   LHC 04/02/2017: Coronary Findings   Dominance: Right  Left Main  Vessel is angiographically normal.  Left Anterior Descending  Mid LAD lesion, 70% stenosed.  Dist LAD lesion, 80% stenosed.  First Diagonal Branch  Ost 1st Diag to 1st Diag lesion, 95% stenosed. The lesion was previously treated.  Angioplasty: Angioplasty alone was performed using a BALLOON EUPHORA RX 2.0X12.  Supplies used: BALLOON WOLVERINE 2.00X10; BALLOON WOLVERINE 2.50X10; [place] UY4.0H47RX2.5X12  There is a 50% residual stenosis post intervention.  1st Diag lesion, 70% stenosed.  Left Circumflex  Ost Cx to Mid Cx lesion, 90% stenosed. The lesion was previously treated.  Angioplasty: Angioplasty alone was performed using a BALLOON WOLVERINE 2.50X10.  Supplies used: BALLOON WOLVERINE 3.00X10  There is a 10% residual stenosis post intervention.  Dist Cx lesion, 70% stenosed. The lesion was previously treated.  Angioplasty: A stent was successfully placed.  There is a 10% residual stenosis post intervention.  First Obtuse Marginal Branch  Vessel is small in size. There is mild disease in the vessel.  Third Obtuse Marginal Branch  Ost 3rd Mrg lesion, 90% stenosed.  Right Coronary Artery  Prox RCA lesion, 10% stenosed. The lesion was previously treated.  Mid RCA lesion, 30% stenosed.  Right Posterior Descending Artery  Vessel is angiographically normal.  Right Posterior Atrioventricular Branch  There is mild disease in the vessel.  First Right Posterolateral  There is mild disease in the vessel.  Second Right Posterolateral  There is mild disease in the vessel.  Third Right Posterolateral  There is mild disease in the vessel.  Graft Angiography  Free LIMA Graft to Dist LAD  LIMA and is anatomically normal.  Coronary Diagrams   Diagnostic Diagram       Post-Intervention Diagram          TTE 04/02/2017: Study Conclusions  - Left ventricle:  The cavity size was normal. Systolic function was moderately reduced. The estimated ejection fraction was in the range of 35% to 40%. Diffuse hypokinesis. The study was not technically sufficient to allow evaluation of LV diastolic dysfunction due to atrial fibrillation. - Aortic valve: A bioprosthesis sits well in the aortic position. Mobility was not restricted. There was no regurgitation. Mean gradient (S): 14 mm Hg. Peak gradient (S): 23 mm Hg. Valve area (VTI): 1.17 cm^2. Valve area (Vmax): 1.27 cm^2. Valve area (Vmean): 1.17 cm^2. - Mitral valve: There  was mild regurgitation. - Right ventricle: The cavity size was mildly dilated. Wall thickness was normal. Systolic function was mildly reduced. - Tricuspid valve: There was trivial regurgitation. - Pulmonary arteries: Systolic pressure was within the normal range.  Impressions:  - When compared to the prior study from 02/25/2017 LVEF has decreased from 50-55% to 35-40%. Gradients across the bioprosthetic valve are normal. No aortic regurgitation or paravalvular leak. RV is mildly dilated with mildly decreased systolic function.   LHC 02/26/2017: Coronary Findings   Dominance: Right  Left Main  Vessel is angiographically normal.  Left Anterior Descending  Mid LAD lesion, 70% stenosed.  Dist LAD lesion, 100% stenosed.  First Diagonal Branch  Ost 1st Diag to 1st Diag lesion, 95% stenosed. The lesion was previously treated.  1st Diag lesion, 70% stenosed.  Left Circumflex  Prox Cx lesion, 70% stenosed. The lesion was previously treated.  Angioplasty: Lesion crossed with guidewire. Angioplasty alone was performed. The pre-interventional distal flow is normal (TIMI 3). The post-interventional distal flow is normal (TIMI 3). The intervention was successful . No complications occurred at this lesion.  There is a 15% residual stenosis post intervention.  Dist Cx lesion, 95% stenosed. The lesion was  previously treated.  Angioplasty: Angioplasty alone was performed. The pre-interventional distal flow is normal (TIMI 3). The post-interventional distal flow is normal (TIMI 3). The intervention was successful . No complications occurred at this lesion.  There is a 30% residual stenosis post intervention.  First Obtuse Marginal Branch  Vessel is small in size. There is mild disease in the vessel.  Third Obtuse Marginal Branch  Ost 3rd Mrg lesion, 90% stenosed.  Right Coronary Artery  Prox RCA lesion, 10% stenosed. The lesion was previously treated.  Mid RCA lesion, 30% stenosed.  Right Posterior Descending Artery  Vessel is angiographically normal.  Right Posterior Atrioventricular Branch  There is mild disease in the vessel.  First Right Posterolateral  There is mild disease in the vessel.  Second Right Posterolateral  There is mild disease in the vessel.  Third Right Posterolateral  There is mild disease in the vessel.  Graft Angiography  Free LIMA Graft to Dist LAD  LIMA and is anatomically normal.  Coronary Diagrams   Diagnostic Diagram       Post-Intervention Diagram          Patient Profile     66 y.o. male with history of CAD s/p CABG with LIMA-LAD, LCx stenting, and bioprosthetic aortic valve replacement in [place], [place], also with history of ICM/chronic systolic CHF, persistent Afib not on full dose anticoagulation due to recurrent and recent GI bleed, hypertension, hyperlipidemia, and diabeteswho is being seen today for the evaluation of chest pain and PAF with RVRat the request of Dr. Anne Hahn, MD.  Assessment & Plan    1. Unstable angina/elevated troponin/CAD: -Currently, without chest pain -Underwent Lexiscan Myoview 10/23 that was abnormal as above, though not in the proximal LCx distribution -Favoring medical therapy at this time -Continue Imdur 30 15 mg daily -Continue Ranexa 500 mg bid (was just started 10/23) -Continue Toprol XL -DAPT with ASA  and Plavix -Lipitor  2. Persistent Afib/flutter: -Rates improved in the 70s bpm -Remains in Afib/flutter -Continue increased dose of Toprol XL 150 mg daily for rate control -With well controlled heart rates, his anginal symptoms resolve -He responded well to IV digoxin on 10/24, consider adding low-dose digoxin at time of discharge with outpatient follow up to check a level in ~ 48 hours -Not on  full dose anticoagulation given recurrent and recent GI bleed as well as in the setting of dual antiplatelet therapy as above -As an outpatient, could consider rhythm control in an effort to decrease his A. fib burden as he appears to be fairly symptomatic from this given his underlying coronary artery disease. Unfortunately, he is a bit young for amiodarone and is not a flecainide candidate.He may benefit from EP evaluation  3. ICM/chronic systolic CHF: -He does not appear to be volume up at this time -Continue Toprol and lisinopril  -Recent echo as above, no need to repeat at this time -CHF education   4. History of bioprosthetic aortic valve: -Stable by recent echo in 04/2017 -Outpatient follow up  5. HTN: -Stable -Continue current medications  For questions or updates, please contact [facility] Please consult [email or web address] for contact info under Cardiology/STEMI.    Signed, Eula Listen, PA-C [facility] Pager: [phone] 04/26/2017, 8:33 AM   Attending Note Patient seen and examined, agree with detailed note above,  Patient presentation and plan discussed on rounds.   Profile: CABG x 1 and Aortic Valve Replacement, DM, HTN.   admitted in August with NSTEMI requiring balloon angioplasty to significant in-stent re-stenosis (ISR) of proximal and distal left circumflex.  admitted 04/23/2017 with dx of unstable angina.  on Ranexa, ASA, Plavix, and Toprol.  persistent A-fib (Toprol), HTN,  recent history of GI Bleed, Diabetes, ICM with chronic systolic CHF    In follow-up today he reports that he feels well He received digoxin 0.5 mg IV yesterday, started on digoxin 0.25 mg daily for rate control In addition to his high-dose beta-blocker Heart rate down into the 70s 100 with exertion,   Telemetry reviewed shows atrial fibrillation rate 70-80s at rest Overall he reports feeling much better, ready to go home  On physical exam no JVD, lung sounds clear to auscultation bilaterally, heart sounds irregularly irregular regular rate, abdomen soft nontender, no significant lower extremity edema  Labs reviewed showing total cholesterol of 80, hematocrit 36, creatinine 0.76  A/P: 1) atrial fibrillation with RVR We will avoid calcium channel blockers in the setting of cardiomyopathy Continue high-dose beta-blocker, continue digoxin as an outpatient Not on full anticoagulation given recent GI bleed   2) cardiomyopathy Recent drop in ejection fraction likely secondary to arrhythmia and tachycardia Unable to advance his medications, continue Toprol and lisinopril At a later date may be able to start anticoagulation, try to restore normal sinus rhythm  3) Bioprosthetic valve  Stable at this time on echo Outpatient follow-up  Greater than 50% was spent in counseling and coordination of care with patient Total encounter time 25 minutes or more   Signed: Dossie Arbour  M.D., Ph.D. [facility]

## 2017-04-26 NOTE — Telephone Encounter (Signed)
TCM....  Patient is being discharged   They saw Dr. Okey DupreEnd  They are scheduled to see Clarisa Kindredina Hackney on 05/03/17 - our next available is Brion AlimentBerge 11/20 did not schedule   They were seen for unstable angina  They need to be seen within 5 days  Pt on wait list  Please call

## 2017-04-26 NOTE — Discharge Instructions (Signed)
[facility] appointment on May 03 2017 at 10:00am with Clarisa Kindredina Hackney, FNP. Please call [phone] to reschedule.   Follow-up with cardiology Dr. Mariah MillingGollan in a week Follow-up with primary care physician [facility] Check digoxin levels on 10/29 at cardiology office

## 2017-04-26 NOTE — Telephone Encounter (Signed)
Admitted

## 2017-04-26 NOTE — Progress Notes (Signed)
Christopher Elseharles Sookram Sr. to be D/C'd Home per MD order.  Discussed prescriptions and follow up appointments with the patient. Prescriptions given to patient, medication list explained in detail. Pt verbalized understanding.  Allergies as of 04/26/2017      Reactions   Metformin And Related Other (See Comments)   Only the regular Metformin      Medication List    TAKE these medications   acetaminophen 325 MG tablet Commonly known as:  TYLENOL Take 2 tablets (650 mg total) by mouth every 6 (six) hours as needed for mild pain (or Fever >/= 101).   aspirin EC 81 MG tablet Take 162 mg by mouth daily.   atorvastatin 80 MG tablet Commonly known as:  LIPITOR Take 80 mg by mouth at bedtime.   clopidogrel 75 MG tablet Commonly known as:  PLAVIX Take 75 mg by mouth daily.   digoxin 0.125 MG tablet Commonly known as:  LANOXIN Take 0.5 tablets (0.0625 mg total) by mouth daily.   isosorbide mononitrate 30 MG 24 hr tablet Commonly known as:  IMDUR Take 0.5 tablets (15 mg total) by mouth daily.   lisinopril 10 MG tablet Commonly known as:  PRINIVIL,ZESTRIL Take 10 mg by mouth at bedtime.   meclizine 25 MG tablet Commonly known as:  ANTIVERT Take 25 mg by mouth 3 (three) times daily as needed for dizziness.   metFORMIN 500 MG 24 hr tablet Commonly known as:  GLUCOPHAGE-XR Take 500 mg by mouth.   metoprolol succinate 50 MG 24 hr tablet Commonly known as:  TOPROL-XL Take 3 tablets (150 mg total) by mouth daily. Take with or immediately following a meal. What changed:  medication strength  how much to take   nitroGLYCERIN 0.4 MG SL tablet Commonly known as:  NITROSTAT Place 1 tablet (0.4 mg total) under the tongue every 5 (five) minutes as needed for chest pain.   pantoprazole 40 MG tablet Commonly known as:  PROTONIX Take 40 mg by mouth daily.   ranolazine 500 MG 12 hr tablet Commonly known as:  RANEXA Take 1 tablet (500 mg total) by mouth 2 (two) times daily.        Vitals:   04/26/17 0909 04/26/17 1149  BP: 128/63 (!) 97/58  Pulse: 71 71  Resp:  18  Temp:  97.6 F (36.4 C)  SpO2: 97% 97%    Skin clean, dry and intact without evidence of skin break down, no evidence of skin tears noted. IV catheter discontinued intact. Site without signs and symptoms of complications. Dressing and pressure applied. Tele box removed and returned.Pt denies pain at this time. No complaints noted.  An After Visit Summary was printed and given to the patient. Patient escorted via WC, and D/C home via private auto.  Christopher Cummings

## 2017-04-26 NOTE — Discharge Summary (Addendum)
[facility] - [place] at [facility]   PATIENT NAME: Christopher Cummings    MR#:  [id number]  DATE OF BIRTH:  Mar 02, 1951  DATE OF ADMISSION:  04/23/2017 ADMITTING PHYSICIAN: Oralia Manis, MD  DATE OF DISCHARGE: 10/25/18PRIMARY CARE PHYSICIAN: Patient, No Pcp Per    ADMISSION DIAGNOSIS:  Chest pain, unspecified type [R07.9]  DISCHARGE DIAGNOSIS:  Principal Problem:   Unstable angina (HCC) Active Problems:   HTN (hypertension)   Diabetes (HCC)   CAD (coronary artery disease)   SECONDARY DIAGNOSIS:   Past Medical History:  Diagnosis Date  . Acute systolic heart failure (HCC)   . CAD (coronary artery disease)    10/18 ISR to the p/d LCX, and ostium of diag treated with cutting balloon. EF 35%  . Colitis   . Diabetes mellitus without complication (HCC)   . Diverticulitis   . Hyperlipidemia   . Hypertension     HOSPITAL COURSE:   HPI  Christopher Cummings  is a 66 y.o. male who presents with chest pain.  Patient was recently admitted for cardiac cath. He states that this chest pain tonight was somewhat different than his prior chest pain initially, as was initially sharp. He states that pain lasted for just about a minute and resolved, but residual pain was more of an aching feeling here his troponin was mildly positive at 0.05. Hospitalists were called for admission and further evaluation  Unstable angina (HCC) -  Chest pain resolved Troponin went up to 0.07 Patient had cardiac stress test 04/24/2017 -abnormal  test cardio is recommending medical management Continue Ranexa, aspirin, Plavix and Toprol Imdur 15 mg daily; continue Lipitor   Persistent atrial fibrillation continue Toprol, dose increased to 150 mg for better rate control Not on long-term anticoagulation in view of his recent history of GI bleed also patient is on dual antiplatelet therapy Patient responded well to IV digoxin on October 24 recommending to start low-dose digoxin from 10/26 and  check digoxin level  At cardiology office on Monday, 04/30/2017  Ischemic cardiomyopathy with chronic systolic congestive heart failure,history of biprosthetic aortic valve replacement Not fluid overloaded at this time. Continue Toprol and lisinopril Echocardiogram-ejection fraction reduced to 35-40%  HTN (hypertension) - continue metoprolol and lisinopril and titrate as needed  Diabetes (HCC) - sliding scale insulin with corresponding glucose checks   CAD (coronary artery disease) Aspirin Plavix Toprol and statin  DISCHARGE CONDITIONS:   stable  CONSULTS OBTAINED:  Treatment Team:  Yvonne Kendall, MD   PROCEDURES Myoview stress test  DRUG ALLERGIES:   Allergies  Allergen Reactions  . Metformin And Related Other (See Comments)    Only the regular Metformin    DISCHARGE MEDICATIONS:   Current Discharge Medication List    START taking these medications   Details  acetaminophen (TYLENOL) 325 MG tablet Take 2 tablets (650 mg total) by mouth every 6 (six) hours as needed for mild pain (or Fever >/= 101).    digoxin (LANOXIN) 0.125 MG tablet Take 0.5 tablets (0.0625 mg total) by mouth daily. Qty: 30 tablet, Refills: 0    isosorbide mononitrate (IMDUR) 30 MG 24 hr tablet Take 0.5 tablets (15 mg total) by mouth daily. Qty: 30 tablet, Refills: 0    ranolazine (RANEXA) 500 MG 12 hr tablet Take 1 tablet (500 mg total) by mouth 2 (two) times daily. Qty: 60 tablet, Refills: 0      CONTINUE these medications which have CHANGED   Details  metoprolol succinate (TOPROL-XL) 50 MG 24 hr  tablet Take 3 tablets (150 mg total) by mouth daily. Take with or immediately following a meal. Qty: 90 tablet, Refills: 0      CONTINUE these medications which have NOT CHANGED   Details  aspirin EC 81 MG tablet Take 162 mg by mouth daily.     atorvastatin (LIPITOR) 80 MG tablet Take 80 mg by mouth at bedtime.     clopidogrel (PLAVIX) 75 MG tablet Take 75 mg by mouth daily.     lisinopril (PRINIVIL,ZESTRIL) 10 MG tablet Take 10 mg by mouth at bedtime. Refills: 3    meclizine (ANTIVERT) 25 MG tablet Take 25 mg by mouth 3 (three) times daily as needed for dizziness.    metFORMIN (GLUCOPHAGE-XR) 500 MG 24 hr tablet Take 500 mg by mouth.    nitroGLYCERIN (NITROSTAT) 0.4 MG SL tablet Place 1 tablet (0.4 mg total) under the tongue every 5 (five) minutes as needed for chest pain. Qty: 30 tablet, Refills: 2    pantoprazole (PROTONIX) 40 MG tablet Take 40 mg by mouth daily.         DISCHARGE INSTRUCTIONS:   Heart Failure Clinic appointment on May 03 2017 at 10:00am with Clarisa Kindred, FNP. Please call [phone] to reschedule.   Follow-up with cardiology Dr. Mariah Milling in a week Follow-up with primary care physician or [facility] in a week Check digoxin levels on 10/29 at cardio office   DIET:  Cardiac diet and Diabetic diet  DISCHARGE CONDITION:  Stable  ACTIVITY:  Activity as tolerated  OXYGEN:  Home Oxygen: No.   Oxygen Delivery: room air  DISCHARGE LOCATION:  home   If you experience worsening of your admission symptoms, develop shortness of breath, life threatening emergency, suicidal or homicidal thoughts you must seek medical attention immediately by calling 911 or calling your MD immediately  if symptoms less severe.  You Must read complete instructions/literature along with all the possible adverse reactions/side effects for all the Medicines you take and that have been prescribed to you. Take any new Medicines after you have completely understood and accpet all the possible adverse reactions/side effects.   Please note  You were cared for by a hospitalist during your hospital stay. If you have any questions about your discharge medications or the care you received while you were in the hospital after you are discharged, you can call the unit and asked to speak with the hospitalist on call if the hospitalist that  took care of you is not available. Once you are discharged, your primary care physician will handle any further medical issues. Please note that NO REFILLS for any discharge medications will be authorized once you are discharged, as it is imperative that you return to your primary care physician (or establish a relationship with a primary care physician if you do not have one) for your aftercare needs so that they can reassess your need for medications and monitor your lab values.     Today  Chief Complaint  Patient presents with  . Chest Pain    Patient ambulated in the hallway yesterday and also today. Feels great. Chest pain is resolved. Heart rate is better. Wants to go home. Okay to discharge patient from cardiology standpoint  ROS:  CONSTITUTIONAL: Denies fevers, chills. Denies any fatigue, weakness.  EYES: Denies blurry vision, double vision, eye pain. EARS, NOSE, THROAT: Denies tinnitus, ear pain, hearing loss. RESPIRATORY: Denies cough, wheeze, shortness of breath.  CARDIOVASCULAR: Denies chest pain, palpitations, edema.  GASTROINTESTINAL: Denies  nausea, vomiting, diarrhea, abdominal pain. Denies bright red blood per rectum. GENITOURINARY: Denies dysuria, hematuria. ENDOCRINE: Denies nocturia or thyroid problems. HEMATOLOGIC AND LYMPHATIC: Denies easy bruising or bleeding. SKIN: Denies rash or lesion. MUSCULOSKELETAL: Denies pain in neck, back, shoulder, knees, hips or arthritic symptoms.  NEUROLOGIC: Denies paralysis, paresthesias.  PSYCHIATRIC: Denies anxiety or depressive symptoms.   VITAL SIGNS:  Blood pressure (!) 97/58, pulse 71, temperature 97.6 F (36.4 C), temperature source Oral, resp. rate 18, height 5' 10.5" (1.791 m), weight 103 kg (227 lb), SpO2 97 %.  I/O:    Intake/Output Summary (Last 24 hours) at 04/26/17 1307 Last data filed at 04/26/17 1029  Gross per 24 hour  Intake              720 ml  Output              840 ml  Net             -120 ml     PHYSICAL EXAMINATION:  GENERAL:  66 y.o.-year-old patient lying in the bed with no acute distress.  EYES: Pupils equal, round, reactive to light and accommodation. No scleral icterus. Extraocular muscles intact.  HEENT: Head atraumatic, normocephalic. Oropharynx and nasopharynx clear.  NECK:  Supple, no jugular venous distention. No thyroid enlargement, no tenderness.  LUNGS: Normal breath sounds bilaterally, no wheezing, rales,rhonchi or crepitation. No use of accessory muscles of respiration.  CARDIOVASCULAR: S1, S2 normal. No murmurs, rubs, or gallops.  ABDOMEN: Soft, non-tender, non-distended. Bowel sounds present. No organomegaly or mass.  EXTREMITIES: No pedal edema, cyanosis, or clubbing.  NEUROLOGIC: Cranial nerves II through XII are intact. Muscle strength 5/5 in all extremities. Sensation intact. Gait not checked.  PSYCHIATRIC: The patient is alert and oriented x 3.  SKIN: No obvious rash, lesion, or ulcer.   DATA REVIEW:   CBC  Recent Labs Lab 04/25/17 0638  WBC 7.0  HGB 12.2*  HCT 36.3*  PLT 180    Chemistries   Recent Labs Lab 04/24/17 0249 04/25/17 0638  NA 139 138  K 3.1* 3.7  CL 103 103  CO2 28 28  GLUCOSE 155* 146*  BUN 13 12  CREATININE 0.71 0.76  CALCIUM 8.9 8.8*  MG 1.8  --     Cardiac Enzymes  Recent Labs Lab 04/24/17 0813  TROPONINI 0.07*    Microbiology Results  Results for orders placed or performed during the hospital encounter of 03/31/17  MRSA PCR Screening     Status: None   Collection Time: 03/31/17  8:45 PM  Result Value Ref Range Status   MRSA by PCR NEGATIVE NEGATIVE Final    Comment:        The GeneXpert MRSA Assay (FDA approved for NASAL specimens only), is one component of a comprehensive MRSA colonization surveillance program. It is not intended to diagnose MRSA infection nor to guide or monitor treatment for MRSA infections.     RADIOLOGY:  Dg Chest 2 View  Result Date: 04/23/2017 CLINICAL DATA:   66 year old male with history of central chest pain today. EXAM: CHEST  2 VIEW COMPARISON:  Chest x-ray 04/01/2015. FINDINGS: Lung volumes are slightly low. No acute consolidative airspace disease. No pleural effusions. Cephalization of the pulmonary vasculature, without frank pulmonary edema. Heart size is mildly enlarged. Upper mediastinal contours are within normal limits. Status post median sternotomy for aortic valve replacement and CABG including LIMA to the LAD. IMPRESSION: 1. Pulmonary venous congestion, without frank pulmonary edema. 2. Postoperative changes, as above.  Electronically Signed   By: Trudie Reedaniel  Entrikin M.D.   On: 04/23/2017 20:33   Nm Myocar Multi W/spect W/wall Motion / Ef  Result Date: 04/24/2017  Abnormal, potentially high risk pharmacologic myocardial perfusion stress test.  There is a small in size, moderate in severity, reversible defect involving the apical anterior and apical lateral segments consistent with ischemia.  There is a small in size, mild in severity, reversible defect involving the mid anterolateral segment consistent with ischemia.  There is a small in size, severe, fixed defect involving the apical inferior and apical segments consistent with scar.  The left ventricular ejection fraction is mildly decreased (46%).  EKG demonstrates subtle inferior ST-segment elevation and atrial fibrillation with rapid ventricular response immediately following administration of regadenoson.     EKG:   Orders placed or performed during the hospital encounter of 04/23/17  . EKG 12-Lead  . EKG 12-Lead  . ED EKG within 10 minutes  . ED EKG within 10 minutes  . EKG 12-Lead  . EKG 12-Lead      Management plans discussed with the patient, family and they are in agreement.  CODE STATUS:     Code Status Orders        Start     Ordered   04/24/17 0036  Full code  Continuous     04/24/17 0035    Code Status History    Date Active Date Inactive Code Status  Order ID Comments User Context   03/31/2017  8:25 PM 04/03/2017  6:53 PM Full Code [id number]  Joellyn RuedQureshi, Waqas T, MD Inpatient   03/13/2017  1:41 AM 03/14/2017  3:32 PM Full Code [id number]  Oralia ManisWillis, David, MD Inpatient   02/24/2017  4:20 AM 02/27/2017  2:10 PM Full Code [id number]  Arnaldo Nataliamond, Michael S, MD Inpatient      TOTAL TIME TAKING CARE OF THIS PATIENT: 43 minutes.   Note: This dictation was prepared with Dragon dictation along with smaller phrase technology. Any transcriptional errors that result from this process are unintentional.   @[facility] @  on 04/26/2017 at 1:07 PM  Between 7am to 6pm - Pager - [phone]  After 6pm go to [email or web address] - password [email or web address]  [facility]  Office  [phone]  CC: Primary care physician; Patient, No Pcp Per

## 2017-04-26 NOTE — Care Management (Signed)
No discharge needs identified by members of the care team 

## 2017-04-27 NOTE — Telephone Encounter (Signed)
[place]oke [place] brother [place]. Patient lives with him at this time.   [facility] on 04/26/17.  Patient understands to follow up with provider Dr. Kirke CorinArida on 06/12/17 at 3:00PM [facility].  Patient also has appointment with Inetta Fermoina NP on 05/03/17 at 10:00AM as well.  Advised him to have patient give us a call if he should have any questions regarding his discharge instructions or medications. He verbalized understanding with no further questions at this time.

## 2017-04-30 ENCOUNTER — Ambulatory Visit (INDEPENDENT_AMBULATORY_CARE_PROVIDER_SITE_OTHER): Payer: BLUE CROSS/BLUE SHIELD | Admitting: Nurse Practitioner

## 2017-04-30 ENCOUNTER — Encounter: Payer: Self-pay | Admitting: Nurse Practitioner

## 2017-04-30 VITALS — BP 110/50 | HR 55 | Temp 97.5°F | Resp 16 | Ht 70.5 in | Wt 230.6 lb

## 2017-04-30 DIAGNOSIS — Z7689 Persons encountering health services in other specified circumstances: Secondary | ICD-10-CM | POA: Diagnosis not present

## 2017-04-30 DIAGNOSIS — E1142 Type 2 diabetes mellitus with diabetic polyneuropathy: Secondary | ICD-10-CM | POA: Diagnosis not present

## 2017-04-30 DIAGNOSIS — I10 Essential (primary) hypertension: Secondary | ICD-10-CM

## 2017-04-30 MED ORDER — BLOOD PRESSURE CUFF MISC: 1.0000 [IU] | Freq: Every day | 0 refills | Status: AC

## 2017-04-30 MED ORDER — PANTOPRAZOLE SODIUM 40 MG PO TBEC: 40.0000 mg | DELAYED_RELEASE_TABLET | Freq: Every day | ORAL | 3 refills | Status: DC

## 2017-04-30 MED ORDER — ATORVASTATIN CALCIUM 80 MG PO TABS: 80.0000 mg | ORAL_TABLET | Freq: Every day | ORAL | 3 refills | Status: DC

## 2017-04-30 MED ORDER — BLOOD GLUCOSE METER KIT: PACK | 0 refills | Status: AC

## 2017-04-30 NOTE — Patient Instructions (Signed)
Christopher Cummings, Thank you for coming in to clinic today.  1. For your medications: - Continue all meds without changes today.  2. Keep cardiology appointments.  3. Check daily - Weight - no weight gain of more than 2 lbs in 1 day or 5 lbs in 7 days - Blood Pressure - less than 130/80.  Lower is perfectly fine.  - Fasting morning blood sugar - Goal is less than 120  - Eat low glycemic diet (handout)  Please schedule a follow-up appointment with Christopher Cummings, Christopher Cummings. Return in about 2 months (around 06/30/2017) for Diabetes, GERD.  If you have any other questions or concerns, please feel free to call the clinic or send a message through MyChart. You may also schedule an earlier appointment if necessary.  You will receive a survey after today's visit either digitally by e-mail or paper by [employer] mail. Your experiences and feedback matter to us.  Please respond so we know how we are doing as we provide care for you.   Christopher McardleLauren Kaliel Bolds, DNP, Christopher Cummings-BC Adult Gerontology Nurse Practitioner [facility], [facility]

## 2017-04-30 NOTE — Progress Notes (Signed)
Subjective:    Patient ID: Christopher Ivory Sr., male    DOB: Oct 27, 1950, 66 y.o.   MRN: [id number]  Christopher Apo Sr. is a 66 y.o. male presenting on 04/30/2017 for Establish Care (hospital f/u ) and Hospitalization Follow-up (heart failure )   HPI Establish Care New Provider Pt last seen by PCP a few months ago.  Obtain records from [facility] for [facility] family physicians.  Pt was also cared for within the last 2 years at [place], [place]; [facility] in [place]; hospital in [place], [place] had hospitalizations 4 times since being here and many others in last 2 years.  Brief history review Diabetes - Urinates frequently, but may be from diuretics for CHF.  Glucose = 185 this morning, but notes is now typically around 140s.  Colon Polyps: receives more frequent Colonsocopies.  Will be due in about 1 year.  Heart Failure, Post MI and OHS (CABG x2 and aortic valve replacement w/ tissue valve. On Aug 02, 2015 - Frequent PCI and hospitalization.  Recently has been dealing w/ rapid HR - Diet: "eats everything," but watches portions.   - Physical Activity: walking frequently in past, but [place] neighborhood here.  Hasn't found place to exercise. However, has been instructed to rest and have small short bursts of activity to prevent increased HR for now.  - Has another Cardiology followup end of this week.  Occasional vertigo, but does decide not to drive.  Overall is maintained well and pt is not currently concerned about this.  Needs Prostate cancer screening. Positive Family Hx -> Father, PGF have hx prostate ca  Past Medical History:  Diagnosis Date  . Acute systolic heart failure ([place])   . CAD (coronary artery disease)    10/18 ISR to the p/d LCX, and ostium of diag treated with cutting balloon. EF 35%  . Colitis   . Diabetes mellitus without complication ([place])   . Diverticulitis   . Hyperlipidemia   . Hypertension    Past Surgical History:    Procedure Laterality Date  . AORTIC VALVE REPLACEMENT    . CORONARY ARTERY BYPASS GRAFT    . CORONARY BALLOON ANGIOPLASTY N/A 04/02/2017   Procedure: CORONARY BALLOON ANGIOPLASTY;  Surgeon: Troy Sine, MD;  Location: [facility];  Service: Cardiovascular;  Laterality: N/A;  . CORONARY STENT INTERVENTION N/A 02/26/2017   Procedure: CORONARY STENT INTERVENTION;  Surgeon: Wellington Hampshire, MD;  Location: [facility];  Service: Cardiovascular;  Laterality: N/A;  . CORONARY/GRAFT ANGIOGRAPHY N/A 02/26/2017   Procedure: CORONARY/GRAFT ANGIOGRAPHY;  Surgeon: Wellington Hampshire, MD;  Location: [facility];  Service: Cardiovascular;  Laterality: N/A;  . CORONARY/GRAFT ANGIOGRAPHY N/A 04/02/2017   Procedure: CORONARY/GRAFT ANGIOGRAPHY;  Surgeon: Troy Sine, MD;  Location: [facility];  Service: Cardiovascular;  Laterality: N/A;  . JOINT REPLACEMENT Left    KNEE  . KNEE ARTHROPLASTY  2000   Social History   Social History  . Marital status: Divorced    Spouse name: N/A  . Number of children: N/A  . Years of education: N/A   Occupational History  . Not on file.   Social History Main Topics  . Smoking status: Never Smoker  . Smokeless tobacco: Never Used  . Alcohol use No     Comment: No Alcohol 12 years, but drank 2 "bottles of liquor" each week for several years prior to quitting  . Drug use: No  .  Sexual activity: Not Currently   Other Topics Concern  . Not on file   Social History Narrative  . No narrative on file   Family History  Problem Relation Age of Onset  . CAD Mother   . Heart failure Mother   . Lupus Mother   . CAD Brother   . Heart attack Brother   . Prostate cancer Father   . Diabetes Sister   . Healthy Paternal Grandmother   . Prostate cancer Paternal Grandfather   . Healthy Brother   . Healthy Sister    Current Outpatient Prescriptions on File Prior to Visit  Medication Sig  . acetaminophen (TYLENOL) 325 MG tablet  Take 2 tablets (650 mg total) by mouth every 6 (six) hours as needed for mild pain (or Fever >/= 101).  Marland Kitchen aspirin EC 81 MG tablet Take 162 mg by mouth daily.   . clopidogrel (PLAVIX) 75 MG tablet Take 75 mg by mouth daily.  . digoxin (LANOXIN) 0.125 MG tablet Take 0.5 tablets (0.0625 mg total) by mouth daily.  . isosorbide mononitrate (IMDUR) 30 MG 24 hr tablet Take 0.5 tablets (15 mg total) by mouth daily.  Marland Kitchen lisinopril (PRINIVIL,ZESTRIL) 10 MG tablet Take 10 mg by mouth at bedtime.  . meclizine (ANTIVERT) 25 MG tablet Take 25 mg by mouth 3 (three) times daily as needed for dizziness.  . metFORMIN (GLUCOPHAGE-XR) 500 MG 24 hr tablet Take 500 mg by mouth.  . metoprolol succinate (TOPROL-XL) 50 MG 24 hr tablet Take 3 tablets (150 mg total) by mouth daily. Take with or immediately following a meal.  . ranolazine (RANEXA) 500 MG 12 hr tablet Take 1 tablet (500 mg total) by mouth 2 (two) times daily.  . nitroGLYCERIN (NITROSTAT) 0.4 MG SL tablet Place 1 tablet (0.4 mg total) under the tongue every 5 (five) minutes as needed for chest pain. (Patient not taking: Reported on 04/30/2017)   No current facility-administered medications on file prior to visit.     Review of Systems  Constitutional: Positive for activity change.  HENT: Negative.   Eyes: Negative.   Respiratory: Positive for shortness of breath.   Cardiovascular: Positive for palpitations and leg swelling.  Gastrointestinal: Negative.   Endocrine: Negative.   Genitourinary: Negative.   Musculoskeletal: Negative.   Skin: Negative.   Allergic/Immunologic: Negative.   Neurological: Negative.   Hematological: Negative.   Psychiatric/Behavioral: Negative.    Per HPI unless indicated above     Objective:    BP (!) 110/50 (BP Location: Right Arm, Patient Position: Sitting, Cuff Size: Normal)   Pulse (!) 55   Temp (!) 97.5 F (36.4 C) (Oral)   Resp 16   Ht 5' 10.5" (1.791 m)   Wt 230 lb 9.6 oz (104.6 kg)   BMI 32.62 kg/m   Wt  Readings from Last 3 Encounters:  04/30/17 230 lb 9.6 oz (104.6 kg)  04/26/17 227 lb (103 kg)  04/16/17 230 lb (104.3 kg)    Physical Exam  General - obese, well-appearing, NAD HEENT - Normocephalic, atraumatic Neck - supple, non-tender, no LAD Heart - Normal rate, irregular rhythm, normal M0/N0, mild holosystolic murmur heard Lungs - Clear throughout all lobes, no wheezing, crackles, or rhonchi. Normal work of breathing. Extremeties - non-tender, +1 pedal edema, cap refill < 2 seconds, peripheral pulses intact +2 bilaterally Skin - warm, dry Neuro - awake, alert, oriented x3, normal gait Psych - Normal mood and affect, normal behavior   Hgb A1c MFr Bld  Date Value  Ref Range Status  03/31/2017 8.1 (H) 4.8 - 5.6 % Final    Comment:    (NOTE) Pre diabetes:          5.7%-6.4% Diabetes:              >6.4% Glycemic control for   <7.0% adults with diabetes        Assessment & Plan:   Problem List Items Addressed This Visit      Cardiovascular and Mediastinum   HTN (hypertension)    Currently well controlled on medications.  Pt w/ extensive cardiac disease on multiple medications and is currently managed by cardiology.  Plan: 1. Continue management by cards. 2. Encouraged physical activity as tolerated, but w/in Cards' recommendations. 3. Encouraged DASH eating plan. Continue to avoid all alcohol. 4. Follow up as needed.      Relevant Medications   atorvastatin (LIPITOR) 80 MG tablet   Blood Pressure Monitoring (BLOOD PRESSURE CUFF) MISC     Endocrine   Diabetes ([place]) - Primary    Uncontrolled w/ recent start of metforminER 500 mg once daily.  Pt w/ most recent A1c= 8.1%.    Plan: 1. Reviewed low glycemic diet. 2. Encouraged pt to check daily cbg fasting am. 3. Consider starting Jardiance for additional therapy w/ cardiac disease.  Pt defers additional med today, but will start if CBG checks at next visit remain consistent w/ A1c > 8.0% 4. Follow up 2 months.       Relevant Medications   atorvastatin (LIPITOR) 80 MG tablet   blood glucose meter kit and supplies    Other Visit Diagnoses    Encounter to establish care       Pt relocated to [place] from [place].  Needs new PCP closer to home.  Recent care provided at [facility].  Records will be requested and reviewed in [place].        Meds ordered this encounter  Medications  . pantoprazole (PROTONIX) 40 MG tablet    Sig: Take 1 tablet (40 mg total) by mouth daily.    Dispense:  90 tablet    Refill:  3    Order Specific Question:   Supervising Provider    Answer:   Olin Hauser [[id number]]  . atorvastatin (LIPITOR) 80 MG tablet    Sig: Take 1 tablet (80 mg total) by mouth at bedtime.    Dispense:  90 tablet    Refill:  3    Order Specific Question:   Supervising Provider    Answer:   Olin Hauser [[id number]]  . blood glucose meter kit and supplies    Sig: Dispense based on patient and insurance preference. Use 1 time daily as directed. (FOR ICD-9 250.00, 250.01).    Dispense:  1 each    Refill:  0    Order Specific Question:   Supervising Provider    Answer:   Olin Hauser [[id number]]    Order Specific Question:   Number of strips    Answer:   100    Order Specific Question:   Number of lancets    Answer:   100  . Blood Pressure Monitoring (BLOOD PRESSURE CUFF) MISC    Sig: 1 Units by Does not apply route daily.    Dispense:  1 each    Refill:  0    Order Specific Question:   Supervising Provider    Answer:   Olin Hauser [[id number]]      Follow up plan: Return  in about 2 months (around 06/30/2017) for Diabetes, GERD.  Cassell Smiles, DNP, AGPCNP-BC Adult Gerontology Primary Care Nurse Practitioner [facility] 05/02/2017, 7:34 PM

## 2017-05-01 ENCOUNTER — Other Ambulatory Visit: Payer: Self-pay

## 2017-05-01 ENCOUNTER — Telehealth: Payer: Self-pay | Admitting: Cardiovascular Disease

## 2017-05-01 DIAGNOSIS — Z79899 Other long term (current) drug therapy: Principal | ICD-10-CM

## 2017-05-01 DIAGNOSIS — Z5181 Encounter for therapeutic drug level monitoring: Secondary | ICD-10-CM

## 2017-05-01 NOTE — Telephone Encounter (Signed)
Discussed with Dr. Kirke CorinArida who is agreeable to 11/1 dig lab and f/u in two weeks. Patient agreeable to November 6, 10am with Ward Givenshris Berge, NP.

## 2017-05-01 NOTE — Telephone Encounter (Signed)
Pt admitted [facility] 10/22- 10/25 for chest pain, unstable angina.  He was started on digoxin; discharge notes state pt needs dig level on 10/29.  He was given an 11/1  f/u appt w/Tina Bing NeighborsHackney, NP, and 12/11 w/Dr. Kirke CorinArida. Pt has hx of CHF. I s/w pt and offered 1:40pm appt today with Dr. Kirke CorinArida but pt is unable due to transportation issues. We discussed need for digoxin level when he comes to Nov 1 appt at [facility]. [facility] or [facility]t. Based on discharge diagnosis, he will need sooner appt w/Dr. Kirke CorinArida.  Will call pt back with alternate appt date. Lab order entered. Notified HF clinic to remind pt of lab work.

## 2017-05-02 ENCOUNTER — Encounter: Payer: Self-pay | Admitting: Nurse Practitioner

## 2017-05-02 NOTE — Assessment & Plan Note (Addendum)
Uncontrolled w/ recent start of metforminER 500 mg once daily.  Pt w/ most recent A1c= 8.1%.    Plan: 1. Reviewed low glycemic diet. 2. Encouraged pt to check daily cbg fasting am. 3. Consider starting Jardiance for additional therapy w/ cardiac disease.  Pt defers additional med today, but will start if CBG checks at next visit remain consistent w/ A1c > 8.0% 4. Follow up 2 months.

## 2017-05-02 NOTE — Assessment & Plan Note (Signed)
Currently well controlled on medications.  Pt w/ extensive cardiac disease on multiple medications and is currently managed by cardiology.  Plan: 1. Continue management by cards. 2. Encouraged physical activity as tolerated, but w/in Cards' recommendations. 3. Encouraged DASH eating plan. Continue to avoid all alcohol. 4. Follow up as needed.

## 2017-05-03 ENCOUNTER — Ambulatory Visit: Payer: BLUE CROSS/BLUE SHIELD | Admitting: Family

## 2017-05-03 ENCOUNTER — Telehealth: Payer: Self-pay | Admitting: Family

## 2017-05-03 NOTE — Telephone Encounter (Signed)
Patient did not show for his Heart Failure Clinic appointment on 05/03/17. Will attempt to reschedule.  

## 2017-05-06 ENCOUNTER — Telehealth: Payer: Self-pay | Admitting: Internal Medicine

## 2017-05-06 NOTE — Telephone Encounter (Signed)
Mr Donah DriverFanelli, called c/o  Chest pain, , angina, persisiting in spite of nitro , ranexa,  4/10 , We  Advised him to call 911 and  Be taken to ER  For  furtehr  Detailed  eval .

## 2017-05-07 ENCOUNTER — Other Ambulatory Visit: Payer: Self-pay

## 2017-05-07 ENCOUNTER — Inpatient Hospital Stay
Admission: EM | Admit: 2017-05-07 | Discharge: 2017-05-09 | DRG: 247 | Disposition: A | Discharge status: Home / Self Care | Payer: BLUE CROSS/BLUE SHIELD | Attending: Internal Medicine | Admitting: Internal Medicine

## 2017-05-07 ENCOUNTER — Emergency Department: Payer: BLUE CROSS/BLUE SHIELD

## 2017-05-07 DIAGNOSIS — I5022 Chronic systolic (congestive) heart failure: Secondary | ICD-10-CM | POA: Diagnosis present

## 2017-05-07 DIAGNOSIS — E119 Type 2 diabetes mellitus without complications: Secondary | ICD-10-CM

## 2017-05-07 DIAGNOSIS — I214 Non-ST elevation (NSTEMI) myocardial infarction: Secondary | ICD-10-CM | POA: Diagnosis present

## 2017-05-07 DIAGNOSIS — Z832 Family history of diseases of the blood and blood-forming organs and certain disorders involving the immune mechanism: Secondary | ICD-10-CM

## 2017-05-07 DIAGNOSIS — I255 Ischemic cardiomyopathy: Secondary | ICD-10-CM | POA: Diagnosis present

## 2017-05-07 DIAGNOSIS — Z8249 Family history of ischemic heart disease and other diseases of the circulatory system: Secondary | ICD-10-CM

## 2017-05-07 DIAGNOSIS — I200 Unstable angina: Secondary | ICD-10-CM

## 2017-05-07 DIAGNOSIS — I10 Essential (primary) hypertension: Secondary | ICD-10-CM | POA: Diagnosis not present

## 2017-05-07 DIAGNOSIS — R000 Tachycardia, unspecified: Secondary | ICD-10-CM | POA: Diagnosis present

## 2017-05-07 DIAGNOSIS — M79602 Pain in left arm: Secondary | ICD-10-CM | POA: Diagnosis not present

## 2017-05-07 DIAGNOSIS — T82855A Stenosis of coronary artery stent, initial encounter: Secondary | ICD-10-CM | POA: Diagnosis present

## 2017-05-07 DIAGNOSIS — Z7984 Long term (current) use of oral hypoglycemic drugs: Secondary | ICD-10-CM

## 2017-05-07 DIAGNOSIS — I252 Old myocardial infarction: Secondary | ICD-10-CM

## 2017-05-07 DIAGNOSIS — Y831 Surgical operation with implant of artificial internal device as the cause of abnormal reaction of the patient, or of later complication, without mention of misadventure at the time of the procedure: Secondary | ICD-10-CM | POA: Diagnosis present

## 2017-05-07 DIAGNOSIS — R748 Abnormal levels of other serum enzymes: Secondary | ICD-10-CM

## 2017-05-07 DIAGNOSIS — E1159 Type 2 diabetes mellitus with other circulatory complications: Secondary | ICD-10-CM

## 2017-05-07 DIAGNOSIS — Z951 Presence of aortocoronary bypass graft: Secondary | ICD-10-CM

## 2017-05-07 DIAGNOSIS — Z7982 Long term (current) use of aspirin: Secondary | ICD-10-CM | POA: Diagnosis not present

## 2017-05-07 DIAGNOSIS — Z96652 Presence of left artificial knee joint: Secondary | ICD-10-CM | POA: Diagnosis present

## 2017-05-07 DIAGNOSIS — I25110 Atherosclerotic heart disease of native coronary artery with unstable angina pectoris: Secondary | ICD-10-CM | POA: Diagnosis present

## 2017-05-07 DIAGNOSIS — I482 Chronic atrial fibrillation: Secondary | ICD-10-CM | POA: Diagnosis present

## 2017-05-07 DIAGNOSIS — Z7902 Long term (current) use of antithrombotics/antiplatelets: Secondary | ICD-10-CM

## 2017-05-07 DIAGNOSIS — Z833 Family history of diabetes mellitus: Secondary | ICD-10-CM | POA: Diagnosis not present

## 2017-05-07 DIAGNOSIS — E785 Hyperlipidemia, unspecified: Secondary | ICD-10-CM | POA: Diagnosis present

## 2017-05-07 DIAGNOSIS — I110 Hypertensive heart disease with heart failure: Secondary | ICD-10-CM | POA: Diagnosis present

## 2017-05-07 DIAGNOSIS — Z8042 Family history of malignant neoplasm of prostate: Secondary | ICD-10-CM | POA: Diagnosis not present

## 2017-05-07 DIAGNOSIS — I2510 Atherosclerotic heart disease of native coronary artery without angina pectoris: Secondary | ICD-10-CM | POA: Diagnosis present

## 2017-05-07 DIAGNOSIS — I25118 Atherosclerotic heart disease of native coronary artery with other forms of angina pectoris: Secondary | ICD-10-CM | POA: Diagnosis not present

## 2017-05-07 DIAGNOSIS — I4892 Unspecified atrial flutter: Secondary | ICD-10-CM | POA: Diagnosis present

## 2017-05-07 DIAGNOSIS — I481 Persistent atrial fibrillation: Secondary | ICD-10-CM | POA: Diagnosis present

## 2017-05-07 DIAGNOSIS — Z953 Presence of xenogenic heart valve: Secondary | ICD-10-CM

## 2017-05-07 LAB — BASIC METABOLIC PANEL
ANION GAP: 9 (ref 5–15)
Anion gap: 11 (ref 5–15)
BUN: 17 mg/dL (ref 6–20)
BUN: 18 mg/dL (ref 6–20)
CALCIUM: 9.2 mg/dL (ref 8.9–10.3)
CHLORIDE: 98 mmol/L — AB (ref 101–111)
CHLORIDE: 99 mmol/L — AB (ref 101–111)
CO2: 25 mmol/L (ref 22–32)
CO2: 26 mmol/L (ref 22–32)
CREATININE: 0.84 mg/dL (ref 0.61–1.24)
Calcium: 9 mg/dL (ref 8.9–10.3)
Creatinine, Ser: 0.72 mg/dL (ref 0.61–1.24)
GFR calc non Af Amer: >60 mL/min (ref >60)
Glucose, Bld: 258 mg/dL — ABNORMAL HIGH (ref 65–99)
Glucose, Bld: 319 mg/dL — ABNORMAL HIGH (ref 65–99)
POTASSIUM: 3.8 mmol/L (ref 3.5–5.1)
Potassium: 3.9 mmol/L (ref 3.5–5.1)
SODIUM: 134 mmol/L — AB (ref 135–145)
SODIUM: 134 mmol/L — AB (ref 135–145)

## 2017-05-07 LAB — GLUCOSE, CAPILLARY
GLUCOSE-CAPILLARY: 246 mg/dL — AB (ref 65–99)
GLUCOSE-CAPILLARY: 321 mg/dL — AB (ref 65–99)
Glucose-Capillary: 243 mg/dL — ABNORMAL HIGH (ref 65–99)
Glucose-Capillary: 246 mg/dL — ABNORMAL HIGH (ref 65–99)
Glucose-Capillary: 235 mg/dL — ABNORMAL HIGH (ref 65–99)

## 2017-05-07 LAB — TROPONIN I
Troponin I: 0.21 ng/mL (ref <0.03)
Troponin I: 0.24 ng/mL (ref <0.03)
Troponin I: 0.26 ng/mL (ref <0.03)
Troponin I: 0.26 ng/mL (ref <0.03)

## 2017-05-07 LAB — CBC
HCT: 37.9 % — ABNORMAL LOW (ref 40.0–52.0)
HEMATOCRIT: 38.5 % — AB (ref 40.0–52.0)
HEMOGLOBIN: 13 g/dL (ref 13.0–18.0)
Hemoglobin: 12.8 g/dL — ABNORMAL LOW (ref 13.0–18.0)
MCH: 27.5 pg (ref 26.0–34.0)
MCH: 27.5 pg (ref 26.0–34.0)
MCHC: 33.7 g/dL (ref 32.0–36.0)
MCHC: 33.7 g/dL (ref 32.0–36.0)
MCV: 81.5 fL (ref 80.0–100.0)
MCV: 81.5 fL (ref 80.0–100.0)
Platelets: 211 10*3/uL (ref 150–440)
Platelets: 228 10*3/uL (ref 150–440)
RBC: 4.65 MIL/uL (ref 4.40–5.90)
RBC: 4.73 MIL/uL (ref 4.40–5.90)
RDW: 14.7 % — AB (ref 11.5–14.5)
RDW: 14.9 % — ABNORMAL HIGH (ref 11.5–14.5)
WBC: 8.8 10*3/uL (ref 3.8–10.6)
WBC: 9.3 10*3/uL (ref 3.8–10.6)

## 2017-05-07 LAB — HEMOGLOBIN A1C
HEMOGLOBIN A1C: 9.3 % — AB (ref 4.8–5.6)
MEAN PLASMA GLUCOSE: 220.21 mg/dL

## 2017-05-07 LAB — HEPARIN LEVEL (UNFRACTIONATED)
HEPARIN UNFRACTIONATED: 0.67 [IU]/mL (ref 0.30–0.70)
Heparin Unfractionated: 0.6 IU/mL (ref 0.30–0.70)

## 2017-05-07 LAB — PROTIME-INR
INR: 1.21
Prothrombin Time: 15.2 seconds (ref 11.4–15.2)

## 2017-05-07 LAB — APTT: aPTT: 30 seconds (ref 24–36)

## 2017-05-07 MED ORDER — ACETAMINOPHEN 325 MG PO TABS
650.0000 mg | ORAL_TABLET | Freq: Four times a day (QID) | ORAL | Status: DC | PRN
  Administered 2017-05-07 (×2): 650 mg via ORAL
  Filled 2017-05-07 (×2): qty 2

## 2017-05-07 MED ORDER — CLOPIDOGREL BISULFATE 75 MG PO TABS
75.0000 mg | ORAL_TABLET | Freq: Every day | ORAL | Status: DC
  Administered 2017-05-07 – 2017-05-09 (×3): 75 mg via ORAL
  Filled 2017-05-07 (×3): qty 1

## 2017-05-07 MED ORDER — ASPIRIN EC 81 MG PO TBEC
162.0000 mg | DELAYED_RELEASE_TABLET | Freq: Every day | ORAL | Status: DC
  Administered 2017-05-07: 162 mg via ORAL
  Filled 2017-05-07: qty 2

## 2017-05-07 MED ORDER — ACETAMINOPHEN 650 MG RE SUPP
650.0000 mg | Freq: Four times a day (QID) | RECTAL | Status: DC | PRN
Start: 2017-05-07 — End: 2017-05-09

## 2017-05-07 MED ORDER — INSULIN ASPART 100 UNIT/ML ~~LOC~~ SOLN
0.0000 [IU] | Freq: Three times a day (TID) | SUBCUTANEOUS | Status: DC
  Administered 2017-05-07 – 2017-05-08 (×2): 3 [IU] via SUBCUTANEOUS
  Administered 2017-05-08 – 2017-05-09 (×2): 1 [IU] via SUBCUTANEOUS
  Administered 2017-05-09: 3 [IU] via SUBCUTANEOUS
  Filled 2017-05-07 (×5): qty 1

## 2017-05-07 MED ORDER — DIGOXIN 125 MCG PO TABS
0.0625 mg | ORAL_TABLET | Freq: Every day | ORAL | Status: DC
  Administered 2017-05-07 – 2017-05-09 (×3): 0.0625 mg via ORAL
  Filled 2017-05-07 (×3): qty 0.5

## 2017-05-07 MED ORDER — HEPARIN (PORCINE) IN NACL 100-0.45 UNIT/ML-% IJ SOLN
1300.0000 [IU]/h | INTRAMUSCULAR | Status: DC
  Administered 2017-05-07 (×2): 1300 [IU]/h via INTRAVENOUS
  Filled 2017-05-07 (×2): qty 250

## 2017-05-07 MED ORDER — INSULIN GLARGINE 100 UNIT/ML ~~LOC~~ SOLN
10.0000 [IU] | Freq: Every day | SUBCUTANEOUS | Status: DC
  Administered 2017-05-07: 10 [IU] via SUBCUTANEOUS
  Filled 2017-05-07 (×2): qty 0.1

## 2017-05-07 MED ORDER — SODIUM CHLORIDE 0.9% FLUSH: 3.0000 mL | INTRAVENOUS | Status: DC | PRN

## 2017-05-07 MED ORDER — RANOLAZINE ER 500 MG PO TB12
500.0000 mg | ORAL_TABLET | Freq: Once | ORAL | Status: AC
  Administered 2017-05-07: 500 mg via ORAL
  Filled 2017-05-07: qty 1

## 2017-05-07 MED ORDER — PANTOPRAZOLE SODIUM 40 MG PO TBEC
40.0000 mg | DELAYED_RELEASE_TABLET | Freq: Every day | ORAL | Status: DC
  Administered 2017-05-07 – 2017-05-09 (×3): 40 mg via ORAL
  Filled 2017-05-07 (×3): qty 1

## 2017-05-07 MED ORDER — SODIUM CHLORIDE 0.9% FLUSH
3.0000 mL | Freq: Two times a day (BID) | INTRAVENOUS | Status: DC
  Administered 2017-05-07 – 2017-05-09 (×4): 3 mL via INTRAVENOUS

## 2017-05-07 MED ORDER — ATORVASTATIN CALCIUM 20 MG PO TABS
80.0000 mg | ORAL_TABLET | Freq: Every day | ORAL | Status: DC
  Administered 2017-05-07 – 2017-05-08 (×2): 80 mg via ORAL
  Filled 2017-05-07 (×2): qty 4

## 2017-05-07 MED ORDER — ISOSORBIDE MONONITRATE ER 30 MG PO TB24
15.0000 mg | ORAL_TABLET | Freq: Every day | ORAL | Status: DC
  Administered 2017-05-07: 15 mg via ORAL
  Filled 2017-05-07 (×2): qty 1

## 2017-05-07 MED ORDER — ISOSORBIDE MONONITRATE ER 30 MG PO TB24
30.0000 mg | ORAL_TABLET | Freq: Every day | ORAL | Status: DC
  Administered 2017-05-08 – 2017-05-09 (×2): 30 mg via ORAL
  Filled 2017-05-07 (×2): qty 1

## 2017-05-07 MED ORDER — OXYCODONE HCL 5 MG PO TABS
5.0000 mg | ORAL_TABLET | ORAL | Status: DC | PRN
  Administered 2017-05-08: 5 mg via ORAL
  Filled 2017-05-07: qty 1

## 2017-05-07 MED ORDER — METOPROLOL SUCCINATE ER 50 MG PO TB24
150.0000 mg | ORAL_TABLET | Freq: Every day | ORAL | Status: DC
  Administered 2017-05-07 – 2017-05-08 (×2): 150 mg via ORAL
  Administered 2017-05-09: 100 mg via ORAL
  Filled 2017-05-07 (×3): qty 1

## 2017-05-07 MED ORDER — NITROGLYCERIN 2 % TD OINT
1.0000 [in_us] | TOPICAL_OINTMENT | Freq: Once | TRANSDERMAL | Status: AC
  Administered 2017-05-07: 1 [in_us] via TOPICAL
  Filled 2017-05-07: qty 1

## 2017-05-07 MED ORDER — HEPARIN BOLUS VIA INFUSION
4000.0000 [IU] | Freq: Once | INTRAVENOUS | Status: AC
  Administered 2017-05-07: 4000 [IU] via INTRAVENOUS
  Filled 2017-05-07: qty 4000

## 2017-05-07 MED ORDER — INSULIN ASPART 100 UNIT/ML ~~LOC~~ SOLN
0.0000 [IU] | Freq: Three times a day (TID) | SUBCUTANEOUS | Status: DC
  Administered 2017-05-07: 7 [IU] via SUBCUTANEOUS
  Administered 2017-05-07: 3 [IU] via SUBCUTANEOUS
  Filled 2017-05-07 (×2): qty 1

## 2017-05-07 MED ORDER — MORPHINE SULFATE (PF) 2 MG/ML IV SOLN: 1.0000 mg | INTRAVENOUS | Status: DC | PRN

## 2017-05-07 MED ORDER — LISINOPRIL 10 MG PO TABS
10.0000 mg | ORAL_TABLET | Freq: Every day | ORAL | Status: DC
  Administered 2017-05-07 – 2017-05-08 (×2): 10 mg via ORAL
  Filled 2017-05-07 (×2): qty 1

## 2017-05-07 MED ORDER — RANOLAZINE ER 500 MG PO TB12
500.0000 mg | ORAL_TABLET | Freq: Two times a day (BID) | ORAL | Status: DC
  Administered 2017-05-07: 500 mg via ORAL
  Filled 2017-05-07 (×2): qty 1

## 2017-05-07 MED ORDER — INSULIN ASPART 100 UNIT/ML ~~LOC~~ SOLN
0.0000 [IU] | Freq: Every day | SUBCUTANEOUS | Status: DC
  Administered 2017-05-07: 2 [IU] via SUBCUTANEOUS
  Administered 2017-05-08: 3 [IU] via SUBCUTANEOUS
  Filled 2017-05-07 (×2): qty 1

## 2017-05-07 MED ORDER — INSULIN ASPART 100 UNIT/ML ~~LOC~~ SOLN: 0.0000 [IU] | Freq: Every day | SUBCUTANEOUS | Status: DC

## 2017-05-07 MED ORDER — ISOSORBIDE MONONITRATE ER 30 MG PO TB24
15.0000 mg | ORAL_TABLET | Freq: Once | ORAL | Status: AC
  Administered 2017-05-07: 15 mg via ORAL
  Filled 2017-05-07: qty 1

## 2017-05-07 MED ORDER — ONDANSETRON HCL 4 MG PO TABS: 4.0000 mg | ORAL_TABLET | Freq: Four times a day (QID) | ORAL | Status: DC | PRN

## 2017-05-07 MED ORDER — RANOLAZINE ER 500 MG PO TB12
1000.0000 mg | ORAL_TABLET | Freq: Two times a day (BID) | ORAL | Status: DC
  Administered 2017-05-07 – 2017-05-09 (×4): 1000 mg via ORAL
  Filled 2017-05-07 (×5): qty 2

## 2017-05-07 MED ORDER — SODIUM CHLORIDE 0.9 % IV SOLN
INTRAVENOUS | Status: DC
  Administered 2017-05-08: 06:00:00 via INTRAVENOUS

## 2017-05-07 MED ORDER — ONDANSETRON HCL 4 MG/2ML IJ SOLN: 4.0000 mg | Freq: Four times a day (QID) | INTRAMUSCULAR | Status: DC | PRN

## 2017-05-07 NOTE — Consult Note (Signed)
Cardiology Consultation:   Patient ID: Christopher Boodram Sr.; [id number]; 07/18/1950   Admit date: 05/07/2017 Date of Consult: 05/07/2017  Primary Care Provider: [name], NP Primary Cardiologist: Fletcher Anon   Patient Profile:   [name] is a 66 y.o. male with a hx of CAD s/p CABG with LIMA-LAD, LCx stenting, and bioprosthetic aortic valve replacement in [place], [place], also with history of ICM/chronic systolic CHF, persistent Afib not on full dose anticoagulation due to recurrent and recent GI bleed, hypertension, hyperlipidemia, and diabetes who is being seen today for the evaluation of chest pain/elevated troponin at the request of Dr. Jannifer Franklin, MD.  History of Present Illness:   Mr. Sakuma had a non-ST segment elevation myocardial infarction in August, 2018 requiring hospitalization at [facility].  He was found to have in-stent restenosis of the left circumflex, as well as a diagonal.  The diagonal was a long segment of stents, not suitable for angioplasty, and was left to be treated weekly.  He underwent PTCA for in-stent restenosis in the proximal and distal left circumflex.  He subsequently had a lower GI bleed in September, 2018 though dual antiplatelet therapy was not interrupted.  He was admitted to [facility] in late September, 2018 with unstable angina.  He underwent cardiac catheterization which showed restenosis in the left circumflex.  He underwent repeat cutting balloon angioplasty to the left circumflex and diagonal.  The left circumflex results were optimal but the diagonal angioplasty was suboptimal with 50% residual stenosis.  Echocardiogram at that time showed an EF of 35-40% with normal functioning bioprosthetic valve, mild mitral regurgitation, and no pulmonary hypertension.  He was most recently seen in our office in mid October, 2018 and referred to cardiac rehabilitation at that time.  It was felt the diagonal artery was high risk for occlusion and  probably not worth attempting angioplasty in the future.  It was felt the left circumflex could be treated with a drug-eluting stent if he tolerated long-term dual antiplatelet therapy.  He was noted to be in A. fib with RVR at that time and his metoprolol was increased along with discontinuation of isosorbide in an effort to avoid hypotension.  It was preferred to avoid anticoagulation at that time given his recurrent GI bleed and need for dual antiplatelet therapy as above. He was admitted in late October, 2018 for unstable angina in the setting of Afib/flutter with RVR. He underwent Lexiscan Myoview that showed small in size, moderate in severity, reversible defect involving the apical anterior and apical lateral segments c/w ischemia. There was also a small in size, mild in severity, reversible defect involving the mid anterolateral segment c/w ischemia. There was a small in size, severe, fixed defect involving the apical inferior and apical segments c/w scar. EF ~ 46%. EKG demonstrated subtle inferior st segment elevation and Afib with RVR immediately following administration of Lexiscan. This was an abnormal study, potentially high risk. Given his comorbidities he was medically managed. It was felt, given his underlying CAD, when he developed tachycardic heart rates he had angina. Better rate control along with addition of Imdur were done. He did well at home until 11/4 when he developed chest pain in the morning that improved with SL NTG x 1. He had recurrence of chest pain that radiated to the left arm on the evening of 11/4. He took 3 SL NTG with improvement in his chest pain, though continuation of his left arm pain. In total, his chest pain lasted ~  1 hour. He has continued to note left arm pain. Vital signs have been stable. EKG as below. CXR with mild vascular congestion. CT head negative. Troponin 0.26 x 2-->0.24. He has been started on a heparin gtt per IM.   Past Medical History:  Diagnosis Date    . Acute systolic heart failure ([place])   . CAD (coronary artery disease)    10/18 ISR to the p/d LCX, and ostium of diag treated with cutting balloon. EF 35%  . Colitis   . Diabetes mellitus without complication ([place])   . Diverticulitis   . Hyperlipidemia   . Hypertension     Past Surgical History:  Procedure Laterality Date  . AORTIC VALVE REPLACEMENT    . CORONARY ARTERY BYPASS GRAFT    . JOINT REPLACEMENT Left    KNEE  . KNEE ARTHROPLASTY  2000     Home Meds: Prior to Admission medications   Medication Sig Start Date End Date Taking? Authorizing Provider  acetaminophen (TYLENOL) 325 MG tablet Take 2 tablets (650 mg total) by mouth every 6 (six) hours as needed for mild pain (or Fever >/= 101). 04/26/17  Yes Gouru, Illene Silver, MD  aspirin EC 81 MG tablet Take 162 mg by mouth daily.    Yes [provider]  atorvastatin (LIPITOR) 80 MG tablet Take 1 tablet (80 mg total) by mouth at bedtime. 04/30/17  Yes [name], NP  clopidogrel (PLAVIX) 75 MG tablet Take 75 mg by mouth daily.   Yes [provider]  digoxin (LANOXIN) 0.125 MG tablet Take 0.5 tablets (0.0625 mg total) by mouth daily. 04/26/17 04/26/18 Yes Gouru, Illene Silver, MD  isosorbide mononitrate (IMDUR) 30 MG 24 hr tablet Take 0.5 tablets (15 mg total) by mouth daily. 04/27/17  Yes Gouru, Illene Silver, MD  lisinopril (PRINIVIL,ZESTRIL) 10 MG tablet Take 10 mg by mouth at bedtime. 02/20/17  Yes [provider]  meclizine (ANTIVERT) 25 MG tablet Take 25 mg by mouth 3 (three) times daily as needed for dizziness.   Yes [provider]  metFORMIN (GLUCOPHAGE-XR) 500 MG 24 hr tablet Take 500 mg by mouth. 03/22/17 03/22/18 Yes [provider]  metoprolol succinate (TOPROL-XL) 50 MG 24 hr tablet Take 3 tablets (150 mg total) by mouth daily. Take with or immediately following a meal. 04/27/17  Yes Gouru, Aruna, MD  pantoprazole (PROTONIX) 40 MG tablet Take 1 tablet (40 mg total) by mouth daily.  04/30/17  Yes [name], NP  ranolazine (RANEXA) 500 MG 12 hr tablet Take 1 tablet (500 mg total) by mouth 2 (two) times daily. 04/26/17  Yes Gouru, Illene Silver, MD  blood glucose meter kit and supplies Dispense based on patient and insurance preference. Use 1 time daily as directed. (FOR ICD-9 250.00, 250.01). 04/30/17   [name], NP  Blood Pressure Monitoring (BLOOD PRESSURE CUFF) MISC 1 Units by Does not apply route daily. 04/30/17   [name], NP  nitroGLYCERIN (NITROSTAT) 0.4 MG SL tablet Place 1 tablet (0.4 mg total) under the tongue every 5 (five) minutes as needed for chest pain. Patient not taking: Reported on 04/30/2017 04/16/17   Wellington Hampshire, MD    Inpatient Medications: Scheduled Meds: . aspirin EC  162 mg Oral Daily  . atorvastatin  80 mg Oral QHS  . clopidogrel  75 mg Oral Daily  . digoxin  0.0625 mg Oral Daily  . insulin aspart  0-5 Units Subcutaneous QHS  . insulin aspart  0-9 Units Subcutaneous TID WC  . [  START ON 05/08/2017] isosorbide mononitrate  30 mg Oral Daily  . lisinopril  10 mg Oral QHS  . metoprolol succinate  150 mg Oral Daily  . pantoprazole  40 mg Oral Daily  . ranolazine  1,000 mg Oral BID  . sodium chloride flush  3 mL Intravenous Q12H   Continuous Infusions: . heparin 1,300 Units/hr (05/07/17 0720)   PRN Meds: acetaminophen **OR** acetaminophen, ondansetron **OR** ondansetron (ZOFRAN) IV, oxyCODONE  Allergies:   Allergies  Allergen Reactions  . Metformin And Related Other (See Comments)    Only the regular Metformin (diarrhea)    Social History:   Social History   Socioeconomic History  . Marital status: Divorced    Spouse name: Not on file  . Number of children: Not on file  . Years of education: Not on file  . Highest education level: Not on file  Social Needs  . Financial resource strain: Not on file  . Food insecurity - worry: Not on file  . Food insecurity - inability: Not on file  .  Transportation needs - medical: Not on file  . Transportation needs - non-medical: Not on file  Occupational History  . Not on file  Tobacco Use  . Smoking status: Never Smoker  . Smokeless tobacco: Never Used  Substance and Sexual Activity  . Alcohol use: No    Comment: No Alcohol 12 years, but drank 2 "bottles of liquor" each week for several years prior to quitting  . Drug use: No  . Sexual activity: Not Currently  Other Topics Concern  . Not on file  Social History Narrative  . Not on file     Family History:   Family History  Problem Relation Age of Onset  . CAD Mother   . Heart failure Mother   . Lupus Mother   . CAD Brother   . Heart attack Brother   . Prostate cancer Father   . Diabetes Sister   . Healthy Paternal Grandmother   . Prostate cancer Paternal Grandfather   . Healthy Brother   . Healthy Sister     ROS:  Review of Systems  Constitutional: Positive for diaphoresis and malaise/fatigue. Negative for chills, fever and weight loss.  HENT: Negative for congestion.   Eyes: Negative for discharge and redness.  Respiratory: Positive for shortness of breath. Negative for cough, hemoptysis, sputum production and wheezing.   Cardiovascular: Positive for chest pain. Negative for palpitations, orthopnea, claudication, leg swelling and PND.  Gastrointestinal: Positive for nausea. Negative for abdominal pain, blood in stool, heartburn, melena and vomiting.  Genitourinary: Negative for hematuria.  Musculoskeletal: Positive for myalgias. Negative for falls.       Left arm pain  Skin: Negative for rash.  Neurological: Positive for weakness. Negative for dizziness, tingling, tremors, sensory change, speech change, focal weakness and loss of consciousness.  Endo/Heme/Allergies: Does not bruise/bleed easily.  Psychiatric/Behavioral: Negative for substance abuse. The patient is not nervous/anxious.   All other systems reviewed and are negative.     Physical Exam/Data:     Vitals:   05/07/17 0200 05/07/17 0215 05/07/17 0246 05/07/17 0752  BP: (!) 143/71 140/75 133/80 113/60  Pulse: 71 69 71 68  Resp: _0 Temp:   97.9 F (36.6 C) 98 F (36.7 C)  TempSrc:   Oral Oral  SpO2: 96% 90% 96% 95%  Weight:   227 lb 8 oz (103.2 kg)   Height:   5' 10.5" (1.791 m)  Intake/Output Summary (Last 24 hours) at 05/07/2017 1147 Last data filed at 05/07/2017 1016 Gross per 24 hour  Intake 438 ml  Output 500 ml  Net -62 ml   Filed Weights   05/07/17 0015 05/07/17 0246  Weight: 230 lb (104.3 kg) 227 lb 8 oz (103.2 kg)   Body mass index is 32.18 kg/m.   Physical Exam: General: Well developed, well nourished, in no acute distress. Head: Normocephalic, atraumatic, sclera non-icteric, no xanthomas, nares without discharge. Neck: Negative for carotid bruits. JVD not elevated. Lungs: Clear bilaterally to auscultation without wheezes, rales, or rhonchi. Breathing is unlabored. Heart: Irregular with S1 S2. No murmurs, rubs, or gallops appreciated. Abdomen: Soft, non-tender, non-distended with normoactive bowel sounds. No hepatomegaly. No rebound/guarding. No obvious abdominal masses. Msk:  Strength and tone appear normal for age. Extremities: No clubbing or cyanosis. No edema. Distal pedal pulses are 2+ and equal bilaterally. Neuro: Alert and oriented X 3. No facial asymmetry. No focal deficit. Moves all extremities spontaneously. Psych:  Responds to questions appropriately with a normal affect.   EKG:  The EKG was personally reviewed and demonstrates: atrial flutter, 71 bpm, nonspecific st/t changes  Telemetry:  Telemetry was personally reviewed and demonstrates: atrial flutter, 70 bpm  Weights: Filed Weights   05/07/17 0015 05/07/17 0246  Weight: 230 lb (104.3 kg) 227 lb 8 oz (103.2 kg)    Relevant CV Studies: LHC 04/02/2017: Coronary Findings   Dominance: Right  Left Main  Vessel is angiographically normal.  Left Anterior Descending  Mid LAD  lesion, 70% stenosed.  Dist LAD lesion, 80% stenosed.  First Diagonal Branch  Ost 1st Diag to 1st Diag lesion, 95% stenosed. The lesion was previously treated.  Angioplasty: Angioplasty alone was performed using a BALLOON EUPHORA RX 2.0X12.  Supplies used: BALLOON WOLVERINE 2.00X10; BALLOON WOLVERINE 2.50X10; [place] RX2.5X12  There is a 50% residual stenosis post intervention.  1st Diag lesion, 70% stenosed.  Left Circumflex  Ost Cx to Mid Cx lesion, 90% stenosed. The lesion was previously treated.  Angioplasty: Angioplasty alone was performed using a BALLOON WOLVERINE 2.50X10.  Supplies used: BALLOON WOLVERINE 3.00X10  There is a 10% residual stenosis post intervention.  Dist Cx lesion, 70% stenosed. The lesion was previously treated.  Angioplasty: A stent was successfully placed.  There is a 10% residual stenosis post intervention.  First Obtuse Marginal Branch  Vessel is small in size. There is mild disease in the vessel.  Third Obtuse Marginal Branch  Ost 3rd Mrg lesion, 90% stenosed.  Right Coronary Artery  Prox RCA lesion, 10% stenosed. The lesion was previously treated.  Mid RCA lesion, 30% stenosed.  Right Posterior Descending Artery  Vessel is angiographically normal.  Right Posterior Atrioventricular Branch  There is mild disease in the vessel.  First Right Posterolateral  There is mild disease in the vessel.  Second Right Posterolateral  There is mild disease in the vessel.  Third Right Posterolateral  There is mild disease in the vessel.  Graft Angiography  Free LIMA Graft to Dist LAD  LIMA and is anatomically normal.  Coronary Diagrams   Diagnostic Diagram       Post-Intervention Diagram          TTE 04/02/2017: Study Conclusions  - Left ventricle: The cavity size was normal. Systolic function was moderately reduced. The estimated ejection fraction was in the range of 35% to 40%. Diffuse hypokinesis. The study was  not technically sufficient to allow evaluation of LV diastolic dysfunction due to  atrial fibrillation. - Aortic valve: A bioprosthesis sits well in the aortic position. Mobility was not restricted. There was no regurgitation. Mean gradient (S): 14 mm Hg. Peak gradient (S): 23 mm Hg. Valve area (VTI): 1.17 cm^2. Valve area (Vmax): 1.27 cm^2. Valve area (Vmean): 1.17 cm^2. - Mitral valve: There was mild regurgitation. - Right ventricle: The cavity size was mildly dilated. Wall thickness was normal. Systolic function was mildly reduced. - Tricuspid valve: There was trivial regurgitation. - Pulmonary arteries: Systolic pressure was within the normal range.  Impressions:  - When compared to the prior study from 02/25/2017 LVEF has decreased from 50-55% to 35-40%. Gradients across the bioprosthetic valve are normal. No aortic regurgitation or paravalvular leak. RV is mildly dilated with mildly decreased systolic function.   LHC 02/26/2017: Coronary Findings   Dominance: Right  Left Main  Vessel is angiographically normal.  Left Anterior Descending  Mid LAD lesion, 70% stenosed.  Dist LAD lesion, 100% stenosed.  First Diagonal Branch  Ost 1st Diag to 1st Diag lesion, 95% stenosed. The lesion was previously treated.  1st Diag lesion, 70% stenosed.  Left Circumflex  Prox Cx lesion, 70% stenosed. The lesion was previously treated.  Angioplasty: Lesion crossed with guidewire. Angioplasty alone was performed. The pre-interventional distal flow is normal (TIMI 3). The post-interventional distal flow is normal (TIMI 3). The intervention was successful . No complications occurred at this lesion.  There is a 15% residual stenosis post intervention.  Dist Cx lesion, 95% stenosed. The lesion was previously treated.  Angioplasty: Angioplasty alone was performed. The pre-interventional distal flow is normal (TIMI 3). The post-interventional distal flow is normal (TIMI  3). The intervention was successful . No complications occurred at this lesion.  There is a 30% residual stenosis post intervention.  First Obtuse Marginal Branch  Vessel is small in size. There is mild disease in the vessel.  Third Obtuse Marginal Branch  Ost 3rd Mrg lesion, 90% stenosed.  Right Coronary Artery  Prox RCA lesion, 10% stenosed. The lesion was previously treated.  Mid RCA lesion, 30% stenosed.  Right Posterior Descending Artery  Vessel is angiographically normal.  Right Posterior Atrioventricular Branch  There is mild disease in the vessel.  First Right Posterolateral  There is mild disease in the vessel.  Second Right Posterolateral  There is mild disease in the vessel.  Third Right Posterolateral  There is mild disease in the vessel.  Graft Angiography  Free LIMA Graft to Dist LAD  LIMA and is anatomically normal.  Coronary Diagrams   Diagnostic Diagram       Post-Intervention Diagram         Lexiscan Myoview 04/24/2017:  Abnormal, potentially high risk pharmacologic myocardial perfusion stress test.  There is a small in size, moderate in severity, reversible defect involving the apical anterior and apical lateral segments consistent with ischemia.  There is a small in size, mild in severity, reversible defect involving the mid anterolateral segment consistent with ischemia.  There is a small in size, severe, fixed defect involving the apical inferior and apical segments consistent with scar.  The left ventricular ejection fraction is mildly decreased (46%).  EKG demonstrates subtle inferior ST-segment elevation and atrial fibrillation with rapid ventricular response immediately following administration of regadenoson.   Laboratory Data:  Chemistry Recent Labs  Lab 05/07/17 0015 05/07/17 0858  NA 134* 134*  K 3.9 3.8  CL 98* 99*  CO2 25 26  GLUCOSE 319* 258*  BUN 18 17  CREATININE 0.84 0.72  CALCIUM 9.2 9.0  GFRNONAA >60 >60  GFRAA  >60 >60  ANIONGAP 11 9    No results for input(s): PROT, ALBUMIN, AST, ALT, ALKPHOS, BILITOT in the last 168 hours. Hematology Recent Labs  Lab 05/07/17 0015 05/07/17 0858  WBC 9.3 8.8  RBC 4.65 4.73  HGB 12.8* 13.0  HCT 37.9* 38.5*  MCV 81.5 81.5  MCH 27.5 27.5  MCHC 33.7 33.7  RDW 14.7* 14.9*  PLT 228 211   Cardiac Enzymes Recent Labs  Lab 05/07/17 0015 05/07/17 0302 05/07/17 0858  TROPONINI 0.26* 0.26* 0.24*   No results for input(s): TROPIPOC in the last 168 hours.  BNPNo results for input(s): BNP, PROBNP in the last 168 hours.  DDimer No results for input(s): DDIMER in the last 168 hours.  Radiology/Studies:  Dg Chest 2 View  Result Date: 05/07/2017 IMPRESSION: Cardiac enlargement with mild vascular congestion. No edema or consolidation. Electronically Signed   By: Lucienne Capers M.D.   On: 05/07/2017 01:07   Ct Head Wo Contrast  Result Date: 05/07/2017 IMPRESSION: No acute intracranial abnormalities. Electronically Signed   By: Lucienne Capers M.D.   On: 05/07/2017 02:21    Assessment and Plan:   1. Unstable angina/CAD: -Troponin is mildly elevated, more than prior, though relatively flat trending -He does have known CAD as detailed above -Prior episode of unstable angina was felt to be in the setting of tachycardic heart rates -This current episode has been in the setting of controlled heart rate -Discussed with MD, planning for LHC on 11/6 -Continue heparin gtt  -DAPT with ASA and Plavix -Increase Imdur to 30 mg daily -Increase Ranexa to 1000 mg bid -Continue Toprol and Lipitor -Risks and benefits of cardiac catheterization have been discussed with the patient including risks of bleeding, bruising, infection, kidney damage, stroke, heart attack, and death. The patient understands these risks and is willing to proceed with the procedure. All questions have been answered and concerns listened to  2. Persistent Afib/flutter: -Rate well  controlled -Continue rate control with Toprol and digoxin -Not on full dose anticoagulation given recurrent and recent GI bleed as well as in the setting of dual antiplatelet therapy as above  3. ICM/chronic systolic CHF: -He does not appear to be volume up at this time -Continue Toprol and lisinopril  -Consider adding spironolactone prior to discharge  -Recent echo as above, no need to repeat at this time -CHF education   4. History of bioprosthetic aortic valve: -Stable by recent echo in 04/2017 -Outpatient follow up  5. HTN: -Stable -Continue current medications      For questions or updates, please contact [place] Please consult [email or web address] for contact info under Cardiology/STEMI.   Signed, Christell Faith, PA-C [place] Pager: [phone] 05/07/2017, 11:47 AM

## 2017-05-07 NOTE — ED Notes (Signed)
Pt transported to CT at this time.

## 2017-05-07 NOTE — Progress Notes (Signed)
ANTICOAGULATION CONSULT NOTE - Follow Up Consult  Pharmacy Consult for Heparin drip Indication: chest pain/ACS  Allergies  Allergen Reactions  . Metformin And Related Other (See Comments)    Only the regular Metformin (diarrhea)    Patient Measurements: Height: 5' 10.5" (179.1 cm) Weight: 227 lb 8 oz (103.2 kg) IBW/kg (Calculated) : 74.15 Heparin Dosing Weight: 95.8 kg  Vital Signs: Temp: 98 F (36.7 C) (11/05 0752) Temp Source: Oral (11/05 0752) BP: 113/60 (11/05 0752) Pulse Rate: 68 (11/05 0752)  Labs: Recent Labs    05/07/17 0015 05/07/17 0302 05/07/17 0858  HGB 12.8*  --  13.0  HCT 37.9*  --  38.5*  PLT 228  --  211  APTT 30  --   --   LABPROT 15.2  --   --   INR 1.21  --   --   HEPARINUNFRC  --   --  0.67  CREATININE 0.84  --  0.72  TROPONINI 0.26* 0.26* 0.24*    Estimated Creatinine Clearance: 113.2 mL/min (by C-G formula based on SCr of 0.72 mg/dL).   Medications:  Scheduled:  . aspirin EC  162 mg Oral Daily  . atorvastatin  80 mg Oral QHS  . clopidogrel  75 mg Oral Daily  . digoxin  0.0625 mg Oral Daily  . insulin aspart  0-5 Units Subcutaneous QHS  . insulin aspart  0-9 Units Subcutaneous TID WC  . [START ON 05/08/2017] isosorbide mononitrate  30 mg Oral Daily  . lisinopril  10 mg Oral QHS  . metoprolol succinate  150 mg Oral Daily  . pantoprazole  40 mg Oral Daily  . ranolazine  1,000 mg Oral BID  . sodium chloride flush  3 mL Intravenous Q12H   Infusions:  . heparin 1,300 Units/hr (05/07/17 0720)    Assessment: 66 yo M on Heparin drip for ACS/NSTEMI. Hx CABG, multiple catheterizations. Patient on ASA, plavix PTA per Med Rec.  Goal of Therapy:  Heparin level 0.3-0.7 units/ml Monitor platelets by anticoagulation protocol: Yes   Plan:  Currently on Heparin drip at 1300 units/hr. 11/5 HL@ 0858= 0.67.  Will continue current rate and f/u with confirmatory heparin level in 6 hours.    Tanairi Cypert A 05/07/2017,11:09 AM

## 2017-05-07 NOTE — H&P (Signed)
[facility] NAME: Christopher Cummings    MR#:  [id number]  DATE OF BIRTH:  September 03, 1950  DATE OF ADMISSION:  05/07/2017  PRIMARY CARE PHYSICIAN: [name], NP   REQUESTING/REFERRING PHYSICIAN: Dahlia Client, MD  CHIEF COMPLAINT:   Chief Complaint  Patient presents with  . Chest Pain    HISTORY OF PRESENT ILLNESS:  Christopher Cummings  is a 66 y.o. male who presents with left-sided chest discomfort and left arm pain.  This pain started today, and has been persistent since onset.  He is taken multiple doses of sublingual nitroglycerin today with no real improvement in his symptoms.  Patient has a significant history of cardiac disease including prior CABG and multiple catheterizations with stent placement.  He was recently admitted here about a month ago for unstable angina.  His symptoms tonight are fairly consistent with his known profile of cardiac symptoms.  Here in the ED his initial troponin was elevated at 0.26.  Hospitalist were called for admission and further evaluation and treatment  PAST MEDICAL HISTORY:   Past Medical History:  Diagnosis Date  . Acute systolic heart failure ([place])   . CAD (coronary artery disease)    10/18 ISR to the p/d LCX, and ostium of diag treated with cutting balloon. EF 35%  . Colitis   . Diabetes mellitus without complication ([place])   . Diverticulitis   . Hyperlipidemia   . Hypertension     PAST SURGICAL HISTORY:   Past Surgical History:  Procedure Laterality Date  . AORTIC VALVE REPLACEMENT    . CORONARY ARTERY BYPASS GRAFT    . JOINT REPLACEMENT Left    KNEE  . KNEE ARTHROPLASTY  2000    SOCIAL HISTORY:   Social History   Tobacco Use  . Smoking status: Never Smoker  . Smokeless tobacco: Never Used  Substance Use Topics  . Alcohol use: No    Comment: No Alcohol 12 years, but drank 2 "bottles of liquor" each week for several years prior to quitting    FAMILY HISTORY:    Family History  Problem Relation Age of Onset  . CAD Mother   . Heart failure Mother   . Lupus Mother   . CAD Brother   . Heart attack Brother   . Prostate cancer Father   . Diabetes Sister   . Healthy Paternal Grandmother   . Prostate cancer Paternal Grandfather   . Healthy Brother   . Healthy Sister     DRUG ALLERGIES:   Allergies  Allergen Reactions  . Metformin And Related Other (See Comments)    Only the regular Metformin (diarrhea)    MEDICATIONS AT HOME:   Prior to Admission medications   Medication Sig Start Date End Date Taking? Authorizing Provider  acetaminophen (TYLENOL) 325 MG tablet Take 2 tablets (650 mg total) by mouth every 6 (six) hours as needed for mild pain (or Fever >/= 101). 04/26/17  Yes Gouru, Illene Silver, MD  aspirin EC 81 MG tablet Take 162 mg by mouth daily.    Yes [provider]  atorvastatin (LIPITOR) 80 MG tablet Take 1 tablet (80 mg total) by mouth at bedtime. 04/30/17  Yes [name], NP  clopidogrel (PLAVIX) 75 MG tablet Take 75 mg by mouth daily.   Yes [provider]  digoxin (LANOXIN) 0.125 MG tablet Take 0.5 tablets (0.0625 mg total) by mouth daily. 04/26/17 04/26/18 Yes Gouru, Illene Silver, MD  isosorbide mononitrate (IMDUR) 30  MG 24 hr tablet Take 0.5 tablets (15 mg total) by mouth daily. 04/27/17  Yes Gouru, Illene Silver, MD  lisinopril (PRINIVIL,ZESTRIL) 10 MG tablet Take 10 mg by mouth at bedtime. 02/20/17  Yes [provider]  meclizine (ANTIVERT) 25 MG tablet Take 25 mg by mouth 3 (three) times daily as needed for dizziness.   Yes [provider]  metFORMIN (GLUCOPHAGE-XR) 500 MG 24 hr tablet Take 500 mg by mouth. 03/22/17 03/22/18 Yes [provider]  metoprolol succinate (TOPROL-XL) 50 MG 24 hr tablet Take 3 tablets (150 mg total) by mouth daily. Take with or immediately following a meal. 04/27/17  Yes Gouru, Aruna, MD  pantoprazole (PROTONIX) 40 MG tablet Take 1 tablet (40 mg total) by mouth  daily. 04/30/17  Yes [name], NP  ranolazine (RANEXA) 500 MG 12 hr tablet Take 1 tablet (500 mg total) by mouth 2 (two) times daily. 04/26/17  Yes Gouru, Illene Silver, MD  blood glucose meter kit and supplies Dispense based on patient and insurance preference. Use 1 time daily as directed. (FOR ICD-9 250.00, 250.01). 04/30/17   [name], NP  Blood Pressure Monitoring (BLOOD PRESSURE CUFF) MISC 1 Units by Does not apply route daily. 04/30/17   [name], NP  nitroGLYCERIN (NITROSTAT) 0.4 MG SL tablet Place 1 tablet (0.4 mg total) under the tongue every 5 (five) minutes as needed for chest pain. Patient not taking: Reported on 04/30/2017 04/16/17   Wellington Hampshire, MD    REVIEW OF SYSTEMS:  Review of Systems  Constitutional: Negative for chills, fever, malaise/fatigue and weight loss.  HENT: Negative for ear pain, hearing loss and tinnitus.   Eyes: Negative for blurred vision, double vision, pain and redness.  Respiratory: Negative for cough, hemoptysis and shortness of breath.   Cardiovascular: Positive for chest pain. Negative for palpitations, orthopnea and leg swelling.  Gastrointestinal: Negative for abdominal pain, constipation, diarrhea, nausea and vomiting.  Genitourinary: Negative for dysuria, frequency and hematuria.  Musculoskeletal: Negative for back pain, joint pain and neck pain.  Skin:       No acne, rash, or lesions  Neurological: Negative for dizziness, tremors, focal weakness and weakness.  Endo/Heme/Allergies: Negative for polydipsia. Does not bruise/bleed easily.  Psychiatric/Behavioral: Negative for depression. The patient is not nervous/anxious and does not have insomnia.      VITAL SIGNS:   Vitals:   05/07/17 0015 05/07/17 0030 05/07/17 0045 05/07/17 0100  BP: (!) 146/76 (!) 123/58 138/71 (!) 131/59  Pulse: 71 71 71 71  Resp: _0 Temp:      TempSrc:      SpO2: 97% 96% 94% 95%  Weight: 104.3 kg (230 lb)     Height: 5'  10.5" (1.791 m)      Wt Readings from Last 3 Encounters:  05/07/17 104.3 kg (230 lb)  04/30/17 104.6 kg (230 lb 9.6 oz)  04/26/17 103 kg (227 lb)    PHYSICAL EXAMINATION:  Physical Exam  Vitals reviewed. Constitutional: He is oriented to person, place, and time. He appears well-developed and well-nourished. No distress.  HENT:  Head: Normocephalic and atraumatic.  Mouth/Throat: Oropharynx is clear and moist.  Eyes: Conjunctivae and EOM are normal. Pupils are equal, round, and reactive to light. No scleral icterus.  Neck: Normal range of motion. Neck supple. No JVD present. No thyromegaly present.  Cardiovascular: Normal rate, regular rhythm and intact distal pulses. Exam reveals no gallop and no friction rub.  No murmur heard. Respiratory: Effort  normal and breath sounds normal. No respiratory distress. He has no wheezes. He has no rales.  GI: Soft. Bowel sounds are normal. He exhibits no distension. There is no tenderness.  Musculoskeletal: Normal range of motion. He exhibits no edema.  No arthritis, no gout  Lymphadenopathy:    He has no cervical adenopathy.  Neurological: He is alert and oriented to person, place, and time. No cranial nerve deficit.  No dysarthria, no aphasia  Skin: Skin is warm and dry. No rash noted. No erythema.  Psychiatric: He has a normal mood and affect. His behavior is normal. Judgment and thought content normal.    LABORATORY PANEL:   CBC Recent Labs  Lab 05/07/17 0015  WBC 9.3  HGB 12.8*  HCT 37.9*  PLT 228   ------------------------------------------------------------------------------------------------------------------  Chemistries  Recent Labs  Lab 05/07/17 0015  NA 134*  K 3.9  CL 98*  CO2 25  GLUCOSE 319*  BUN 18  CREATININE 0.84  CALCIUM 9.2   ------------------------------------------------------------------------------------------------------------------  Cardiac Enzymes Recent Labs  Lab 05/07/17 0015  TROPONINI  0.26*   ------------------------------------------------------------------------------------------------------------------  RADIOLOGY:  Dg Chest 2 View  Result Date: 05/07/2017 CLINICAL DATA:  Chest pain starting yesterday. Numbness to the left side of the chest. EXAM: CHEST  2 VIEW COMPARISON:  04/23/2017 FINDINGS: Postoperative changes in the mediastinum. Cardiac enlargement. Mild central vascular congestion. No edema or consolidation. Similar appearance to previous study. No pleural effusions. No pneumothorax. Mediastinal contours appear intact. Degenerative changes in the spine. IMPRESSION: Cardiac enlargement with mild vascular congestion. No edema or consolidation. Electronically Signed   By: Lucienne Capers M.D.   On: 05/07/2017 01:07    EKG:   Orders placed or performed during the hospital encounter of 05/07/17  . ED EKG within 10 minutes  . ED EKG within 10 minutes    IMPRESSION AND PLAN:  Principal Problem:   NSTEMI (non-ST elevated myocardial infarction) [facility]) -patient recently had abnormal stress test and was considered for medical management by cardiology earlier last month.  Heparin drip ordered, cycle cardiac enzymes, cardiology consult Active Problems:   HTN (hypertension) -continue home meds   Diabetes ([place]) -sliding scale insulin with corresponding glucose checks   CAD (coronary artery disease) -continue home meds, other workup as above  All the records are reviewed and case discussed with ED provider. Management plans discussed with the patient and/or family.  DVT PROPHYLAXIS: Systemic anticoagulation  GI PROPHYLAXIS: None  ADMISSION STATUS: Inpatient  CODE STATUS: Full Code Status History    Date Active Date Inactive Code Status Order ID Comments User Context   04/24/2017 00:35 04/26/2017 18:44 Full Code [id number]  Lance Coon, MD Inpatient   03/31/2017 20:25 04/03/2017 18:53 Full Code [id number]  Flossie Dibble, MD Inpatient   03/13/2017 01:41 03/14/2017  15:32 Full Code [id number]  Lance Coon, MD Inpatient   02/24/2017 04:20 02/27/2017 14:10 Full Code [id number]  Harrie Foreman, MD Inpatient      TOTAL TIME TAKING CARE OF THIS PATIENT: 45 minutes.   WILLIS, DAVID FIELDING 05/07/2017, 1:39 AM  [employer]  [phone]  CC: Primary care physician; [name], NP  Note:  This document was prepared using Dragon voice recognition software and may include unintentional dictation errors.

## 2017-05-07 NOTE — Progress Notes (Signed)
ANTICOAGULATION CONSULT NOTE - Follow Up Consult  Pharmacy Consult for Heparin drip Indication: chest pain/ACS  Allergies  Allergen Reactions  . Metformin And Related Other (See Comments)    Only the regular Metformin (diarrhea)    Patient Measurements: Height: 5' 10.5" (179.1 cm) Weight: 227 lb 8 oz (103.2 kg) IBW/kg (Calculated) : 74.15 Heparin Dosing Weight: 95.8 kg  Vital Signs: Temp: 98.2 F (36.8 C) (11/05 1525) Temp Source: Oral (11/05 1525) BP: 126/59 (11/05 1525) Pulse Rate: 62 (11/05 1525)  Labs: Recent Labs    05/07/17 0015 05/07/17 0302 05/07/17 0858 05/07/17 1507  HGB 12.8*  --  13.0  --   HCT 37.9*  --  38.5*  --   PLT 228  --  211  --   APTT 30  --   --   --   LABPROT 15.2  --   --   --   INR 1.21  --   --   --   HEPARINUNFRC  --   --  0.67 0.60  CREATININE 0.84  --  0.72  --   TROPONINI 0.26* 0.26* 0.24* 0.21*    Estimated Creatinine Clearance: 113.2 mL/min (by C-G formula based on SCr of 0.72 mg/dL).   Medications:  Scheduled:  . aspirin EC  162 mg Oral Daily  . atorvastatin  80 mg Oral QHS  . clopidogrel  75 mg Oral Daily  . digoxin  0.0625 mg Oral Daily  . insulin aspart  0-5 Units Subcutaneous QHS  . insulin aspart  0-9 Units Subcutaneous TID WC  . insulin glargine  10 Units Subcutaneous Daily  . [START ON 05/08/2017] isosorbide mononitrate  30 mg Oral Daily  . lisinopril  10 mg Oral QHS  . metoprolol succinate  150 mg Oral Daily  . pantoprazole  40 mg Oral Daily  . ranolazine  1,000 mg Oral BID  . sodium chloride flush  3 mL Intravenous Q12H   Infusions:  . [START ON 05/08/2017] sodium chloride    . heparin 1,300 Units/hr (05/07/17 0720)    Assessment: 66 yo M on Heparin drip for ACS/NSTEMI. Hx CABG, multiple catheterizations. Patient on ASA, plavix PTA per Med Rec.  Goal of Therapy:  Heparin level 0.3-0.7 units/ml Monitor platelets by anticoagulation protocol: Yes   Plan:  Currently on Heparin drip at 1300 units/hr. 11/5  HL@ 0858= 0.67.  Will continue current rate and f/u with confirmatory heparin level in 6 hours.   11/5:  HL @ 15:07 = 0.6.  Will continue this pt at current rate and recheck HL on 11/6 with AM labs.    Margy Sumler D 05/07/2017,3:54 PM

## 2017-05-07 NOTE — ED Provider Notes (Addendum)
[facility] Emergency Department Provider Note   ____________________________________________   First MD Initiated Contact with Patient 05/07/17 0017     (approximate)  I have reviewed the triage vital signs and the nursing notes.   HISTORY  Chief Complaint Chest Pain    HPI Christopher Koskinen Sr. is a 66 y.o. male who comes into the hospital today with some left arm pain and chest numbness. The patient states that earlier today around 10 AM he had some numbness in his left arm. He took a nitroglycerin and the pain went away. The patient states that the numbness came back later in the day and he took another nitroglycerin and again the numbness went away. Later in this evening he started having some left arm pain with some chest numbness. The patient states he took 3 nitroglycerin but the pain did not go away. He contacted his cardiologist's office and was told to come in to get checked out. The patient has a history of coronary artery bypass graft 2 vessels with multiple stents. He reports that all of his heart attacks have been different. The patient states that his pain is a 5 out of 10 in intensity currently. He took 4 baby aspirin. He had no shortness of breath no chills no nausea no vomiting no dizziness or lightheadedness. The patient is here today for evaluation.   Past Medical History:  Diagnosis Date  . Acute systolic heart failure ([place])   . CAD (coronary artery disease)    10/18 ISR to the p/d LCX, and ostium of diag treated with cutting balloon. EF 35%  . Colitis   . Diabetes mellitus without complication ([place])   . Diverticulitis   . Hyperlipidemia   . Hypertension     Patient Active Problem List   Diagnosis Date Noted  . NSTEMI (non-ST elevated myocardial infarction) ([place]) 05/07/2017  . Acute systolic heart failure ([place]) 04/03/2017  . H/O prosthetic aortic valve replacement 04/01/2017  . Unstable angina ([place]) 03/31/2017  . HTN  (hypertension) 03/12/2017  . Diabetes ([place]) 03/12/2017  . CAD (coronary artery disease) 03/12/2017  . History of non-ST elevation myocardial infarction (NSTEMI) 02/24/2017    Past Surgical History:  Procedure Laterality Date  . AORTIC VALVE REPLACEMENT    . CORONARY ARTERY BYPASS GRAFT    . JOINT REPLACEMENT Left    KNEE  . KNEE ARTHROPLASTY  2000    Prior to Admission medications   Medication Sig Start Date End Date Taking? Authorizing Provider  acetaminophen (TYLENOL) 325 MG tablet Take 2 tablets (650 mg total) by mouth every 6 (six) hours as needed for mild pain (or Fever >/= 101). 04/26/17  Yes Gouru, Illene Silver, MD  aspirin EC 81 MG tablet Take 162 mg by mouth daily.    Yes [provider]  atorvastatin (LIPITOR) 80 MG tablet Take 1 tablet (80 mg total) by mouth at bedtime. 04/30/17  Yes [name], NP  clopidogrel (PLAVIX) 75 MG tablet Take 75 mg by mouth daily.   Yes [provider]  digoxin (LANOXIN) 0.125 MG tablet Take 0.5 tablets (0.0625 mg total) by mouth daily. 04/26/17 04/26/18 Yes Gouru, Illene Silver, MD  isosorbide mononitrate (IMDUR) 30 MG 24 hr tablet Take 0.5 tablets (15 mg total) by mouth daily. 04/27/17  Yes Gouru, Illene Silver, MD  lisinopril (PRINIVIL,ZESTRIL) 10 MG tablet Take 10 mg by mouth at bedtime. 02/20/17  Yes [provider]  meclizine (ANTIVERT) 25 MG tablet Take 25 mg by mouth 3 (three) times  daily as needed for dizziness.   Yes [provider]  metFORMIN (GLUCOPHAGE-XR) 500 MG 24 hr tablet Take 500 mg by mouth. 03/22/17 03/22/18 Yes [provider]  metoprolol succinate (TOPROL-XL) 50 MG 24 hr tablet Take 3 tablets (150 mg total) by mouth daily. Take with or immediately following a meal. 04/27/17  Yes Gouru, Aruna, MD  pantoprazole (PROTONIX) 40 MG tablet Take 1 tablet (40 mg total) by mouth daily. 04/30/17  Yes [name], NP  ranolazine (RANEXA) 500 MG 12 hr tablet Take 1 tablet (500 mg total) by mouth 2  (two) times daily. 04/26/17  Yes Gouru, Illene Silver, MD  blood glucose meter kit and supplies Dispense based on patient and insurance preference. Use 1 time daily as directed. (FOR ICD-9 250.00, 250.01). 04/30/17   [name], NP  Blood Pressure Monitoring (BLOOD PRESSURE CUFF) MISC 1 Units by Does not apply route daily. 04/30/17   [name], NP  nitroGLYCERIN (NITROSTAT) 0.4 MG SL tablet Place 1 tablet (0.4 mg total) under the tongue every 5 (five) minutes as needed for chest pain. Patient not taking: Reported on 04/30/2017 04/16/17   Wellington Hampshire, MD    Allergies Metformin and related  Family History  Problem Relation Age of Onset  . CAD Mother   . Heart failure Mother   . Lupus Mother   . CAD Brother   . Heart attack Brother   . Prostate cancer Father   . Diabetes Sister   . Healthy Paternal Grandmother   . Prostate cancer Paternal Grandfather   . Healthy Brother   . Healthy Sister     Social History Social History   Tobacco Use  . Smoking status: Never Smoker  . Smokeless tobacco: Never Used  Substance Use Topics  . Alcohol use: No    Comment: No Alcohol 12 years, but drank 2 "bottles of liquor" each week for several years prior to quitting  . Drug use: No    Review of Systems  Constitutional: No fever/chills Eyes: No visual changes. ENT: No sore throat. Cardiovascular: chest numbness Respiratory: Denies shortness of breath. Gastrointestinal: No abdominal pain.  No nausea, no vomiting.  No diarrhea.  No constipation. Genitourinary: Negative for dysuria. Musculoskeletal: left arm pain Skin: Negative for rash. Neurological: Negative for headaches, focal weakness or numbness.   ____________________________________________   PHYSICAL EXAM:  VITAL SIGNS: ED Triage Vitals  Enc Vitals Group     BP 05/07/17 0014 (!) 142/74     Pulse Rate 05/07/17 0014 72     Resp 05/07/17 0014 (!) 24     Temp 05/07/17 0014 98.7 F (37.1 C)     Temp  Source 05/07/17 0014 Oral     SpO2 05/07/17 0014 97 %     Weight 05/07/17 0015 230 lb (104.3 kg)     Height 05/07/17 0015 5' 10.5" (1.791 m)     Head Circumference --      Peak Flow --      Pain Score 05/07/17 0013 5     Pain Loc --      Pain Edu? --      Excl. in [place]? --     Constitutional: Alert and oriented. Well appearing and in mild distress. Eyes: Conjunctivae are normal. PERRL. EOMI. Head: Atraumatic. Nose: No congestion/rhinnorhea. Mouth/Throat: Mucous membranes are moist.  Oropharynx non-erythematous. Cardiovascular: Normal rate, regular rhythm. auscultated murmur  Good peripheral circulation. Respiratory: Normal respiratory effort.  No retractions. Lungs CTAB. Gastrointestinal: Soft and  nontender. No distention. positive bowel sounds Musculoskeletal: No lower extremity tenderness nor edema.   Neurologic:  Normal speech and language.  Skin:  Skin is warm, dry and intact.  Psychiatric: Mood and affect are normal.   ____________________________________________   LABS (all labs ordered are listed, but only abnormal results are displayed)  Labs Reviewed  BASIC METABOLIC PANEL - Abnormal; Notable for the following components:      Result Value   Sodium 134 (*)    Chloride 98 (*)    Glucose, Bld 319 (*)    All other components within normal limits  CBC - Abnormal; Notable for the following components:   Hemoglobin 12.8 (*)    HCT 37.9 (*)    RDW 14.7 (*)    All other components within normal limits  TROPONIN I - Abnormal; Notable for the following components:   Troponin I 0.26 (*)    All other components within normal limits  APTT  PROTIME-INR   ____________________________________________  EKG  ED ECG REPORT I, Loney Hering, the attending physician, personally viewed and interpreted this ECG.   Date: 05/07/2017  EKG Time: 0014  Rate: 71  Rhythm: normal sinus rhythm  Axis: normal  Intervals:none  ST&T Change:  none  ____________________________________________  RADIOLOGY  Dg Chest 2 View  Result Date: 05/07/2017 CLINICAL DATA:  Chest pain starting yesterday. Numbness to the left side of the chest. EXAM: CHEST  2 VIEW COMPARISON:  04/23/2017 FINDINGS: Postoperative changes in the mediastinum. Cardiac enlargement. Mild central vascular congestion. No edema or consolidation. Similar appearance to previous study. No pleural effusions. No pneumothorax. Mediastinal contours appear intact. Degenerative changes in the spine. IMPRESSION: Cardiac enlargement with mild vascular congestion. No edema or consolidation. Electronically Signed   By: Lucienne Capers M.D.   On: 05/07/2017 01:07   Ct Head Wo Contrast  Result Date: 05/07/2017 CLINICAL DATA:  Numbness and tingling. Paresthesias. Chest pain yesterday. Numbness in the left side of the chest. Relief with nitroglycerin. EXAM: CT HEAD WITHOUT CONTRAST TECHNIQUE: Contiguous axial images were obtained from the base of the skull through the vertex without intravenous contrast. COMPARISON:  None. FINDINGS: Brain: Mild cerebral atrophy. No evidence of acute infarction, hemorrhage, hydrocephalus, extra-axial collection or mass lesion/mass effect. Vascular: No hyperdense vessel or unexpected calcification. Skull: Normal. Negative for fracture or focal lesion. Sinuses/Orbits: No acute finding. Other: None. IMPRESSION: No acute intracranial abnormalities. Electronically Signed   By: Lucienne Capers M.D.   On: 05/07/2017 02:21    ____________________________________________   PROCEDURES  Procedure(s) performed: None  Procedures  Critical Care performed: No  ____________________________________________   INITIAL IMPRESSION / ASSESSMENT AND PLAN / ED COURSE  As part of my medical decision making, I reviewed the following data within the electronic MEDICAL RECORD NUMBER Notes from prior ED visits and [place] Controlled Substance Database   This is a 66 year old man  who comes into the hospital today with some arm pain and chest numbness. The patient has a history of coronary artery disease with a bypass and multiple stents in the past.  My differential diagnosis includes unstable angina, non-ST segment elevation MI, nonspecific chest pain.  We did send the patient for some blood work. His troponin came back elevated at 0.26. The patient's chest x-ray showed some cardiac enlargement with mild vascular congestion. The patient did receive a CT scan of his brain as well because he complained of numbness in his leg. The patient's CT appeared unremarkable. He did take aspirin at home and  he is on Eliquis. I will admit the patient to the hospitalist service for further evaluation of his arm pain and chest numbness. It appears that the patient may be having an non-ST segment elevation MI.      ____________________________________________   FINAL CLINICAL IMPRESSION(S) / ED DIAGNOSES  Final diagnoses:  Unstable angina pectoris (HCC)  NSTEMI (non-ST elevated myocardial infarction) [facility])      Note:  This document was prepared using Dragon voice recognition software and may include unintentional dictation errors.    Loney Hering, MD 05/07/17 [id number]    Loney Hering, MD 05/07/17 [phone]

## 2017-05-07 NOTE — ED Triage Notes (Signed)
Pt brought into [facility] from home.  Pt has hx of stents, CABG, CHF, and DM.  Pt told EMS that he started having CP yesterday (05/06/17) morning that felt like numbness to L side of chest.  Pt took 1 NTG at 10am and pain was relieved. Throughout day, pain became recurrent and pain started to radiate down L arm.  Pt took total of 5 NTG throughout day, NTG relieved CP, but not L arm pain.  Pt was admitted to this hospital on Apr 23, 2017 and was started on Imdur and Digoxin at discharge.  Pt is A&Ox4, in NAD.  Pt took 324 aspirin before EMS arrival.  EMS gave 1NTG in route.  Pt states CP is gone, but L arm pain persists.  Pt denies N/V.

## 2017-05-07 NOTE — ED Notes (Signed)
Date and time results received: 05/07/17 0045 Test: Troponin Critical Value: 0.26  Name of Provider Notified: Dr. Zenda AlpersWebster  Orders Received? Or Actions Taken?: Acknowledged

## 2017-05-07 NOTE — Progress Notes (Signed)
[employer] [facility] - [place] [facility]   PATIENT NAME: Christopher Cummings    MR#:  [id number]  DATE OF BIRTH:  01/09/1951  SUBJECTIVE:  CHIEF COMPLAINT:   Chief Complaint  Patient presents with  . Chest Pain   - feels miserable due to left arm pain- started yesterday. Troponin elevated - possible cath tomorrow. Denies chest pain today  REVIEW OF SYSTEMS:  Review of Systems  Constitutional: Negative for chills, fever and malaise/fatigue.  HENT: Negative for congestion, ear discharge, hearing loss and nosebleeds.   Eyes: Negative for blurred vision and double vision.  Respiratory: Negative for cough, shortness of breath and wheezing.   Cardiovascular: Positive for chest pain. Negative for palpitations and leg swelling.  Gastrointestinal: Negative for abdominal pain, constipation, diarrhea, nausea and vomiting.  Genitourinary: Negative for dysuria and urgency.  Musculoskeletal: Positive for myalgias.  Neurological: Negative for dizziness, speech change, focal weakness, seizures and headaches.  Psychiatric/Behavioral: Negative for depression.    DRUG ALLERGIES:   Allergies  Allergen Reactions  . Metformin And Related Other (See Comments)    Only the regular Metformin (diarrhea)    VITALS:  Blood pressure 113/60, pulse 68, temperature 98 F (36.7 C), temperature source Oral, resp. rate 16, height 5' 10.5" (1.791 m), weight 103.2 kg (227 lb 8 oz), SpO2 95 %.  PHYSICAL EXAMINATION:  Physical Exam  GENERAL:  66 y.o.-year-old patient lying in the bed with no acute distress.  EYES: Pupils equal, round, reactive to light and accommodation. No scleral icterus. Extraocular muscles intact.  HEENT: Head atraumatic, normocephalic. Oropharynx and nasopharynx clear.  NECK:  Supple, no jugular venous distention. No thyroid enlargement, no tenderness.  LUNGS: Normal breath sounds bilaterally, no wheezing, rales,rhonchi or crepitation. No use of accessory muscles of  respiration.  CARDIOVASCULAR: S1, S2 normal. No   rubs, or gallops. 3/6 systolic murmur present ABDOMEN: Soft, nontender, nondistended. Bowel sounds present. No organomegaly or mass.  EXTREMITIES: No pedal edema, cyanosis, or clubbing.  NEUROLOGIC: Cranial nerves II through XII are intact. Muscle strength 5/5 in all extremities. Sensation intact. Gait not checked.  PSYCHIATRIC: The patient is alert and oriented x 3.  SKIN: No obvious rash, lesion, or ulcer.    LABORATORY PANEL:   CBC Recent Labs  Lab 05/07/17 0858  WBC 8.8  HGB 13.0  HCT 38.5*  PLT 211   ------------------------------------------------------------------------------------------------------------------  Chemistries  Recent Labs  Lab 05/07/17 0858  NA 134*  K 3.8  CL 99*  CO2 26  GLUCOSE 258*  BUN 17  CREATININE 0.72  CALCIUM 9.0   ------------------------------------------------------------------------------------------------------------------  Cardiac Enzymes Recent Labs  Lab 05/07/17 0858  TROPONINI 0.24*   ------------------------------------------------------------------------------------------------------------------  RADIOLOGY:  Dg Chest 2 View  Result Date: 05/07/2017 CLINICAL DATA:  Chest pain starting yesterday. Numbness to the left side of the chest. EXAM: CHEST  2 VIEW COMPARISON:  04/23/2017 FINDINGS: Postoperative changes in the mediastinum. Cardiac enlargement. Mild central vascular congestion. No edema or consolidation. Similar appearance to previous study. No pleural effusions. No pneumothorax. Mediastinal contours appear intact. Degenerative changes in the spine. IMPRESSION: Cardiac enlargement with mild vascular congestion. No edema or consolidation. Electronically Signed   By: Burman Nieves M.D.   On: 05/07/2017 01:07   Ct Head Wo Contrast  Result Date: 05/07/2017 CLINICAL DATA:  Numbness and tingling. Paresthesias. Chest pain yesterday. Numbness in the left side of the chest.  Relief with nitroglycerin. EXAM: CT HEAD WITHOUT CONTRAST TECHNIQUE: Contiguous axial images were obtained from the base of the  skull through the vertex without intravenous contrast. COMPARISON:  None. FINDINGS: Brain: Mild cerebral atrophy. No evidence of acute infarction, hemorrhage, hydrocephalus, extra-axial collection or mass lesion/mass effect. Vascular: No hyperdense vessel or unexpected calcification. Skull: Normal. Negative for fracture or focal lesion. Sinuses/Orbits: No acute finding. Other: None. IMPRESSION: No acute intracranial abnormalities. Electronically Signed   By: Burman NievesWilliam  Stevens M.D.   On: 05/07/2017 02:21    EKG:   Orders placed or performed during the hospital encounter of 05/07/17  . ED EKG within 10 minutes  . ED EKG within 10 minutes    ASSESSMENT AND PLAN:   66 y/o M with PMH of HTN, CAD s/p CABG and stents, bio prosthetic aortic valve replacement, chronic systolic CHF, persistent afib not on anticoagulation due to h/o GI bleed presents secondary to chest pain and elevated troponin.  #1  NSTEMI vs unstable angina- troponin is elevated but plateaued - - has known CAD and recent cath with PCI in oct 2018 -Appreciate cardiology consult. We'll continue heparin drip for now -Already dual antiplatelet treatment with aspirin and Plavix. On Imdur and , Toprol and Lipitor -Possible cardiac catheterization tomorrow.   #2 chronic systolic CHF-well compensated at this time. -Lasix as needed  #3 chronic A. fib-on digoxin, Toprol. -On heparin drip currently. Not on chronic anticoagulation due to history of GI bleed. Continue aspirin  #4 diabetes mellitus-at sliding scale insulin. Sugars are elevated. Hold metformin for possible cardiac catheter. Add low-dose Lantus.  #5 DVT prophylaxis-heparin drip    All the records are reviewed and case discussed with Care Management/Social Workerr. Management plans discussed with the patient, family and they are in  agreement.  CODE STATUS: Full Code  TOTAL TIME TAKING CARE OF THIS PATIENT: 37 minutes.   POSSIBLE D/C IN 1-2 DAYS, DEPENDING ON CLINICAL CONDITION.   Enid BaasKALISETTI,Lakethia Coppess M.D on 05/07/2017 at 1:23 PM  Between 7am to 6pm - Pager - [phone]  After 6pm go to [email or web address] - Social research officer, governmentpassword EPAS [facility]  [facility]  Office  [phone]  CC: Primary care physician; Galen ManilaKennedy, Lauren Renee, NP

## 2017-05-07 NOTE — Care Management (Signed)
Admitted from home with nstemi.  Cardiology is consulting.  Strong cardiac history with stents. has [facility] sx.  Current with PCP.

## 2017-05-07 NOTE — Plan of Care (Signed)
Remains on heparin gtt, troponins trending down this am.  Not able to ambulate due to heparin gtt right now

## 2017-05-08 ENCOUNTER — Encounter
Admission: EM | Disposition: A | Discharge status: Home / Self Care | Payer: Self-pay | Source: Home / Self Care | Attending: Internal Medicine

## 2017-05-08 ENCOUNTER — Ambulatory Visit: Payer: BLUE CROSS/BLUE SHIELD | Admitting: Nurse Practitioner

## 2017-05-08 DIAGNOSIS — I214 Non-ST elevation (NSTEMI) myocardial infarction: Principal | ICD-10-CM

## 2017-05-08 DIAGNOSIS — I481 Persistent atrial fibrillation: Secondary | ICD-10-CM

## 2017-05-08 DIAGNOSIS — Z953 Presence of xenogenic heart valve: Secondary | ICD-10-CM

## 2017-05-08 HISTORY — PX: CORONARY/GRAFT ANGIOGRAPHY: CATH118237

## 2017-05-08 HISTORY — PX: CORONARY STENT INTERVENTION: CATH118234

## 2017-05-08 LAB — CBC
HEMATOCRIT: 35.8 % — AB (ref 40.0–52.0)
Hemoglobin: 12.4 g/dL — ABNORMAL LOW (ref 13.0–18.0)
MCH: 27.9 pg (ref 26.0–34.0)
MCHC: 34.6 g/dL (ref 32.0–36.0)
MCV: 80.6 fL (ref 80.0–100.0)
PLATELETS: 203 10*3/uL (ref 150–440)
RBC: 4.44 MIL/uL (ref 4.40–5.90)
RDW: 14.4 % (ref 11.5–14.5)
WBC: 9.2 10*3/uL (ref 3.8–10.6)

## 2017-05-08 LAB — DIGOXIN LEVEL: DIGOXIN LVL: 0.3 ng/mL — AB (ref 0.8–2.0)

## 2017-05-08 LAB — GLUCOSE, CAPILLARY
GLUCOSE-CAPILLARY: 147 mg/dL — AB (ref 65–99)
GLUCOSE-CAPILLARY: 247 mg/dL — AB (ref 65–99)
GLUCOSE-CAPILLARY: 256 mg/dL — AB (ref 65–99)

## 2017-05-08 LAB — POCT ACTIVATED CLOTTING TIME: ACTIVATED CLOTTING TIME: 252 s

## 2017-05-08 LAB — HEPARIN LEVEL (UNFRACTIONATED): Heparin Unfractionated: 0.63 IU/mL (ref 0.30–0.70)

## 2017-05-08 SURGERY — CORONARY STENT INTERVENTION

## 2017-05-08 MED ORDER — ASPIRIN EC 81 MG PO TBEC
81.0000 mg | DELAYED_RELEASE_TABLET | Freq: Every day | ORAL | Status: DC
  Administered 2017-05-09: 81 mg via ORAL
  Filled 2017-05-08: qty 1

## 2017-05-08 MED ORDER — HEPARIN (PORCINE) IN NACL 2-0.9 UNIT/ML-% IJ SOLN
INTRAMUSCULAR | Status: AC
  Filled 2017-05-08: qty 500

## 2017-05-08 MED ORDER — LIDOCAINE HCL (CARDIAC) 20 MG/ML IV SOLN
INTRAVENOUS | Status: AC
  Filled 2017-05-08: qty 5

## 2017-05-08 MED ORDER — SODIUM CHLORIDE 0.9 % IV SOLN
INTRAVENOUS | Status: AC
  Administered 2017-05-08: 17:00:00 via INTRAVENOUS

## 2017-05-08 MED ORDER — HEPARIN SODIUM (PORCINE) 1000 UNIT/ML IJ SOLN
INTRAMUSCULAR | Status: AC
  Filled 2017-05-08: qty 1

## 2017-05-08 MED ORDER — SODIUM CHLORIDE 0.9% FLUSH
3.0000 mL | Freq: Two times a day (BID) | INTRAVENOUS | Status: DC
  Administered 2017-05-08 – 2017-05-09 (×2): 3 mL via INTRAVENOUS

## 2017-05-08 MED ORDER — CLOPIDOGREL BISULFATE 75 MG PO TABS
ORAL_TABLET | ORAL | Status: AC
  Filled 2017-05-08: qty 4

## 2017-05-08 MED ORDER — NITROGLYCERIN 5 MG/ML IV SOLN
INTRAVENOUS | Status: AC
  Filled 2017-05-08: qty 10

## 2017-05-08 MED ORDER — NITROGLYCERIN 1 MG/10 ML FOR IR/CATH LAB
INTRA_ARTERIAL | Status: DC | PRN
  Administered 2017-05-08: 100 ug via INTRACORONARY

## 2017-05-08 MED ORDER — ENOXAPARIN SODIUM 40 MG/0.4ML ~~LOC~~ SOLN
40.0000 mg | SUBCUTANEOUS | Status: DC
  Administered 2017-05-09: 40 mg via SUBCUTANEOUS
  Filled 2017-05-08: qty 0.4

## 2017-05-08 MED ORDER — SODIUM CHLORIDE 0.9 % IV SOLN
250.0000 mL | INTRAVENOUS | Status: DC | PRN
Start: 2017-05-08 — End: 2017-05-09

## 2017-05-08 MED ORDER — SODIUM CHLORIDE 0.9% FLUSH: 3.0000 mL | INTRAVENOUS | Status: DC | PRN

## 2017-05-08 MED ORDER — FENTANYL CITRATE (PF) 100 MCG/2ML IJ SOLN
INTRAMUSCULAR | Status: DC | PRN
  Administered 2017-05-08: 50 ug

## 2017-05-08 MED ORDER — CLOPIDOGREL BISULFATE 75 MG PO TABS
ORAL_TABLET | ORAL | Status: DC | PRN
  Administered 2017-05-08: 300 mg via ORAL

## 2017-05-08 MED ORDER — INSULIN GLARGINE 100 UNIT/ML ~~LOC~~ SOLN
12.0000 [IU] | Freq: Every day | SUBCUTANEOUS | Status: DC
  Administered 2017-05-08: 12 [IU] via SUBCUTANEOUS
  Filled 2017-05-08 (×2): qty 0.12

## 2017-05-08 MED ORDER — VERAPAMIL HCL 2.5 MG/ML IV SOLN
INTRAVENOUS | Status: DC | PRN
  Administered 2017-05-08: 2.5 mg via INTRAVENOUS

## 2017-05-08 MED ORDER — ASPIRIN 81 MG PO CHEW
CHEWABLE_TABLET | ORAL | Status: AC
  Filled 2017-05-08: qty 1

## 2017-05-08 MED ORDER — HYDRALAZINE HCL 20 MG/ML IJ SOLN: 5.0000 mg | INTRAMUSCULAR | Status: AC | PRN

## 2017-05-08 MED ORDER — VERAPAMIL HCL 2.5 MG/ML IV SOLN
INTRAVENOUS | Status: AC
  Filled 2017-05-08: qty 2

## 2017-05-08 MED ORDER — LIDOCAINE HCL (PF) 1 % IJ SOLN
INTRAMUSCULAR | Status: DC | PRN
  Administered 2017-05-08: 2 mL via SUBCUTANEOUS

## 2017-05-08 MED ORDER — ASPIRIN 81 MG PO CHEW
CHEWABLE_TABLET | ORAL | Status: DC | PRN
  Administered 2017-05-08: 81 mg via ORAL

## 2017-05-08 MED ORDER — MIDAZOLAM HCL 2 MG/2ML IJ SOLN
INTRAMUSCULAR | Status: DC | PRN
  Administered 2017-05-08: 1 mg

## 2017-05-08 MED ORDER — FENTANYL CITRATE (PF) 100 MCG/2ML IJ SOLN
INTRAMUSCULAR | Status: AC
  Filled 2017-05-08: qty 2

## 2017-05-08 MED ORDER — HEPARIN SODIUM (PORCINE) 1000 UNIT/ML IJ SOLN
INTRAMUSCULAR | Status: DC | PRN
  Administered 2017-05-08 (×2): 5000 [IU] via INTRAVENOUS
  Administered 2017-05-08: 2000 [IU] via INTRAVENOUS

## 2017-05-08 MED ORDER — MIDAZOLAM HCL 2 MG/2ML IJ SOLN
INTRAMUSCULAR | Status: AC
  Filled 2017-05-08: qty 2

## 2017-05-08 MED ORDER — IOPAMIDOL (ISOVUE-300) INJECTION 61%
INTRAVENOUS | Status: DC | PRN
  Administered 2017-05-08: 140 mL via INTRA_ARTERIAL

## 2017-05-08 SURGICAL SUPPLY — 17 items
BALLN ANGIOSCULPT RX 2.5X10 (BALLOONS) ×2
BALLN ~~LOC~~ TREK RX 2.5X15 (BALLOONS) ×2
BALLOON ANGIOSCULPT RX 2.5X10 (BALLOONS) ×1 IMPLANT
BALLOON ~~LOC~~ TREK RX 2.5X15 (BALLOONS) ×1 IMPLANT
CATH INFINITI 5 FR IM (CATHETERS) ×2 IMPLANT
CATH INFINITI 5FR JL4 (CATHETERS) ×2 IMPLANT
CATH INFINITI JR4 5F (CATHETERS) ×2 IMPLANT
CATH LAUNCHER 6FR EBU 3 (CATHETERS) ×2 IMPLANT
DEVICE INFLAT 30 PLUS (MISCELLANEOUS) ×2 IMPLANT
DEVICE RAD TR BAND REGULAR (VASCULAR PRODUCTS) ×2 IMPLANT
GLIDESHEATH SLEND SS 6F .021 (SHEATH) ×2 IMPLANT
KIT MANI 3VAL PERCEP (MISCELLANEOUS) ×2 IMPLANT
PACK CARDIAC CATH (CUSTOM PROCEDURE TRAY) ×2 IMPLANT
STENT RESOLUTE ONYX 2.25X22 (Permanent Stent) ×2 IMPLANT
WIRE HITORQ VERSACORE ST 145CM (WIRE) ×2 IMPLANT
WIRE ROSEN-J .035X260CM (WIRE) ×4 IMPLANT
WIRE RUNTHROUGH .014X180CM (WIRE) ×2 IMPLANT

## 2017-05-08 NOTE — Interval H&P Note (Signed)
History and Physical Interval Note:  05/08/2017 10:04 AM  Christopher Elseharles Artola Sr.  has presented today for cardiac catheterization, with the diagnosis of NSTEMI  The various methods of treatment have been discussed with the patient and family. After consideration of risks, benefits and other options for treatment, the patient has consented to  Procedure(s): LEFT HEART CATH AND CORONARY ANGIOGRAPHY (N/A) as a surgical intervention .  The patient's history has been reviewed, patient examined, no change in status, stable for surgery.  I have reviewed the patient's chart and labs.  Questions were answered to the patient's satisfaction.    Cath Lab Visit (complete for each Cath Lab visit)  Clinical Evaluation Leading to the Procedure:   ACS: Yes.    Non-ACS: N/A  Kemp Gomes

## 2017-05-08 NOTE — Progress Notes (Signed)
ANTICOAGULATION CONSULT NOTE - Follow Up Consult  Pharmacy Consult for Heparin drip Indication: chest pain/ACS  Allergies  Allergen Reactions  . Metformin And Related Other (See Comments)    Only the regular Metformin (diarrhea)    Patient Measurements: Height: 5' 10.5" (179.1 cm) Weight: 226 lb 3.2 oz (102.6 kg) IBW/kg (Calculated) : 74.15 Heparin Dosing Weight: 95.8 kg  Vital Signs: Temp: 98.5 F (36.9 C) (11/06 0338) Temp Source: Oral (11/06 0338) BP: 114/57 (11/06 0338) Pulse Rate: 65 (11/06 0338)  Labs: Recent Labs    05/07/17 0015 05/07/17 0302 05/07/17 0858 05/07/17 1507 05/08/17 0305  HGB 12.8*  --  13.0  --  12.4*  HCT 37.9*  --  38.5*  --  35.8*  PLT 228  --  211  --  203  APTT 30  --   --   --   --   LABPROT 15.2  --   --   --   --   INR 1.21  --   --   --   --   HEPARINUNFRC  --   --  0.67 0.60 0.63  CREATININE 0.84  --  0.72  --   --   TROPONINI 0.26* 0.26* 0.24* 0.21*  --     Estimated Creatinine Clearance: 112.9 mL/min (by C-G formula based on SCr of 0.72 mg/dL).   Medications:  Scheduled:  . aspirin EC  162 mg Oral Daily  . atorvastatin  80 mg Oral QHS  . clopidogrel  75 mg Oral Daily  . digoxin  0.0625 mg Oral Daily  . insulin aspart  0-5 Units Subcutaneous QHS  . insulin aspart  0-9 Units Subcutaneous TID WC  . insulin glargine  10 Units Subcutaneous Daily  . isosorbide mononitrate  30 mg Oral Daily  . lisinopril  10 mg Oral QHS  . metoprolol succinate  150 mg Oral Daily  . pantoprazole  40 mg Oral Daily  . ranolazine  1,000 mg Oral BID  . sodium chloride flush  3 mL Intravenous Q12H   Infusions:  . sodium chloride    . heparin 1,300 Units/hr (05/07/17 1621)    Assessment: 66 yo M on Heparin drip for ACS/NSTEMI. Hx CABG, multiple catheterizations. Patient on ASA, plavix PTA per Med Rec.  Goal of Therapy:  Heparin level 0.3-0.7 units/ml Monitor platelets by anticoagulation protocol: Yes   Plan:  Currently on Heparin drip at  1300 units/hr. 11/5 HL@ 0858= 0.67.  Will continue current rate and f/u with confirmatory heparin level in 6 hours.   11/5:  HL @ 15:07 = 0.6.  Will continue this pt at current rate and recheck HL on 11/6 with AM labs.   11/6 @ 0300 HL 0.63 therapeutic. Will continue current rate and will recheck next HL/CBC w/ am labs.  Thomasene Rippleavid Bren Borys, PharmD, BCPS Clinical Pharmacist 05/08/2017

## 2017-05-08 NOTE — H&P (View-Only) (Signed)
Progress Note  Patient Name: Christopher ElseCharles Reeser Sr. Date of Encounter: 05/08/2017  Primary Cardiologist: Kirke CorinArida  Subjective   Feels well this morning. Had significant left arm pain overnight, which he describes as an anginal equivalent. No shortness of breath. He denies any GI bleeding since August.  Inpatient Medications    Scheduled Meds: . aspirin EC  162 mg Oral Daily  . atorvastatin  80 mg Oral QHS  . clopidogrel  75 mg Oral Daily  . digoxin  0.0625 mg Oral Daily  . insulin aspart  0-5 Units Subcutaneous QHS  . insulin aspart  0-9 Units Subcutaneous TID WC  . insulin glargine  10 Units Subcutaneous Daily  . isosorbide mononitrate  30 mg Oral Daily  . lisinopril  10 mg Oral QHS  . metoprolol succinate  150 mg Oral Daily  . pantoprazole  40 mg Oral Daily  . ranolazine  1,000 mg Oral BID  . sodium chloride flush  3 mL Intravenous Q12H   Continuous Infusions: . sodium chloride 10 mL/hr at 05/08/17 0600  . heparin 1,300 Units/hr (05/07/17 1621)   PRN Meds: acetaminophen **OR** acetaminophen, morphine injection, ondansetron **OR** ondansetron (ZOFRAN) IV, oxyCODONE, sodium chloride flush   Vital Signs    Vitals:   05/07/17 0752 05/07/17 1525 05/07/17 1915 05/08/17 0338  BP: 113/60 (!) 126/59 126/68 (!) 114/57  Pulse: 68 62 67 65  Resp: 16 17 17 17   Temp: 98 F (36.7 C) 98.2 F (36.8 C) 97.7 F (36.5 C) 98.5 F (36.9 C)  TempSrc: Oral Oral Oral Oral  SpO2: 95% 96% 95% 97%  Weight:    102.6 kg (226 lb 3.2 oz)  Height:        Intake/Output Summary (Last 24 hours) at 05/08/2017 0812 Last data filed at 05/08/2017 0603 Gross per 24 hour  Intake 1018.82 ml  Output 1200 ml  Net -181.18 ml   Filed Weights   05/07/17 0015 05/07/17 0246 05/08/17 0338  Weight: 104.3 kg (230 lb) 103.2 kg (227 lb 8 oz) 102.6 kg (226 lb 3.2 oz)    Telemetry    Atrial fibrillation with adequate rate control (heart rate 60s to 80s - Personally Reviewed  ECG    No new  tracing  Physical Exam   GEN: No acute distress.   Neck: No JVD Cardiac:  irregularly irregular, no murmurs, rubs, or gallops.  Respiratory: Clear to auscultation bilaterally. GI: Soft, nontender, non-distended  MS: No edema; No deformity. Neuro:  Nonfocal  Psych: Normal affect   Labs    Chemistry Recent Labs  Lab 05/07/17 0015 05/07/17 0858  NA 134* 134*  K 3.9 3.8  CL 98* 99*  CO2 25 26  GLUCOSE 319* 258*  BUN 18 17  CREATININE 0.84 0.72  CALCIUM 9.2 9.0  GFRNONAA >60 >60  GFRAA >60 >60  ANIONGAP 11 9     Hematology Recent Labs  Lab 05/07/17 0015 05/07/17 0858 05/08/17 0305  WBC 9.3 8.8 9.2  RBC 4.65 4.73 4.44  HGB 12.8* 13.0 12.4*  HCT 37.9* 38.5* 35.8*  MCV 81.5 81.5 80.6  MCH 27.5 27.5 27.9  MCHC 33.7 33.7 34.6  RDW 14.7* 14.9* 14.4  PLT 228 211 203    Cardiac Enzymes Recent Labs  Lab 05/07/17 0015 05/07/17 0302 05/07/17 0858 05/07/17 1507  TROPONINI 0.26* 0.26* 0.24* 0.21*   No results for input(s): TROPIPOC in the last 168 hours.   BNPNo results for input(s): BNP, PROBNP in the last 168 hours.   DDimer  No results for input(s): DDIMER in the last 168 hours.   Radiology    Dg Chest 2 View  Result Date: 05/07/2017 CLINICAL DATA:  Chest pain starting yesterday. Numbness to the left side of the chest. EXAM: CHEST  2 VIEW COMPARISON:  04/23/2017 FINDINGS: Postoperative changes in the mediastinum. Cardiac enlargement. Mild central vascular congestion. No edema or consolidation. Similar appearance to previous study. No pleural effusions. No pneumothorax. Mediastinal contours appear intact. Degenerative changes in the spine. IMPRESSION: Cardiac enlargement with mild vascular congestion. No edema or consolidation. Electronically Signed   By: Burman NievesWilliam  Stevens M.D.   On: 05/07/2017 01:07   Ct Head Wo Contrast  Result Date: 05/07/2017 CLINICAL DATA:  Numbness and tingling. Paresthesias. Chest pain yesterday. Numbness in the left side of the chest.  Relief with nitroglycerin. EXAM: CT HEAD WITHOUT CONTRAST TECHNIQUE: Contiguous axial images were obtained from the base of the skull through the vertex without intravenous contrast. COMPARISON:  None. FINDINGS: Brain: Mild cerebral atrophy. No evidence of acute infarction, hemorrhage, hydrocephalus, extra-axial collection or mass lesion/mass effect. Vascular: No hyperdense vessel or unexpected calcification. Skull: Normal. Negative for fracture or focal lesion. Sinuses/Orbits: No acute finding. Other: None. IMPRESSION: No acute intracranial abnormalities. Electronically Signed   By: Burman NievesWilliam  Stevens M.D.   On: 05/07/2017 02:21    Cardiac Studies   No new studies  Patient Profile     66 y.o. male with history of coronary artery disease status post CABG and bioprosthetic aortic valve (TAVR), and persistent atrial fibrillation not on anticoagulation due to GI bleed in 01/2017, admitted with recurrent chest pain. He was hospitalized in August and October with NSTEMI's and required balloon angioplasty to the LCx and diagonal branches.  Assessment & Plan    NSTEMI Symptoms typical with patient's prior presentations. He has had aggressive in-stent restenosis, predominantly involving the diagonal and LCx. He is currently chest pain-free but had recurrent discomfort overnight.  Continue heparin infusion as well as aspirin and clopidogrel.  Isosorbide mononitrate and ranolazine increased yesterday.  Plan for left heart catheterization and possible PCI today. I have reviewed the risks, indications, and alternatives to cardiac catheterization, possible angioplasty, and stenting with the patient. Risks include but are not limited to bleeding, infection, vascular injury, stroke, myocardial infection, arrhythmia, kidney injury, radiation-related injury in the case of prolonged fluoroscopy use, emergency cardiac surgery, and death. The patient understands the risks of serious complication is 1-2 in 1000 with  diagnostic cardiac cath and 1-2% or less with angioplasty/stenting.  Continue metoprolol and atorvastatin.  Persistent atrial fibrillation Heart rate is adequately controlled today.  Continue metoprolol and digitoxin.  Defer restarting anticoagulation at this time in the setting of dual antiplatelet therapy and history of GI bleed earlier this year.  Ischemic cardiomyopathy with chronic systolic heart failure Christopher Cummings appears euvolemic and well compensated today. Most recent LVEF by echo was 35-40% in early October.  Continue metoprolol and lisinopril.  Consider adding spironolactone at the time of discharge based on renal function and potassium.  History of bioprosthetic aortic valve No clinical evidence of valve dysfunction.  Hypertension Blood pressure adequately controlled.  No medication changes at this time.  Signed, Yvonne Kendallhristopher Sarya Linenberger, MD  05/08/2017, 8:12 AM

## 2017-05-08 NOTE — Progress Notes (Signed)
Progress Note  Patient Name: Christopher Hing Sr. Date of Encounter: 05/08/2017  Primary Cardiologist: Kirke Corin  Subjective   Had severe, 7/10 left arm pain overnight. Now improved. No chest pain or SOB. For LHC this morning with Dr. Okey Dupre. Pre-cath labs stable. Remains on DAPT and heparin gtt. Remains in Afib/flutter with well controlled heart rates in the 60s to 70s bpm.    Inpatient Medications    Scheduled Meds: . aspirin EC  162 mg Oral Daily  . atorvastatin  80 mg Oral QHS  . clopidogrel  75 mg Oral Daily  . digoxin  0.0625 mg Oral Daily  . insulin aspart  0-5 Units Subcutaneous QHS  . insulin aspart  0-9 Units Subcutaneous TID WC  . insulin glargine  10 Units Subcutaneous Daily  . isosorbide mononitrate  30 mg Oral Daily  . lisinopril  10 mg Oral QHS  . metoprolol succinate  150 mg Oral Daily  . pantoprazole  40 mg Oral Daily  . ranolazine  1,000 mg Oral BID  . sodium chloride flush  3 mL Intravenous Q12H   Continuous Infusions: . sodium chloride 10 mL/hr at 05/08/17 0600  . heparin Stopped (05/08/17 0818)   PRN Meds: acetaminophen **OR** acetaminophen, morphine injection, ondansetron **OR** ondansetron (ZOFRAN) IV, oxyCODONE, sodium chloride flush   Vital Signs    Vitals:   05/07/17 1525 05/07/17 1915 05/08/17 0338 05/08/17 0824  BP: (!) 126/59 126/68 (!) 114/57 122/71  Pulse: 62 67 65 64  Resp: 17 17 17    Temp: 98.2 F (36.8 C) 97.7 F (36.5 C) 98.5 F (36.9 C) 98.2 F (36.8 C)  TempSrc: Oral Oral Oral Oral  SpO2: 96% 95% 97% 96%  Weight:   226 lb 3.2 oz (102.6 kg)   Height:        Intake/Output Summary (Last 24 hours) at 05/08/2017 0832 Last data filed at 05/08/2017 0603 Gross per 24 hour  Intake 1018.82 ml  Output 1200 ml  Net -181.18 ml   Filed Weights   05/07/17 0015 05/07/17 0246 05/08/17 0338  Weight: 230 lb (104.3 kg) 227 lb 8 oz (103.2 kg) 226 lb 3.2 oz (102.6 kg)    Telemetry    Afib/flutter, 60s to 70s bpm - Personally Reviewed  ECG     n/a - Personally Reviewed  Physical Exam   GEN: No acute distress.   Neck: No JVD. Cardiac: RRR, no murmurs, rubs, or gallops.  Respiratory: Clear to auscultation bilaterally.  GI: Soft, nontender, non-distended.   MS: No edema; No deformity. Neuro:  Alert and oriented x 3; Nonfocal.  Psych: Normal affect.  Labs    Chemistry Recent Labs  Lab 05/07/17 0015 05/07/17 0858  NA 134* 134*  K 3.9 3.8  CL 98* 99*  CO2 25 26  GLUCOSE 319* 258*  BUN 18 17  CREATININE 0.84 0.72  CALCIUM 9.2 9.0  GFRNONAA >60 >60  GFRAA >60 >60  ANIONGAP 11 9     Hematology Recent Labs  Lab 05/07/17 0015 05/07/17 0858 05/08/17 0305  WBC 9.3 8.8 9.2  RBC 4.65 4.73 4.44  HGB 12.8* 13.0 12.4*  HCT 37.9* 38.5* 35.8*  MCV 81.5 81.5 80.6  MCH 27.5 27.5 27.9  MCHC 33.7 33.7 34.6  RDW 14.7* 14.9* 14.4  PLT 228 211 203    Cardiac Enzymes Recent Labs  Lab 05/07/17 0015 05/07/17 0302 05/07/17 0858 05/07/17 1507  TROPONINI 0.26* 0.26* 0.24* 0.21*   No results for input(s): TROPIPOC in the last 168  hours.   BNPNo results for input(s): BNP, PROBNP in the last 168 hours.   DDimer No results for input(s): DDIMER in the last 168 hours.   Radiology    Dg Chest 2 View  Result Date: 05/07/2017 IMPRESSION: Cardiac enlargement with mild vascular congestion. No edema or consolidation. Electronically Signed   By: Burman Nieves M.D.   On: 05/07/2017 01:07   Ct Head Wo Contrast  Result Date: 05/07/2017 IMPRESSION: No acute intracranial abnormalities. Electronically Signed   By: Burman Nieves M.D.   On: 05/07/2017 02:21    Cardiac Studies   LHC 04/02/2017: Coronary Findings   Dominance: Right  Left Main  Vessel is angiographically normal.  Left Anterior Descending  Mid LAD lesion, 70% stenosed.  Dist LAD lesion, 80% stenosed.  First Diagonal Branch  Ost 1st Diag to 1st Diag lesion, 95% stenosed. The lesion was previously treated.  Angioplasty: Angioplasty alone was  performed using a BALLOON EUPHORA RX 2.0X12.  Supplies used: BALLOON WOLVERINE 2.00X10; BALLOON WOLVERINE 2.50X10; [employer] [place] ZO1.0R60  There is a 50% residual stenosis post intervention.  1st Diag lesion, 70% stenosed.  Left Circumflex  Ost Cx to Mid Cx lesion, 90% stenosed. The lesion was previously treated.  Angioplasty: Angioplasty alone was performed using a BALLOON WOLVERINE 2.50X10.  Supplies used: BALLOON WOLVERINE 3.00X10  There is a 10% residual stenosis post intervention.  Dist Cx lesion, 70% stenosed. The lesion was previously treated.  Angioplasty: A stent was successfully placed.  There is a 10% residual stenosis post intervention.  First Obtuse Marginal Branch  Vessel is small in size. There is mild disease in the vessel.  Third Obtuse Marginal Branch  Ost 3rd Mrg lesion, 90% stenosed.  Right Coronary Artery  Prox RCA lesion, 10% stenosed. The lesion was previously treated.  Mid RCA lesion, 30% stenosed.  Right Posterior Descending Artery  Vessel is angiographically normal.  Right Posterior Atrioventricular Branch  There is mild disease in the vessel.  First Right Posterolateral  There is mild disease in the vessel.  Second Right Posterolateral  There is mild disease in the vessel.  Third Right Posterolateral  There is mild disease in the vessel.  Graft Angiography  Free LIMA Graft to Dist LAD  LIMA and is anatomically normal.  Coronary Diagrams   Diagnostic Diagram       Post-Intervention Diagram          TTE 04/02/2017: Study Conclusions  - Left ventricle: The cavity size was normal. Systolic function was moderately reduced. The estimated ejection fraction was in the range of 35% to 40%. Diffuse hypokinesis. The study was not technically sufficient to allow evaluation of LV diastolic dysfunction due to atrial fibrillation. - Aortic valve: A bioprosthesis sits well in the aortic position. Mobility was not restricted. There  was no regurgitation. Mean gradient (S): 14 mm Hg. Peak gradient (S): 23 mm Hg. Valve area (VTI): 1.17 cm^2. Valve area (Vmax): 1.27 cm^2. Valve area (Vmean): 1.17 cm^2. - Mitral valve: There was mild regurgitation. - Right ventricle: The cavity size was mildly dilated. Wall thickness was normal. Systolic function was mildly reduced. - Tricuspid valve: There was trivial regurgitation. - Pulmonary arteries: Systolic pressure was within the normal range.  Impressions:  - When compared to the prior study from 02/25/2017 LVEF has decreased from 50-55% to 35-40%. Gradients across the bioprosthetic valve are normal. No aortic regurgitation or paravalvular leak. RV is mildly dilated with mildly decreased systolic function.   LHC 02/26/2017: Coronary Findings  Dominance: Right  Left Main  Vessel is angiographically normal.  Left Anterior Descending  Mid LAD lesion, 70% stenosed.  Dist LAD lesion, 100% stenosed.  First Diagonal Branch  Ost 1st Diag to 1st Diag lesion, 95% stenosed. The lesion was previously treated.  1st Diag lesion, 70% stenosed.  Left Circumflex  Prox Cx lesion, 70% stenosed. The lesion was previously treated.  Angioplasty: Lesion crossed with guidewire. Angioplasty alone was performed. The pre-interventional distal flow is normal (TIMI 3). The post-interventional distal flow is normal (TIMI 3). The intervention was successful . No complications occurred at this lesion.  There is a 15% residual stenosis post intervention.  Dist Cx lesion, 95% stenosed. The lesion was previously treated.  Angioplasty: Angioplasty alone was performed. The pre-interventional distal flow is normal (TIMI 3). The post-interventional distal flow is normal (TIMI 3). The intervention was successful . No complications occurred at this lesion.  There is a 30% residual stenosis post intervention.  First Obtuse Marginal Branch  Vessel is small in size. There is mild disease  in the vessel.  Third Obtuse Marginal Branch  Ost 3rd Mrg lesion, 90% stenosed.  Right Coronary Artery  Prox RCA lesion, 10% stenosed. The lesion was previously treated.  Mid RCA lesion, 30% stenosed.  Right Posterior Descending Artery  Vessel is angiographically normal.  Right Posterior Atrioventricular Branch  There is mild disease in the vessel.  First Right Posterolateral  There is mild disease in the vessel.  Second Right Posterolateral  There is mild disease in the vessel.  Third Right Posterolateral  There is mild disease in the vessel.  Graft Angiography  Free LIMA Graft to Dist LAD  LIMA and is anatomically normal.  Coronary Diagrams   Diagnostic Diagram       Post-Intervention Diagram         Lexiscan Myoview 04/24/2017:  Abnormal, potentially high risk pharmacologic myocardial perfusion stress test.  There is a small in size, moderate in severity, reversible defect involving the apical anterior and apical lateral segments consistent with ischemia.  There is a small in size, mild in severity, reversible defect involving the mid anterolateral segment consistent with ischemia.  There is a small in size, severe, fixed defect involving the apical inferior and apical segments consistent with scar.  The left ventricular ejection fraction is mildly decreased (46%).  EKG demonstrates subtle inferior ST-segment elevation and atrial fibrillation with rapid ventricular response immediately following administration of regadenoson.     Patient Profile     66 y.o. male with history of  CAD s/p CABG with LIMA-LAD, LCx stenting, [place], also with history of ICM/chronic systolic CHF, persistent Afib not onfull dose anticoagulation due to recurrent and recent GI bleed, hypertension, hyperlipidemia, and diabeteswho is being seen today for the evaluation of chest pain/elevated troponin at the request of Dr. Anne HahnWillis,  MD.   Assessment & Plan    1. Unstable angina/CAD: -Troponin is mildly elevated with a peak of 0.26, more than prior, though relatively flat trending -He does have known CAD as detailed above -Prior episode of unstable angina was felt to be in the setting of tachycardic heart rates -This current episode has been in the setting of controlled heart rate -[facility]key DupreEnd, MD -Continue heparin gtt  -DAPT with ASA and Plavix -Continue Imdur to 30 mg daily and Ranexa to 1000 mg bid -Continue Toprol and Lipitor -Risks and benefits of cardiac catheterization have been discussed with the patient  including risks of bleeding, bruising, infection, kidney damage, stroke, heart attack, and death. The patient understands these risks and is willing to proceed with the procedure. All questions have been answered and concerns listened to  2. Persistent Afib/flutter: -Rate well controlled -Continue rate control with Toprol and digoxin -Check digoxin level  -Not on full dose anticoagulation given recurrent and recent GI bleed as well as in the setting of dual antiplatelet therapy as above  3. ICM/chronic systolic CHF: -He does not appear to be volume up at this time -Continue Toprol and lisinopril  -Consider adding spironolactone prior to discharge  -Recent echo as above, no need to repeat at this time -CHF education   4. History of bioprosthetic aortic valve: -Stable by recent echo in 04/2017 -Outpatient follow up  5. HTN: -Stable -Continue current medications    For questions or updates, please contact [facility] Please consult [email or web address] for contact info under Cardiology/STEMI.    Signed, Eula Listenyan Claretha Townshend, PA-C [facility] Pager: [phone] 05/08/2017, 8:32 AM

## 2017-05-08 NOTE — Progress Notes (Signed)
 Progress Note  Patient Name: Christopher Lawley Sr. Date of Encounter: 05/08/2017  Primary Cardiologist: Arida  Subjective   Feels well this morning. Had significant left arm pain overnight, which he describes as an anginal equivalent. No shortness of breath. He denies any GI bleeding since August.  Inpatient Medications    Scheduled Meds: . aspirin EC  162 mg Oral Daily  . atorvastatin  80 mg Oral QHS  . clopidogrel  75 mg Oral Daily  . digoxin  0.0625 mg Oral Daily  . insulin aspart  0-5 Units Subcutaneous QHS  . insulin aspart  0-9 Units Subcutaneous TID WC  . insulin glargine  10 Units Subcutaneous Daily  . isosorbide mononitrate  30 mg Oral Daily  . lisinopril  10 mg Oral QHS  . metoprolol succinate  150 mg Oral Daily  . pantoprazole  40 mg Oral Daily  . ranolazine  1,000 mg Oral BID  . sodium chloride flush  3 mL Intravenous Q12H   Continuous Infusions: . sodium chloride 10 mL/hr at 05/08/17 0600  . heparin 1,300 Units/hr (05/07/17 1621)   PRN Meds: acetaminophen **OR** acetaminophen, morphine injection, ondansetron **OR** ondansetron (ZOFRAN) IV, oxyCODONE, sodium chloride flush   Vital Signs    Vitals:   05/07/17 0752 05/07/17 1525 05/07/17 1915 05/08/17 0338  BP: 113/60 (!) 126/59 126/68 (!) 114/57  Pulse: 68 62 67 65  Resp: 16 17 17 17  Temp: 98 F (36.7 C) 98.2 F (36.8 C) 97.7 F (36.5 C) 98.5 F (36.9 C)  TempSrc: Oral Oral Oral Oral  SpO2: 95% 96% 95% 97%  Weight:    102.6 kg (226 lb 3.2 oz)  Height:        Intake/Output Summary (Last 24 hours) at 05/08/2017 0812 Last data filed at 05/08/2017 0603 Gross per 24 hour  Intake 1018.82 ml  Output 1200 ml  Net -181.18 ml   Filed Weights   05/07/17 0015 05/07/17 0246 05/08/17 0338  Weight: 104.3 kg (230 lb) 103.2 kg (227 lb 8 oz) 102.6 kg (226 lb 3.2 oz)    Telemetry    Atrial fibrillation with adequate rate control (heart rate 60s to 80s - Personally Reviewed  ECG    No new  tracing  Physical Exam   GEN: No acute distress.   Neck: No JVD Cardiac:  irregularly irregular, no murmurs, rubs, or gallops.  Respiratory: Clear to auscultation bilaterally. GI: Soft, nontender, non-distended  MS: No edema; No deformity. Neuro:  Nonfocal  Psych: Normal affect   Labs    Chemistry Recent Labs  Lab 05/07/17 0015 05/07/17 0858  NA 134* 134*  K 3.9 3.8  CL 98* 99*  CO2 25 26  GLUCOSE 319* 258*  BUN 18 17  CREATININE 0.84 0.72  CALCIUM 9.2 9.0  GFRNONAA >60 >60  GFRAA >60 >60  ANIONGAP 11 9     Hematology Recent Labs  Lab 05/07/17 0015 05/07/17 0858 05/08/17 0305  WBC 9.3 8.8 9.2  RBC 4.65 4.73 4.44  HGB 12.8* 13.0 12.4*  HCT 37.9* 38.5* 35.8*  MCV 81.5 81.5 80.6  MCH 27.5 27.5 27.9  MCHC 33.7 33.7 34.6  RDW 14.7* 14.9* 14.4  PLT 228 211 203    Cardiac Enzymes Recent Labs  Lab 05/07/17 0015 05/07/17 0302 05/07/17 0858 05/07/17 1507  TROPONINI 0.26* 0.26* 0.24* 0.21*   No results for input(s): TROPIPOC in the last 168 hours.   BNPNo results for input(s): BNP, PROBNP in the last 168 hours.   DDimer   No results for input(s): DDIMER in the last 168 hours.   Radiology    Dg Chest 2 View  Result Date: 05/07/2017 CLINICAL DATA:  Chest pain starting yesterday. Numbness to the left side of the chest. EXAM: CHEST  2 VIEW COMPARISON:  04/23/2017 FINDINGS: Postoperative changes in the mediastinum. Cardiac enlargement. Mild central vascular congestion. No edema or consolidation. Similar appearance to previous study. No pleural effusions. No pneumothorax. Mediastinal contours appear intact. Degenerative changes in the spine. IMPRESSION: Cardiac enlargement with mild vascular congestion. No edema or consolidation. Electronically Signed   By: Burman NievesWilliam  Stevens M.D.   On: 05/07/2017 01:07   Ct Head Wo Contrast  Result Date: 05/07/2017 CLINICAL DATA:  Numbness and tingling. Paresthesias. Chest pain yesterday. Numbness in the left side of the chest.  Relief with nitroglycerin. EXAM: CT HEAD WITHOUT CONTRAST TECHNIQUE: Contiguous axial images were obtained from the base of the skull through the vertex without intravenous contrast. COMPARISON:  None. FINDINGS: Brain: Mild cerebral atrophy. No evidence of acute infarction, hemorrhage, hydrocephalus, extra-axial collection or mass lesion/mass effect. Vascular: No hyperdense vessel or unexpected calcification. Skull: Normal. Negative for fracture or focal lesion. Sinuses/Orbits: No acute finding. Other: None. IMPRESSION: No acute intracranial abnormalities. Electronically Signed   By: Burman NievesWilliam  Stevens M.D.   On: 05/07/2017 02:21    Cardiac Studies   No new studies  Patient Profile     66 y.o. male with history of coronary artery disease status post CABG and bioprosthetic aortic valve (TAVR), and persistent atrial fibrillation not on anticoagulation due to GI bleed in 01/2017, admitted with recurrent chest pain. He was hospitalized in August and October with NSTEMI's and required balloon angioplasty to the LCx and diagonal branches.  Assessment & Plan    NSTEMI Symptoms typical with patient's prior presentations. He has had aggressive in-stent restenosis, predominantly involving the diagonal and LCx. He is currently chest pain-free but had recurrent discomfort overnight.  Continue heparin infusion as well as aspirin and clopidogrel.  Isosorbide mononitrate and ranolazine increased yesterday.  Plan for left heart catheterization and possible PCI today. I have reviewed the risks, indications, and alternatives to cardiac catheterization, possible angioplasty, and stenting with the patient. Risks include but are not limited to bleeding, infection, vascular injury, stroke, myocardial infection, arrhythmia, kidney injury, radiation-related injury in the case of prolonged fluoroscopy use, emergency cardiac surgery, and death. The patient understands the risks of serious complication is 1-2 in 1000 with  diagnostic cardiac cath and 1-2% or less with angioplasty/stenting.  Continue metoprolol and atorvastatin.  Persistent atrial fibrillation Heart rate is adequately controlled today.  Continue metoprolol and digitoxin.  Defer restarting anticoagulation at this time in the setting of dual antiplatelet therapy and history of GI bleed earlier this year.  Ischemic cardiomyopathy with chronic systolic heart failure Mr. Finelli appears euvolemic and well compensated today. Most recent LVEF by echo was 35-40% in early October.  Continue metoprolol and lisinopril.  Consider adding spironolactone at the time of discharge based on renal function and potassium.  History of bioprosthetic aortic valve No clinical evidence of valve dysfunction.  Hypertension Blood pressure adequately controlled.  No medication changes at this time.  Signed, Yvonne Kendallhristopher Ambriel Gorelick, MD  05/08/2017, 8:12 AM

## 2017-05-08 NOTE — Progress Notes (Signed)
[employer] [facility] [place] at [facility]   PATIENT NAME: Christopher Cummings    MR#:  [id number]  DATE OF BIRTH:  [birth year]  SUBJECTIVE:  CHIEF COMPLAINT:   Chief Complaint  Patient presents with  . Chest Pain   - Had one more episode of left arm pain last night that improved with oxycodone. -For cardiac catheterization this morning  REVIEW OF SYSTEMS:  Review of Systems  Constitutional: Negative for chills, fever and malaise/fatigue.  HENT: Negative for congestion, ear discharge, hearing loss and nosebleeds.   Eyes: Negative for blurred vision and double vision.  Respiratory: Negative for cough, shortness of breath and wheezing.   Cardiovascular: Negative for chest pain, palpitations and leg swelling.  Gastrointestinal: Negative for abdominal pain, constipation, diarrhea, nausea and vomiting.  Genitourinary: Negative for dysuria and urgency.  Musculoskeletal: Positive for myalgias.  Neurological: Negative for dizziness, speech change, focal weakness, seizures and headaches.  Psychiatric/Behavioral: Negative for depression.    DRUG ALLERGIES:   Allergies  Allergen Reactions  . Metformin And Related Other (See Comments)    Only the regular Metformin (diarrhea)    VITALS:  Blood pressure 131/86, pulse 65, temperature 98.3 F (36.8 C), temperature source Oral, resp. rate 17, height 5' 10.5" (1.791 m), weight 102.6 kg (226 lb 3.2 oz), SpO2 96 %.  PHYSICAL EXAMINATION:  Physical Exam  GENERAL:  [age] y.o.-year-old patient lying in the bed with no acute distress.  EYES: Pupils equal, round, reactive to light and accommodation. No scleral icterus. Extraocular muscles intact.  HEENT: Head atraumatic, normocephalic. Oropharynx and nasopharynx clear.  NECK:  Supple, no jugular venous distention. No thyroid enlargement, no tenderness.  LUNGS: Normal breath sounds bilaterally, no wheezing, rales,rhonchi or crepitation. No use of accessory muscles of respiration.    CARDIOVASCULAR: S1, S2 normal. No   rubs, or gallops. 3/6 systolic murmur present ABDOMEN: Soft, nontender, nondistended. Bowel sounds present. No organomegaly or mass.  EXTREMITIES: No pedal edema, cyanosis, or clubbing.  NEUROLOGIC: Cranial nerves II through XII are intact. Muscle strength 5/5 in all extremities. Sensation intact. Gait not checked.  PSYCHIATRIC: The patient is alert and oriented x 3.  SKIN: No obvious rash, lesion, or ulcer.    LABORATORY PANEL:   CBC Recent Labs  Lab 05/08/17 0305  WBC 9.2  HGB 12.4*  HCT 35.8*  PLT 203   ------------------------------------------------------------------------------------------------------------------  Chemistries  Recent Labs  Lab 05/07/17 0858  NA 134*  K 3.8  CL 99*  CO2 26  GLUCOSE 258*  BUN 17  CREATININE 0.72  CALCIUM 9.0   ------------------------------------------------------------------------------------------------------------------  Cardiac Enzymes Recent Labs  Lab 05/07/17 1507  TROPONINI 0.21*   ------------------------------------------------------------------------------------------------------------------  RADIOLOGY:  Dg Chest 2 View  Result Date: 05/07/2017 CLINICAL DATA:  Chest pain starting yesterday. Numbness to the left side of the chest. EXAM: CHEST  2 VIEW COMPARISON:  04/23/2017 FINDINGS: Postoperative changes in the mediastinum. Cardiac enlargement. Mild central vascular congestion. No edema or consolidation. Similar appearance to previous study. No pleural effusions. No pneumothorax. Mediastinal contours appear intact. Degenerative changes in the spine. IMPRESSION: Cardiac enlargement with mild vascular congestion. No edema or consolidation. Electronically Signed   By: Burman NievesWilliam  Stevens M.D.   On: 05/07/2017 01:07   Ct Head Wo Contrast  Result Date: 05/07/2017 CLINICAL DATA:  Numbness and tingling. Paresthesias. Chest pain yesterday. Numbness in the left side of the chest. Relief with  nitroglycerin. EXAM: CT HEAD WITHOUT CONTRAST TECHNIQUE: Contiguous axial images were obtained from the base of the skull  through the vertex without intravenous contrast. COMPARISON:  None. FINDINGS: Brain: Mild cerebral atrophy. No evidence of acute infarction, hemorrhage, hydrocephalus, extra-axial collection or mass lesion/mass effect. Vascular: No hyperdense vessel or unexpected calcification. Skull: Normal. Negative for fracture or focal lesion. Sinuses/Orbits: No acute finding. Other: None. IMPRESSION: No acute intracranial abnormalities. Electronically Signed   By: Burman NievesWilliam  Stevens M.D.   On: 05/07/2017 02:21    EKG:   Orders placed or performed during the hospital encounter of 05/07/17  . ED EKG within 10 minutes  . ED EKG within 10 minutes    ASSESSMENT AND PLAN:   66 y/o M with PMH of HTN, CAD s/p CABG and stents, bio prosthetic aortic valve replacement, chronic systolic CHF, persistent afib not on anticoagulation due to h/o GI bleed presents secondary to chest pain and elevated troponin.  #1  NSTEMI vs unstable angina- troponin is elevated but plateaued - - has known CAD and recent cath with PCI in oct 2018 -Appreciate cardiology consult.on heparin drip for now -Already dual antiplatelet treatment with aspirin and Plavix. On Imdur and , Toprol and Lipitor -for cardiac catheterization today.   #2 chronic systolic CHF-well compensated at this time. -Lasix as needed  #3 chronic A. fib-on digoxin, Toprol. -On heparin drip currently. Not on chronic anticoagulation due to history of GI bleed. Continue aspirin -Management per cardiology  #4 diabetes mellitus-at sliding scale insulin. Sugars are elevated. Hold metformin for possible cardiac catheterization. Added low-dose Lantus.  #5 DVT prophylaxis-heparin drip    If cardiac cath Reveals no blockage, patient can be discharged today    All the records are reviewed and case discussed with Care Management/Social  Workerr. Management plans discussed with the patient, family and they are in agreement.  CODE STATUS: Full Code  TOTAL TIME TAKING CARE OF THIS PATIENT: 37 minutes.   POSSIBLE D/C TODAY OR TOMORROW, DEPENDING ON CLINICAL CONDITION.   Enid BaasKALISETTI,Omarrion Carmer M.D on 05/08/2017 at 10:25 AM  Between 7am to 6pm - Pager - [phone]  After 6pm go to [email or web address] - Social research officer, governmentpassword [email or web address]  [facility] [place] [facility]  Office  [phone]  CC: Primary care physician; Galen ManilaKennedy, Lauren Renee, NP

## 2017-05-08 NOTE — Progress Notes (Addendum)
Per cardiologist, will do cath procedure on opposite arm. So cath will be in left arm, heparin has been stopped and hair clipped. Report given. Pt has no complaints at this time. Will continue to monitor.

## 2017-05-09 LAB — CBC
HCT: 37.2 % — ABNORMAL LOW (ref 40.0–52.0)
HEMOGLOBIN: 12.3 g/dL — AB (ref 13.0–18.0)
MCH: 27.1 pg (ref 26.0–34.0)
MCHC: 33 g/dL (ref 32.0–36.0)
MCV: 82 fL (ref 80.0–100.0)
Platelets: 217 10*3/uL (ref 150–440)
RBC: 4.53 MIL/uL (ref 4.40–5.90)
RDW: 14.9 % — ABNORMAL HIGH (ref 11.5–14.5)
WBC: 9.3 10*3/uL (ref 3.8–10.6)

## 2017-05-09 LAB — BASIC METABOLIC PANEL
ANION GAP: 8 (ref 5–15)
BUN: 16 mg/dL (ref 6–20)
CALCIUM: 9 mg/dL (ref 8.9–10.3)
CHLORIDE: 102 mmol/L (ref 101–111)
CO2: 28 mmol/L (ref 22–32)
Creatinine, Ser: 0.75 mg/dL (ref 0.61–1.24)
GFR calc non Af Amer: >60 mL/min (ref >60)
Glucose, Bld: 138 mg/dL — ABNORMAL HIGH (ref 65–99)
Potassium: 3.7 mmol/L (ref 3.5–5.1)
Sodium: 138 mmol/L (ref 135–145)

## 2017-05-09 LAB — GLUCOSE, CAPILLARY
Glucose-Capillary: 134 mg/dL — ABNORMAL HIGH (ref 65–99)
Glucose-Capillary: 232 mg/dL — ABNORMAL HIGH (ref 65–99)

## 2017-05-09 MED ORDER — METOPROLOL SUCCINATE ER 100 MG PO TB24: 100.0000 mg | ORAL_TABLET | Freq: Every day | ORAL | 2 refills | Status: DC

## 2017-05-09 NOTE — Progress Notes (Signed)
Pt given scale to weight himself at home. Discharge instructions given to patient. IV out and tele off. Pt verbalized understanding with no questions. Pt transported via wheelchair to the car.

## 2017-05-09 NOTE — Progress Notes (Signed)
Progress Note  Patient Name: Christopher ElseCharles Sibley Sr. Date of Encounter: 05/09/2017  Primary Cardiologist: Kirke CorinArida  Subjective   Left arm pain yesterday relieved after Percocet Had cardiac catheterization late morning, was pain-free at the time Had stent placed to his left circumflex No pain overnight, ambulated without arm pain, feels ready to go home  Inpatient Medications    Scheduled Meds: . aspirin EC  81 mg Oral Daily  . atorvastatin  80 mg Oral QHS  . clopidogrel  75 mg Oral Daily  . digoxin  0.0625 mg Oral Daily  . enoxaparin (LOVENOX) injection  40 mg Subcutaneous Q24H  . insulin aspart  0-5 Units Subcutaneous QHS  . insulin aspart  0-9 Units Subcutaneous TID WC  . insulin glargine  12 Units Subcutaneous QHS  . isosorbide mononitrate  30 mg Oral Daily  . lisinopril  10 mg Oral QHS  . metoprolol succinate  150 mg Oral Daily  . pantoprazole  40 mg Oral Daily  . ranolazine  1,000 mg Oral BID  . sodium chloride flush  3 mL Intravenous Q12H  . sodium chloride flush  3 mL Intravenous Q12H   Continuous Infusions: . sodium chloride     PRN Meds: sodium chloride, acetaminophen **OR** acetaminophen, morphine injection, ondansetron **OR** ondansetron (ZOFRAN) IV, oxyCODONE, sodium chloride flush   Vital Signs    Vitals:   05/08/17 2006 05/08/17 2126 05/09/17 0317 05/09/17 0819  BP: (!) 96/52 115/65 (!) 104/59 (!) 118/57  Pulse: 64  68 65  Resp: 17  17   Temp: (!) 97.5 F (36.4 C)  (!) 97.4 F (36.3 C) 98 F (36.7 C)  TempSrc: Oral  Oral Oral  SpO2: 94%  94% 98%  Weight:   226 lb (102.5 kg)   Height:        Intake/Output Summary (Last 24 hours) at 05/09/2017 1016 Last data filed at 05/09/2017 1015 Gross per 24 hour  Intake 201.33 ml  Output 1210 ml  Net -1008.67 ml   Filed Weights   05/07/17 0246 05/08/17 0338 05/09/17 0317  Weight: 227 lb 8 oz (103.2 kg) 226 lb 3.2 oz (102.6 kg) 226 lb (102.5 kg)    Telemetry    Afib/flutter, 60s to 70s bpm - Personally  Reviewed  ECG    n/a - Personally Reviewed  Physical Exam    GEN: No acute distress.   Sitting up in bed, comfortable  neck: No JVD. Cardiac: RRR, no murmurs, rubs, or gallops.  Respiratory: Clear to auscultation bilaterally.  GI: Soft, nontender, non-distended.   MS: No edema; No deformity. Neuro:  Alert and oriented x 3; Nonfocal.  Psych: Normal affect.  Labs    Chemistry Recent Labs  Lab 05/07/17 0015 05/07/17 0858 05/09/17 0339  NA 134* 134* 138  K 3.9 3.8 3.7  CL 98* 99* 102  CO2 25 26 28   GLUCOSE 319* 258* 138*  BUN 18 17 16   CREATININE 0.84 0.72 0.75  CALCIUM 9.2 9.0 9.0  GFRNONAA >60 >60 >60  GFRAA >60 >60 >60  ANIONGAP 11 9 8      Hematology Recent Labs  Lab 05/07/17 0858 05/08/17 0305 05/09/17 0339  WBC 8.8 9.2 9.3  RBC 4.73 4.44 4.53  HGB 13.0 12.4* 12.3*  HCT 38.5* 35.8* 37.2*  MCV 81.5 80.6 82.0  MCH 27.5 27.9 27.1  MCHC 33.7 34.6 33.0  RDW 14.9* 14.4 14.9*  PLT 211 203 217    Cardiac Enzymes Recent Labs  Lab 05/07/17 0015 05/07/17 0302  05/07/17 0858 05/07/17 1507  TROPONINI 0.26* 0.26* 0.24* 0.21*   No results for input(s): TROPIPOC in the last 168 hours.   BNPNo results for input(s): BNP, PROBNP in the last 168 hours.   DDimer No results for input(s): DDIMER in the last 168 hours.   Radiology    Dg Chest 2 View  Result Date: 05/07/2017 IMPRESSION: Cardiac enlargement with mild vascular congestion. No edema or consolidation. Electronically Signed   By: Burman NievesWilliam  Stevens M.D.   On: 05/07/2017 01:07   Ct Head Wo Contrast  Result Date: 05/07/2017 IMPRESSION: No acute intracranial abnormalities. Electronically Signed   By: Burman NievesWilliam  Stevens M.D.   On: 05/07/2017 02:21    Cardiac Studies   Cardiac cath 05/08/2017 1. Significant two-vessel coronary artery disease, including diffusely diseased LAD that is similar to prior studies. Significant in-stent restenosis involving D1 (99%) and proximal LCx (70%) is also present. 2. Distal  LCx stent demonstrates mild to moderatein-stent restenosis of up to 30%. 3. RCA demonstrates mild disease, similar to prior studies. 4. Successful PCI to proximal LCx with placement of a Resolute Onyx 2.25 x 22 mm drug-eluting stent (post-dilated to 2.6 mm) with 0% residual stenosis and TIMI-3 flow.  Recommendations: 1. Continue dual antiplatelet therapy with aspirin and clopidogrel for at least 12 months. 2. Aggressive secondary prevention. 3. Medical management of subtotally occluded diagonal branch. This vessel is not amenable to further percutaneous interventions.   Patient Profile     66 y.o. male with history of  CAD s/p CABG with LIMA-LAD, LCx stenting, and [place], also with history of ICM/chronic systolic CHF, persistent Afib not onfull dose anticoagulation due to recurrent and recent GI bleed, hypertension, hyperlipidemia, and diabeteswho is being seen today for the evaluation of chest pain/elevated troponin at the request of Dr. Anne HahnWillis, MD.   Assessment & Plan    1. Unstable angina/CAD: Cardiac catheterization performed yesterday with stent placed to proximal left circumflex 2.25 x 22 mm DES He is on dual antiplatelet therapy aspirin and Plavix Beta-blocker, isosorbide, statin, lisinopril  2. Persistent Afib/flutter: -Rate well controlled on Toprol and digoxin -Not on full dose anticoagulation given recurrent and recent GI bleed   3. ICM/chronic systolic CHF: -Continue Toprol and lisinopril  Blood pressure low will hold on adding spironolactone, consider as an outpatient Recent ejection fraction October 2018 35-40%  4. History of bioprosthetic aortic valve: -Stable by recent echo in 04/2017  5. HTN:  Blood pressure running borderline low will decrease Toprol down to 100 mg this morning with his ACE inhibitor and isosorbide Commended he monitor blood pressure at home and call our office if blood pressure runs  low   Total encounter time more than 25 minutes  Greater than 50% was spent in counseling and coordination of care with the patient   For questions or updates, please contact [facility] Please consult [email or web address] for contact info under Cardiology/STEMI.    Signed, Dossie Arbourim Gollan, MD, Ph.D [facility]

## 2017-05-09 NOTE — Progress Notes (Addendum)
Per CCMD pt now on Normal Sinus Rhythm. Notified MD Mariah MillingGollan and Kalisetti. Dr.Gollan and I took a look at the cardiac strip. MD stated it seemed like pt was in Aflutter on a slow rate. Will continue to monitor.

## 2017-05-09 NOTE — Care Management (Signed)
.  Is not being discharged home on cost prohibitive antiplatelet medication 

## 2017-05-09 NOTE — Discharge Summary (Signed)
[place] at [place] NAME: Christopher Cummings    MR#:  [id number]  DATE OF BIRTH:  1950-09-09  DATE OF ADMISSION:  05/07/2017   ADMITTING PHYSICIAN: Lance Coon, MD  DATE OF DISCHARGE: 05/09/17  PRIMARY CARE PHYSICIAN: [name], NP   ADMISSION DIAGNOSIS:   Unstable angina pectoris ([place]) [I20.0] NSTEMI (non-ST elevated myocardial infarction) ([place]) [I21.4]  DISCHARGE DIAGNOSIS:   Principal Problem:   NSTEMI (non-ST elevated myocardial infarction) ([place]) Active Problems:   HTN (hypertension)   Diabetes ([place])   CAD (coronary artery disease)   SECONDARY DIAGNOSIS:   Past Medical History:  Diagnosis Date  . Acute systolic heart failure ([place])   . CAD (coronary artery disease)    10/18 ISR to the p/d LCX, and ostium of diag treated with cutting balloon. EF 35%  . Colitis   . Diabetes mellitus without complication ([place])   . Diverticulitis   . Hyperlipidemia   . Hypertension     HOSPITAL COURSE:   66 y/o M with PMH of HTN, CAD s/p CABG and stents, bio prosthetic aortic valve replacement, chronic systolic CHF, persistent afib not on anticoagulation due to h/o GI bleed presents secondary to chest pain and elevated troponin.  #1  NSTEMI- troponin is elevated but plateaued -  has known CAD and recent cath with PCI in oct 2018 - s/o cath and has stent restenosis and PCI -Appreciate cardiology consult.was on heparin drip -Continue aspirin and Plavix. On Imdur and Toprol and Lipitor -Ambulated well today. No further left arm pain. -Will be discharged.  #2 chronic systolic CHF-well compensated at this time. -Lasix as needed  #3 chronic A. fib-on digoxin, Toprol. -not on chronic anticoagulation due to history of GI bleed. Continue aspirin -Management per cardiology  #4 diabetes mellitus-continue metformin at discharge, dose needs to be increased. Primary care physician address that as patient has a sensitive stomach  and metformin can cause diarrhea. -Sugars were well controlled with only 10 units of Lantus in the hospital. Low-carb diet recommended.    DISCHARGE CONDITIONS:   Guarded  CONSULTS OBTAINED:   Treatment Team:  Minna Merritts, MD  DRUG ALLERGIES:   Allergies  Allergen Reactions  . Metformin And Related Other (See Comments)    Only the regular Metformin (diarrhea)   DISCHARGE MEDICATIONS:   Allergies as of 05/09/2017      Reactions   Metformin And Related Other (See Comments)   Only the regular Metformin (diarrhea)      Medication List    TAKE these medications   acetaminophen 325 MG tablet Commonly known as:  TYLENOL Take 2 tablets (650 mg total) by mouth every 6 (six) hours as needed for mild pain (or Fever >/= 101).   aspirin EC 81 MG tablet Take 162 mg by mouth daily.   atorvastatin 80 MG tablet Commonly known as:  LIPITOR Take 1 tablet (80 mg total) by mouth at bedtime.   blood glucose meter kit and supplies Dispense based on patient and insurance preference. Use 1 time daily as directed. (FOR ICD-9 250.00, 250.01).   Blood Pressure Cuff Misc 1 Units by Does not apply route daily.   clopidogrel 75 MG tablet Commonly known as:  PLAVIX Take 75 mg by mouth daily.   digoxin 0.125 MG tablet Commonly known as:  LANOXIN Take 0.5 tablets (0.0625 mg total) by mouth daily.   isosorbide mononitrate 30 MG 24 hr tablet Commonly known as:  IMDUR Take 0.5  tablets (15 mg total) by mouth daily.   lisinopril 10 MG tablet Commonly known as:  PRINIVIL,ZESTRIL Take 10 mg by mouth at bedtime.   meclizine 25 MG tablet Commonly known as:  ANTIVERT Take 25 mg by mouth 3 (three) times daily as needed for dizziness.   metFORMIN 500 MG 24 hr tablet Commonly known as:  GLUCOPHAGE-XR Take 500 mg by mouth.   metoprolol succinate 100 MG 24 hr tablet Commonly known as:  TOPROL-XL Take 1 tablet (100 mg total) daily by mouth. Take with or immediately following a  meal. What changed:    medication strength  how much to take   nitroGLYCERIN 0.4 MG SL tablet Commonly known as:  NITROSTAT Place 1 tablet (0.4 mg total) under the tongue every 5 (five) minutes as needed for chest pain.   pantoprazole 40 MG tablet Commonly known as:  PROTONIX Take 1 tablet (40 mg total) by mouth daily.   ranolazine 500 MG 12 hr tablet Commonly known as:  RANEXA Take 1 tablet (500 mg total) by mouth 2 (two) times daily.        DISCHARGE INSTRUCTIONS:   1. PCP follow-up in 1-2 weeks 2. Cardiology follow-up in 1 week  DIET:   Cardiac diet  ACTIVITY:   Activity as tolerated  OXYGEN:   Home Oxygen: No.  Oxygen Delivery: room air  DISCHARGE LOCATION:   home   If you experience worsening of your admission symptoms, develop shortness of breath, life threatening emergency, suicidal or homicidal thoughts you must seek medical attention immediately by calling 911 or calling your MD immediately  if symptoms less severe.  You Must read complete instructions/literature along with all the possible adverse reactions/side effects for all the Medicines you take and that have been prescribed to you. Take any new Medicines after you have completely understood and accpet all the possible adverse reactions/side effects.   Please note  You were cared for by a hospitalist during your hospital stay. If you have any questions about your discharge medications or the care you received while you were in the hospital after you are discharged, you can call the unit and asked to speak with the hospitalist on call if the hospitalist that took care of you is not available. Once you are discharged, your primary care physician will handle any further medical issues. Please note that NO REFILLS for any discharge medications will be authorized once you are discharged, as it is imperative that you return to your primary care physician (or establish a relationship with a primary care  physician if you do not have one) for your aftercare needs so that they can reassess your need for medications and monitor your lab values.    On the day of Discharge:  VITAL SIGNS:   Blood pressure (!) 108/56, pulse 66, temperature 98 F (36.7 C), temperature source Oral, resp. rate 17, height 5' 10.5" (1.791 m), weight 102.5 kg (226 lb), SpO2 96 %.  PHYSICAL EXAMINATION:    GENERAL:  66 y.o.-year-old patient lying in the bed with no acute distress.  EYES: Pupils equal, round, reactive to light and accommodation. No scleral icterus. Extraocular muscles intact.  HEENT: Head atraumatic, normocephalic. Oropharynx and nasopharynx clear.  NECK:  Supple, no jugular venous distention. No thyroid enlargement, no tenderness.  LUNGS: Normal breath sounds bilaterally, no wheezing, rales,rhonchi or crepitation. No use of accessory muscles of respiration.  CARDIOVASCULAR: S1, S2 normal. No   rubs, or gallops. 3/6 systolic murmur present ABDOMEN: Soft, nontender,  nondistended. Bowel sounds present. No organomegaly or mass.  EXTREMITIES: No pedal edema, cyanosis, or clubbing.  NEUROLOGIC: Cranial nerves II through XII are intact. Muscle strength 5/5 in all extremities. Sensation intact. Gait not checked.  PSYCHIATRIC: The patient is alert and oriented x 3.  SKIN: No obvious rash, lesion, or ulcer.     DATA REVIEW:   CBC Recent Labs  Lab 05/09/17 0339  WBC 9.3  HGB 12.3*  HCT 37.2*  PLT 217    Chemistries  Recent Labs  Lab 05/09/17 0339  NA 138  K 3.7  CL 102  CO2 28  GLUCOSE 138*  BUN 16  CREATININE 0.75  CALCIUM 9.0     Microbiology Results  Results for orders placed or performed during the hospital encounter of 03/31/17  MRSA PCR Screening     Status: None   Collection Time: 03/31/17  8:45 PM  Result Value Ref Range Status   MRSA by PCR NEGATIVE NEGATIVE Final    Comment:        The GeneXpert MRSA Assay (FDA approved for NASAL specimens only), is one component of  a comprehensive MRSA colonization surveillance program. It is not intended to diagnose MRSA infection nor to guide or monitor treatment for MRSA infections.     RADIOLOGY:  No results found.   Management plans discussed with the patient, family and they are in agreement.  CODE STATUS:     Code Status Orders  (From admission, onward)        Start     Ordered   05/07/17 0250  Full code  Continuous     05/07/17 0249    Code Status History    Date Active Date Inactive Code Status Order ID Comments User Context   04/24/2017 00:35 04/26/2017 18:44 Full Code [id number]  Lance Coon, MD Inpatient   03/31/2017 20:25 04/03/2017 18:53 Full Code [id number]  Flossie Dibble, MD Inpatient   03/13/2017 01:41 03/14/2017 15:32 Full Code [id number]  Lance Coon, MD Inpatient   02/24/2017 04:20 02/27/2017 14:10 Full Code [id number]  Harrie Foreman, MD Inpatient      TOTAL TIME TAKING CARE OF THIS PATIENT: 38 minutes.    Gladstone Lighter M.D on 05/09/2017 at 10:47 AM  Between 7am to 6pm - Pager - [phone]  After 6pm go to [email or web address] - password [email or web address] [facility]  [facility] [place] [facility]  Office  [phone]  CC: Primary care physician; [name], NP   Note: This dictation was prepared with Dragon dictation along with smaller phrase technology. Any transcriptional errors that result from this process are unintentional.

## 2017-05-09 NOTE — Progress Notes (Signed)
Rounded with Dr.Gollan and patient. Per MD give 100mg  of metoprolol instead of 150mg . Pt agrees, no compliants. Will continue to monitor.

## 2017-05-09 NOTE — Plan of Care (Signed)
  Adequate for Discharge Clinical Measurements: Ability to maintain clinical measurements within normal limits will improve 05/09/2017 1231 - Adequate for Discharge by Erma HeritageAlejo Calderon, Davontae Prusinski, RN Diagnostic test results will improve 05/09/2017 1231 - Adequate for Discharge by Erma HeritageAlejo Calderon, Nicolasa Milbrath, RN Respiratory complications will improve 05/09/2017 1231 - Adequate for Discharge by Erma HeritageAlejo Calderon, Ramelo Oetken, RN Cardiovascular complication will be avoided 05/09/2017 1231 - Adequate for Discharge by Erma HeritageAlejo Calderon, Hannelore Bova, RN Nutrition: Adequate nutrition will be maintained 05/09/2017 1231 - Adequate for Discharge by Erma HeritageAlejo Calderon, Jennaya Pogue, RN Coping: Level of anxiety will decrease 05/09/2017 1231 - Adequate for Discharge by Weldon PickingAlejo Calderon, Manuella GhaziBerenice, RN Elimination: Will not experience complications related to bowel motility 05/09/2017 1231 - Adequate for Discharge by Weldon PickingAlejo Calderon, Manuella GhaziBerenice, RN Will not experience complications related to urinary retention 05/09/2017 1231 - Adequate for Discharge by Weldon PickingAlejo Calderon, Manuella GhaziBerenice, RN Skin Integrity: Risk for impaired skin integrity will decrease 05/09/2017 1231 - Adequate for Discharge by Erma HeritageAlejo Calderon, Kaylany Tesoriero, RN Education: Understanding of cardiac disease, CV risk reduction, and recovery process will improve 05/09/2017 1231 - Adequate for Discharge by Weldon PickingAlejo Calderon, Manuella GhaziBerenice, RN Understanding of medication regimen will improve 05/09/2017 1231 - Adequate for Discharge by Erma HeritageAlejo Calderon, Analucia Hush, RN Activity: Ability to tolerate increased activity will improve 05/09/2017 1231 - Adequate for Discharge by Erma HeritageAlejo Calderon, Classie Weng, RN Cardiac: Ability to achieve and maintain adequate cardiopulmonary perfusion will improve 05/09/2017 1231 - Adequate for Discharge by Erma HeritageAlejo Calderon, Kaio Kuhlman, RN Vascular access site(s) Level 0-1 will be maintained 05/09/2017 1231 - Adequate for Discharge by Erma HeritageAlejo Calderon, Waldine Zenz, RN Health  Behavior/Discharge Planning: Ability to safely manage health-related needs after discharge will improve 05/09/2017 1231 - Adequate for Discharge by Erma HeritageAlejo Calderon, Roger Kettles, RN Education: Understanding of CV disease, CV risk reduction, and recovery process will improve 05/09/2017 1231 - Adequate for Discharge by Erma HeritageAlejo Calderon, Kaymon Denomme, RN Activity: Ability to return to baseline activity level will improve 05/09/2017 1231 - Adequate for Discharge by Erma HeritageAlejo Calderon, Tijuana Scheidegger, RN Cardiovascular: Ability to achieve and maintain adequate cardiovascular perfusion will improve 05/09/2017 1231 - Adequate for Discharge by Erma HeritageAlejo Calderon, Gyselle Matthew, RN Vascular access site(s) Level 0-1 will be maintained 05/09/2017 1231 - Adequate for Discharge by Erma HeritageAlejo Calderon, Deshane Cotroneo, RN Health Behavior/Discharge Planning: Ability to safely manage health-related needs after discharge will improve 05/09/2017 1231 - Adequate for Discharge by Erma HeritageAlejo Calderon, Axton Cihlar, RN

## 2017-05-09 NOTE — Progress Notes (Addendum)
Rounded on patient.  Patient is known to this RN as patient has been admitted three times in the last 1.5 months.  Patient admitted with dx of unstable angina and NSTEMI.  Patient with history of HTN, DM, CAD, CABG x 1, AVR,  HLD, CHF - systolic heart failure.  Recent cath with PCI in October 2018.   S/P cath on 05/08/2017 with findings of stent re-stenosis and PCI.    This RN had previously given and reviewed with this patient the "Heart Attack Bouncing Back" booklet.  Discussed modifiable risk factors including controlling blood pressure, cholesterol, and blood sugar; following heart healthy diet; maintaining healthy weight; exercise; and smoking cessation if applicable. Patient is a non-smoker.    ? Discussed cardiac medications including rationale for taking, mechanisms of action, and side effects. ?  Discussed emergency plan for heart attack symptoms.  Discussed when to use NTG. ?Patient is now on Imdur.  Patient verbalized understanding of need to call 911 and not to drive himself to ER if having cardiac symptoms / chest pain.  Also, discussed with patient need to call 911 instead of going through the answering service for his cardiologists at night.  I reminded the patient with his CAD and CHF, time is muscle when his chest pain / anginal symptoms are not relieved by NTG.    Heart healthy diet - low sodium, low fat, low cholesterol heart healthy diet discussed. ?Information on diet provided. ?  Exercise - Benefits of exercise discussed. Patient had been previously referred to Cardiac Rehab and had been scheduled for orientation to the program, but had to cancel due to readmission to the hospital.   Patient rescheduled for Cardiac Rehab orientation on Monday, May 21, 2017 at 10:30 a.m.    Patient had previously been scheduled for a new patient appointment in the HF Clinic in early November, but patient unable to attend due to his sister's car not working properly.  Patient does not drive due  to vertigo.  He relies on his brother-in-law and sister for transportation.    Heart Failure Education: *Reviewed importance of and reason behind checking weight daily in the AM, after using the bathroom, but before getting dressed. Note: Patient informed this RN that he does not have any scales nor any means to purchase them at this time, as he is on a fixed income and just purchased a BP cuff to check his BP.   Will request scales from Care Manager.   *Discussed when to call the Dr= weight gain of >2lb overnight of 5lb in a week,  *Discussed yellow zone= call MD: weight gain of >2lb overnight of 5lb in a week, increased swelling, increased SOB when lying down, chest discomfort, dizziness, increased fatigue *Red Zone= call 911: struggle to breath, fainting or near fainting, significant chest pain  *Reviewed low sodium diet-provided handout of recommended and not recommended foods.  *Instructed patient to take medications as prescribed for heart failure. Explained briefly why pt is on the medications (either make you feel better, live longer or keep you out of the hospital) and discussed monitoring and side effects.  *Discussed exercise.  See above.   *Smoking Cessation - Patient is a non-smoker.   *Provided information on [facility].  Purpose of [facility] was explained to patient.  I stressed to patient that the HF Clinic does not replace his PCP or Cardiologist, but is an additional resource for help in managing his heart failure. Patient would  like appointment in the HF Clinic.  [facility] HF Clinic Appointment made for 05/11/2017 at 12:40 p.m.    Brochures for Cardiac Rehab and [facility] with appointment times given to patient.    Patient thanked this nurse for providing the above information.    Army Meliaiane Chestina Komatsu, RN, BSN, [facility] Cardiovascular and Pulmonary Nurse Navigator

## 2017-05-09 NOTE — Progress Notes (Signed)
Cardiac cath performed yesterday. Stent installed in circumflex artery.

## 2017-05-09 NOTE — Plan of Care (Signed)
  Completed/Met Education: Knowledge of General Education information will improve 05/09/2017 1230 - Completed/Met by Feliberto Gottron, RN Health Behavior/Discharge Planning: Ability to manage health-related needs will improve 05/09/2017 1230 - Completed/Met by Chriss Czar, Illene Bolus, RN Activity: Risk for activity intolerance will decrease 05/09/2017 1230 - Completed/Met by Feliberto Gottron, RN Pain Managment: General experience of comfort will improve 05/09/2017 1230 - Completed/Met by Feliberto Gottron, RN Safety: Ability to remain free from injury will improve 05/09/2017 1230 - Completed/Met by Feliberto Gottron, RN

## 2017-05-09 NOTE — Plan of Care (Signed)
Adequate for Discharge Clinical Measurements: Ability to maintain clinical measurements within normal limits will improve 05/09/2017 1231 - Adequate for Discharge by Erma HeritageAlejo Calderon, Tishawn Friedhoff, RN 05/09/2017 1231 - Adequate for Discharge by Erma HeritageAlejo Calderon, Jaria Conway, RN Diagnostic test results will improve 05/09/2017 1231 - Adequate for Discharge by Erma HeritageAlejo Calderon, Noelle Sease, RN 05/09/2017 1231 - Adequate for Discharge by Weldon PickingAlejo Calderon, Manuella GhaziBerenice, RN Respiratory complications will improve 05/09/2017 1231 - Adequate for Discharge by Erma HeritageAlejo Calderon, Kellis Mcadam, RN 05/09/2017 1231 - Adequate for Discharge by Weldon PickingAlejo Calderon, Manuella GhaziBerenice, RN Cardiovascular complication will be avoided 05/09/2017 1231 - Adequate for Discharge by Erma HeritageAlejo Calderon, Gertrude Bucks, RN 05/09/2017 1231 - Adequate for Discharge by Weldon PickingAlejo Calderon, Manuella GhaziBerenice, RN Nutrition: Adequate nutrition will be maintained 05/09/2017 1231 - Adequate for Discharge by Erma HeritageAlejo Calderon, Brynlei Klausner, RN 05/09/2017 1231 - Adequate for Discharge by Weldon PickingAlejo Calderon, Manuella GhaziBerenice, RN Coping: Level of anxiety will decrease 05/09/2017 1231 - Adequate for Discharge by Erma HeritageAlejo Calderon, Foy Mungia, RN 05/09/2017 1231 - Adequate for Discharge by Weldon PickingAlejo Calderon, Manuella GhaziBerenice, RN Elimination: Will not experience complications related to bowel motility 05/09/2017 1231 - Adequate for Discharge by Erma HeritageAlejo Calderon, Quatavious Rossa, RN 05/09/2017 1231 - Adequate for Discharge by Weldon PickingAlejo Calderon, Manuella GhaziBerenice, RN Will not experience complications related to urinary retention 05/09/2017 1231 - Adequate for Discharge by Erma HeritageAlejo Calderon, Jamirra Curnow, RN 05/09/2017 1231 - Adequate for Discharge by Weldon PickingAlejo Calderon, Manuella GhaziBerenice, RN Skin Integrity: Risk for impaired skin integrity will decrease 05/09/2017 1231 - Adequate for Discharge by Erma HeritageAlejo Calderon, Teagen Bucio, RN 05/09/2017 1231 - Adequate for Discharge by Weldon PickingAlejo Calderon, Manuella GhaziBerenice, RN Education: Understanding of cardiac disease, CV risk reduction, and recovery process  will improve 05/09/2017 1231 - Adequate for Discharge by Erma HeritageAlejo Calderon, Coty Larsh, RN 05/09/2017 1231 - Adequate for Discharge by Weldon PickingAlejo Calderon, Manuella GhaziBerenice, RN Understanding of medication regimen will improve 05/09/2017 1231 - Adequate for Discharge by Erma HeritageAlejo Calderon, Willies Laviolette, RN 05/09/2017 1231 - Adequate for Discharge by Weldon PickingAlejo Calderon, Manuella GhaziBerenice, RN Activity: Ability to tolerate increased activity will improve 05/09/2017 1231 - Adequate for Discharge by Erma HeritageAlejo Calderon, Laden Fieldhouse, RN 05/09/2017 1231 - Adequate for Discharge by Weldon PickingAlejo Calderon, Manuella GhaziBerenice, RN Cardiac: Ability to achieve and maintain adequate cardiopulmonary perfusion will improve 05/09/2017 1231 - Adequate for Discharge by Erma HeritageAlejo Calderon, Karena Kinker, RN 05/09/2017 1231 - Adequate for Discharge by Weldon PickingAlejo Calderon, Manuella GhaziBerenice, RN Vascular access site(s) Level 0-1 will be maintained 05/09/2017 1231 - Adequate for Discharge by Erma HeritageAlejo Calderon, Sacha Topor, RN 05/09/2017 1231 - Adequate for Discharge by Weldon PickingAlejo Calderon, Manuella GhaziBerenice, RN Health Behavior/Discharge Planning: Ability to safely manage health-related needs after discharge will improve 05/09/2017 1231 - Adequate for Discharge by Erma HeritageAlejo Calderon, Gissel Keilman, RN 05/09/2017 1231 - Adequate for Discharge by Weldon PickingAlejo Calderon, Manuella GhaziBerenice, RN Education: Understanding of CV disease, CV risk reduction, and recovery process will improve 05/09/2017 1231 - Adequate for Discharge by Erma HeritageAlejo Calderon, Zaiyden Strozier, RN 05/09/2017 1231 - Adequate for Discharge by Weldon PickingAlejo Calderon, Manuella GhaziBerenice, RN Activity: Ability to return to baseline activity level will improve 05/09/2017 1231 - Adequate for Discharge by Erma HeritageAlejo Calderon, Kariyah Baugh, RN 05/09/2017 1231 - Adequate for Discharge by Erma HeritageAlejo Calderon, Omauri Boeve, RN Cardiovascular: Ability to achieve and maintain adequate cardiovascular perfusion will improve 05/09/2017 1231 - Adequate for Discharge by Erma HeritageAlejo Calderon, Jaykwon Morones, RN 05/09/2017 1231 - Adequate for Discharge by Erma HeritageAlejo Calderon,  Trenda Corliss, RN Vascular access site(s) Level 0-1 will be maintained 05/09/2017 1231 - Adequate for Discharge by Erma HeritageAlejo Calderon, Fiza Nation, RN 05/09/2017 1231 - Adequate for Discharge by Erma HeritageAlejo Calderon, Elon Eoff, RN Health Behavior/Discharge Planning: Ability to safely manage health-related needs after discharge will  improve 05/09/2017 1231 - Adequate for Discharge by Erma HeritageAlejo Calderon, Navjot Loera, RN 05/09/2017 1231 - Adequate for Discharge by Erma HeritageAlejo Calderon, Kayde Warehime, RN

## 2017-05-09 NOTE — Care Management (Signed)
Informed by CV navigator that patient is not able to purchase scales. Provided a set

## 2017-05-10 ENCOUNTER — Telehealth: Payer: Self-pay | Admitting: Cardiovascular Disease

## 2017-05-10 NOTE — Telephone Encounter (Signed)
Attempted TCM call again. No answer, no voice mail on home or cell phone.

## 2017-05-10 NOTE — Telephone Encounter (Signed)
Lazaro ArmsGilley, Sabrina F  P [facility] Triage        11-19 2:30  Eula Listenyan [place], [place]    Attempted TCM call. Male who answered phone states pt is still in bed. Will call again

## 2017-05-11 ENCOUNTER — Ambulatory Visit: Payer: BLUE CROSS/BLUE SHIELD | Attending: Family | Admitting: Family

## 2017-05-11 ENCOUNTER — Other Ambulatory Visit: Payer: Self-pay

## 2017-05-11 ENCOUNTER — Encounter: Payer: Self-pay | Admitting: Family

## 2017-05-11 VITALS — BP 101/56 | HR 67 | Resp 18 | Ht 70.0 in | Wt 228.4 lb

## 2017-05-11 DIAGNOSIS — Z8042 Family history of malignant neoplasm of prostate: Secondary | ICD-10-CM | POA: Diagnosis not present

## 2017-05-11 DIAGNOSIS — Z8249 Family history of ischemic heart disease and other diseases of the circulatory system: Secondary | ICD-10-CM | POA: Diagnosis not present

## 2017-05-11 DIAGNOSIS — Z888 Allergy status to other drugs, medicaments and biological substances status: Secondary | ICD-10-CM | POA: Insufficient documentation

## 2017-05-11 DIAGNOSIS — Z951 Presence of aortocoronary bypass graft: Secondary | ICD-10-CM | POA: Diagnosis not present

## 2017-05-11 DIAGNOSIS — I2510 Atherosclerotic heart disease of native coronary artery without angina pectoris: Secondary | ICD-10-CM | POA: Insufficient documentation

## 2017-05-11 DIAGNOSIS — E785 Hyperlipidemia, unspecified: Secondary | ICD-10-CM | POA: Insufficient documentation

## 2017-05-11 DIAGNOSIS — I5022 Chronic systolic (congestive) heart failure: Secondary | ICD-10-CM | POA: Diagnosis not present

## 2017-05-11 DIAGNOSIS — I10 Essential (primary) hypertension: Secondary | ICD-10-CM

## 2017-05-11 DIAGNOSIS — I25118 Atherosclerotic heart disease of native coronary artery with other forms of angina pectoris: Secondary | ICD-10-CM

## 2017-05-11 DIAGNOSIS — Z7982 Long term (current) use of aspirin: Secondary | ICD-10-CM | POA: Diagnosis not present

## 2017-05-11 DIAGNOSIS — R0602 Shortness of breath: Secondary | ICD-10-CM | POA: Insufficient documentation

## 2017-05-11 DIAGNOSIS — Z952 Presence of prosthetic heart valve: Secondary | ICD-10-CM | POA: Insufficient documentation

## 2017-05-11 DIAGNOSIS — I110 Hypertensive heart disease with heart failure: Secondary | ICD-10-CM | POA: Insufficient documentation

## 2017-05-11 DIAGNOSIS — Z8489 Family history of other specified conditions: Secondary | ICD-10-CM | POA: Insufficient documentation

## 2017-05-11 DIAGNOSIS — E119 Type 2 diabetes mellitus without complications: Secondary | ICD-10-CM | POA: Insufficient documentation

## 2017-05-11 DIAGNOSIS — Z9889 Other specified postprocedural states: Secondary | ICD-10-CM | POA: Diagnosis not present

## 2017-05-11 DIAGNOSIS — Z7984 Long term (current) use of oral hypoglycemic drugs: Secondary | ICD-10-CM | POA: Diagnosis not present

## 2017-05-11 DIAGNOSIS — R42 Dizziness and giddiness: Secondary | ICD-10-CM | POA: Diagnosis not present

## 2017-05-11 DIAGNOSIS — Z79899 Other long term (current) drug therapy: Secondary | ICD-10-CM | POA: Insufficient documentation

## 2017-05-11 NOTE — Progress Notes (Signed)
Patient ID: Christopher Ivory Sr., male    DOB: March 05, 1951, 66 y.o.   MRN: [id number]  HPI  Christopher Cummings is a 66 y/o male with a history of CAD, colitis, DM, diverticulitis, hyperlipidemia, HTN, remote alcohol use and chronic heart failure.   Echo report from 04/02/17 reviewed and showed an EF of 35-40% along with mild Christopher/TR. PCI with drug-eluting stent placed 05/08/17 in the proximal LCX. Cardiac catheterization done 04/02/17 showed multivessel disease with balloon intervention done.   Admitted 05/07/17 due to NSETMI. Cardiology consult obtained. Catheterization done per above. Discharged home after 2 days. Admitted 04/23/17 due to chest pain. Cardiology consult obtained. Medications were adjusted and he was was discharged home after 3 days. Admitted 03/31/17 due to neck/arm/chest pain. Cardiology consult was obtained with cutting balloon intervention done. Discharged home after 3 days. Admitted 03/12/17 due to rectal bleeding. GI consult obtained. Colonoscopy scheduled as an outpatient and he was discharged home after 2 days.   He presents today for his initial visit with a chief complaint of moderate fatigue with minimal exertion. He describes this as acute over the last few weeks. He has associated shortness of breath and light-headedness along with this. He denies any chest pain, edema, palpitations, weight gain or difficulty sleeping.   Past Medical History:  Diagnosis Date  . Acute systolic heart failure ([place])   . CAD (coronary artery disease)    10/18 ISR to the p/d LCX, and ostium of diag treated with cutting balloon. EF 35%  . Colitis   . Diabetes mellitus without complication ([place])   . Diverticulitis   . Hyperlipidemia   . Hypertension    Past Surgical History:  Procedure Laterality Date  . AORTIC VALVE REPLACEMENT    . CORONARY ARTERY BYPASS GRAFT    . JOINT REPLACEMENT Left    KNEE  . KNEE ARTHROPLASTY  2000   Family History  Problem Relation Age of Onset  . CAD Mother   . Heart  failure Mother   . Lupus Mother   . CAD Brother   . Heart attack Brother   . Prostate cancer Father   . Diabetes Sister   . Healthy Paternal Grandmother   . Prostate cancer Paternal Grandfather   . Healthy Brother   . Healthy Sister    Social History   Tobacco Use  . Smoking status: Never Smoker  . Smokeless tobacco: Never Used  Substance Use Topics  . Alcohol use: No    Comment: No Alcohol 12 years, but drank 2 "bottles of liquor" each week for several years prior to quitting   Allergies  Allergen Reactions  . Metformin And Related Other (See Comments)    Only the regular Metformin (diarrhea)   Prior to Admission medications   Medication Sig Start Date End Date Taking? Authorizing Provider  acetaminophen (TYLENOL) 325 MG tablet Take 2 tablets (650 mg total) by mouth every 6 (six) hours as needed for mild pain (or Fever >/= 101). 04/26/17  Yes Gouru, Illene Silver, MD  aspirin EC 81 MG tablet Take 162 mg by mouth daily.    Yes [provider]  atorvastatin (LIPITOR) 80 MG tablet Take 1 tablet (80 mg total) by mouth at bedtime. 04/30/17  Yes [name], NP  blood glucose meter kit and supplies Dispense based on patient and insurance preference. Use 1 time daily as directed. (FOR ICD-9 250.00, 250.01). 04/30/17  Yes [name], NP  Blood Pressure Monitoring (BLOOD PRESSURE CUFF) MISC 1 Units by  Does not apply route daily. 04/30/17  Yes [name], NP  clopidogrel (PLAVIX) 75 MG tablet Take 75 mg by mouth daily.   Yes [provider]  digoxin (LANOXIN) 0.125 MG tablet Take 0.5 tablets (0.0625 mg total) by mouth daily. 04/26/17 04/26/18 Yes Gouru, Illene Silver, MD  isosorbide mononitrate (IMDUR) 30 MG 24 hr tablet Take 0.5 tablets (15 mg total) by mouth daily. 04/27/17  Yes Gouru, Illene Silver, MD  lisinopril (PRINIVIL,ZESTRIL) 10 MG tablet Take 10 mg by mouth at bedtime. 02/20/17  Yes [provider]  meclizine (ANTIVERT) 25 MG tablet Take 25 mg  by mouth 3 (three) times daily as needed for dizziness.   Yes [provider]  metFORMIN (GLUCOPHAGE-XR) 500 MG 24 hr tablet Take 500 mg by mouth. 03/22/17 03/22/18 Yes [provider]  metoprolol succinate (TOPROL-XL) 100 MG 24 hr tablet Take 1 tablet (100 mg total) daily by mouth. Take with or immediately following a meal. 05/09/17  Yes Gladstone Lighter, MD  nitroGLYCERIN (NITROSTAT) 0.4 MG SL tablet Place 1 tablet (0.4 mg total) under the tongue every 5 (five) minutes as needed for chest pain. 04/16/17  Yes Wellington Hampshire, MD  pantoprazole (PROTONIX) 40 MG tablet Take 1 tablet (40 mg total) by mouth daily. 04/30/17  Yes [name], NP  ranolazine (RANEXA) 500 MG 12 hr tablet Take 1 tablet (500 mg total) by mouth 2 (two) times daily. 04/26/17  Yes Nicholes Mango, MD    Review of Systems  Constitutional: Positive for fatigue. Negative for appetite change.  HENT: Positive for congestion. Negative for postnasal drip and sore throat.   Eyes: Negative.   Respiratory: Positive for shortness of breath. Negative for cough and chest tightness.   Cardiovascular: Negative for chest pain, palpitations and leg swelling.  Gastrointestinal: Negative for abdominal distention and abdominal pain.  Endocrine: Negative.   Genitourinary: Negative.   Musculoskeletal: Negative for back pain and neck pain.  Skin: Negative.   Allergic/Immunologic: Negative.   Neurological: Positive for light-headedness. Negative for dizziness.  Hematological: Negative for adenopathy. Bruises/bleeds easily.  Psychiatric/Behavioral: Negative for dysphoric mood and sleep disturbance. The patient is not nervous/anxious.    Vitals:   05/11/17 1328  BP: (!) 101/56  Pulse: 67  Resp: 18  SpO2: 98%  Weight: 228 lb 6 oz (103.6 kg)  Height: _0  (1.778 m)   Wt Readings from Last 3 Encounters:  05/11/17 228 lb 6 oz (103.6 kg)  05/09/17 226 lb (102.5 kg)  04/30/17 230 lb 9.6 oz (104.6 kg)   Lab  Results  Component Value Date   CREATININE 0.75 05/09/2017   CREATININE 0.72 05/07/2017   CREATININE 0.84 05/07/2017   Physical Exam  Constitutional: He is oriented to person, place, and time. He appears well-developed and well-nourished.  HENT:  Head: Normocephalic and atraumatic.  Neck: Normal range of motion. Neck supple. No JVD present.  Cardiovascular: Normal rate and regular rhythm.  Pulmonary/Chest: Effort normal. He has no wheezes. He has no rales.  Abdominal: Soft. He exhibits no distension. There is no tenderness.  Musculoskeletal: He exhibits no edema or tenderness.  Neurological: He is alert and oriented to person, place, and time.  Skin: Skin is warm and dry.  Psychiatric: He has a normal mood and affect. His behavior is normal. Thought content normal.  Nursing note and vitals reviewed.  Assessment & Plan:  1: Chronic heart failure with reduced ejection fraction- - NYHA class III - euvolemic today - already weighing daily and says  that his weight has been stable. Instructed to call for an overnight weight gain of >2 pounds or a weekly weight gain of >5 pounds - not adding salt to his food. Discussed the importance of reading food labels so that he can closely follow a 2065m sodium diet. Written dietary information was given to him about this.  - discussed changing his lisinopril to entresto at future visits if his BP allows - could titrate up his metoprolol at future visits if his HR allows - patient reports receiving his flu vaccine for this season already - has follow-up with cardiology (Dunn) 05/21/17  2: HTN-  - BP on the low side today - BMP from 05/09/17 reviewed and shows sodium 138, potassium 3.7 and GFR >60 - saw PCP (Merrilyn Puma 04/30/17  3: CAD- - has had recent intervention done - sees cardiologist (Fletcher Anon December 2018  Medication bottles were reviewed.  Return in 2 months or sooner for any questions/problems before then.

## 2017-05-11 NOTE — Patient Instructions (Signed)
Continue weighing daily and call for an overnight weight gain of > 2 pounds or a weekly weight gain of >5 pounds. 

## 2017-05-12 DIAGNOSIS — I5022 Chronic systolic (congestive) heart failure: Secondary | ICD-10-CM | POA: Insufficient documentation

## 2017-05-14 ENCOUNTER — Telehealth: Payer: Self-pay | Admitting: Physician Assistant

## 2017-05-14 NOTE — Telephone Encounter (Signed)
Pt called back.  [facility].  Patient understands to follow up with provider Eula Listenyan Dunn on 11[facility]19 [facility] [place]. Patient understands discharge instructions? yesy Patient understands medications and regiment? yes Patient understands to bring all medications to this visit? yes

## 2017-05-14 NOTE — Telephone Encounter (Signed)
Attempted TCM call. No answer, no voice mail set up on home or cell phone. Letter mailed.

## 2017-05-15 ENCOUNTER — Ambulatory Visit: Payer: Medicaid Other | Admitting: Physician Assistant

## 2017-05-15 ENCOUNTER — Encounter: Payer: Self-pay | Admitting: Nurse Practitioner

## 2017-05-15 ENCOUNTER — Ambulatory Visit: Payer: BLUE CROSS/BLUE SHIELD | Admitting: Nurse Practitioner

## 2017-05-15 ENCOUNTER — Other Ambulatory Visit: Payer: Self-pay

## 2017-05-15 VITALS — BP 94/52 | HR 69 | Temp 97.7°F | Resp 16 | Ht 67.5 in | Wt 228.0 lb

## 2017-05-15 DIAGNOSIS — E1159 Type 2 diabetes mellitus with other circulatory complications: Secondary | ICD-10-CM | POA: Diagnosis not present

## 2017-05-15 DIAGNOSIS — K219 Gastro-esophageal reflux disease without esophagitis: Secondary | ICD-10-CM

## 2017-05-15 DIAGNOSIS — I25118 Atherosclerotic heart disease of native coronary artery with other forms of angina pectoris: Secondary | ICD-10-CM | POA: Diagnosis not present

## 2017-05-15 DIAGNOSIS — Z9289 Personal history of other medical treatment: Secondary | ICD-10-CM | POA: Diagnosis not present

## 2017-05-15 MED ORDER — EMPAGLIFLOZIN 25 MG PO TABS: 25.0000 mg | ORAL_TABLET | Freq: Every day | ORAL | 5 refills | Status: AC

## 2017-05-15 MED ORDER — PANTOPRAZOLE SODIUM 40 MG PO TBEC: 40.0000 mg | DELAYED_RELEASE_TABLET | Freq: Every day | ORAL | 11 refills | Status: DC

## 2017-05-15 MED ORDER — METFORMIN HCL ER 500 MG PO TB24: 500.0000 mg | ORAL_TABLET | Freq: Every day | ORAL | 5 refills | Status: AC

## 2017-05-15 MED ORDER — ASPIRIN EC 81 MG PO TBEC: 162.0000 mg | DELAYED_RELEASE_TABLET | Freq: Every day | ORAL | 11 refills | Status: DC

## 2017-05-15 NOTE — Patient Instructions (Addendum)
Christopher Cummings, Thank you for coming in to clinic today.  1. For your moods: - Stay aware of your moods.  Call clinic if you find yourself down, depressed, hopeless, or not wanting to do things you enjoy.  2. Continue all followup with cardiologists and heart failure.  3. For your diabetes: - Continue metformin 500 mg once daily - START Jardiance 25 mg once daily.  Please schedule a follow-up appointment with Christopher Cummings, AGNP. Return for at your next scheduled appointment.   If you have any other questions or concerns, please feel free to call the clinic or send a message through MyChart. You may also schedule an earlier appointment if necessary.  You will receive a survey after today's visit either digitally by e-mail or paper by [employer] mail. Your experiences and feedback matter to us.  Please respond so we know how we are doing as we provide care for you.   Christopher McardleLauren Weda Baumgarner, DNP, AGNP-BC Adult Gerontology Nurse Practitioner [facility], [facility]

## 2017-05-15 NOTE — Progress Notes (Signed)
Subjective:    Patient ID: Christopher Elseharles Rawles Sr., male    DOB: [birth year], 66 y.o.   MRN: [id number]  Christopher ElseCharles Kubitz Sr. is a 66 y.o. male presenting on 05/15/2017 for Hospitalization Follow-up (elevated myocardial infarction, cardiac stent placed )  Christopher Cummings is accompanied today by his sister.  HPI Hospital f/u Cardiology Admission Date: 11/5 Discharge Date: 11/7 Diagnosis: Chest pain Follow up recommendations: Continue f/u w/ cardiology as scheduled. Interval history: Pt has had no chest pains since admission.  Has kept small activities going w/ very little exertion.  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  Notes is frequently bored.  Took short walk w/ family the other day while being pushed in a wheelchair.   - Will be starting cardiac rehab per Cardiology orders. Pt is looking forward to this.  Depression Screening: Pt admits he does have "momentary thoughts" he would be better off not here, but are fleeting and only last several minutes.  Pt denies SI, HI or any plans to carry out. Pt verbalizes that he feels like a burden to his sister. - He also notes he finds himself frequently forgetful, but that it may be related to increased stressors about health.    Diabetes Pt has had persistently elevated CBGs at home.  Notes we had discussed new medication at last visit and would like to start it today.  [facility] 05/21/2017 05/11/2017 04/30/2017  Decreased Interest 0 0 0  Down, Depressed, Hopeless 0 0 0  PHQ - 2 Score 0 0 0  Altered sleeping 1 - -  Tired, decreased energy 1 - -  Change in appetite 1 - -  Feeling bad or failure about yourself  0 - -  Trouble concentrating 0 - -  Moving slowly or fidgety/restless 2 - -  Suicidal thoughts 0 - -  PHQ-9 Score 5 - -  Difficult doing work/chores Somewhat difficult - -    Social History   Tobacco Use  . Smoking status: Never Smoker  . Smokeless tobacco: Never Used  Substance Use Topics  . Alcohol use: No   Comment: No Alcohol 12 years, but drank 2 "bottles of liquor" each week for several years prior to quitting  . Drug use: No    Review of Systems Per HPI unless specifically indicated above     Objective:    BP (!) 94/52 (BP Location: Left Arm, Patient Position: Sitting, Cuff Size: Normal)   Pulse 69   Temp 97.7 F (36.5 C) (Oral)   Resp 16   Ht 5' 7.5" (1.715 m)   Wt 228 lb (103.4 kg)   BMI 35.18 kg/m   Wt Readings from Last 3 Encounters:  05/21/17 224 lb (101.6 kg)  05/21/17 223 lb 6.4 oz (101.3 kg)  05/15/17 228 lb (103.4 kg)    Physical Exam  General - obese, well-appearing, NAD HEENT - Normocephalic, atraumatic Neck - supple, non-tender, no LAD, no thyromegaly, no carotid bruit Heart - RRR, no murmurs heard Lungs - Clear throughout all lobes, no wheezing, crackles, or rhonchi. Normal work of breathing. Extremeties - non-tender, no edema, cap refill < 2 seconds, peripheral pulses intact +2 bilaterally Skin - warm, dry Neuro - awake, alert, oriented x3, normal gait Psych - Depressed mood and affect, normal behavior.   Results for orders placed or performed during the hospital encounter of 05/07/17  Basic metabolic panel  Result Value Ref Range   Sodium 134 (L) 135 - 145 mmol/L   Potassium  3.9 3.5 - 5.1 mmol/L   Chloride 98 (L) 101 - 111 mmol/L   CO2 25 22 - 32 mmol/L   Glucose, Bld 319 (H) 65 - 99 mg/dL   BUN 18 6 - 20 mg/dL   Creatinine, Ser 1.610.84 0.61 - 1.24 mg/dL   Calcium 9.2 8.9 - 09.610.3 mg/dL   GFR calc non Af Amer >60 >60 mL/min   GFR calc Af Amer >60 >60 mL/min   Anion gap 11 5 - 15  CBC  Result Value Ref Range   WBC 9.3 3.8 - 10.6 K/uL   RBC 4.65 4.40 - 5.90 MIL/uL   Hemoglobin 12.8 (L) 13.0 - 18.0 g/dL   HCT 04.537.9 (L) 40.940.0 - 81.152.0 %   MCV 81.5 80.0 - 100.0 fL   MCH 27.5 26.0 - 34.0 pg   MCHC 33.7 32.0 - 36.0 g/dL   RDW 91.414.7 (H) 78.211.5 - 95.614.5 %   Platelets 228 150 - 440 K/uL  Troponin I  Result Value Ref Range   Troponin I 0.26 (HH) <0.03 ng/mL  APTT   Result Value Ref Range   aPTT 30 24 - 36 seconds  Protime-INR  Result Value Ref Range   Prothrombin Time 15.2 11.4 - 15.2 seconds   INR 1.21   Troponin I  Result Value Ref Range   Troponin I 0.26 (HH) <0.03 ng/mL  Troponin I  Result Value Ref Range   Troponin I 0.24 (HH) <0.03 ng/mL  Troponin I  Result Value Ref Range   Troponin I 0.21 (HH) <0.03 ng/mL  Basic metabolic panel  Result Value Ref Range   Sodium 134 (L) 135 - 145 mmol/L   Potassium 3.8 3.5 - 5.1 mmol/L   Chloride 99 (L) 101 - 111 mmol/L   CO2 26 22 - 32 mmol/L   Glucose, Bld 258 (H) 65 - 99 mg/dL   BUN 17 6 - 20 mg/dL   Creatinine, Ser 2.130.72 0.61 - 1.24 mg/dL   Calcium 9.0 8.9 - 08.610.3 mg/dL   GFR calc non Af Amer >60 >60 mL/min   GFR calc Af Amer >60 >60 mL/min   Anion gap 9 5 - 15  CBC  Result Value Ref Range   WBC 8.8 3.8 - 10.6 K/uL   RBC 4.73 4.40 - 5.90 MIL/uL   Hemoglobin 13.0 13.0 - 18.0 g/dL   HCT 57.838.5 (L) 46.940.0 - 62.952.0 %   MCV 81.5 80.0 - 100.0 fL   MCH 27.5 26.0 - 34.0 pg   MCHC 33.7 32.0 - 36.0 g/dL   RDW 52.814.9 (H) 41.311.5 - 24.414.5 %   Platelets 211 150 - 440 K/uL  Glucose, capillary  Result Value Ref Range   Glucose-Capillary 246 (H) 65 - 99 mg/dL  Heparin level (unfractionated)  Result Value Ref Range   Heparin Unfractionated 0.67 0.30 - 0.70 IU/mL  Glucose, capillary  Result Value Ref Range   Glucose-Capillary 243 (H) 65 - 99 mg/dL  Heparin level (unfractionated)  Result Value Ref Range   Heparin Unfractionated 0.60 0.30 - 0.70 IU/mL  Glucose, capillary  Result Value Ref Range   Glucose-Capillary 321 (H) 65 - 99 mg/dL   Comment 1 Notify RN    Comment 2 Document in Chart   Hemoglobin A1c  Result Value Ref Range   Hgb A1c MFr Bld 9.3 (H) 4.8 - 5.6 %   Mean Plasma Glucose 220.21 mg/dL  CBC  Result Value Ref Range   WBC 9.2 3.8 - 10.6 K/uL   RBC  4.44 4.40 - 5.90 MIL/uL   Hemoglobin 12.4 (L) 13.0 - 18.0 g/dL   HCT 29.5 (L) 62.1 - 30.8 %   MCV 80.6 80.0 - 100.0 fL   MCH 27.9 26.0 - 34.0  pg   MCHC 34.6 32.0 - 36.0 g/dL   RDW 65.7 84.6 - 96.2 %   Platelets 203 150 - 440 K/uL  Heparin level (unfractionated)  Result Value Ref Range   Heparin Unfractionated 0.63 0.30 - 0.70 IU/mL  Glucose, capillary  Result Value Ref Range   Glucose-Capillary 246 (H) 65 - 99 mg/dL  Glucose, capillary  Result Value Ref Range   Glucose-Capillary 235 (H) 65 - 99 mg/dL   Comment 1 Notify RN   Glucose, capillary  Result Value Ref Range   Glucose-Capillary 147 (H) 65 - 99 mg/dL  Digoxin level  Result Value Ref Range   Digoxin Level 0.3 (L) 0.8 - 2.0 ng/mL  Glucose, capillary  Result Value Ref Range   Glucose-Capillary 247 (H) 65 - 99 mg/dL  Basic metabolic panel  Result Value Ref Range   Sodium 138 135 - 145 mmol/L   Potassium 3.7 3.5 - 5.1 mmol/L   Chloride 102 101 - 111 mmol/L   CO2 28 22 - 32 mmol/L   Glucose, Bld 138 (H) 65 - 99 mg/dL   BUN 16 6 - 20 mg/dL   Creatinine, Ser 9.52 0.61 - 1.24 mg/dL   Calcium 9.0 8.9 - 84.1 mg/dL   GFR calc non Af Amer >60 >60 mL/min   GFR calc Af Amer >60 >60 mL/min   Anion gap 8 5 - 15  CBC  Result Value Ref Range   WBC 9.3 3.8 - 10.6 K/uL   RBC 4.53 4.40 - 5.90 MIL/uL   Hemoglobin 12.3 (L) 13.0 - 18.0 g/dL   HCT 32.4 (L) 40.1 - 02.7 %   MCV 82.0 80.0 - 100.0 fL   MCH 27.1 26.0 - 34.0 pg   MCHC 33.0 32.0 - 36.0 g/dL   RDW 25.3 (H) 66.4 - 40.3 %   Platelets 217 150 - 440 K/uL  Glucose, capillary  Result Value Ref Range   Glucose-Capillary 256 (H) 65 - 99 mg/dL   Comment 1 Notify RN   Glucose, capillary  Result Value Ref Range   Glucose-Capillary 134 (H) 65 - 99 mg/dL  Glucose, capillary  Result Value Ref Range   Glucose-Capillary 232 (H) 65 - 99 mg/dL  POCT Activated clotting time  Result Value Ref Range   Activated Clotting Time 252 seconds      Assessment & Plan:   Problem List Items Addressed This Visit      Cardiovascular and Mediastinum   CAD (coronary artery disease) (Chronic)    CAD w/ angina was cause for most  recent hospital admission.  Multiple coronary artery lesions w/ hx of PCI most recently in August 2018.  Goal for treatment of Christopher Cummings is to manage angina symptoms and prevent progression of ASCVD.  Defer primary management to cardiology, but will support prevention from PCP.  Plan: 1. Continue aspirin 81 mg - pt requests refill today. 2. Last labs wnl.  Continue regular monitoring of CMP and lipid panel. 3. Follow up as scheduled in about 2 months.      Relevant Medications   aspirin EC 81 MG tablet     Digestive   Gastroesophageal reflux disease    Currently well controlle.d  Pt reports needs to have refill of pantoprazole.  Refill provided.  Endocrine   Diabetes (HCC) - Primary    Uncontrolled w/ recent start of metforminER 500 mg once daily.  Pt w/ most recent A1c= 8.1%.  Pt w/ persistently elevated CBG.  Plan: 1. Reviewed low glycemic diet. 2. Encouraged pt to check daily cbg fasting am. 3. START Jardiance 25 mg once daily. - Continue metformin 500 mg once daily.  Not increasing 2/2 cardiac disease at this time. 4. Follow up as scheduled in about 2 months.      Relevant Medications   metFORMIN (GLUCOPHAGE-XR) 500 MG 24 hr tablet   aspirin EC 81 MG tablet   empagliflozin (JARDIANCE) 25 MG TABS tablet    Other Visit Diagnoses    Hospitalization within last 30 days       Pt w/ hospital discharge 6 days prior to office visit and > 2 visits to hospital in last 30 days & at high risk for readmit.  Encouraged pt to call if needed.      Meds ordered this encounter  Medications  . metFORMIN (GLUCOPHAGE-XR) 500 MG 24 hr tablet    Sig: Take 1 tablet (500 mg total) daily with breakfast by mouth.    Dispense:  30 tablet    Refill:  5    Fill when pt requests refill.  Do not fill today.    Order Specific Question:   Supervising Provider    Answer:   Smitty Cords [[id number]]  . pantoprazole (PROTONIX) 40 MG tablet    Sig: Take 1 tablet (40 mg total) daily by  mouth.    Dispense:  30 tablet    Refill:  11    Fill when pt requests refill.  Do not fill today.    Order Specific Question:   Supervising Provider    Answer:   Smitty Cords [[id number]]  . aspirin EC 81 MG tablet    Sig: Take 2 tablets (162 mg total) daily by mouth.    Dispense:  30 tablet    Refill:  11    Fill when pt requests refill.  Do not fill today.    Order Specific Question:   Supervising Provider    Answer:   Smitty Cords [[id number]]  . empagliflozin (JARDIANCE) 25 MG TABS tablet    Sig: Take 25 mg daily by mouth.    Dispense:  30 tablet    Refill:  5    Order Specific Question:   Supervising Provider    Answer:   Smitty Cords [[id number]]    Follow up plan: Return for at your next scheduled appointment in 2 months for diabetes.  [name], DNP, AGPCNP-BC Adult Gerontology Primary Care Nurse Practitioner [facility] 05/29/2017, 1:04 PM

## 2017-05-21 ENCOUNTER — Encounter: Payer: Self-pay | Admitting: *Deleted

## 2017-05-21 ENCOUNTER — Encounter: Payer: BLUE CROSS/BLUE SHIELD | Attending: Cardiovascular Disease | Admitting: *Deleted

## 2017-05-21 ENCOUNTER — Encounter: Payer: Self-pay | Admitting: Physician Assistant

## 2017-05-21 ENCOUNTER — Ambulatory Visit (INDEPENDENT_AMBULATORY_CARE_PROVIDER_SITE_OTHER): Payer: BLUE CROSS/BLUE SHIELD | Admitting: Physician Assistant

## 2017-05-21 VITALS — Ht 69.25 in | Wt 223.4 lb

## 2017-05-21 VITALS — BP 120/60 | HR 83 | Ht 69.5 in | Wt 224.0 lb

## 2017-05-21 DIAGNOSIS — E119 Type 2 diabetes mellitus without complications: Secondary | ICD-10-CM | POA: Diagnosis not present

## 2017-05-21 DIAGNOSIS — I252 Old myocardial infarction: Secondary | ICD-10-CM | POA: Insufficient documentation

## 2017-05-21 DIAGNOSIS — Z9861 Coronary angioplasty status: Secondary | ICD-10-CM

## 2017-05-21 DIAGNOSIS — I4819 Other persistent atrial fibrillation: Secondary | ICD-10-CM

## 2017-05-21 DIAGNOSIS — I481 Persistent atrial fibrillation: Secondary | ICD-10-CM

## 2017-05-21 DIAGNOSIS — Z955 Presence of coronary angioplasty implant and graft: Secondary | ICD-10-CM | POA: Diagnosis present

## 2017-05-21 DIAGNOSIS — Z7984 Long term (current) use of oral hypoglycemic drugs: Secondary | ICD-10-CM | POA: Diagnosis not present

## 2017-05-21 DIAGNOSIS — I2510 Atherosclerotic heart disease of native coronary artery without angina pectoris: Secondary | ICD-10-CM

## 2017-05-21 DIAGNOSIS — K219 Gastro-esophageal reflux disease without esophagitis: Secondary | ICD-10-CM | POA: Diagnosis not present

## 2017-05-21 DIAGNOSIS — E782 Mixed hyperlipidemia: Secondary | ICD-10-CM

## 2017-05-21 DIAGNOSIS — I5022 Chronic systolic (congestive) heart failure: Secondary | ICD-10-CM

## 2017-05-21 DIAGNOSIS — Z7982 Long term (current) use of aspirin: Secondary | ICD-10-CM | POA: Diagnosis not present

## 2017-05-21 DIAGNOSIS — Z8719 Personal history of other diseases of the digestive system: Secondary | ICD-10-CM | POA: Diagnosis not present

## 2017-05-21 DIAGNOSIS — I10 Essential (primary) hypertension: Secondary | ICD-10-CM | POA: Insufficient documentation

## 2017-05-21 DIAGNOSIS — E785 Hyperlipidemia, unspecified: Secondary | ICD-10-CM | POA: Diagnosis not present

## 2017-05-21 DIAGNOSIS — Z79899 Other long term (current) drug therapy: Secondary | ICD-10-CM | POA: Diagnosis not present

## 2017-05-21 DIAGNOSIS — Z952 Presence of prosthetic heart valve: Secondary | ICD-10-CM

## 2017-05-21 DIAGNOSIS — Z7902 Long term (current) use of antithrombotics/antiplatelets: Secondary | ICD-10-CM | POA: Insufficient documentation

## 2017-05-21 DIAGNOSIS — I214 Non-ST elevation (NSTEMI) myocardial infarction: Secondary | ICD-10-CM

## 2017-05-21 MED ORDER — SPIRONOLACTONE 25 MG PO TABS: 12.5000 mg | ORAL_TABLET | Freq: Every day | ORAL | 3 refills | Status: DC

## 2017-05-21 MED ORDER — PANTOPRAZOLE SODIUM 40 MG PO TBEC: 40.0000 mg | DELAYED_RELEASE_TABLET | Freq: Every day | ORAL | 3 refills | Status: AC

## 2017-05-21 MED ORDER — CLOPIDOGREL BISULFATE 75 MG PO TABS: 75.0000 mg | ORAL_TABLET | Freq: Every day | ORAL | 3 refills | Status: DC

## 2017-05-21 MED ORDER — LISINOPRIL 5 MG PO TABS: 5.0000 mg | ORAL_TABLET | Freq: Every day | ORAL | 3 refills | Status: DC

## 2017-05-21 NOTE — Progress Notes (Signed)
Cardiac Individual Treatment Plan  Patient Details  Name: Christopher Pettengill Sr. MRN: [id number] Date of Birth: 06/27/1951 Referring Provider:     Cardiac Rehab from 05/21/2017 in [facility] Cardiac and Pulmonary Rehab  Referring Provider  Kathlyn Sacramento MD      Initial Encounter Date:    Cardiac Rehab from 05/21/2017 in [facility] Cardiac and Pulmonary Rehab  Date  05/21/17  Referring Provider  Kathlyn Sacramento MD      Visit Diagnosis: NSTEMI (non-ST elevated myocardial infarction) ([place])  S/P PTCA (percutaneous transluminal coronary angioplasty)  Patient's Home Medications on Admission:  Current Outpatient Medications:  .  acetaminophen (TYLENOL) 325 MG tablet, Take 2 tablets (650 mg total) by mouth every 6 (six) hours as needed for mild pain (or Fever >/= 101)., Disp: , Rfl:  .  aspirin EC 81 MG tablet, Take 2 tablets (162 mg total) daily by mouth., Disp: 30 tablet, Rfl: 11 .  atorvastatin (LIPITOR) 80 MG tablet, Take 1 tablet (80 mg total) by mouth at bedtime., Disp: 90 tablet, Rfl: 3 .  blood glucose meter kit and supplies, Dispense based on patient and insurance preference. Use 1 time daily as directed. (FOR ICD-9 250.00, 250.01)., Disp: 1 each, Rfl: 0 .  Blood Pressure Monitoring (BLOOD PRESSURE CUFF) MISC, 1 Units by Does not apply route daily., Disp: 1 each, Rfl: 0 .  clopidogrel (PLAVIX) 75 MG tablet, Take 75 mg by mouth daily., Disp: , Rfl:  .  digoxin (LANOXIN) 0.125 MG tablet, Take 0.5 tablets (0.0625 mg total) by mouth daily., Disp: 30 tablet, Rfl: 0 .  empagliflozin (JARDIANCE) 25 MG TABS tablet, Take 25 mg daily by mouth., Disp: 30 tablet, Rfl: 5 .  lisinopril (PRINIVIL,ZESTRIL) 10 MG tablet, Take 10 mg by mouth at bedtime., Disp: , Rfl: 3 .  meclizine (ANTIVERT) 25 MG tablet, Take 25 mg by mouth 3 (three) times daily as needed for dizziness., Disp: , Rfl:  .  metFORMIN (GLUCOPHAGE-XR) 500 MG 24 hr tablet, Take 1 tablet (500 mg total) daily with breakfast by mouth., Disp: 30 tablet,  Rfl: 5 .  metoprolol succinate (TOPROL-XL) 100 MG 24 hr tablet, Take 1 tablet (100 mg total) daily by mouth. Take with or immediately following a meal., Disp: 30 tablet, Rfl: 2 .  nitroGLYCERIN (NITROSTAT) 0.4 MG SL tablet, Place 1 tablet (0.4 mg total) under the tongue every 5 (five) minutes as needed for chest pain., Disp: 30 tablet, Rfl: 2 .  pantoprazole (PROTONIX) 40 MG tablet, Take 1 tablet (40 mg total) daily by mouth., Disp: 30 tablet, Rfl: 11 .  ranolazine (RANEXA) 500 MG 12 hr tablet, Take 1 tablet (500 mg total) by mouth 2 (two) times daily., Disp: 60 tablet, Rfl: 0 .  isosorbide mononitrate (IMDUR) 30 MG 24 hr tablet, Take 0.5 tablets (15 mg total) by mouth daily. (Patient not taking: Reported on 05/21/2017), Disp: 30 tablet, Rfl: 0  Past Medical History: Past Medical History:  Diagnosis Date  . Acute systolic heart failure ([place])   . CAD (coronary artery disease)    10/18 ISR to the p/d LCX, and ostium of diag treated with cutting balloon. EF 35%  . Colitis   . Diabetes mellitus without complication ([place])   . Diverticulitis   . Hyperlipidemia   . Hypertension     Tobacco Use: Social History   Tobacco Use  Smoking Status Never Smoker  Smokeless Tobacco Never Used    Labs: Recent Review Scientist, physiological    Labs for ITP Cardiac and Pulmonary  Rehab Latest Ref Rng & Units 02/25/2017 03/31/2017 04/25/2017 05/07/2017   Cholestrol 0 - 200 mg/dL 89 108 80 -   LDLCALC 0 - 99 mg/dL 42 43 36 -   HDL >40 mg/dL 35(L) 37(L) 29(L) -   Trlycerides <150 mg/dL 60 138 77 -   Hemoglobin A1c 4.8 - 5.6 % - 8.1(H) - 9.3(H)       Exercise Target Goals: Date: 05/21/17  Exercise Program Goal: Individual exercise prescription set with THRR, safety & activity barriers. Participant demonstrates ability to understand and report RPE using BORG scale, to self-measure pulse accurately, and to acknowledge the importance of the exercise prescription.  Exercise Prescription Goal: Starting with  aerobic activity 30 plus minutes a day, 3 days per week for initial exercise prescription. Provide home exercise prescription and guidelines that participant acknowledges understanding prior to discharge.  Activity Barriers & Risk Stratification: Activity Barriers & Cardiac Risk Stratification - 05/21/17 1222      Activity Barriers & Cardiac Risk Stratification   Activity Barriers  Other (comment);Deconditioning;Muscular Weakness;Balance Concerns;Decreased Ventricular Function    Comments  Vertigo    Cardiac Risk Stratification  High       6 Minute Walk: 6 Minute Walk    Row Name 05/21/17 1221         6 Minute Walk   Phase  Initial     Distance  1165 feet     Walk Time  6 minutes     # of Rest Breaks  0     MPH  2.21     METS  2.51     RPE  7     VO2 Peak  8.8     Symptoms  No     Resting HR  80 bpm     Resting BP  132/70     Resting Oxygen Saturation   96 %     Exercise Oxygen Saturation  during 6 min walk  97 %     Max Ex. HR  115 bpm     Max Ex. BP  132/76     2 Minute Post BP  126/60        Oxygen Initial Assessment:   Oxygen Re-Evaluation:   Oxygen Discharge (Final Oxygen Re-Evaluation):   Initial Exercise Prescription: Initial Exercise Prescription - 05/21/17 1200      Date of Initial Exercise RX and Referring Provider   Date  05/21/17    Referring Provider  Kathlyn Sacramento MD      Recumbant Bike   Level  2    RPM  50    Watts  16    Minutes  15    METs  2.5      NuStep   Level  2    SPM  80    Minutes  15    METs  2.5      Track   Laps  32    Minutes  15    METs  2.45      Prescription Details   Frequency (times per week)  3    Duration  Progress to 45 minutes of aerobic exercise without signs/symptoms of physical distress      Intensity   THRR 40-80% of Max Heartrate  110-141    Ratings of Perceived Exertion  11-13    Perceived Dyspnea  0-4      Progression   Progression  Continue to progress workloads to maintain intensity  without signs/symptoms of physical distress.  Resistance Training   Training Prescription  Yes    Weight  3 lbs    Reps  10-15       Perform Capillary Blood Glucose checks as needed.  Exercise Prescription Changes:  Exercise Prescription Changes    Row Name 05/21/17 1200             Response to Exercise   Blood Pressure (Admit)  132/70       Blood Pressure (Exercise)  132/76       Blood Pressure (Exit)  126/60       Heart Rate (Admit)  80 bpm       Heart Rate (Exercise)  115 bpm       Heart Rate (Exit)  80 bpm       Oxygen Saturation (Admit)  96 %       Oxygen Saturation (Exercise)  97 %       Rating of Perceived Exertion (Exercise)  7       Symptoms  none       Comments  walk test results          Exercise Comments:   Exercise Goals and Review:  Exercise Goals    Row Name 05/21/17 1224             Exercise Goals   Increase Physical Activity  Yes       Intervention  Provide advice, education, support and counseling about physical activity/exercise needs.;Develop an individualized exercise prescription for aerobic and resistive training based on initial evaluation findings, risk stratification, comorbidities and participant's personal goals.       Expected Outcomes  Achievement of increased cardiorespiratory fitness and enhanced flexibility, muscular endurance and strength shown through measurements of functional capacity and personal statement of participant.       Increase Strength and Stamina  Yes       Intervention  Provide advice, education, support and counseling about physical activity/exercise needs.;Develop an individualized exercise prescription for aerobic and resistive training based on initial evaluation findings, risk stratification, comorbidities and participant's personal goals.       Expected Outcomes  Achievement of increased cardiorespiratory fitness and enhanced flexibility, muscular endurance and strength shown through measurements of  functional capacity and personal statement of participant.       Able to understand and use rate of perceived exertion (RPE) scale  Yes       Intervention  Provide education and explanation on how to use RPE scale       Expected Outcomes  Short Term: Able to use RPE daily in rehab to express subjective intensity level;Long Term:  Able to use RPE to guide intensity level when exercising independently       Knowledge and understanding of Target Heart Rate Range (THRR)  Yes       Intervention  Provide education and explanation of THRR including how the numbers were predicted and where they are located for reference       Expected Outcomes  Short Term: Able to state/look up THRR;Long Term: Able to use THRR to govern intensity when exercising independently;Short Term: Able to use daily as guideline for intensity in rehab       Able to check pulse independently  Yes       Intervention  Provide education and demonstration on how to check pulse in carotid and radial arteries.;Review the importance of being able to check your own pulse for safety during independent exercise  Expected Outcomes  Short Term: Able to explain why pulse checking is important during independent exercise;Long Term: Able to check pulse independently and accurately       Understanding of Exercise Prescription  Yes       Intervention  Provide education, explanation, and written materials on patient's individual exercise prescription       Expected Outcomes  Short Term: Able to explain program exercise prescription;Long Term: Able to explain home exercise prescription to exercise independently          Exercise Goals Re-Evaluation :   Discharge Exercise Prescription (Final Exercise Prescription Changes): Exercise Prescription Changes - 05/21/17 1200      Response to Exercise   Blood Pressure (Admit)  132/70    Blood Pressure (Exercise)  132/76    Blood Pressure (Exit)  126/60    Heart Rate (Admit)  80 bpm    Heart Rate  (Exercise)  115 bpm    Heart Rate (Exit)  80 bpm    Oxygen Saturation (Admit)  96 %    Oxygen Saturation (Exercise)  97 %    Rating of Perceived Exertion (Exercise)  7    Symptoms  none    Comments  walk test results       Nutrition:  Target Goals: Understanding of nutrition guidelines, daily intake of sodium <1535m, cholesterol <202m calories 30% from fat and 7% or less from saturated fats, daily to have 5 or more servings of fruits and vegetables.  Biometrics: Pre Biometrics - 05/21/17 1225      Pre Biometrics   Height  5' 9.25" (1.759 m)    Weight  223 lb 6.4 oz (101.3 kg)    Waist Circumference  43.5 inches    Hip Circumference  41 inches    Waist to Hip Ratio  1.06 %    BMI (Calculated)  32.75    Single Leg Stand  1.22 seconds        Nutrition Therapy Plan and Nutrition Goals: Nutrition Therapy & Goals - 05/21/17 1221      Nutrition Therapy   Drug/Food Interactions  Statins/Certain Fruits       Nutrition Discharge: Rate Your Plate Scores: Nutrition Assessments - 05/21/17 1221      MEDFICTS Scores   Pre Score  24       Nutrition Goals Re-Evaluation:   Nutrition Goals Discharge (Final Nutrition Goals Re-Evaluation):   Psychosocial: Target Goals: Acknowledge presence or absence of significant depression and/or stress, maximize coping skills, provide positive support system. Participant is able to verbalize types and ability to use techniques and skills needed for reducing stress and depression.   Initial Review & Psychosocial Screening: Initial Psych Review & Screening - 05/21/17 1224      Initial Review   Current issues with  Current Stress Concerns    Source of Stress Concerns  Chronic Illness;Transportation    Comments  [place] to [place]. He has recently moved in with his sister and brother in law who have a 2570ear old autistic son in a group home.  ChDaoays he misses his 1340ear old Grandchildren  that he took care of every other weekend for his son and daughter in law. ChDaryldmits that was his reason to quit drinking 13 years ago. He said he had his first heart attack 13 years ago about a month after he quit drinking a pint of liquor on Friday and a 5th or 2 on  Saturday night. Arney recently retired from working at [employer] for [employer] years and said he has been in and out of the hospital every since. Ann reports that he has never smoked. Amadeus said he used to go to a large mall outside of [place] and sometimes walk 10 miles but is frustrated since he has still had heart problems including his recenlty NSTEMI/PTCA.  Rilley said he doesn't eat like he should and isn't hungry much. He reports his blood sugars in the am are sometimes 250 but sometimes 120. Discussed maybe eating something since he takes long acting Metformin. The short acting Metformin is listed as an allergy since it causes diarrhea so much. Yasmin carries his NTG sl and knows to discard it once it is opened per his MD he said he discards it after 6 months. He has a new bottle ready to start next week. 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 Zael said he really doesn't want to drive at times since he has vertigo at times      [place]?  Yes      Barriers   Psychosocial barriers to participate in program  The patient should benefit from training in stress management and relaxation.      Screening Interventions   Interventions  Yes;Encouraged to exercise;To provide support and resources with identified psychosocial needs    Expected Outcomes  Short Term goal: Utilizing psychosocial counselor, staff and physician to assist with identification of specific Stressors or current issues interfering with healing process. Setting desired goal for each stressor or current issue identified.;Long Term Goal: Stressors or current issues are controlled or  eliminated.;Short Term goal: Identification and review with participant of any Quality of Life or Depression concerns found by scoring the questionnaire.;Long Term goal: The participant improves quality of Life and PHQ9 Scores as seen by post scores and/or verbalization of changes       Quality of Life Scores:  Quality of Life - 05/21/17 1225      Quality of Life Scores   Health/Function Pre  18.43 %    Socioeconomic Pre  25.64 %    Psych/Spiritual Pre  24 %    Family Pre  27 %    GLOBAL Pre  22.18 %       PHQ-9: Recent Review Flowsheet Data    Depression screen [facility] 2/9 05/21/2017 05/11/2017 04/30/2017   Decreased Interest 0 0 0   Down, Depressed, Hopeless 0 0 0   PHQ - 2 Score 0 0 0   Altered sleeping 1 - -   Tired, decreased energy 1 - -   Change in appetite 1 - -   Feeling bad or failure about yourself  0 - -   Trouble concentrating 0 - -   Moving slowly or fidgety/restless 2 - -   Suicidal thoughts 0 - -   PHQ-9 Score 5 - -   Difficult doing work/chores Somewhat difficult - -     Interpretation of Total Score  Total Score Depression Severity:  1-4 = Minimal depression, 5-9 = Mild depression, 10-14 = Moderate depression, 15-19 = Moderately severe depression, 20-27 = Severe depression   Psychosocial Evaluation and Intervention:   Psychosocial Re-Evaluation:   Psychosocial Discharge (Final Psychosocial Re-Evaluation):   Vocational Rehabilitation: Provide vocational rehab assistance to qualifying candidates.   Vocational Rehab Evaluation & Intervention: Vocational Rehab - 05/21/17 1206  Initial Vocational Rehab Evaluation & Intervention   Assessment shows need for Vocational Rehabilitation  No       Education: Education Goals: Education classes will be provided on a variety of topics geared toward better understanding of heart health and risk factor modification. Participant will state understanding/return demonstration of topics presented as noted by  education test scores.  Learning Barriers/Preferences: Learning Barriers/Preferences - 05/21/17 1204      Learning Barriers/Preferences   Learning Barriers  Hearing    Learning Preferences  Group Instruction       Education Topics: General Nutrition Guidelines/Fats and Fiber: -Group instruction provided by verbal, written material, models and posters to present the general guidelines for heart healthy nutrition. Gives an explanation and review of dietary fats and fiber.   Controlling Sodium/Reading Food Labels: -Group verbal and written material supporting the discussion of sodium use in heart healthy nutrition. Review and explanation with models, verbal and written materials for utilization of the food label.   Exercise Physiology & Risk Factors: - Group verbal and written instruction with models to review the exercise physiology of the cardiovascular system and associated critical values. Details cardiovascular disease risk factors and the goals associated with each risk factor.   Aerobic Exercise & Resistance Training: - Gives group verbal and written discussion on the health impact of inactivity. On the components of aerobic and resistive training programs and the benefits of this training and how to safely progress through these programs.   Flexibility, Balance, General Exercise Guidelines: - Provides group verbal and written instruction on the benefits of flexibility and balance training programs. Provides general exercise guidelines with specific guidelines to those with heart or lung disease. Demonstration and skill practice provided.   Stress Management: - Provides group verbal and written instruction about the health risks of elevated stress, cause of high stress, and healthy ways to reduce stress.   Depression: - Provides group verbal and written instruction on the correlation between heart/lung disease and depressed mood, treatment options, and the stigmas associated  with seeking treatment.   Anatomy & Physiology of the Heart: - Group verbal and written instruction and models provide basic cardiac anatomy and physiology, with the coronary electrical and arterial systems. Review of: AMI, Angina, Valve disease, Heart Failure, Cardiac Arrhythmia, Pacemakers, and the ICD.   Cardiac Procedures: - Group verbal and written instruction to review commonly prescribed medications for heart disease. Reviews the medication, class of the drug, and side effects. Includes the steps to properly store meds and maintain the prescription regimen. (beta blockers and nitrates)   Cardiac Medications I: - Group verbal and written instruction to review commonly prescribed medications for heart disease. Reviews the medication, class of the drug, and side effects. Includes the steps to properly store meds and maintain the prescription regimen.   Cardiac Medications II: -Group verbal and written instruction to review commonly prescribed medications for heart disease. Reviews the medication, class of the drug, and side effects. (all other drug classes)    Go Sex-Intimacy & Heart Disease, Get SMART - Goal Setting: - Group verbal and written instruction through game format to discuss heart disease and the return to sexual intimacy. Provides group verbal and written material to discuss and apply goal setting through the application of the S.M.A.R.T. Method.   Other Matters of the Heart: - Provides group verbal, written materials and models to describe Heart Failure, Angina, Valve Disease, Peripheral Artery Disease, and Diabetes in the realm of heart disease. Includes description of the disease  process and treatment options available to the cardiac patient.   Exercise & Equipment Safety: - Individual verbal instruction and demonstration of equipment use and safety with use of the equipment.   Cardiac Rehab from 05/21/2017 in [facility] Cardiac and Pulmonary Rehab  Date  05/21/17   Educator  C.EnterkinRN  Instruction Review Code  3- Needs Reinforcement      Infection Prevention: - Provides verbal and written material to individual with discussion of infection control including proper hand washing and proper equipment cleaning during exercise session.   Cardiac Rehab from 05/21/2017 in [facility] Cardiac and Pulmonary Rehab  Date  05/21/17  Educator  Loletha Grayer ENterkinRN  Instruction Review Code  1- Verbalizes Understanding      Falls Prevention: - Provides verbal and written material to individual with discussion of falls prevention and safety.   Cardiac Rehab from 05/21/2017 in [facility] Cardiac and Pulmonary Rehab  Date  05/21/17  Educator  C. ENterkinRN  Instruction Review Code  1- Verbalizes Understanding      Diabetes: - Individual verbal and written instruction to review signs/symptoms of diabetes, desired ranges of glucose level fasting, after meals and with exercise. Acknowledge that pre and post exercise glucose checks will be done for 3 sessions at entry of program.   Cardiac Rehab from 05/21/2017 in [facility] Cardiac and Pulmonary Rehab  Date  05/21/17  Educator  C. Wildwood  Instruction Review Code  3- Needs Reinforcement      Other: -Provides group and verbal instruction on various topics (see comments)    Knowledge Questionnaire Score: Knowledge Questionnaire Score - 05/21/17 1204      Knowledge Questionnaire Score   Pre Score  14/28       Core Components/Risk Factors/Patient Goals at Admission: Personal Goals and Risk Factors at Admission - 05/21/17 1224      Core Components/Risk Factors/Patient Goals on Admission    Weight Management  Yes;Obesity;Weight Loss    Intervention  Weight Management: Develop a combined nutrition and exercise program designed to reach desired caloric intake, while maintaining appropriate intake of nutrient and fiber, sodium and fats, and appropriate energy expenditure required for the weight goal.;Weight Management:  Provide education and appropriate resources to help participant work on and attain dietary goals.;Weight Management/Obesity: Establish reasonable short term and long term weight goals.    Admit Weight  223 lb 6.4 oz (101.3 kg)    Goal Weight: Short Term  218 lb (98.9 kg)    Goal Weight: Long Term  213 lb (96.6 kg)    Expected Outcomes  Short Term: Continue to assess and modify interventions until short term weight is achieved;Long Term: Adherence to nutrition and physical activity/exercise program aimed toward attainment of established weight goal;Weight Loss: Understanding of general recommendations for a balanced deficit meal plan, which promotes 1-2 lb weight loss per week and includes a negative energy balance of [phone] kcal/d;Understanding recommendations for meals to include 15-35% energy as protein, 25-35% energy from fat, 35-60% energy from carbohydrates, less than 261m of dietary cholesterol, 20-35 gm of total fiber daily;Understanding of distribution of calorie intake throughout the day with the consumption of 4-5 meals/snacks    Diabetes  Yes    Intervention  Provide education about signs/symptoms and action to take for hypo/hyperglycemia.;Provide education about proper nutrition, including hydration, and aerobic/resistive exercise prescription along with prescribed medications to achieve blood glucose in normal ranges: Fasting glucose 65-99 mg/dL    Expected Outcomes  Short Term: Participant verbalizes understanding of the signs/symptoms and immediate care  of hyper/hypoglycemia, proper foot care and importance of medication, aerobic/resistive exercise and nutrition plan for blood glucose control.;Long Term: Attainment of HbA1C < 7%.    Heart Failure  Yes    Intervention  Provide a combined exercise and nutrition program that is supplemented with education, support and counseling about heart failure. Directed toward relieving symptoms such as shortness of breath, decreased exercise tolerance,  and extremity edema.    Expected Outcomes  Improve functional capacity of life;Short term: Attendance in program 2-3 days a week with increased exercise capacity. Reported lower sodium intake. Reported increased fruit and vegetable intake. Reports medication compliance.;Short term: Daily weights obtained and reported for increase. Utilizing diuretic protocols set by physician.;Long term: Adoption of self-care skills and reduction of barriers for early signs and symptoms recognition and intervention leading to self-care maintenance.    Hypertension  Yes    Intervention  Provide education on lifestyle modifcations including regular physical activity/exercise, weight management, moderate sodium restriction and increased consumption of fresh fruit, vegetables, and low fat dairy, alcohol moderation, and smoking cessation.;Monitor prescription use compliance.    Expected Outcomes  Short Term: Continued assessment and intervention until BP is < 140/49m HG in hypertensive participants. < 130/815mHG in hypertensive participants with diabetes, heart failure or chronic kidney disease.;Long Term: Maintenance of blood pressure at goal levels.    Lipids  Yes    Intervention  Provide education and support for participant on nutrition & aerobic/resistive exercise along with prescribed medications to achieve LDL <7039mHDL >33m97m  Expected Outcomes  Short Term: Participant states understanding of desired cholesterol values and is compliant with medications prescribed. Participant is following exercise prescription and nutrition guidelines.;Long Term: Cholesterol controlled with medications as prescribed, with individualized exercise RX and with personalized nutrition plan. Value goals: LDL < 70mg31mL > 40 mg.    Stress  Yes    Intervention  Offer individual and/or small group education and counseling on adjustment to heart disease, stress management and health-related lifestyle change. Teach and support self-help  strategies.;Refer participants experiencing significant psychosocial distress to appropriate mental health specialists for further evaluation and treatment. When possible, include family members and significant others in education/counseling sessions.    Expected Outcomes  Short Term: Participant demonstrates changes in health-related behavior, relaxation and other stress management skills, ability to obtain effective social support, and compliance with psychotropic medications if prescribed.;Long Term: Emotional wellbeing is indicated by absence of clinically significant psychosocial distress or social isolation.       Core Components/Risk Factors/Patient Goals Review:    Core Components/Risk Factors/Patient Goals at Discharge (Final Review):    ITP Comments: ITP Comments    Row Name 05/21/17 1212           ITP Comments  [place]. He has recently moved in with his sister and brother in law who have a 25 ye38 old autistic son in a group home.  CharlAndros he misses his 13 ye16 old Grandchildren that he took care of every other weekend for his son and daughter in law. CharlLornts that was his reason to quit drinking 13 years ago. He said he had his first heart attack 13 years ago about a month after he quit drinking a pint of liquor on Friday and a 5th or 2 on Saturday night. CharlEuellntly retired from working at a [employer] and said he has been in and out of the hospital  every since. Shivam reports that he has never smoked. Adithya said he used to go to a large mall outside of [place] and sometimes walk 10 miles but is frustrated since he has still had heart problems including his recenlty NSTEMI/PTCA.  Joneric said he doesn't eat like he should and isn't hungry much. He reports his blood sugars in the am are sometimes 250 but sometimes 120. Discussed maybe eating something since he takes long acting  Metformin. The short acting Metformin is listed as an allergy since it causes diarrhea so much. Duron carries his NTG sl and knows to discard it once it is opened per his MD he said he discards it after 6 months. He has a new bottle ready to start next week. 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 Janos said he really doesn't want to drive at times since he has vertigo at times.           Comments: Initial ITP

## 2017-05-21 NOTE — Progress Notes (Signed)
Daily Session Note  Patient Details  Name: Christopher Cummings. MRN: [id number] Date of Birth: March 18, 1951 Referring Provider:     Cardiac Rehab from 05/21/2017 in [facility]  Referring Provider  Kathlyn Sacramento MD      Encounter Date: 05/21/2017  Check In: Session Check In - 05/21/17 1206      Check-In   Location  [facility]-Cardiac & Pulmonary Rehab    Staff Present  Gerlene Burdock, RN, Levie Heritage, MA, ACSM RCEP, Exercise Physiologist    Supervising physician immediately available to respond to emergencies  See telemetry face sheet for immediately available ER MD    Medication changes reported      No    Fall or balance concerns reported     No    Tobacco Cessation  No Change    Warm-up and Cool-down  Performed as group-led instruction    Resistance Training Performed  Yes    VAD Patient?  No      Pain Assessment   Currently in Pain?  No/denies        Exercise Prescription Changes - 05/21/17 1200      Response to Exercise   Blood Pressure (Admit)  132/70    Blood Pressure (Exercise)  132/76    Blood Pressure (Exit)  126/60    Heart Rate (Admit)  80 bpm    Heart Rate (Exercise)  115 bpm    Heart Rate (Exit)  80 bpm    Oxygen Saturation (Admit)  96 %    Oxygen Saturation (Exercise)  97 %    Rating of Perceived Exertion (Exercise)  7    Symptoms  none    Comments  walk test results       Social History   Tobacco Use  Smoking Status Never Smoker  Smokeless Tobacco Never Used    Goals Met:  Proper associated with RPD/PD & O2 Sat Exercise tolerated well Personal goals reviewed No report of cardiac concerns or symptoms Strength training completed today  Goals Unmet:  Not Applicable  Comments:     Dr. Emily Filbert is Medical Director for [facility].

## 2017-05-21 NOTE — Patient Instructions (Signed)
Patient Instructions  Patient Details  Name: Christopher ElseCharles Pucillo Sr. MRN: [id number] Date of Birth: April 12, 1951 Referring Provider:  Iran OuchArida, Muhammad A, MD  Below are the personal goals you chose as well as exercise and nutrition goals. Our goal is to help you keep on track towards obtaining and maintaining your goals. We will be discussing your progress on these goals with you throughout the program.  Initial Exercise Prescription: Initial Exercise Prescription - 05/21/17 1200      Date of Initial Exercise RX and Referring Provider   Date  05/21/17    Referring Provider  Lorine BearsArida, Muhammad MD      Recumbant Bike   Level  2    RPM  50    Watts  16    Minutes  15    METs  2.5      NuStep   Level  2    SPM  80    Minutes  15    METs  2.5      Track   Laps  32    Minutes  15    METs  2.45      Prescription Details   Frequency (times per week)  3    Duration  Progress to 45 minutes of aerobic exercise without signs/symptoms of physical distress      Intensity   THRR 40-80% of Max Heartrate  110-141    Ratings of Perceived Exertion  11-13    Perceived Dyspnea  0-4      Progression   Progression  Continue to progress workloads to maintain intensity without signs/symptoms of physical distress.      Resistance Training   Training Prescription  Yes    Weight  3 lbs    Reps  10-15       Exercise Goals: Frequency: Be able to perform aerobic exercise three times per week working toward 3-5 days per week.  Intensity: Work with a perceived exertion of 11 (fairly light) - 15 (hard) as tolerated. Follow your new exercise prescription and watch for changes in prescription as you progress with the program. Changes will be reviewed with you when they are made.  Duration: You should be able to do 30 minutes of continuous aerobic exercise in addition to a 5 minute warm-up and a 5 minute cool-down routine.  Nutrition Goals: Your personal nutrition goals will be established when you do  your nutrition analysis with the dietician.  The following are nutrition guidelines to follow: Cholesterol < 200mg /day Sodium < 1500mg /day Fiber: Men over 50 yrs - 30 grams per day  Personal Goals: Personal Goals and Risk Factors at Admission - 05/21/17 1224      Core Components/Risk Factors/Patient Goals on Admission    Weight Management  Yes;Obesity;Weight Loss    Intervention  Weight Management: Develop a combined nutrition and exercise program designed to reach desired caloric intake, while maintaining appropriate intake of nutrient and fiber, sodium and fats, and appropriate energy expenditure required for the weight goal.;Weight Management: Provide education and appropriate resources to help participant work on and attain dietary goals.;Weight Management/Obesity: Establish reasonable short term and long term weight goals.    Admit Weight  223 lb 6.4 oz (101.3 kg)    Goal Weight: Short Term  218 lb (98.9 kg)    Goal Weight: Long Term  213 lb (96.6 kg)    Expected Outcomes  Short Term: Continue to assess and modify interventions until short term weight is achieved;Long Term: Adherence to nutrition and  physical activity/exercise program aimed toward attainment of established weight goal;Weight Loss: Understanding of general recommendations for a balanced deficit meal plan, which promotes 1-2 lb weight loss per week and includes a negative energy balance of [phone] kcal/d;Understanding recommendations for meals to include 15-35% energy as protein, 25-35% energy from fat, 35-60% energy from carbohydrates, less than 200mg  of dietary cholesterol, 20-35 gm of total fiber daily;Understanding of distribution of calorie intake throughout the day with the consumption of 4-5 meals/snacks    Diabetes  Yes    Intervention  Provide education about signs/symptoms and action to take for hypo/hyperglycemia.;Provide education about proper nutrition, including hydration, and aerobic/resistive exercise  prescription along with prescribed medications to achieve blood glucose in normal ranges: Fasting glucose 65-99 mg/dL    Expected Outcomes  Short Term: Participant verbalizes understanding of the signs/symptoms and immediate care of hyper/hypoglycemia, proper foot care and importance of medication, aerobic/resistive exercise and nutrition plan for blood glucose control.;Long Term: Attainment of HbA1C < 7%.    Heart Failure  Yes    Intervention  Provide a combined exercise and nutrition program that is supplemented with education, support and counseling about heart failure. Directed toward relieving symptoms such as shortness of breath, decreased exercise tolerance, and extremity edema.    Expected Outcomes  Improve functional capacity of life;Short term: Attendance in program 2-3 days a week with increased exercise capacity. Reported lower sodium intake. Reported increased fruit and vegetable intake. Reports medication compliance.;Short term: Daily weights obtained and reported for increase. Utilizing diuretic protocols set by physician.;Long term: Adoption of self-care skills and reduction of barriers for early signs and symptoms recognition and intervention leading to self-care maintenance.    Hypertension  Yes    Intervention  Provide education on lifestyle modifcations including regular physical activity/exercise, weight management, moderate sodium restriction and increased consumption of fresh fruit, vegetables, and low fat dairy, alcohol moderation, and smoking cessation.;Monitor prescription use compliance.    Expected Outcomes  Short Term: Continued assessment and intervention until BP is < 140/7990mm HG in hypertensive participants. < 130/5880mm HG in hypertensive participants with diabetes, heart failure or chronic kidney disease.;Long Term: Maintenance of blood pressure at goal levels.    Lipids  Yes    Intervention  Provide education and support for participant on nutrition & aerobic/resistive  exercise along with prescribed medications to achieve LDL 70mg , HDL >40mg .    Expected Outcomes  Short Term: Participant states understanding of desired cholesterol values and is compliant with medications prescribed. Participant is following exercise prescription and nutrition guidelines.;Long Term: Cholesterol controlled with medications as prescribed, with individualized exercise RX and with personalized nutrition plan. Value goals: LDL < 70mg , HDL > 40 mg.    Stress  Yes    Intervention  Offer individual and/or small group education and counseling on adjustment to heart disease, stress management and health-related lifestyle change. Teach and support self-help strategies.;Refer participants experiencing significant psychosocial distress to appropriate mental health specialists for further evaluation and treatment. When possible, include family members and significant others in education/counseling sessions.    Expected Outcomes  Short Term: Participant demonstrates changes in health-related behavior, relaxation and other stress management skills, ability to obtain effective social support, and compliance with psychotropic medications if prescribed.;Long Term: Emotional wellbeing is indicated by absence of clinically significant psychosocial distress or social isolation.       Tobacco Use Initial Evaluation: Social History   Tobacco Use  Smoking Status Never Smoker  Smokeless Tobacco Never Used    Exercise Goals and  Review: Exercise Goals    Row Name 05/21/17 1224             Exercise Goals   Increase Physical Activity  Yes       Intervention  Provide advice, education, support and counseling about physical activity/exercise needs.;Develop an individualized exercise prescription for aerobic and resistive training based on initial evaluation findings, risk stratification, comorbidities and participant's personal goals.       Expected Outcomes  Achievement of increased cardiorespiratory  fitness and enhanced flexibility, muscular endurance and strength shown through measurements of functional capacity and personal statement of participant.       Increase Strength and Stamina  Yes       Intervention  Provide advice, education, support and counseling about physical activity/exercise needs.;Develop an individualized exercise prescription for aerobic and resistive training based on initial evaluation findings, risk stratification, comorbidities and participant's personal goals.       Expected Outcomes  Achievement of increased cardiorespiratory fitness and enhanced flexibility, muscular endurance and strength shown through measurements of functional capacity and personal statement of participant.       Able to understand and use rate of perceived exertion (RPE) scale  Yes       Intervention  Provide education and explanation on how to use RPE scale       Expected Outcomes  Short Term: Able to use RPE daily in rehab to express subjective intensity level;Long Term:  Able to use RPE to guide intensity level when exercising independently       Knowledge and understanding of Target Heart Rate Range (THRR)  Yes       Intervention  Provide education and explanation of THRR including how the numbers were predicted and where they are located for reference       Expected Outcomes  Short Term: Able to state/look up THRR;Long Term: Able to use THRR to govern intensity when exercising independently;Short Term: Able to use daily as guideline for intensity in rehab       Able to check pulse independently  Yes       Intervention  Provide education and demonstration on how to check pulse in carotid and radial arteries.;Review the importance of being able to check your own pulse for safety during independent exercise       Expected Outcomes  Short Term: Able to explain why pulse checking is important during independent exercise;Long Term: Able to check pulse independently and accurately       Understanding  of Exercise Prescription  Yes       Intervention  Provide education, explanation, and written materials on patient's individual exercise prescription       Expected Outcomes  Short Term: Able to explain program exercise prescription;Long Term: Able to explain home exercise prescription to exercise independently          Copy of goals given to participant.

## 2017-05-21 NOTE — Patient Instructions (Signed)
Medication Instructions:  Your physician has recommended you make the following change in your medication:  1- Spironolactone 12.5 mg (1/2 tablet) by mouth once a day.   Labwork: Your physician recommends that you return for lab work in: TODAY (CBC, BMP, DIGOXIN).   Testing/Procedures: none  Follow-Up: Your physician recommends that you schedule a follow-up appointment in: AS SCHEDULED WITH DR ARIDA.   If you need a refill on your cardiac medications before your next appointment, please call your pharmacy.

## 2017-05-21 NOTE — Progress Notes (Signed)
Cardiology Office Note Date:  05/21/2017  Patient ID:  Christopher Tamburrino Sr., DOB [birth year], MRN [id number] PCP:  [name], NP  Cardiologist:  Dr. Fletcher Anon, MD   Chief Complaint: Hospital follow up  History of Present Illness: Christopher Ivory Sr. is a 66 y.o. male with history of CAD s/p CABG with LIMA-LAD, LCx stenting, and bioprosthetic aortic valve replacement in [place], [place], also with history of ICM/chronic systolic CHF, persistent Afib not on full dose anticoagulation due to recurrent and recent GI bleed, hypertension, hyperlipidemia, and diabetes who presents for hospital follow up.   Patient had a NSTEMI in August, 2018 requiring hospitalization at [facility]. He was found to have ISR of the LCx, as well as a diagonal. The diagonal was a long segment of stents, not suitable for angioplasty, and was left to be treated weekly. He underwent PTCA for ISR in the proximal and distal LCx. Echo in 01/2017 was technically difficult. He subsequently had a lower GI bleed in September, 2018 though dual antiplatelet therapy was not interrupted. He was admitted to [facility] in late September, 2018 with unstable angina. He underwent cardiac catheterization which showed restenosis in the LCx. He underwent repeat cutting balloon angioplasty to the LCx and diagonal. The LCx results were optimal but the diagonal angioplasty was suboptimal with 50% residual stenosis. Echo at that time showed an EF of 35-40% with normal functioning bioprosthetic valve, mild mitral regurgitation, and no pulmonary hypertension. He was seen in our office in mid October, 2018 and referred to cardiac rehabilitation at that time. It was felt the diagonal artery was high risk for occlusion and probably not worth attempting angioplasty in the future. It was felt the LCx could be treated with a drug-eluting stent if he tolerated long-term dual antiplatelet therapy. He was noted to be in A. fib with RVR at that time and his  metoprolol was increased along with discontinuation of isosorbide in an effort to avoid hypotension. It was preferred to avoid anticoagulation at that time given his recent GI bleed and need for dual antiplatelet therapy as above. He was admitted in late October, 2018 for unstable angina in the setting of Afib/flutter with RVR. He underwent Lexiscan Myoview that showed small in size, moderate in severity, reversible defect involving the apical anterior and apical lateral segments c/w ischemia. There was also a small in size, mild in severity, reversible defect involving the mid anterolateral segment c/w ischemia. There was a small in size, severe, fixed defect involving the apical inferior and apical segments c/w scar. EF ~ 46%. EKG demonstrated subtle inferior st segment elevation and Afib with RVR immediately following administration of Lexiscan. This was an abnormal study, potentially high risk. Given his comorbidities he was medically managed. It was felt, given his underlying CAD, when he developed tachycardic heart rates he had angina. Better rate control along with addition of Imdur were done.   He was readmitted in early November, 2018 with a NSTEMI with a peak troponin of 0.26. Heart rates were well controlled. He underwent repeat LHC on 05/08/2017 that showed significant two-vessel CAD, including diffusely diseased LAD that was similar to prior studies, along with significant ISR involving D1 (99%) and the proximal LCx (705%). The distal LCx demonstrated ISR up to 30%. RCA demonstrated mild disease that was similar to prior studies. He underwent successful PCI/DES to the proximal LCx with a Resolute Onyx drug-eluting stent (2.25 x 22 mm) with 0% residual stenosis and TIMI-3 flow. Discharge renal function and  cbc were stable. He saw the [facility] on 11/9, weight was 228 pounds (discharge weight on 11/7 of 226 pounds), no changes were made.  He comes in doing well today. No further chest pain.  No SOB. Tolerating all medications without issues. Has not missed any doses of DAPT. Weighing daily with weights ranging between 219-222 pounds on home scale (219 at home this morning). Had one day of polydipsia which was followed by weight gain from 219-->222 pounds the following day. He limited his fluids that day which led to a drop in weight back down to 219 pounds. No lower extremity swelling, abdominal fullness, early satiety, orthopnea, cough, or PND. Drinking less than 2 L of fluids daily. Has significantly cut back on salt usage, though does indicate, "there are just some thing that have to have salt." Started with cardiac rehab today, tolerated without issues. Wants to begin a walking regimen at home starting 11/20. Has not had any melena, BRBPR, hematuria, hemoptysis, hematochezia, or epistaxis. Reports feeling better than he has "in months."    Past Medical History:  Diagnosis Date  . CAD (coronary artery disease)    a. s/p CABG (LIMA-LAD), LCx stenting s/p PTCA ISR LCx 01/2017 s/p cutting balloon angioplasty to LCx and Diag 04/2017 s/p PCI/DES to LCx 11/18, Diag not felt to be amenable to further interventions  . Chronic systolic CHF (congestive heart failure) ([place])    a. TTE 10/18: EF of 35-40% with normal functioning bioprosthetic valve, mild mitral regurgitation, and no pulmonary hypertension  . Colitis   . Diabetes mellitus with complication ([place])   . Diverticulitis   . Essential hypertension   . H/O aortic valve replacement    a. bioprosthetic arotic valve in [place]  . Hyperlipidemia     Past Surgical History:  Procedure Laterality Date  . AORTIC VALVE REPLACEMENT    . CORONARY ARTERY BYPASS GRAFT    . CORONARY BALLOON ANGIOPLASTY N/A 04/02/2017   Performed by Troy Sine, MD at [facility]  . CORONARY STENT INTERVENTION N/A 05/08/2017   Performed by Nelva Bush, MD at [facility]  . CORONARY STENT INTERVENTION N/A 02/26/2017   Performed by Wellington Hampshire, MD at [facility]  . CORONARY/GRAFT ANGIOGRAPHY N/A 05/08/2017   Performed by Nelva Bush, MD at [facility]  . CORONARY/GRAFT ANGIOGRAPHY N/A 04/02/2017   Performed by Troy Sine, MD at [facility]  . CORONARY/GRAFT ANGIOGRAPHY N/A 02/26/2017   Performed by Wellington Hampshire, MD at [facility]  . JOINT REPLACEMENT Left    KNEE  . KNEE ARTHROPLASTY  2000    Current Meds  Medication Sig  . acetaminophen (TYLENOL) 325 MG tablet Take 2 tablets (650 mg total) by mouth every 6 (six) hours as needed for mild pain (or Fever >/= 101).  Marland Kitchen aspirin EC 81 MG tablet Take 2 tablets (162 mg total) daily by mouth.  Marland Kitchen atorvastatin (LIPITOR) 80 MG tablet Take 1 tablet (80 mg total) by mouth at bedtime.  . blood glucose meter kit and supplies Dispense based on patient and insurance preference. Use 1 time daily as directed. (FOR ICD-9 250.00, 250.01).  . Blood Pressure Monitoring (BLOOD PRESSURE CUFF) MISC 1 Units by Does not apply route daily.  . clopidogrel (PLAVIX) 75 MG tablet Take 1 tablet (75 mg total) daily by mouth.  . digoxin (LANOXIN) 0.125 MG tablet Take 0.5 tablets (0.0625 mg total) by mouth  daily.  . empagliflozin (JARDIANCE) 25 MG TABS tablet Take 25 mg daily by mouth.  . isosorbide mononitrate (IMDUR) 30 MG 24 hr tablet Take 0.5 tablets (15 mg total) by mouth daily.  Marland Kitchen lisinopril (PRINIVIL,ZESTRIL) 5 MG tablet Take 1 tablet (5 mg total) at bedtime by mouth.  . meclizine (ANTIVERT) 25 MG tablet Take 25 mg by mouth 3 (three) times daily as needed for dizziness.  . metFORMIN (GLUCOPHAGE-XR) 500 MG 24 hr tablet Take 1 tablet (500 mg total) daily with breakfast by mouth.  . metoprolol succinate (TOPROL-XL) 100 MG 24 hr tablet Take 1 tablet (100 mg total) daily by mouth. Take with or immediately following a meal.  . nitroGLYCERIN (NITROSTAT) 0.4 MG SL tablet Place 1 tablet (0.4 mg total) under the tongue every 5 (five) minutes as needed for chest  pain.  . pantoprazole (PROTONIX) 40 MG tablet Take 1 tablet (40 mg total) daily by mouth.  . ranolazine (RANEXA) 500 MG 12 hr tablet Take 1 tablet (500 mg total) by mouth 2 (two) times daily.  . [DISCONTINUED] clopidogrel (PLAVIX) 75 MG tablet Take 75 mg by mouth daily.  . [DISCONTINUED] lisinopril (PRINIVIL,ZESTRIL) 10 MG tablet Take 5 mg at bedtime by mouth.   . [DISCONTINUED] pantoprazole (PROTONIX) 40 MG tablet Take 1 tablet (40 mg total) daily by mouth.    Allergies:   Metformin and related   Social History:  The patient  reports that  has never smoked. he has never used smokeless tobacco. He reports that he does not drink alcohol or use drugs.   Family History:  The patient's family history includes CAD in his brother and mother; Diabetes in his sister; Healthy in his brother, paternal grandmother, and sister; Heart attack in his brother; Heart failure in his mother; Lupus in his mother; Prostate cancer in his father and paternal grandfather.  ROS:   Review of Systems  Constitutional: Negative for chills, diaphoresis, fever, malaise/fatigue and weight loss.  HENT: Negative for congestion.   Eyes: Negative for discharge and redness.  Respiratory: Negative for cough, hemoptysis, sputum production, shortness of breath and wheezing.   Cardiovascular: Negative for chest pain, palpitations, orthopnea, claudication, leg swelling and PND.  Gastrointestinal: Negative for abdominal pain, blood in stool, heartburn, melena, nausea and vomiting.  Genitourinary: Negative for hematuria.  Musculoskeletal: Negative for falls and myalgias.  Skin: Negative for rash.  Neurological: Negative for dizziness, tingling, tremors, sensory change, speech change, focal weakness, loss of consciousness and weakness.  Endo/Heme/Allergies: Does not bruise/bleed easily.  Psychiatric/Behavioral: Negative for substance abuse. The patient is not nervous/anxious.   All other systems reviewed and are negative.     PHYSICAL EXAM:  VS:  BP 120/60 (BP Location: Left Arm, Patient Position: Sitting, Cuff Size: Normal)   Pulse 83   Ht 5' 9.5" (1.765 m)   Wt 224 lb (101.6 kg)   BMI 32.60 kg/m  BMI: Body mass index is 32.6 kg/m.  Physical Exam  Constitutional: He is oriented to person, place, and time. He appears well-developed and well-nourished.  HENT:  Head: Normocephalic and atraumatic.  Eyes: Right eye exhibits no discharge. Left eye exhibits no discharge.  Neck: Normal range of motion. No JVD present.  Cardiovascular: Normal rate, S1 normal and S2 normal. An irregularly irregular rhythm present. Exam reveals no distant heart sounds, no friction rub, no midsystolic click and no opening snap.  Murmur heard.  Harsh midsystolic murmur is present with a grade of 1/6 at the upper right sternal border radiating  to the neck. Pulses:      Posterior tibial pulses are 2+ on the right side, and 2+ on the left side.  Pulmonary/Chest: Effort normal and breath sounds normal. No respiratory distress. He has no decreased breath sounds. He has no wheezes. He has no rales. He exhibits no tenderness.  Abdominal: Soft. He exhibits no distension. There is no tenderness.  Musculoskeletal: He exhibits no edema.  Neurological: He is alert and oriented to person, place, and time.  Skin: Skin is warm and dry. No cyanosis. Nails show no clubbing.  Psychiatric: He has a normal mood and affect. His speech is normal and behavior is normal. Judgment and thought content normal.    EKG:  Was ordered and interpreted by me today. Shows Afib, 83 bpm, prior inferior infarct, poor R wave progression, no acute st/t changes   Recent Labs: 03/31/2017: TSH 2.117 04/01/2017: ALT 19 04/23/2017: B Natriuretic Peptide 158.0 04/24/2017: Magnesium 1.8 05/09/2017: BUN 16; Creatinine, Ser 0.75; Hemoglobin 12.3; Platelets 217; Potassium 3.7; Sodium 138  04/25/2017: Cholesterol 80; HDL 29; LDL Cholesterol 36; Total CHOL/HDL Ratio 2.8;  Triglycerides 77; VLDL 15   Estimated Creatinine Clearance: 110.6 mL/min (by C-G formula based on SCr of 0.75 mg/dL).   Wt Readings from Last 3 Encounters:  05/21/17 224 lb (101.6 kg)  05/21/17 223 lb 6.4 oz (101.3 kg)  05/15/17 228 lb (103.4 kg)     Other studies reviewed: Additional studies/records reviewed today include: summarized above  ASSESSMENT AND PLAN:  1. CAD in native coronary artery: No symptoms concerning for angina at this time. He is doing remarkably well since his discharge. Continue DAPT with ASA 81 mg and Plavix 75 mg daily for at least the next 12 months, possibly indefinitely given multiple layers of stents. Continue antianginal therapy with Imdur and Ranexa along with Toprol. Participating in cardiac rehab, continue. Advance daily walking regimen as tolerated. No plans for further ischemic evaluation at this time.   2. Persistent Afib: Ventricular rates well controlled. Continue Toprol and digoxin for rate control. Not on long term, full-dose anticoagulation given need for DAPT and recent GI bleed in 03/2017. No plans for restoring to normal rhythm at this time given inability to anticoagulate. He is aware of stroke risk. CHADS2VASc at least 4 (CHF, HTN, DM, vascular disease).   3. Chronic systolic CHF/ICM: He does not appear volume up at this time. Continue Toprol XL and lisinopril. Patient would unlikely be a good candidate for transition to [facility] given his known issues with hypotension on low dose evidence-based heart failure medications. We almost certainly would not be able to titrate Entresto on him to max dose. Add low-dose spironolactone 12.5 mg daily. Check bmet today and again in 1 week.   4. Status post bioprosthetic aortic valve: Stable by recent echo in 04/2017. Continue to follow clinically and with periodic echocardiograms.   5. HTN: BP well controlled today, and actually higher than it has been in recent check. Typically running in the 90s to low 270J  systolic. Add spironolactone as above. Continue Toprol and lisinopril.   6. High risk medication: Check digoxin level.   7. HLD: LDL at goal as above. Continue Lipitor 80 mg daily.   8. History of GI bleed: No symptoms concerning for recurrence at this time. Check cbc given DAPT.   Disposition: F/u with Dr. Fletcher Anon in 1 month.   Current medicines are reviewed at length with the patient today.  The patient did not have any concerns regarding medicines.  Melvern Banker PA-C 05/21/2017 2:10 PM     [address] [place], [place] [address] [phone]

## 2017-05-22 LAB — BASIC METABOLIC PANEL
BUN/Creatinine Ratio: 18 (ref 10–24)
BUN: 14 mg/dL (ref 8–27)
CO2: 25 mmol/L (ref 20–29)
Calcium: 9.3 mg/dL (ref 8.6–10.2)
Chloride: 97 mmol/L (ref 96–106)
Creatinine, Ser: 0.79 mg/dL (ref 0.76–1.27)
GFR, EST AFRICAN AMERICAN: 110 mL/min/{1.73_m2} (ref >59)
GFR, EST NON AFRICAN AMERICAN: 95 mL/min/{1.73_m2} (ref >59)
Glucose: 349 mg/dL — ABNORMAL HIGH (ref 65–99)
POTASSIUM: 4.6 mmol/L (ref 3.5–5.2)
SODIUM: 139 mmol/L (ref 134–144)

## 2017-05-22 LAB — CBC WITH DIFFERENTIAL/PLATELET
BASOS: 1 %
Basophils Absolute: 0.1 10*3/uL (ref 0.0–0.2)
EOS (ABSOLUTE): 0.1 10*3/uL (ref 0.0–0.4)
EOS: 2 %
HEMATOCRIT: 41.9 % (ref 37.5–51.0)
Hemoglobin: 13.7 g/dL (ref 13.0–17.7)
Immature Grans (Abs): 0 10*3/uL (ref 0.0–0.1)
Immature Granulocytes: 0 %
LYMPHS ABS: 1.5 10*3/uL (ref 0.7–3.1)
Lymphs: 16 %
MCH: 27.1 pg (ref 26.6–33.0)
MCHC: 32.7 g/dL (ref 31.5–35.7)
MCV: 83 fL (ref 79–97)
MONOS ABS: 0.8 10*3/uL (ref 0.1–0.9)
Monocytes: 9 %
Neutrophils Absolute: 6.9 10*3/uL (ref 1.4–7.0)
Neutrophils: 72 %
Platelets: 246 10*3/uL (ref 150–379)
RBC: 5.05 x10E6/uL (ref 4.14–5.80)
RDW: 14.6 % (ref 12.3–15.4)
WBC: 9.4 10*3/uL (ref 3.4–10.8)

## 2017-05-22 LAB — DIGOXIN LEVEL: Digoxin, Serum: <0.4 ng/mL — ABNORMAL LOW (ref 0.5–0.9)

## 2017-05-23 ENCOUNTER — Other Ambulatory Visit: Payer: Self-pay

## 2017-05-23 DIAGNOSIS — Z955 Presence of coronary angioplasty implant and graft: Secondary | ICD-10-CM | POA: Diagnosis not present

## 2017-05-23 DIAGNOSIS — Z79899 Other long term (current) drug therapy: Secondary | ICD-10-CM

## 2017-05-23 DIAGNOSIS — I214 Non-ST elevation (NSTEMI) myocardial infarction: Secondary | ICD-10-CM

## 2017-05-23 LAB — GLUCOSE, CAPILLARY
Glucose-Capillary: 148 mg/dL — ABNORMAL HIGH (ref 65–99)
Glucose-Capillary: 170 mg/dL — ABNORMAL HIGH (ref 65–99)

## 2017-05-23 NOTE — Progress Notes (Signed)
Daily Session Note  Patient Details  Name: Christopher Ashmead Sr. MRN: [id number] Date of Birth: 1951-05-28 Referring Provider:     Cardiac Rehab from 05/21/2017 in [facility]  Referring Provider  Kathlyn Sacramento MD      Encounter Date: 05/23/2017  Check In: Session Check In - 05/23/17 0728      Check-In   Location  [facility]Cardiac [facility] Pulmonary Rehab    Staff Present  Heath Lark, RN, BSN, CCRP;Cisco Kindt Darrin Nipper, [place], ACSM RCEP, Exercise Physiologist    Supervising physician immediately available to respond to emergencies  See telemetry face sheet for immediately available ER MD    Medication changes reported      No    Fall or balance concerns reported     No    Warm-up and Cool-down  Performed on first and last piece of equipment    Resistance Training Performed  Yes    VAD Patient?  No      Pain Assessment   Currently in Pain?  No/denies          Social History   Tobacco Use  Smoking Status Never Smoker  Smokeless Tobacco Never Used    Goals Met:  Independence with exercise equipment Exercise tolerated well No report of cardiac concerns or symptoms Strength training completed today  Goals Unmet:  Not Applicable  Comments: First full day of exercise!  Patient was oriented to gym and equipment including functions, settings, policies, and procedures.  Patient's individual exercise prescription and treatment plan were reviewed.  All starting workloads were established based on the results of the 6 minute walk test done at initial orientation visit.  The plan for exercise progression was also introduced and progression will be customized based on patient's performance and goals.Pt able to follow exercise prescription today without complaint.  Will continue to monitor for progression.   Dr. Emily Filbert is Medical Director for [facility].

## 2017-05-29 ENCOUNTER — Encounter: Payer: BLUE CROSS/BLUE SHIELD | Attending: Cardiovascular Disease

## 2017-05-29 ENCOUNTER — Encounter: Payer: Self-pay | Admitting: Nurse Practitioner

## 2017-05-29 DIAGNOSIS — Z79899 Other long term (current) drug therapy: Secondary | ICD-10-CM | POA: Insufficient documentation

## 2017-05-29 DIAGNOSIS — K219 Gastro-esophageal reflux disease without esophagitis: Secondary | ICD-10-CM | POA: Insufficient documentation

## 2017-05-29 DIAGNOSIS — I2510 Atherosclerotic heart disease of native coronary artery without angina pectoris: Secondary | ICD-10-CM | POA: Insufficient documentation

## 2017-05-29 DIAGNOSIS — E119 Type 2 diabetes mellitus without complications: Secondary | ICD-10-CM | POA: Insufficient documentation

## 2017-05-29 DIAGNOSIS — Z955 Presence of coronary angioplasty implant and graft: Secondary | ICD-10-CM | POA: Insufficient documentation

## 2017-05-29 DIAGNOSIS — I252 Old myocardial infarction: Secondary | ICD-10-CM | POA: Insufficient documentation

## 2017-05-29 DIAGNOSIS — I10 Essential (primary) hypertension: Secondary | ICD-10-CM | POA: Insufficient documentation

## 2017-05-29 DIAGNOSIS — Z7982 Long term (current) use of aspirin: Secondary | ICD-10-CM | POA: Insufficient documentation

## 2017-05-29 DIAGNOSIS — Z7902 Long term (current) use of antithrombotics/antiplatelets: Secondary | ICD-10-CM | POA: Insufficient documentation

## 2017-05-29 DIAGNOSIS — E785 Hyperlipidemia, unspecified: Secondary | ICD-10-CM | POA: Insufficient documentation

## 2017-05-29 DIAGNOSIS — Z7984 Long term (current) use of oral hypoglycemic drugs: Secondary | ICD-10-CM | POA: Insufficient documentation

## 2017-05-29 NOTE — Assessment & Plan Note (Signed)
Currently well controlle.d  Pt reports needs to have refill of pantoprazole.  Refill provided.

## 2017-05-29 NOTE — Assessment & Plan Note (Addendum)
CAD w/ angina was cause for most recent hospital admission.  Multiple coronary artery lesions w/ hx of PCI most recently in August 2018.  Goal for treatment of [name] is to manage angina symptoms and prevent progression of ASCVD.  Defer primary management to cardiology, but will support prevention from PCP.  Plan: 1. Continue aspirin 81 mg - pt requests refill today. 2. Last labs wnl.  Continue regular monitoring of CMP and lipid panel. 3. Follow up as scheduled in about 2 months.

## 2017-05-29 NOTE — Assessment & Plan Note (Signed)
Uncontrolled w/ recent start of metforminER 500 mg once daily.  Pt w/ most recent A1c= 8.1%.  Pt w/ persistently elevated CBG.  Plan: 1. Reviewed low glycemic diet. 2. Encouraged pt to check daily cbg fasting am. 3. START Jardiance 25 mg once daily. - Continue metformin 500 mg once daily.  Not increasing 2/2 cardiac disease at this time. 4. Follow up as scheduled in about 2 months.

## 2017-05-31 ENCOUNTER — Other Ambulatory Visit: Payer: Self-pay

## 2017-05-31 ENCOUNTER — Emergency Department: Payer: BLUE CROSS/BLUE SHIELD

## 2017-05-31 ENCOUNTER — Inpatient Hospital Stay: Payer: BLUE CROSS/BLUE SHIELD

## 2017-05-31 ENCOUNTER — Observation Stay
Admission: EM | Admit: 2017-05-31 | Discharge: 2017-06-01 | Disposition: A | Discharge status: Home / Self Care | Payer: BLUE CROSS/BLUE SHIELD | Attending: Internal Medicine | Admitting: Internal Medicine

## 2017-05-31 DIAGNOSIS — I4892 Unspecified atrial flutter: Secondary | ICD-10-CM | POA: Insufficient documentation

## 2017-05-31 DIAGNOSIS — I25118 Atherosclerotic heart disease of native coronary artery with other forms of angina pectoris: Secondary | ICD-10-CM | POA: Diagnosis not present

## 2017-05-31 DIAGNOSIS — I482 Chronic atrial fibrillation: Secondary | ICD-10-CM | POA: Insufficient documentation

## 2017-05-31 DIAGNOSIS — I110 Hypertensive heart disease with heart failure: Secondary | ICD-10-CM | POA: Insufficient documentation

## 2017-05-31 DIAGNOSIS — I25110 Atherosclerotic heart disease of native coronary artery with unstable angina pectoris: Principal | ICD-10-CM | POA: Insufficient documentation

## 2017-05-31 DIAGNOSIS — Z7984 Long term (current) use of oral hypoglycemic drugs: Secondary | ICD-10-CM | POA: Insufficient documentation

## 2017-05-31 DIAGNOSIS — Z8249 Family history of ischemic heart disease and other diseases of the circulatory system: Secondary | ICD-10-CM | POA: Insufficient documentation

## 2017-05-31 DIAGNOSIS — K219 Gastro-esophageal reflux disease without esophagitis: Secondary | ICD-10-CM | POA: Diagnosis not present

## 2017-05-31 DIAGNOSIS — I255 Ischemic cardiomyopathy: Secondary | ICD-10-CM | POA: Diagnosis not present

## 2017-05-31 DIAGNOSIS — Z953 Presence of xenogenic heart valve: Secondary | ICD-10-CM

## 2017-05-31 DIAGNOSIS — Z7982 Long term (current) use of aspirin: Secondary | ICD-10-CM | POA: Diagnosis not present

## 2017-05-31 DIAGNOSIS — Z955 Presence of coronary angioplasty implant and graft: Secondary | ICD-10-CM | POA: Diagnosis not present

## 2017-05-31 DIAGNOSIS — Z7902 Long term (current) use of antithrombotics/antiplatelets: Secondary | ICD-10-CM | POA: Insufficient documentation

## 2017-05-31 DIAGNOSIS — E785 Hyperlipidemia, unspecified: Secondary | ICD-10-CM | POA: Diagnosis not present

## 2017-05-31 DIAGNOSIS — I200 Unstable angina: Secondary | ICD-10-CM | POA: Diagnosis present

## 2017-05-31 DIAGNOSIS — I5022 Chronic systolic (congestive) heart failure: Secondary | ICD-10-CM | POA: Diagnosis not present

## 2017-05-31 DIAGNOSIS — I252 Old myocardial infarction: Secondary | ICD-10-CM | POA: Diagnosis not present

## 2017-05-31 DIAGNOSIS — R0789 Other chest pain: Secondary | ICD-10-CM | POA: Diagnosis present

## 2017-05-31 DIAGNOSIS — E119 Type 2 diabetes mellitus without complications: Secondary | ICD-10-CM | POA: Insufficient documentation

## 2017-05-31 DIAGNOSIS — Z951 Presence of aortocoronary bypass graft: Secondary | ICD-10-CM | POA: Diagnosis not present

## 2017-05-31 DIAGNOSIS — E876 Hypokalemia: Secondary | ICD-10-CM | POA: Diagnosis not present

## 2017-05-31 LAB — BASIC METABOLIC PANEL
ANION GAP: 10 (ref 5–15)
BUN: 14 mg/dL (ref 6–20)
CALCIUM: 9.5 mg/dL (ref 8.9–10.3)
CO2: 25 mmol/L (ref 22–32)
Chloride: 102 mmol/L (ref 101–111)
Creatinine, Ser: 0.88 mg/dL (ref 0.61–1.24)
Glucose, Bld: 167 mg/dL — ABNORMAL HIGH (ref 65–99)
POTASSIUM: 3.4 mmol/L — AB (ref 3.5–5.1)
SODIUM: 137 mmol/L (ref 135–145)

## 2017-05-31 LAB — GLUCOSE, CAPILLARY
Glucose-Capillary: 130 mg/dL — ABNORMAL HIGH (ref 65–99)
Glucose-Capillary: 145 mg/dL — ABNORMAL HIGH (ref 65–99)
Glucose-Capillary: 177 mg/dL — ABNORMAL HIGH (ref 65–99)

## 2017-05-31 LAB — PROTIME-INR
INR: 1.09
Prothrombin Time: 14 seconds (ref 11.4–15.2)

## 2017-05-31 LAB — CBC
HEMATOCRIT: 43.9 % (ref 40.0–52.0)
HEMOGLOBIN: 14.6 g/dL (ref 13.0–18.0)
MCH: 27.1 pg (ref 26.0–34.0)
MCHC: 33.3 g/dL (ref 32.0–36.0)
MCV: 81.5 fL (ref 80.0–100.0)
Platelets: 203 10*3/uL (ref 150–440)
RBC: 5.38 MIL/uL (ref 4.40–5.90)
RDW: 15.1 % — ABNORMAL HIGH (ref 11.5–14.5)
WBC: 8.5 10*3/uL (ref 3.8–10.6)

## 2017-05-31 LAB — HEPARIN LEVEL (UNFRACTIONATED): Heparin Unfractionated: 0.44 IU/mL (ref 0.30–0.70)

## 2017-05-31 LAB — DIGOXIN LEVEL: Digoxin Level: <0.2 ng/mL — ABNORMAL LOW (ref 0.8–2.0)

## 2017-05-31 LAB — TROPONIN I
TROPONIN I: 0.04 ng/mL — AB (ref <0.03)
Troponin I: 0.04 ng/mL (ref <0.03)

## 2017-05-31 LAB — APTT: APTT: 28 s (ref 24–36)

## 2017-05-31 MED ORDER — ASPIRIN EC 81 MG PO TBEC
162.0000 mg | DELAYED_RELEASE_TABLET | Freq: Every day | ORAL | Status: DC
  Administered 2017-06-01: 162 mg via ORAL
  Filled 2017-05-31: qty 2

## 2017-05-31 MED ORDER — ACETAMINOPHEN 325 MG PO TABS: 650.0000 mg | ORAL_TABLET | Freq: Four times a day (QID) | ORAL | Status: DC | PRN

## 2017-05-31 MED ORDER — NITROGLYCERIN 2 % TD OINT
1.0000 [in_us] | TOPICAL_OINTMENT | Freq: Four times a day (QID) | TRANSDERMAL | Status: DC
  Administered 2017-05-31: 1 [in_us] via TOPICAL
  Filled 2017-05-31: qty 1

## 2017-05-31 MED ORDER — CLOPIDOGREL BISULFATE 75 MG PO TABS
75.0000 mg | ORAL_TABLET | Freq: Every day | ORAL | Status: DC
  Administered 2017-06-01: 75 mg via ORAL
  Filled 2017-05-31: qty 1

## 2017-05-31 MED ORDER — ONDANSETRON HCL 4 MG/2ML IJ SOLN
4.0000 mg | Freq: Once | INTRAMUSCULAR | Status: AC
  Administered 2017-05-31: 4 mg via INTRAVENOUS

## 2017-05-31 MED ORDER — ISOSORBIDE MONONITRATE 15 MG HALF TABLET: 15.0000 mg | ORAL_TABLET | Freq: Every day | ORAL | Status: DC

## 2017-05-31 MED ORDER — ASPIRIN 81 MG PO CHEW
324.0000 mg | CHEWABLE_TABLET | Freq: Once | ORAL | Status: AC
  Administered 2017-05-31: 324 mg via ORAL
  Filled 2017-05-31: qty 4

## 2017-05-31 MED ORDER — ONDANSETRON HCL 4 MG/2ML IJ SOLN
INTRAMUSCULAR | Status: AC
  Filled 2017-05-31: qty 2

## 2017-05-31 MED ORDER — NITROGLYCERIN 0.4 MG SL SUBL
0.4000 mg | SUBLINGUAL_TABLET | Freq: Once | SUBLINGUAL | Status: AC
  Administered 2017-05-31: 0.4 mg via SUBLINGUAL
  Filled 2017-05-31: qty 1

## 2017-05-31 MED ORDER — LISINOPRIL 5 MG PO TABS
5.0000 mg | ORAL_TABLET | Freq: Every day | ORAL | Status: DC
  Filled 2017-05-31: qty 1

## 2017-05-31 MED ORDER — MORPHINE SULFATE (PF) 2 MG/ML IV SOLN
2.0000 mg | INTRAVENOUS | Status: DC | PRN
  Administered 2017-05-31: 2 mg via INTRAVENOUS
  Filled 2017-05-31: qty 1

## 2017-05-31 MED ORDER — INSULIN ASPART 100 UNIT/ML ~~LOC~~ SOLN
0.0000 [IU] | Freq: Three times a day (TID) | SUBCUTANEOUS | Status: DC
  Administered 2017-05-31 – 2017-06-01 (×3): 2 [IU] via SUBCUTANEOUS
  Filled 2017-05-31 (×3): qty 1

## 2017-05-31 MED ORDER — NITROGLYCERIN 2 % TD OINT: 0.5000 [in_us] | TOPICAL_OINTMENT | Freq: Four times a day (QID) | TRANSDERMAL | Status: DC

## 2017-05-31 MED ORDER — ATORVASTATIN CALCIUM 20 MG PO TABS
80.0000 mg | ORAL_TABLET | Freq: Every day | ORAL | Status: DC
  Administered 2017-05-31: 80 mg via ORAL
  Filled 2017-05-31: qty 4

## 2017-05-31 MED ORDER — ISOSORBIDE MONONITRATE ER 60 MG PO TB24
30.0000 mg | ORAL_TABLET | Freq: Every day | ORAL | Status: DC
Start: 2017-05-31 — End: 2017-05-31
  Administered 2017-05-31: 30 mg via ORAL

## 2017-05-31 MED ORDER — HEPARIN (PORCINE) IN NACL 100-0.45 UNIT/ML-% IJ SOLN
1150.0000 [IU]/h | INTRAMUSCULAR | Status: DC
  Administered 2017-05-31: 1150 [IU]/h via INTRAVENOUS
  Filled 2017-05-31 (×2): qty 250

## 2017-05-31 MED ORDER — NITROGLYCERIN 0.4 MG SL SUBL
SUBLINGUAL_TABLET | SUBLINGUAL | Status: AC
  Administered 2017-05-31: 0.4 mg via SUBLINGUAL
  Filled 2017-05-31: qty 1

## 2017-05-31 MED ORDER — NITROGLYCERIN 0.4 MG SL SUBL: 0.4000 mg | SUBLINGUAL_TABLET | SUBLINGUAL | Status: DC | PRN

## 2017-05-31 MED ORDER — RANOLAZINE ER 500 MG PO TB12
1000.0000 mg | ORAL_TABLET | Freq: Two times a day (BID) | ORAL | Status: DC
  Administered 2017-05-31 – 2017-06-01 (×3): 1000 mg via ORAL
  Filled 2017-05-31 (×4): qty 2

## 2017-05-31 MED ORDER — ONDANSETRON HCL 4 MG/2ML IJ SOLN: 4.0000 mg | Freq: Four times a day (QID) | INTRAMUSCULAR | Status: DC | PRN

## 2017-05-31 MED ORDER — CLOPIDOGREL BISULFATE 75 MG PO TABS: 75.0000 mg | ORAL_TABLET | Freq: Every day | ORAL | Status: DC

## 2017-05-31 MED ORDER — ACETAMINOPHEN 650 MG RE SUPP: 650.0000 mg | Freq: Four times a day (QID) | RECTAL | Status: DC | PRN

## 2017-05-31 MED ORDER — MORPHINE SULFATE (PF) 2 MG/ML IV SOLN
2.0000 mg | Freq: Once | INTRAVENOUS | Status: AC
  Administered 2017-05-31: 2 mg via INTRAVENOUS

## 2017-05-31 MED ORDER — SPIRONOLACTONE 25 MG PO TABS
25.0000 mg | ORAL_TABLET | Freq: Every day | ORAL | Status: DC
  Administered 2017-05-31 – 2017-06-01 (×2): 25 mg via ORAL
  Filled 2017-05-31: qty 1

## 2017-05-31 MED ORDER — METOPROLOL SUCCINATE ER 50 MG PO TB24
100.0000 mg | ORAL_TABLET | Freq: Every day | ORAL | Status: DC
  Administered 2017-05-31: 100 mg via ORAL
  Filled 2017-05-31: qty 2

## 2017-05-31 MED ORDER — PANTOPRAZOLE SODIUM 40 MG PO TBEC
40.0000 mg | DELAYED_RELEASE_TABLET | Freq: Every day | ORAL | Status: DC
  Administered 2017-05-31 – 2017-06-01 (×2): 40 mg via ORAL
  Filled 2017-05-31: qty 1

## 2017-05-31 MED ORDER — ONDANSETRON HCL 4 MG PO TABS: 4.0000 mg | ORAL_TABLET | Freq: Four times a day (QID) | ORAL | Status: DC | PRN

## 2017-05-31 MED ORDER — DIGOXIN 0.0625 MG HALF TABLET
0.0625 mg | ORAL_TABLET | Freq: Every day | ORAL | Status: DC
  Administered 2017-05-31 – 2017-06-01 (×2): 0.0625 mg via ORAL
  Filled 2017-05-31 (×2): qty 1

## 2017-05-31 MED ORDER — HEPARIN BOLUS VIA INFUSION
4000.0000 [IU] | Freq: Once | INTRAVENOUS | Status: AC
  Administered 2017-05-31: 4000 [IU] via INTRAVENOUS
  Filled 2017-05-31: qty 4000

## 2017-05-31 MED ORDER — NITROGLYCERIN 0.4 MG SL SUBL
0.4000 mg | SUBLINGUAL_TABLET | Freq: Once | SUBLINGUAL | Status: AC
  Administered 2017-05-31: 0.4 mg via SUBLINGUAL

## 2017-05-31 MED ORDER — BISACODYL 5 MG PO TBEC: 5.0000 mg | DELAYED_RELEASE_TABLET | Freq: Every day | ORAL | Status: DC | PRN

## 2017-05-31 MED ORDER — MORPHINE SULFATE (PF) 2 MG/ML IV SOLN
INTRAVENOUS | Status: AC
  Filled 2017-05-31: qty 1

## 2017-05-31 MED ORDER — CLOPIDOGREL BISULFATE 75 MG PO TABS
75.0000 mg | ORAL_TABLET | Freq: Once | ORAL | Status: AC
  Administered 2017-05-31: 75 mg via ORAL
  Filled 2017-05-31: qty 1

## 2017-05-31 MED ORDER — DOCUSATE SODIUM 100 MG PO CAPS
100.0000 mg | ORAL_CAPSULE | Freq: Two times a day (BID) | ORAL | Status: DC
  Administered 2017-05-31 (×2): 100 mg via ORAL
  Filled 2017-05-31 (×2): qty 1

## 2017-05-31 NOTE — Consult Note (Signed)
Cardiology Consultation:   Patient ID: Christopher Koury Sr.; [id number]; 1951/04/21   Admit date: 05/31/2017 Date of Consult: 05/31/2017  Primary Care Provider: [name], NP Primary Cardiologist: Fletcher Anon   Patient Profile:   Christopher Ivory Sr. is a 66 y.o. male with a hx of CAD s/p CABG with LIMA-LAD, LCx stenting, and bioprosthetic aortic valve replacement in [place], [place], also with history of ICM/chronic systolic CHF, persistent Afib not on full dose anticoagulation due to recurrent and recent GI bleed, hypertension, hyperlipidemia, and diabetes who is being seen today for the evaluation of chest pain at the request of Dr. Vianne Cummings.  History of Present Illness:   [name] had a NSTEMI in August, 2018 requiring hospitalization at [facility]. He was found to have ISR of the LCx, as well as a diagonal. The diagonal was a long segment of stents, not suitable for angioplasty, and was left to be treated weekly. He underwent PTCA for ISR in the proximal and distal LCx. Echo in 01/2017 was technically difficult. He subsequently had a lower GI bleed in September, 2018 though dual antiplatelet therapy was not interrupted. He was admitted to [facility] in late September, 2018 with unstable angina. He underwent cardiac catheterization which showed restenosis in the LCx. He underwent repeat cutting balloon angioplasty to the LCx and diagonal. The LCx results were optimal but the diagonal angioplasty was suboptimal with 50% residual stenosis. Echo at that time showed an EF of 35-40% with normal functioning bioprosthetic valve, mild mitral regurgitation, and no pulmonary hypertension. He was seen in our office in mid October, 2018 and referred to cardiac rehabilitation at that time. It was felt the diagonal artery was high risk for occlusion and probably not worth attempting angioplasty in the future. It was felt the LCx could be treated with a drug-eluting stent if he tolerated  long-term dual antiplatelet therapy. He was noted to be in A. fib with RVR at that time and his metoprolol was increased along with discontinuation of isosorbide in an effort to avoid hypotension. It was preferred to avoid anticoagulation at that time given his recent GI bleed and need for dual antiplatelet therapy as above. He was admitted in late October, 2018 for unstable angina in the setting of Afib/flutter with RVR. He underwent Lexiscan Myoview that showed small in size, moderate in severity, reversible defect involving the apical anterior and apical lateral segments c/w ischemia. There was also a small in size, mild in severity, reversible defect involving the mid anterolateral segment c/w ischemia. There was a small in size, severe, fixed defect involving the apical inferior and apical segments c/w scar. EF ~ 46%. EKG demonstrated subtle inferior st segment elevation and Afib with RVR immediately following administration of Lexiscan. This was an abnormal study, potentially high risk. Given his comorbidities he was medically managed. It was felt, given his underlying CAD, when he developed tachycardic heart rates he had angina. Better rate control along with addition of Imdur were done.   He was readmitted in early November, 2018 with a NSTEMI with a peak troponin of 0.26. Heart rates were well controlled. He underwent repeat LHC on 05/08/2017 that showed significant two-vessel CAD, including diffusely diseased LAD that was similar to prior studies, along with significant ISR involving D1 (99%) and the proximal LCx (705%). The distal LCx demonstrated ISR up to 30%. RCA demonstrated mild disease that was similar to prior studies. He underwent successful PCI/DES to the proximal LCx with a Resolute Onyx drug-eluting  stent (2.25 x 22 mm) with 0% residual stenosis and TIMI-3 flow. Discharge renal function and cbc were stable. He saw the [facility] on 11/9, weight was 228 pounds (discharge weight on  11/7 of 226 pounds), no changes were made. He was most recently seen in the office on 05/21/17 and doing quite well at that time, without chest pain or SOB. He was compliant with his medications. Weight was stable. Patient has been ambulating 3 miles daily since 11/20 without any issues, most recently walked 3 miles on 11/28 without issues. He has not missed any doses of his medications. He was driving to cardiac rehab this morning in his usual state of health. Upon arriving to the parking lot at [facility] he developed 9/10 substernal chest pain with associated numbness of the chest wall. Otherwise no associated symptoms. Pain does not feel similar to his prior pain. He otherwise feels "great."   Upon the patient's arrival to [facility] they were found to have stable vitals. Labs showed troponin 0.04, Na 137, K+ 3.4, SCr 0.88, BUN 14, glucose 167, WBC 8.5, HGB 14.6, PLT 203. CXR without acute process. EKG showed atrial flutter, 88 bpm, nonspecific st/t changes. He was given ASA 324 mg , Plavix 75 mg, morphine, and SL NTG in the ED. Currently, with 5/10 chest pain.   Past Medical History:  Diagnosis Date  . CAD (coronary artery disease)    a. s/p CABG (LIMA-LAD), LCx stenting s/p PTCA ISR LCx 01/2017 s/p cutting balloon angioplasty to LCx and Diag 04/2017 s/p PCI/DES to LCx 11/18, Diag not felt to be amenable to further interventions  . Chronic systolic CHF (congestive heart failure) ([place])    a. TTE 10/18: EF of 35-40% with normal functioning bioprosthetic valve, mild mitral regurgitation, and no pulmonary hypertension  . Colitis   . Diabetes mellitus with complication ([place])   . Diverticulitis   . Essential hypertension   . H/O aortic valve replacement    a. bioprosthetic arotic valve in [place]  . Hyperlipidemia     Past Surgical History:  Procedure Laterality Date  . AORTIC VALVE REPLACEMENT    . CORONARY ARTERY BYPASS GRAFT    . CORONARY BALLOON ANGIOPLASTY N/A 04/02/2017   Procedure: CORONARY BALLOON  ANGIOPLASTY;  Surgeon: Christopher Sine, MD;  Location: [place] [facility];  Service: Cardiovascular;  Laterality: N/A;  . CORONARY STENT INTERVENTION N/A 02/26/2017   Procedure: CORONARY STENT INTERVENTION;  Surgeon: Wellington Hampshire, MD;  Location: [place] [facility];  Service: Cardiovascular;  Laterality: N/A;  . CORONARY STENT INTERVENTION N/A 05/08/2017   Procedure: CORONARY STENT INTERVENTION;  Surgeon: Nelva Bush, MD;  Location: [place] [facility];  Service: Cardiovascular;  Laterality: N/A;  . CORONARY/GRAFT ANGIOGRAPHY N/A 02/26/2017   Procedure: CORONARY/GRAFT ANGIOGRAPHY;  Surgeon: Wellington Hampshire, MD;  Location: [place] [facility];  Service: Cardiovascular;  Laterality: N/A;  . CORONARY/GRAFT ANGIOGRAPHY N/A 04/02/2017   Procedure: CORONARY/GRAFT ANGIOGRAPHY;  Surgeon: Christopher Sine, MD;  Location: [place] [facility];  Service: Cardiovascular;  Laterality: N/A;  . CORONARY/GRAFT ANGIOGRAPHY N/A 05/08/2017   Procedure: CORONARY/GRAFT ANGIOGRAPHY;  Surgeon: Nelva Bush, MD;  Location: [place] [facility];  Service: Cardiovascular;  Laterality: N/A;  . JOINT REPLACEMENT Left    KNEE  . KNEE ARTHROPLASTY  2000     Home Meds: Prior to Admission medications   Medication Sig Start Date End Date Taking? Authorizing Provider  aspirin EC 81 MG tablet Take 2 tablets (162 mg total) daily  by mouth. 05/15/17  Yes Christopher College, NP  atorvastatin (LIPITOR) 80 MG tablet Take 1 tablet (80 mg total) by mouth at bedtime. 04/30/17  Yes Christopher College, NP  clopidogrel (PLAVIX) 75 MG tablet Take 1 tablet (75 mg total) daily by mouth. 05/21/17  Yes Martell Mcfadyen, Areta Haber, PA-C  digoxin (LANOXIN) 0.125 MG tablet Take 0.5 tablets (0.0625 mg total) by mouth daily. 04/26/17 04/26/18 Yes Gouru, Illene Silver, MD  empagliflozin (JARDIANCE) 25 MG TABS tablet Take 25 mg daily by mouth. 05/15/17  Yes Christopher College, NP  isosorbide mononitrate (IMDUR) 30 MG 24 hr tablet Take 0.5 tablets (15 mg  total) by mouth daily. 04/27/17  Yes Gouru, Illene Silver, MD  lisinopril (PRINIVIL,ZESTRIL) 5 MG tablet Take 1 tablet (5 mg total) at bedtime by mouth. 05/21/17  Yes Denny Mccree, Areta Haber, PA-C  metFORMIN (GLUCOPHAGE-XR) 500 MG 24 hr tablet Take 1 tablet (500 mg total) daily with breakfast by mouth. 05/15/17 05/15/18 Yes Christopher College, NP  metoprolol succinate (TOPROL-XL) 100 MG 24 hr tablet Take 1 tablet (100 mg total) daily by mouth. Take with or immediately following a meal. 05/09/17  Yes Gladstone Lighter, MD  pantoprazole (PROTONIX) 40 MG tablet Take 1 tablet (40 mg total) daily by mouth. 05/21/17  Yes Berthel Bagnall, Areta Haber, PA-C  ranolazine (RANEXA) 500 MG 12 hr tablet Take 1 tablet (500 mg total) by mouth 2 (two) times daily. 04/26/17  Yes Gouru, Illene Silver, MD  spironolactone (ALDACTONE) 25 MG tablet Take 0.5 tablets (12.5 mg total) daily by mouth. 05/21/17 08/19/17 Yes Jasier Calabretta, Areta Haber, PA-C  acetaminophen (TYLENOL) 325 MG tablet Take 2 tablets (650 mg total) by mouth every 6 (six) hours as needed for mild pain (or Fever >/= 101). 04/26/17   Nicholes Mango, MD  blood glucose meter kit and supplies Dispense based on patient and insurance preference. Use 1 time daily as directed. (FOR ICD-9 250.00, 250.01). 04/30/17   Christopher College, NP  Blood Pressure Monitoring (BLOOD PRESSURE CUFF) MISC 1 Units by Does not apply route daily. 04/30/17   Christopher College, NP  meclizine (ANTIVERT) 25 MG tablet Take 25 mg by mouth 3 (three) times daily as needed for dizziness.    [provider]  nitroGLYCERIN (NITROSTAT) 0.4 MG SL tablet Place 1 tablet (0.4 mg total) under the tongue every 5 (five) minutes as needed for chest pain. 04/16/17   Wellington Hampshire, MD    Inpatient Medications: Scheduled Meds:  Continuous Infusions:  PRN Meds:   Allergies:   Allergies  Allergen Reactions  . Metformin And Related Other (See Comments)    Only the regular Metformin (diarrhea)    Social History:   Social History     Socioeconomic History  . Marital status: Divorced    Spouse name: Not on file  . Number of children: 1  . Years of education: 61  . Highest education level: 12th grade  Social Needs  . Financial resource strain: Not hard at all  . Food insecurity - worry: Never true  . Food insecurity - inability: Never true  . Transportation needs - medical: No  . Transportation needs - non-medical: No  Occupational History  . Not on file  Tobacco Use  . Smoking status: Never Smoker  . Smokeless tobacco: Never Used  Substance and Sexual Activity  . Alcohol use: No    Comment: No Alcohol 12 years, but drank 2 "bottles of liquor" each week for several years prior to quitting  . Drug use:  No  . Sexual activity: Not Currently  Other Topics Concern  . Not on file  Social History Narrative  . Not on file     Family History:  Family History  Problem Relation Age of Onset  . CAD Mother   . Heart failure Mother   . Lupus Mother   . CAD Brother   . Heart attack Brother   . Prostate cancer Father   . Diabetes Sister   . Healthy Paternal Grandmother   . Prostate cancer Paternal Grandfather   . Healthy Brother   . Healthy Sister     ROS:  Review of Systems  Constitutional: Negative for chills, diaphoresis, fever, malaise/fatigue and weight loss.  HENT: Negative for congestion.   Eyes: Negative for discharge and redness.  Respiratory: Negative for cough, hemoptysis, sputum production, shortness of breath and wheezing.   Cardiovascular: Positive for chest pain. Negative for palpitations, orthopnea, claudication, leg swelling and PND.  Gastrointestinal: Negative for abdominal pain, blood in stool, heartburn, melena, nausea and vomiting.  Genitourinary: Negative for hematuria.  Musculoskeletal: Negative for falls and myalgias.  Skin: Negative for rash.  Neurological: Positive for sensory change. Negative for dizziness, tingling, tremors, speech change, focal weakness, loss of consciousness  and weakness.  Endo/Heme/Allergies: Does not bruise/bleed easily.  Psychiatric/Behavioral: Negative for substance abuse. The patient is nervous/anxious.   All other systems reviewed and are negative.     Physical Exam/Data:   Vitals:   05/31/17 0915 05/31/17 0930 05/31/17 0945 05/31/17 1000  BP: 125/76 106/74 111/64 104/67  Pulse: 86 81 (!) 37 76  Resp: _0 Temp:      TempSrc:      SpO2: 98% 98% 97% 98%  Weight:      Height:       No intake or output data in the 24 hours ending 05/31/17 1016 Filed Weights   05/31/17 0801  Weight: 220 lb (99.8 kg)   Body mass index is 32.49 kg/m.   Physical Exam: General: Well developed, well nourished, in no acute distress. Head: Normocephalic, atraumatic, sclera non-icteric, no xanthomas, nares without discharge.  Neck: Negative for carotid bruits. JVD not elevated. Lungs: Clear bilaterally to auscultation without wheezes, rales, or rhonchi. Breathing is unlabored. Heart: Irregular with S1 S2. No murmurs, rubs, or gallops appreciated. Abdomen: Soft, non-tender, non-distended with normoactive bowel sounds. No hepatomegaly. No rebound/guarding. No obvious abdominal masses. Msk:  Strength and tone appear normal for age. Extremities: No clubbing or cyanosis. No edema. Distal pedal pulses are 2+ and equal bilaterally. Neuro: Alert and oriented X 3. No facial asymmetry. No focal deficit. Moves all extremities spontaneously. Psych:  Responds to questions appropriately with a normal affect.   EKG:  The EKG was personally reviewed and demonstrates: atrial flutter, 88 bpm, nonspecific st/t changes Telemetry:  Telemetry was personally reviewed and demonstrates: atrial flutter  Weights: Filed Weights   05/31/17 0801  Weight: 220 lb (99.8 kg)    Relevant CV Studies: LHC 05/08/2017: Coronary Findings   Diagnostic  Dominance: Right  Left Main  Vessel is angiographically normal.  Left Anterior Descending  Prox LAD lesion 70%  stenosed  Prox LAD lesion is 70% stenosed.  Mid LAD lesion 70% stenosed  Mid LAD lesion is 70% stenosed.  Dist LAD lesion 80% stenosed  Dist LAD lesion is 80% stenosed.  First Diagonal Branch  Ost 1st Diag to 1st Diag lesion 99% stenosed  Ost 1st Diag to 1st Diag lesion is 99% stenosed. The lesion  was previously treated using a stent (unknown type) and angioplasty . Previously placed stent displays restenosis.  Left Circumflex  Prox Cx lesion 70% stenosed  Prox Cx lesion is 70% stenosed. The lesion was previously treated using a stent (unknown type) and angioplasty .  Dist Cx lesion 30% stenosed  Dist Cx lesion is 30% stenosed. The lesion was previously treated using a stent (unknown type) and angioplasty .  First Obtuse Marginal Branch  Vessel is small in size. There is mild disease in the vessel.  Third Obtuse Marginal Branch  Ost 3rd Mrg lesion 90% stenosed  Ost 3rd Mrg lesion is 90% stenosed.  Right Coronary Artery  Prox RCA lesion 10% stenosed  Prox RCA lesion is 10% stenosed. The lesion was previously treated.  Mid RCA lesion 30% stenosed  Mid RCA lesion is 30% stenosed.  Right Posterior Descending Artery  Vessel is angiographically normal.  Right Posterior Atrioventricular Branch  There is mild disease in the vessel.  First Right Posterolateral  There is mild disease in the vessel.  Second Right Posterolateral  There is mild disease in the vessel.  Third Right Posterolateral  There is mild disease in the vessel.  Free LIMA Graft to Dist LAD  LIMA.  Intervention   Prox Cx lesion  Stent  Lesion length: 18 mm. Lesion crossed with guidewire using a WIRE RUNTHROUGH .[id number]. Pre-stent angioplasty was performed using a BALLOON ANGIOSCULPT RX 2.5X10. Maximum pressure: 10 atm. A drug-eluting stent was successfully placed using a STENT RESOLUTE ONYX 2.25X22. Maximum pressure: 12 atm. Stent strut is well apposed. Post-stent angioplasty was performed using a BALLOON [place] TREK RX  2.5X15. Maximum pressure: 16 atm.  Post-Intervention Lesion Assessment  The intervention was successful. Pre-interventional TIMI flow is 3. Post-intervention TIMI flow is 3. No complications occurred at this lesion.  There is no residual stenosis post intervention.  Coronary Diagrams   Diagnostic Diagram       Post-Intervention Diagram        Conclusions: 1. Significant two-vessel coronary artery disease, including diffusely diseased LAD that is similar to prior studies. Significant in-stent restenosis involving D1 (99%) and proximal LCx (70%) is also present. 2. Distal LCx stent demonstrates mild to moderatein-stent restenosis of up to 30%. 3. RCA demonstrates mild disease, similar to prior studies. 4. Successful PCI to proximal LCx with placement of a Resolute Onyx 2.25 x 22 mm drug-eluting stent (post-dilated to 2.6 mm) with 0% residual stenosis and TIMI-3 flow.  Recommendations: 1. Continue dual antiplatelet therapy with aspirin and clopidogrel for at least 12 months. 2. Aggressive secondary prevention. 3. Medical management of subtotally occluded diagonal branch. This vessel is not amenable to further percutaneous interventions.  LHC 04/02/2017: Coronary Findings   Dominance: Right  Left Main  Vessel is angiographically normal.  Left Anterior Descending  Mid LAD lesion, 70% stenosed.  Dist LAD lesion, 80% stenosed.  First Diagonal Branch  Ost 1st Diag to 1st Diag lesion, 95% stenosed. The lesion was previously treated.  Angioplasty: Angioplasty alone was performed using a BALLOON [place] RX 2.0X12.  Supplies used: BALLOON WOLVERINE 2.00X10; BALLOON WOLVERINE 2.50X10; [place] RX2.5X12  There is a 50% residual stenosis post intervention.  1st Diag lesion, 70% stenosed.  Left Circumflex  Ost Cx to Mid Cx lesion, 90% stenosed. The lesion was previously treated.  Angioplasty: Angioplasty alone was performed using a BALLOON WOLVERINE 2.50X10.  Supplies used:  BALLOON WOLVERINE 3.00X10  There is a 10% residual stenosis post intervention.  Dist Cx lesion, 70%  stenosed. The lesion was previously treated.  Angioplasty: A stent was successfully placed.  There is a 10% residual stenosis post intervention.  First Obtuse Marginal Branch  Vessel is small in size. There is mild disease in the vessel.  Third Obtuse Marginal Branch  Ost 3rd Mrg lesion, 90% stenosed.  Right Coronary Artery  Prox RCA lesion, 10% stenosed. The lesion was previously treated.  Mid RCA lesion, 30% stenosed.  Right Posterior Descending Artery  Vessel is angiographically normal.  Right Posterior Atrioventricular Branch  There is mild disease in the vessel.  First Right Posterolateral  There is mild disease in the vessel.  Second Right Posterolateral  There is mild disease in the vessel.  Third Right Posterolateral  There is mild disease in the vessel.  Graft Angiography  Free LIMA Graft to Dist LAD  LIMA and is anatomically normal.  Coronary Diagrams   Diagnostic Diagram       Post-Intervention Diagram          TTE 04/02/2017: Study Conclusions  - Left ventricle: The cavity size was normal. Systolic function was moderately reduced. The estimated ejection fraction was in the range of 35% to 40%. Diffuse hypokinesis. The study was not technically sufficient to allow evaluation of LV diastolic dysfunction due to atrial fibrillation. - Aortic valve: A bioprosthesis sits well in the aortic position. Mobility was not restricted. There was no regurgitation. Mean gradient (S): 14 mm Hg. Peak gradient (S): 23 mm Hg. Valve area (VTI): 1.17 cm^2. Valve area (Vmax): 1.27 cm^2. Valve area (Vmean): 1.17 cm^2. - Mitral valve: There was mild regurgitation. - Right ventricle: The cavity size was mildly dilated. Wall thickness was normal. Systolic function was mildly reduced. - Tricuspid valve: There was trivial regurgitation. - Pulmonary  arteries: Systolic pressure was within the normal range.  Impressions:  - When compared to the prior study from 02/25/2017 LVEF has decreased from 50-55% to 35-40%. Gradients across the bioprosthetic valve are normal. No aortic regurgitation or paravalvular leak. RV is mildly dilated with mildly decreased systolic function.   LHC 02/26/2017: Coronary Findings   Dominance: Right  Left Main  Vessel is angiographically normal.  Left Anterior Descending  Mid LAD lesion, 70% stenosed.  Dist LAD lesion, 100% stenosed.  First Diagonal Branch  Ost 1st Diag to 1st Diag lesion, 95% stenosed. The lesion was previously treated.  1st Diag lesion, 70% stenosed.  Left Circumflex  Prox Cx lesion, 70% stenosed. The lesion was previously treated.  Angioplasty: Lesion crossed with guidewire. Angioplasty alone was performed. The pre-interventional distal flow is normal (TIMI 3). The post-interventional distal flow is normal (TIMI 3). The intervention was successful . No complications occurred at this lesion.  There is a 15% residual stenosis post intervention.  Dist Cx lesion, 95% stenosed. The lesion was previously treated.  Angioplasty: Angioplasty alone was performed. The pre-interventional distal flow is normal (TIMI 3). The post-interventional distal flow is normal (TIMI 3). The intervention was successful . No complications occurred at this lesion.  There is a 30% residual stenosis post intervention.  First Obtuse Marginal Branch  Vessel is small in size. There is mild disease in the vessel.  Third Obtuse Marginal Branch  Ost 3rd Mrg lesion, 90% stenosed.  Right Coronary Artery  Prox RCA lesion, 10% stenosed. The lesion was previously treated.  Mid RCA lesion, 30% stenosed.  Right Posterior Descending Artery  Vessel is angiographically normal.  Right Posterior Atrioventricular Branch  There is mild disease in the vessel.  First Right  Posterolateral  There is mild disease in  the vessel.  Second Right Posterolateral  There is mild disease in the vessel.  Third Right Posterolateral  There is mild disease in the vessel.  Graft Angiography  Free LIMA Graft to Dist LAD  LIMA and is anatomically normal.  Coronary Diagrams   Diagnostic Diagram       Post-Intervention Diagram         Lexiscan Myoview 04/24/2017:  Abnormal, potentially high risk pharmacologic myocardial perfusion stress test.  There is a small in size, moderate in severity, reversible defect involving the apical anterior and apical lateral segments consistent with ischemia.  There is a small in size, mild in severity, reversible defect involving the mid anterolateral segment consistent with ischemia.  There is a small in size, severe, fixed defect involving the apical inferior and apical segments consistent with scar.  The left ventricular ejection fraction is mildly decreased (46%).  EKG demonstrates subtle inferior ST-segment elevation and atrial fibrillation with rapid ventricular response immediately following administration of regadenoson.   Laboratory Data:  Chemistry Recent Labs  Lab 05/31/17 0802  NA 137  K 3.4*  CL 102  CO2 25  GLUCOSE 167*  BUN 14  CREATININE 0.88  CALCIUM 9.5  GFRNONAA >60  GFRAA >60  ANIONGAP 10    No results for input(s): PROT, ALBUMIN, AST, ALT, ALKPHOS, BILITOT in the last 168 hours. Hematology Recent Labs  Lab 05/31/17 0802  WBC 8.5  RBC 5.38  HGB 14.6  HCT 43.9  MCV 81.5  MCH 27.1  MCHC 33.3  RDW 15.1*  PLT 203   Cardiac Enzymes Recent Labs  Lab 05/31/17 0802  TROPONINI 0.04*   No results for input(s): TROPIPOC in the last 168 hours.  BNPNo results for input(s): BNP, PROBNP in the last 168 hours.  DDimer No results for input(s): DDIMER in the last 168 hours.  Radiology/Studies:  Dg Chest Port 1 View  Result Date: 05/31/2017 IMPRESSION: Chronic lung changes without evidence of superimposed acute cardiopulmonary  disease. Surgical changes of median sternotomy and valve repair Electronically Signed   By: Corrie Mckusick D.O.   On: 05/31/2017 09:23    Assessment and Plan:   1. Unstable angina/CAD: -Patient has been very active since he was seen on 11/19, walking 3 miles daily without any chest pain or SOB -Developed chest pain this morning upon turning into the parking lot at [facility] for cardiac rehab. Pain does not feel similar to prior episodes -Troponin minimally elevated at 0.04, continue to cycle until peak for flat trend is noted -Heparin gtt given ongoing pain -Consider nitropaste  -Has known subtotally occluded diagonal branch that is not amenable to further PCI -Increase Ranexa to 1000 mg bid -BP precludes titration of Imdur at this time -Continue Toprol for antianginal effect -Given his cardiomyopathy and soft BP he is not a candidate for CCB for antianginal effect -Continue DAPT with ASA and Plavix  -Likely medical management unless he has a dynamic elevation of his troponin   2. Atrial flutter/Afib: -Rate controlled -Continue Toprol and digoxin for rate control -Check digoxin level -Not on on long term, full-dose anticoagulation given need for DAPT and recent GI bleed in 03/2017 -No plans for restoring to normal rhythm at this time given inability to anticoagulate -He is aware of stroke risk. CHADS2VASc at least 4 (CHF, HTN, DM, vascular disease)  3. Chronic systolic CHF/ICM: -He does not appear volume up at this time -Continue Toprol XL, lisinopril, spironolactone  -Patient  would unlikely be a good candidate for transition to [facility] given his known issues with hypotension on low dose evidence-based heart failure medications -Daily weights, strict Is and Os  4. Status post bioprosthetic aortic valve: -Stable by recent echo in 04/2017 -Continue to follow clinically and with periodic echocardiograms  5. HTN: -Controlled -Continue current medications -BP likely precludes titration  of Imdur at this time  6. Hypokalemia: -Replete to goal > 4.0 -Continue spironolactone   7. HLD: -Lipitor  8. History of GIB: -No symptoms of further bleeding -Continue DAPT as above   For questions or updates, please contact [place] Please consult [email or web address] for contact info under Cardiology/STEMI.   Signed, Christell Faith, PA-C [place] Pager: [phone] 05/31/2017, 10:16 AM

## 2017-05-31 NOTE — Progress Notes (Signed)
ANTICOAGULATION CONSULT NOTE - Initial Consult  Pharmacy Consult for heparin Indication: chest pain/ACS  Allergies  Allergen Reactions  . Metformin And Related Other (See Comments)    Only the regular Metformin (diarrhea)    Patient Measurements: Height: 5\' 9"  (175.3 cm) Weight: 220 lb (99.8 kg) IBW/kg (Calculated) : 70.7 Heparin Dosing Weight: 91.8 kg  Vital Signs: Temp: 98.5 F (36.9 C) (11/29 0801) Temp Source: Oral (11/29 0801) BP: 104/67 (11/29 1000) Pulse Rate: 76 (11/29 1000)  Labs: Recent Labs    05/31/17 0802  HGB 14.6  HCT 43.9  PLT 203  CREATININE 0.88  TROPONINI 0.04*    Estimated Creatinine Clearance: 98.7 mL/min (by C-G formula based on SCr of 0.88 mg/dL).   Medical History: Past Medical History:  Diagnosis Date  . CAD (coronary artery disease)    a. s/p CABG (LIMA-LAD), LCx stenting s/p PTCA ISR LCx 01/2017 s/p cutting balloon angioplasty to LCx and Diag 04/2017 s/p PCI/DES to LCx 11/18, Diag not felt to be amenable to further interventions  . Chronic systolic CHF (congestive heart failure) (HCC)    a. TTE 10/18: EF of 35-40% with normal functioning bioprosthetic valve, mild mitral regurgitation, and no pulmonary hypertension  . Colitis   . Diabetes mellitus with complication (HCC)   . Diverticulitis   . Essential hypertension   . H/O aortic valve replacement    a. bioprosthetic arotic valve in [place]  . Hyperlipidemia     Medications:  Patient is not on any anticoagulants at home other than aspirin 81mg  daily. Patient is also on clopidogrel at home and received one dose in the ED.   Assessment: 66 yo male seen in ED for ACS/STEMI. Pharmacy has been consulted to dose heparin   Goal of Therapy:  Heparin level 0.3-0.7 units/ml Monitor platelets by anticoagulation protocol: Yes   Plan:  Give 4000 units bolus x 1 Start heparin infusion at 1150 units/hr Check anti-Xa level in 6 hours and daily while on heparin Continue to monitor H&H and  platelets  Yolanda BonineHannah Genna Casimir, PharmD Pharmacy Resident 05/31/2017,10:20 AM

## 2017-05-31 NOTE — ED Notes (Signed)
Patient called out reporting numbness to left side of face. Patient denies any loss of sensation to extremities. No weakness noted to extremities. Facial symmetry noted. EDP and admitting MD made aware.

## 2017-05-31 NOTE — ED Provider Notes (Signed)
[facility] Emergency Department Provider Note   ____________________________________________    I have reviewed the triage vital signs and the nursing notes.   HISTORY  Chief Complaint Chest Pain     HPI Christopher Arora Sr. is a 66 y.o. male who presents with 9 out of 10 chest tightness which started approximately 5 minutes prior to arrival.  Patient with recent NSTEMI with stent placement on November 18.  Was on his way to cardiac rehab today when he developed chest tightness.  He reports he has been feeling quite well over the last week.  He does not smoke.  He has been walking and has not had any chest pressure or anginal symptoms.  He denies shortness of breath.  No nausea or vomiting or diaphoresis.  He has not taken his medications today because of cardiac rehab.  He does have a history of atrial fibrillation   Past Medical History:  Diagnosis Date  . CAD (coronary artery disease)    a. s/p CABG (LIMA-LAD), LCx stenting s/p PTCA ISR LCx 01/2017 s/p cutting balloon angioplasty to LCx and Diag 04/2017 s/p PCI/DES to LCx 11/18, Diag not felt to be amenable to further interventions  . Chronic systolic CHF (congestive heart failure) ([place])    a. TTE 10/18: EF of 35-40% with normal functioning bioprosthetic valve, mild mitral regurgitation, and no pulmonary hypertension  . Colitis   . Diabetes mellitus with complication ([place])   . Diverticulitis   . Essential hypertension   . H/O aortic valve replacement    a. bioprosthetic arotic valve in [place]  . Hyperlipidemia     Patient Active Problem List   Diagnosis Date Noted  . Gastroesophageal reflux disease 05/29/2017  . Chronic systolic heart failure ([place]) 05/12/2017  . NSTEMI (non-ST elevated myocardial infarction) ([place]) 05/07/2017  . H/O prosthetic aortic valve replacement 04/01/2017  . Unstable angina ([place]) 03/31/2017  . HTN (hypertension) 03/12/2017  . Diabetes ([place]) 03/12/2017  . CAD (coronary  artery disease) 03/12/2017  . History of non-ST elevation myocardial infarction (NSTEMI) 02/24/2017    Past Surgical History:  Procedure Laterality Date  . AORTIC VALVE REPLACEMENT    . CORONARY ARTERY BYPASS GRAFT    . CORONARY BALLOON ANGIOPLASTY N/A 04/02/2017   Procedure: CORONARY BALLOON ANGIOPLASTY;  Surgeon: Troy Sine, MD;  Location: [place] [facility];  Service: Cardiovascular;  Laterality: N/A;  . CORONARY STENT INTERVENTION N/A 02/26/2017   Procedure: CORONARY STENT INTERVENTION;  Surgeon: Wellington Hampshire, MD;  Location: [place] [facility];  Service: Cardiovascular;  Laterality: N/A;  . CORONARY STENT INTERVENTION N/A 05/08/2017   Procedure: CORONARY STENT INTERVENTION;  Surgeon: Nelva Bush, MD;  Location: [place] [facility];  Service: Cardiovascular;  Laterality: N/A;  . CORONARY/GRAFT ANGIOGRAPHY N/A 02/26/2017   Procedure: CORONARY/GRAFT ANGIOGRAPHY;  Surgeon: Wellington Hampshire, MD;  Location: [place] [facility];  Service: Cardiovascular;  Laterality: N/A;  . CORONARY/GRAFT ANGIOGRAPHY N/A 04/02/2017   Procedure: CORONARY/GRAFT ANGIOGRAPHY;  Surgeon: Troy Sine, MD;  Location: [place] [facility];  Service: Cardiovascular;  Laterality: N/A;  . CORONARY/GRAFT ANGIOGRAPHY N/A 05/08/2017   Procedure: CORONARY/GRAFT ANGIOGRAPHY;  Surgeon: Nelva Bush, MD;  Location: [place] [facility];  Service: Cardiovascular;  Laterality: N/A;  . JOINT REPLACEMENT Left    KNEE  . KNEE ARTHROPLASTY  2000    Prior to Admission medications   Medication Sig Start Date End Date Taking? Authorizing Provider  aspirin EC 81 MG tablet Take 2 tablets (  162 mg total) daily by mouth. 05/15/17  Yes [name], NP  atorvastatin (LIPITOR) 80 MG tablet Take 1 tablet (80 mg total) by mouth at bedtime. 04/30/17  Yes [name], NP  clopidogrel (PLAVIX) 75 MG tablet Take 1 tablet (75 mg total) daily by mouth. 05/21/17  Yes Dunn, Areta Haber, PA-C  digoxin (LANOXIN) 0.125  MG tablet Take 0.5 tablets (0.0625 mg total) by mouth daily. 04/26/17 04/26/18 Yes Gouru, Illene Silver, MD  empagliflozin (JARDIANCE) 25 MG TABS tablet Take 25 mg daily by mouth. 05/15/17  Yes [name], NP  isosorbide mononitrate (IMDUR) 30 MG 24 hr tablet Take 0.5 tablets (15 mg total) by mouth daily. 04/27/17  Yes Gouru, Illene Silver, MD  lisinopril (PRINIVIL,ZESTRIL) 5 MG tablet Take 1 tablet (5 mg total) at bedtime by mouth. 05/21/17  Yes Dunn, Areta Haber, PA-C  metFORMIN (GLUCOPHAGE-XR) 500 MG 24 hr tablet Take 1 tablet (500 mg total) daily with breakfast by mouth. 05/15/17 05/15/18 Yes [name], NP  metoprolol succinate (TOPROL-XL) 100 MG 24 hr tablet Take 1 tablet (100 mg total) daily by mouth. Take with or immediately following a meal. 05/09/17  Yes Gladstone Lighter, MD  pantoprazole (PROTONIX) 40 MG tablet Take 1 tablet (40 mg total) daily by mouth. 05/21/17  Yes Dunn, Areta Haber, PA-C  ranolazine (RANEXA) 500 MG 12 hr tablet Take 1 tablet (500 mg total) by mouth 2 (two) times daily. 04/26/17  Yes Gouru, Illene Silver, MD  spironolactone (ALDACTONE) 25 MG tablet Take 0.5 tablets (12.5 mg total) daily by mouth. 05/21/17 08/19/17 Yes Dunn, Areta Haber, PA-C  acetaminophen (TYLENOL) 325 MG tablet Take 2 tablets (650 mg total) by mouth every 6 (six) hours as needed for mild pain (or Fever >/= 101). 04/26/17   Nicholes Mango, MD  blood glucose meter kit and supplies Dispense based on patient and insurance preference. Use 1 time daily as directed. (FOR ICD-9 250.00, 250.01). 04/30/17   [name], NP  Blood Pressure Monitoring (BLOOD PRESSURE CUFF) MISC 1 Units by Does not apply route daily. 04/30/17   [name], NP  meclizine (ANTIVERT) 25 MG tablet Take 25 mg by mouth 3 (three) times daily as needed for dizziness.    [provider]  nitroGLYCERIN (NITROSTAT) 0.4 MG SL tablet Place 1 tablet (0.4 mg total) under the tongue every 5 (five) minutes as needed for chest pain.  04/16/17   Wellington Hampshire, MD     Allergies Metformin and related  Family History  Problem Relation Age of Onset  . CAD Mother   . Heart failure Mother   . Lupus Mother   . CAD Brother   . Heart attack Brother   . Prostate cancer Father   . Diabetes Sister   . Healthy Paternal Grandmother   . Prostate cancer Paternal Grandfather   . Healthy Brother   . Healthy Sister     Social History Social History   Tobacco Use  . Smoking status: Never Smoker  . Smokeless tobacco: Never Used  Substance Use Topics  . Alcohol use: No    Comment: No Alcohol 12 years, but drank 2 "bottles of liquor" each week for several years prior to quitting  . Drug use: No    Review of Systems  Constitutional: No fever/chills Eyes: No visual changes.  ENT: No sore throat. Cardiovascular: As above Respiratory: Denies shortness of breath. Gastrointestinal: No abdominal pain.  No nausea, no vomiting.   Genitourinary: Negative for dysuria. Musculoskeletal: Negative  for back pain. Skin: Negative for rash. Neurological: Negative for headache   ____________________________________________   PHYSICAL EXAM:  VITAL SIGNS: ED Triage Vitals [05/31/17 0801]  Enc Vitals Group     BP 138/71     Pulse Rate 91     Resp 18     Temp      Temp Source Oral     SpO2 98 %     Weight 99.8 kg (220 lb)     Height 1.753 m ('5\' 9"'$ )     Head Circumference      Peak Flow      Pain Score 9     Pain Loc      Pain Edu?      Excl. in [place]?     Constitutional: Alert and oriented. No acute distress. Pleasant and interactive Eyes: Conjunctivae are normal.   Nose: No congestion/rhinnorhea. Mouth/Throat: Mucous membranes are moist.   Neck:  Painless ROM Cardiovascular: Normal rate, irregular. Grossly normal heart sounds.  Good peripheral circulation. Respiratory: Normal respiratory effort.  No retractions. Lungs CTAB. Gastrointestinal: Soft and nontender. No distention.  No CVA tenderness. Genitourinary:  deferred Musculoskeletal: No lower extremity tenderness nor edema.  Warm and well perfused Neurologic:  Normal speech and language. No gross focal neurologic deficits are appreciated.  Skin:  Skin is warm, dry and intact. No rash noted. Psychiatric: Mood and affect are normal. Speech and behavior are normal.  ____________________________________________   LABS (all labs ordered are listed, but only abnormal results are displayed)  Labs Reviewed  BASIC METABOLIC PANEL - Abnormal; Notable for the following components:      Result Value   Potassium 3.4 (*)    Glucose, Bld 167 (*)    All other components within normal limits  CBC - Abnormal; Notable for the following components:   RDW 15.1 (*)    All other components within normal limits  TROPONIN I - Abnormal; Notable for the following components:   Troponin I 0.04 (*)    All other components within normal limits  APTT  PROTIME-INR   ____________________________________________  EKG  ED ECG REPORT I, Lavonia Drafts, the attending physician, personally viewed and interpreted this ECG.  Date: 05/31/2017  Rate: 88 Rhythm: Atrial flutter QRS Axis: normal Intervals:abnormal ST/T Wave abnormalities: normal Narrative Interpretation: no ST elevation  ____________________________________________  RADIOLOGY  Chest x-ray unremarkable ____________________________________________   PROCEDURES  Procedure(s) performed: No  Procedures   Critical Care performed: yes  CRITICAL CARE Performed by: Lavonia Drafts   Total critical care time: 32 minutes  Critical care time was exclusive of separately billable procedures and treating other patients.  Critical care was necessary to treat or prevent imminent or life-threatening deterioration.  Critical care was time spent personally by me on the following activities: development of treatment plan with patient and/or surrogate as well as nursing, discussions with consultants,  evaluation of patient's response to treatment, examination of patient, obtaining history from patient or surrogate, ordering and performing treatments and interventions, ordering and review of laboratory studies, ordering and review of radiographic studies, pulse oximetry and re-evaluation of patient's condition.  ____________________________________________   INITIAL IMPRESSION / ASSESSMENT AND PLAN / ED COURSE  Pertinent labs & imaging results that were available during my care of the patient were reviewed by me and considered in my medical decision making (see chart for details).  Patient presents with chest tightness, recent NSTEMI with drug-eluting stent placement.  No ST elevation on initial EKG.  Will give aspirin and  Plavix, nitroglycerin check labs chest x-ray and monitor  Patient with little improvement in pain. Morphine and Zofran and additional nitro given.  2nd ekg unchanged.   D/w Dr. Fletcher Anon. Given persistent mild pain and recent cath results will start heparin drip.  Admitted to hospitalist service    ____________________________________________   FINAL CLINICAL IMPRESSION(S) / ED DIAGNOSES  Final diagnoses:  Unstable angina ([place])        Note:  This document was prepared using Dragon voice recognition software and may include unintentional dictation errors.    Lavonia Drafts, MD 05/31/17 1022

## 2017-05-31 NOTE — Progress Notes (Signed)
Talked to P.A Ryan Dunn about patient's chest pain of 9/10, gave PRN Morphine, per P.A he will order nitro paste to help patient's chest pain. Re-assessed chest pain after IV morphine and per patient it is 5/10 now. Will wait for P.A's order. No other concern at the moment. RN will continue to monitor.

## 2017-05-31 NOTE — Progress Notes (Signed)
Talked to Dr. Luberta MutterKonidena about patient's BP of 103/60, patient has scheduled Imdur 30 mg and Metoprolol XL 100 mg, he is also to received digoxin 0.0625 mg and Ranexa 1000 mg, order to give digoxin and ranexa for now and to recheck BP in an hour if its SBP is less than 100 then to hold Imdur and Metoprolol. No other concern at the moment. RN will continue to monitor.

## 2017-05-31 NOTE — ED Triage Notes (Signed)
Pt c/o sudden onset chest pain while he was on the way here for cardiac rehab from 2 stent placed 3 weeks ago.denies SOB/N/V/ or diaphoresis..Christopher Cummings

## 2017-05-31 NOTE — Progress Notes (Addendum)
ANTICOAGULATION CONSULT NOTE - Initial Consult  Pharmacy Consult for heparin Indication: chest pain/ACS  Allergies  Allergen Reactions  . Metformin And Related Other (See Comments)    Only the regular Metformin (diarrhea)    Patient Measurements: Height: 5\' 8"  (172.7 cm) Weight: 216 lb 12.8 oz (98.3 kg) IBW/kg (Calculated) : 68.4 Heparin Dosing Weight: 91 kg  Vital Signs: Temp: 97.9 F (36.6 C) (11/29 1155) Temp Source: Oral (11/29 1155) BP: 109/62 (11/29 1713) Pulse Rate: 78 (11/29 1332)  Labs: Recent Labs    05/31/17 0802 05/31/17 1150 05/31/17 1701  HGB 14.6  --   --   HCT 43.9  --   --   PLT 203  --   --   APTT 28  --   --   LABPROT 14.0  --   --   INR 1.09  --   --   HEPARINUNFRC  --   --  0.44  CREATININE 0.88  --   --   TROPONINI 0.04* 0.04*  --     Estimated Creatinine Clearance: 96.4 mL/min (by C-G formula based on SCr of 0.88 mg/dL).   Medical History: Past Medical History:  Diagnosis Date  . CAD (coronary artery disease)    a. s/p CABG (LIMA-LAD), LCx stenting s/p PTCA ISR LCx 01/2017 s/p cutting balloon angioplasty to LCx and Diag 04/2017 s/p PCI/DES to LCx 11/18, Diag not felt to be amenable to further interventions  . Chronic systolic CHF (congestive heart failure) (HCC)    a. TTE 10/18: EF of 35-40% with normal functioning bioprosthetic valve, mild mitral regurgitation, and no pulmonary hypertension  . Colitis   . Diabetes mellitus with complication (HCC)   . Diverticulitis   . Essential hypertension   . H/O aortic valve replacement    a. bioprosthetic arotic valve in [place]  . Hyperlipidemia     Medications:  Patient is not on any anticoagulants at home other than aspirin 81mg  daily. Patient is also on clopidogrel at home and received one dose in the ED.   Assessment: 66 yo male seen in ED for ACS/STEMI. Pharmacy has been consulted to dose heparin   Goal of Therapy:  Heparin level 0.3-0.7 units/ml Monitor platelets by anticoagulation  protocol: Yes   Plan:  HL = 0.44 is therapeutic. Will continue heparin at current infusion rate of 1150 units/hr and order confirmatory HL in 6 hours.  CBC ordered with AM labs tomorrow.  Cindi CarbonMary M Swayne, PharmD Clinical Pharmacist 05/31/2017,5:34 PM     11/29 2330 heparin level 0.51. Continue current regimen. Recheck heparin level and CBC with tomorrow AM labs.  11/30 AM heparin level 0.69. Continue current regimen. Recheck heparin level and CBC with tomorrow AM labs.  Fulton ReekMatt Sundiata Ferrick, PharmD, BCPS  06/01/17 12:49 AM

## 2017-06-01 DIAGNOSIS — R0789 Other chest pain: Secondary | ICD-10-CM | POA: Diagnosis not present

## 2017-06-01 DIAGNOSIS — I200 Unstable angina: Secondary | ICD-10-CM | POA: Diagnosis not present

## 2017-06-01 LAB — GLUCOSE, CAPILLARY
Glucose-Capillary: 130 mg/dL — ABNORMAL HIGH (ref 65–99)
Glucose-Capillary: 174 mg/dL — ABNORMAL HIGH (ref 65–99)

## 2017-06-01 LAB — CBC
HEMATOCRIT: 38.9 % — AB (ref 40.0–52.0)
HEMOGLOBIN: 13.1 g/dL (ref 13.0–18.0)
MCH: 27.3 pg (ref 26.0–34.0)
MCHC: 33.5 g/dL (ref 32.0–36.0)
MCV: 81.4 fL (ref 80.0–100.0)
Platelets: 197 10*3/uL (ref 150–440)
RBC: 4.78 MIL/uL (ref 4.40–5.90)
RDW: 14.8 % — AB (ref 11.5–14.5)
WBC: 9.3 10*3/uL (ref 3.8–10.6)

## 2017-06-01 LAB — BASIC METABOLIC PANEL
ANION GAP: 7 (ref 5–15)
BUN: 22 mg/dL — AB (ref 6–20)
CALCIUM: 8.9 mg/dL (ref 8.9–10.3)
CO2: 27 mmol/L (ref 22–32)
Chloride: 103 mmol/L (ref 101–111)
Creatinine, Ser: 0.85 mg/dL (ref 0.61–1.24)
GFR calc Af Amer: >60 mL/min (ref >60)
GFR calc non Af Amer: >60 mL/min (ref >60)
GLUCOSE: 154 mg/dL — AB (ref 65–99)
POTASSIUM: 3.8 mmol/L (ref 3.5–5.1)
Sodium: 137 mmol/L (ref 135–145)

## 2017-06-01 LAB — TROPONIN I
TROPONIN I: 0.04 ng/mL — AB (ref <0.03)
Troponin I: 0.03 ng/mL (ref <0.03)

## 2017-06-01 LAB — HEPARIN LEVEL (UNFRACTIONATED)
Heparin Unfractionated: 0.51 IU/mL (ref 0.30–0.70)
Heparin Unfractionated: 0.69 IU/mL (ref 0.30–0.70)

## 2017-06-01 MED ORDER — RANOLAZINE ER 1000 MG PO TB12: 1000.0000 mg | ORAL_TABLET | Freq: Two times a day (BID) | ORAL | 2 refills | Status: DC

## 2017-06-01 MED ORDER — DOCUSATE SODIUM 100 MG PO CAPS: 100.0000 mg | ORAL_CAPSULE | Freq: Two times a day (BID) | ORAL | 0 refills | Status: DC

## 2017-06-01 NOTE — Progress Notes (Signed)
Progress Note  Patient Name: Christopher ElseCharles Sprowl Sr. Date of Encounter: 06/01/2017  Primary Cardiologist: M. Kirke CorinArida, MD   Subjective   No chest pain or sob overnight.  Trop trend flat @ 0.04  0.03.  Hasn't ambulated yet this AM. Eager to go home.  Inpatient Medications    Scheduled Meds: . aspirin EC  162 mg Oral Daily  . atorvastatin  80 mg Oral QHS  . clopidogrel  75 mg Oral Daily  . digoxin  0.0625 mg Oral Daily  . docusate sodium  100 mg Oral BID  . insulin aspart  0-15 Units Subcutaneous TID WC  . lisinopril  5 mg Oral QHS  . metoprolol succinate  100 mg Oral Daily  . nitroGLYCERIN  1 inch Topical Q6H  . pantoprazole  40 mg Oral Daily  . ranolazine  1,000 mg Oral BID  . spironolactone  25 mg Oral Daily   Continuous Infusions: . heparin 1,150 Units/hr (05/31/17 1048)   PRN Meds: acetaminophen **OR** acetaminophen, bisacodyl, morphine injection, nitroGLYCERIN, ondansetron **OR** ondansetron (ZOFRAN) IV   Vital Signs    Vitals:   05/31/17 2142 06/01/17 0318 06/01/17 0500 06/01/17 0717  BP: (!) 95/50 (!) 89/58  (!) 100/46  Pulse: 69 91  68  Resp: 18   18  Temp: 98.1 F (36.7 C) 97.8 F (36.6 C)  98.3 F (36.8 C)  TempSrc: Oral Oral    SpO2: 91% 92%  94%  Weight:   218 lb 8 oz (99.1 kg)   Height:        Intake/Output Summary (Last 24 hours) at 06/01/2017 0911 Last data filed at 06/01/2017 0400 Gross per 24 hour  Intake 370.5 ml  Output 250 ml  Net 120.5 ml   Filed Weights   05/31/17 0801 05/31/17 1155 06/01/17 0500  Weight: 220 lb (99.8 kg) 216 lb 12.8 oz (98.3 kg) 218 lb 8 oz (99.1 kg)    Physical Exam   GEN: Well nourished, well developed, in no acute distress.  HEENT: Grossly normal.  Neck: Supple, no JVD, carotid bruits, or masses. Cardiac: RRR, 2/6 syst murmur heard throughout, no rubs, or gallops. No clubbing, cyanosis, edema.  Radials/DP/PT 2+ and equal bilaterally.  Respiratory:  Respirations regular and unlabored, clear to auscultation  bilaterally. GI: Soft, nontender, nondistended, BS + x 4. MS: no deformity or atrophy. Skin: warm and dry, no rash. Neuro:  Strength and sensation are intact. Psych: AAOx3.  Normal affect.  Labs    Chemistry Recent Labs  Lab 05/31/17 0802 06/01/17 0502  NA 137 137  K 3.4* 3.8  CL 102 103  CO2 25 27  GLUCOSE 167* 154*  BUN 14 22*  CREATININE 0.88 0.85  CALCIUM 9.5 8.9  GFRNONAA >60 >60  GFRAA >60 >60  ANIONGAP 10 7     Hematology Recent Labs  Lab 05/31/17 0802 06/01/17 0502  WBC 8.5 9.3  RBC 5.38 4.78  HGB 14.6 13.1  HCT 43.9 38.9*  MCV 81.5 81.4  MCH 27.1 27.3  MCHC 33.3 33.5  RDW 15.1* 14.8*  PLT 203 197    Cardiac Enzymes Recent Labs  Lab 05/31/17 0802 05/31/17 1150 05/31/17 1701 05/31/17 2325  TROPONINI 0.04* 0.04* 0.03* 0.04*   No results for input(s): TROPIPOC in the last 168 hours.     Radiology    Ct Head Wo Contrast  Result Date: 05/31/2017 CLINICAL DATA:  Left-sided facial numbness. EXAM: CT HEAD WITHOUT CONTRAST TECHNIQUE: Contiguous axial images were obtained from the base of  the skull through the vertex without intravenous contrast. COMPARISON:  CT head dated May 07, 2017. FINDINGS: Brain: No evidence of acute infarction, hemorrhage, hydrocephalus, extra-axial collection or mass lesion/mass effect. Stable mild cerebral atrophy. Vascular: No hyperdense vessel or unexpected calcification. Skull: Normal. Negative for fracture or focal lesion. Sinuses/Orbits: No acute finding. Other: None. IMPRESSION: No acute intracranial abnormality. Electronically Signed   By: Obie DredgeWilliam T Derry M.D.   On: 05/31/2017 11:46   Dg Chest Port 1 View  Result Date: 05/31/2017 CLINICAL DATA:  66 year old male with a history of left-sided chest tightness EXAM: PORTABLE CHEST 1 VIEW COMPARISON:  05/07/2017 FINDINGS: Cardiomediastinal silhouette unchanged in size and contour. Surgical changes of median sternotomy and valve repair. No central vascular congestion.  No  interlobular septal thickening. Coarsened interstitial markings bilaterally. No pneumothorax or pleural effusion IMPRESSION: Chronic lung changes without evidence of superimposed acute cardiopulmonary disease. Surgical changes of median sternotomy and valve repair Electronically Signed   By: Gilmer MorJaime  Wagner D.O.   On: 05/31/2017 09:23    Telemetry    Afib with 2:1 to 3:1 atrial flutter - Personally Reviewed  Cardiac Studies   TTE 04/02/2017: Study Conclusions   - Left ventricle: The cavity size was normal. Systolic function was   moderately reduced. The estimated ejection fraction was in the   range of 35% to 40%. Diffuse hypokinesis. The study was not   technically sufficient to allow evaluation of LV diastolic   dysfunction due to atrial fibrillation. - Aortic valve: A bioprosthesis sits well in the aortic position.   Mobility was not restricted. There was no regurgitation. Mean   gradient (S): 14 mm Hg. Peak gradient (S): 23 mm Hg. Valve area   (VTI): 1.17 cm^2. Valve area (Vmax): 1.27 cm^2. Valve area   (Vmean): 1.17 cm^2. - Mitral valve: There was mild regurgitation. - Right ventricle: The cavity size was mildly dilated. Wall   thickness was normal. Systolic function was mildly reduced. - Tricuspid valve: There was trivial regurgitation. - Pulmonary arteries: Systolic pressure was within the normal   range.   Impressions:   - When compared to the prior study from 02/25/2017 LVEF has   decreased from 50-55% to 35-40%.   Gradients across the bioprosthetic valve are normal. No aortic   regurgitation or paravalvular leak.   RV is mildly dilated with mildly decreased systolic function.   Patient Profile   66 y.o. male with a hx of CAD s/p CABG with LIMA-LAD, LCx stenting, and [place]oprosthetic [place], also with history of ICM/chronic systolic CHF, persistent Afib/flutter not on full dose anticoagulation due to recurrent and recent GI bleed,  hypertension, hyperlipidemia, and diabetes who is being seen today for the evaluation of chest pain    Assessment & Plan    1.  Unstable Angina/Elevated troponin:  Pt is most recently s/p NSTEMI earlier this month requriing DES to the proximal LCX.  Residual severe, ISR within Diag stent  not felt to be amenable to additional intervention.  Was doing well, walking up to 3 mi/day and also participating in cardiac rehab but had two severe episodes of chest pain @ rest on 11/29, prompting admission.  Troponin has been mildly elevated with a flat trend 0.04  0.04  0.03  0.04.  No chest pain overnight.  Ranexa titrated to 1000 mg BID.  No BP room to titrate  blocker or add long acting nitrate/ccb.  D/c heparin this AM.  Ambulate.  If no recurrent Ss, likely  OK for discharge later this AM.  2.  Afib/Flutter:  Vacillates between rate-controlled afib and 2:1/3:1 aflutter.  Cont  blocker and digoxin.  No OAC in the setting of DAPT s/p recent stent and recent GIB.  3.  ICM/HFrEF: EF 35-40%.  No dyspnea.  Euvolemic on exam.  Cont  blocker and acei.  No BP room for titration.  Don't think BP would tolerate entresto.  Cont spiro.  4.  Bioprosthetic AoV: stable fxn by recent echo.  5.  HL:  Cont lipitor. LDL 36 10/24.  Nl LFTs in Sept.  Signed, Nicolasa Ducking, NP  06/01/2017, 9:11 AM    For questions or updates, please contact   Please consult [email or web address] for contact info under Cardiology/STEMI.

## 2017-06-01 NOTE — Discharge Summary (Signed)
[facility] NAME: Christopher Cummings    MR#:  [id number]  DATE OF BIRTH:  1951/02/21  DATE OF ADMISSION:  05/31/2017   ADMITTING PHYSICIAN: Epifanio Lesches, MD  DATE OF DISCHARGE: 06/01/2017  1:03 PM  PRIMARY CARE PHYSICIAN: [name], NP   ADMISSION DIAGNOSIS:   Unstable angina ([place]) [I20.0]  DISCHARGE DIAGNOSIS:   Active Problems:   Unstable angina ([place])   Chest tightness   SECONDARY DIAGNOSIS:   Past Medical History:  Diagnosis Date  . CAD (coronary artery disease)    a. s/p CABG (LIMA-LAD), LCx stenting s/p PTCA ISR LCx 01/2017 s/p cutting balloon angioplasty to LCx and Diag 04/2017 s/p PCI/DES to LCx 11/18, Diag not felt to be amenable to further interventions  . Chronic systolic CHF (congestive heart failure) ([place])    a. TTE 10/18: EF of 35-40% with normal functioning bioprosthetic valve, mild mitral regurgitation, and no pulmonary hypertension  . Colitis   . Diabetes mellitus with complication ([place])   . Diverticulitis   . Essential hypertension   . H/O aortic valve replacement    a. bioprosthetic arotic valve in [place]  . Hyperlipidemia     HOSPITAL COURSE:   66 y/o M with PMH of HTN, CAD s/p CABG and stents, bio prosthetic aortic valve replacement, chronic systolic CHF, persistent afib not on anticoagulation due to h/o GI bleed presents secondary to chest pain    #1 unstable angina-has complicated cardiac history, status post bypass surgery and stents put in October 2018 and also Nov 2018 about 3 weeks ago. -Came in with crushing chest pain -Appreciate cardiology consult. Troponins negative and no EKG changes -Chest pain improved after heparin drip and Nitropaste. His Ranexa has been increased to thousand milligrams twice a day now. No further ischemic workup for cardiac catheterization were done. Patient has remained chest pain-free. -Due to his soft blood pressures, no further changes were done  to his mother medications. 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 -Advised to continue cardiac rehabilitation.  #2 chronic systolic CHF-well compensated. Lasix as needed at home  #3 chronic A. fib-on digoxin and Toprol -Not on chronic anticoagulation due to history of life-threatening GI bleed. -Continue aspirin  #4 diabetes mellitus-on metformin. Recently started on Jardiance as outpatient  #5 CAD-stable. Continue cardiac medications  Stable for discharge today after ambulation.   DISCHARGE CONDITIONS:   Guarded  CONSULTS OBTAINED:   Treatment Team:  Wellington Hampshire, MD Bettey Costa, MD  DRUG ALLERGIES:   Allergies  Allergen Reactions  . Metformin And Related Other (See Comments)    Only the regular Metformin (diarrhea)   DISCHARGE MEDICATIONS:   Allergies as of 06/01/2017      Reactions   Metformin And Related Other (See Comments)   Only the regular Metformin (diarrhea)      Medication List    TAKE these medications   acetaminophen 325 MG tablet Commonly known as:  TYLENOL Take 2 tablets (650 mg total) by mouth every 6 (six) hours as needed for mild pain (or Fever >/= 101).   aspirin EC 81 MG tablet Take 2 tablets (162 mg total) daily by mouth.   atorvastatin 80 MG tablet Commonly known as:  LIPITOR Take 1 tablet (80 mg total) by mouth at bedtime.   blood glucose meter kit and supplies Dispense based on patient and insurance preference.  Use 1 time daily as directed. (FOR ICD-9 250.00, 250.01).   Blood Pressure Cuff Misc 1 Units by Does not apply route daily.   clopidogrel 75 MG tablet Commonly known as:  PLAVIX Take 1 tablet (75 mg total) daily by mouth.   digoxin 0.125 MG tablet Commonly known as:  LANOXIN Take 0.5 tablets (0.0625 mg total) by mouth daily.   docusate sodium 100 MG capsule Commonly known as:  COLACE Take 1 capsule (100 mg  total) by mouth 2 (two) times daily.   empagliflozin 25 MG Tabs tablet Commonly known as:  JARDIANCE Take 25 mg daily by mouth.   isosorbide mononitrate 30 MG 24 hr tablet Commonly known as:  IMDUR Take 0.5 tablets (15 mg total) by mouth daily.   lisinopril 5 MG tablet Commonly known as:  PRINIVIL,ZESTRIL Take 1 tablet (5 mg total) at bedtime by mouth.   meclizine 25 MG tablet Commonly known as:  ANTIVERT Take 25 mg by mouth 3 (three) times daily as needed for dizziness.   metFORMIN 500 MG 24 hr tablet Commonly known as:  GLUCOPHAGE-XR Take 1 tablet (500 mg total) daily with breakfast by mouth.   metoprolol succinate 100 MG 24 hr tablet Commonly known as:  TOPROL-XL Take 1 tablet (100 mg total) daily by mouth. Take with or immediately following a meal.   nitroGLYCERIN 0.4 MG SL tablet Commonly known as:  NITROSTAT Place 1 tablet (0.4 mg total) under the tongue every 5 (five) minutes as needed for chest pain.   pantoprazole 40 MG tablet Commonly known as:  PROTONIX Take 1 tablet (40 mg total) daily by mouth.   ranolazine 1000 MG SR tablet Commonly known as:  RANEXA Take 1 tablet (1,000 mg total) by mouth 2 (two) times daily. What changed:    medication strength  how much to take   spironolactone 25 MG tablet Commonly known as:  ALDACTONE Take 0.5 tablets (12.5 mg total) daily by mouth.        DISCHARGE INSTRUCTIONS:   1. PCP follow-up in 1-2 weeks 2. Cardiology follow-up in 2 weeks  DIET:   Cardiac diet  ACTIVITY:   Activity as tolerated  OXYGEN:   Home Oxygen: No.  Oxygen Delivery: room air  DISCHARGE LOCATION:   home   If you experience worsening of your admission symptoms, develop shortness of breath, life threatening emergency, suicidal or homicidal thoughts you must seek medical attention immediately by calling 911 or calling your MD immediately  if symptoms less severe.  You Must read complete instructions/literature along with all the  possible adverse reactions/side effects for all the Medicines you take and that have been prescribed to you. Take any new Medicines after you have completely understood and accpet all the possible adverse reactions/side effects.   Please note  You were cared for by a hospitalist during your hospital stay. If you have any questions about your discharge medications or the care you received while you were in the hospital after you are discharged, you can call the unit and asked to speak with the hospitalist on call if the hospitalist that took care of you is not available. Once you are discharged, your primary care physician will handle any further medical issues. Please note that NO REFILLS for any discharge medications will be authorized once you are discharged, as it is imperative that you return to your primary care physician (or establish a relationship with a primary care physician if you do not have one) for your aftercare needs  so that they can reassess your need for medications and monitor your lab values.    On the day of Discharge:  VITAL SIGNS:   Blood pressure (!) 100/46, pulse 68, temperature 98.3 F (36.8 C), resp. rate 18, height _0  (1.727 m), weight 99.1 kg (218 lb 8 oz), SpO2 94 %.  PHYSICAL EXAMINATION:    GENERAL:66 y.o.-year-old patient lying in the bed with no acute distress.  EYES: Pupils equal, round, reactive to light and accommodation. No scleral icterus. Extraocular muscles intact.  HEENT: Head atraumatic, normocephalic. Oropharynx and nasopharynx clear.  NECK: Supple, no jugular venous distention. No thyroid enlargement, no tenderness.  LUNGS: Normal breath sounds bilaterally, no wheezing, rales,rhonchi or crepitation. No use of accessory muscles of respiration.  CARDIOVASCULAR: S1, S2 normal. No rubs, or gallops. 3/6 systolic murmur present ABDOMEN: Soft, nontender, nondistended. Bowel sounds present. No organomegaly or mass.  EXTREMITIES: No pedal edema,  cyanosis, or clubbing.  NEUROLOGIC: Cranial nerves II through XII are intact. Muscle strength 5/5 in all extremities. Sensation intact. Gait not checked.  PSYCHIATRIC: The patient is alert and oriented x 3.  SKIN: No obvious rash, lesion, or ulcer.    DATA REVIEW:   CBC Recent Labs  Lab 06/01/17 0502  WBC 9.3  HGB 13.1  HCT 38.9*  PLT 197    Chemistries  Recent Labs  Lab 06/01/17 0502  NA 137  K 3.8  CL 103  CO2 27  GLUCOSE 154*  BUN 22*  CREATININE 0.85  CALCIUM 8.9     Microbiology Results  Results for orders placed or performed during the hospital encounter of 03/31/17  MRSA PCR Screening     Status: None   Collection Time: 03/31/17  8:45 PM  Result Value Ref Range Status   MRSA by PCR NEGATIVE NEGATIVE Final    Comment:        The GeneXpert MRSA Assay (FDA approved for NASAL specimens only), is one component of a comprehensive MRSA colonization surveillance program. It is not intended to diagnose MRSA infection nor to guide or monitor treatment for MRSA infections.     RADIOLOGY:  No results found.   Management plans discussed with the patient, family and they are in agreement.  CODE STATUS:     Code Status Orders  (From admission, onward)        Start     Ordered   05/31/17 1051  Full code  Continuous     05/31/17 1053    Code Status History    Date Active Date Inactive Code Status Order ID Comments User Context   05/07/2017 02:49 05/09/2017 18:58 Full Code [id number]  Lance Coon, MD Inpatient   04/24/2017 00:35 04/26/2017 18:44 Full Code [id number]  Lance Coon, MD Inpatient   03/31/2017 20:25 04/03/2017 18:53 Full Code [id number]  Flossie Dibble, MD Inpatient   03/13/2017 01:41 03/14/2017 15:32 Full Code [id number]  Lance Coon, MD Inpatient   02/24/2017 04:20 02/27/2017 14:10 Full Code [id number]  Harrie Foreman, MD Inpatient      TOTAL TIME TAKING CARE OF THIS PATIENT: 38 minutes.    Gladstone Lighter M.D on 06/01/2017 at  2:27 PM  Between 7am to 6pm - Pager - [phone]  After 6pm go to [email or web address] - password [email or web address]  [facility] [place] [facility]  Office  [phone]  CC: Primary care physician; [name], NP   Note: This dictation was prepared with Dragon dictation along with smaller phrase technology. Any transcriptional errors that result from this  process are unintentional.

## 2017-06-01 NOTE — Plan of Care (Signed)
  Progressing Education: Knowledge of General Education information will improve 06/01/2017 1033 - Progressing by Tomie ChinaJackson, Osamu Olguin Cecelie, RN Health Behavior/Discharge Planning: Ability to manage health-related needs will improve 06/01/2017 1033 - Progressing by Tomie ChinaJackson, Chauntelle Azpeitia Cecelie, RN Clinical Measurements: Ability to maintain clinical measurements within normal limits will improve 06/01/2017 1033 - Progressing by Tomie ChinaJackson, Renesmae Donahey Cecelie, RN Will remain free from infection 06/01/2017 1033 - Progressing by Tomie ChinaJackson, Izmael Duross Cecelie, RN Diagnostic test results will improve 06/01/2017 1033 - Progressing by Tomie ChinaJackson, Muadh Creasy Cecelie, RN Respiratory complications will improve 06/01/2017 1033 - Progressing by Tomie ChinaJackson, Shantai Tiedeman Cecelie, RN Cardiovascular complication will be avoided 06/01/2017 1033 - Progressing by Tomie ChinaJackson, Caiden Arteaga Cecelie, RN Activity: Risk for activity intolerance will decrease 06/01/2017 1033 - Progressing by Tomie ChinaJackson, Ivette Castronova Cecelie, RN Nutrition: Adequate nutrition will be maintained 06/01/2017 1033 - Progressing by Tomie ChinaJackson, Myeisha Kruser Cecelie, RN Coping: Level of anxiety will decrease 06/01/2017 1033 - Progressing by Tomie ChinaJackson, Treazure Nery Cecelie, RN Elimination: Will not experience complications related to bowel motility 06/01/2017 1033 - Progressing by Tomie ChinaJackson, Deepak Bless Cecelie, RN Will not experience complications related to urinary retention 06/01/2017 1033 - Progressing by Tomie ChinaJackson, Maleya Leever Cecelie, RN Pain Managment: General experience of comfort will improve 06/01/2017 1033 - Progressing by Tomie ChinaJackson, Karlena Luebke Cecelie, RN Safety: Ability to remain free from injury will improve 06/01/2017 1033 - Progressing by Tomie ChinaJackson, Lyann Hagstrom Cecelie, RN Skin Integrity: Risk for impaired skin integrity will decrease 06/01/2017 1033 - Progressing by Tomie ChinaJackson, Albeiro Trompeter Cecelie, RN Education: Understanding of cardiac disease, CV risk reduction, and recovery process will  improve 06/01/2017 1033 - Progressing by Tomie ChinaJackson, Marilynne Dupuis Cecelie, RN Activity: Ability to tolerate increased activity will improve 06/01/2017 1033 - Progressing by Tomie ChinaJackson, Rutledge Selsor Cecelie, RN Cardiac: Ability to achieve and maintain adequate cardiovascular perfusion will improve 06/01/2017 1033 - Progressing by Tomie ChinaJackson, Keanna Tugwell Cecelie, RN Health Behavior/Discharge Planning: Ability to safely manage health-related needs after discharge will improve 06/01/2017 1033 - Progressing by Tomie ChinaJackson, Beckett Hickmon Cecelie, RN

## 2017-06-01 NOTE — Progress Notes (Signed)
Went over discharge instructions with the patient including medications and follow-up appointments. Discontinue peripheral IV and telemetry monitor. Patient is just waiting for his lunch and will call volunteer once he is done eating.

## 2017-06-02 ENCOUNTER — Telehealth: Payer: Self-pay | Admitting: Internal Medicine

## 2017-06-02 MED ORDER — ISOSORBIDE MONONITRATE ER 30 MG PO TB24: 30.0000 mg | ORAL_TABLET | Freq: Every day | ORAL | 0 refills | Status: DC

## 2017-06-02 NOTE — Telephone Encounter (Signed)
06/02/17 3:46 AM  Patient called. Had one episode of chest pain at rest that resolved with nitro. BP is elevated with SBP in the 160s. I advised him to double his IMDUR to 30 mg daily.  Berdine DanceEric Pauley, MD Cardiology

## 2017-06-05 ENCOUNTER — Telehealth: Payer: Self-pay | Admitting: Cardiovascular Disease

## 2017-06-05 ENCOUNTER — Encounter: Payer: BLUE CROSS/BLUE SHIELD | Attending: Cardiovascular Disease

## 2017-06-05 ENCOUNTER — Other Ambulatory Visit: Payer: Self-pay

## 2017-06-05 DIAGNOSIS — Z7982 Long term (current) use of aspirin: Secondary | ICD-10-CM | POA: Diagnosis not present

## 2017-06-05 DIAGNOSIS — I252 Old myocardial infarction: Secondary | ICD-10-CM | POA: Insufficient documentation

## 2017-06-05 DIAGNOSIS — E119 Type 2 diabetes mellitus without complications: Secondary | ICD-10-CM | POA: Insufficient documentation

## 2017-06-05 DIAGNOSIS — I2510 Atherosclerotic heart disease of native coronary artery without angina pectoris: Secondary | ICD-10-CM | POA: Insufficient documentation

## 2017-06-05 DIAGNOSIS — Z7984 Long term (current) use of oral hypoglycemic drugs: Secondary | ICD-10-CM | POA: Insufficient documentation

## 2017-06-05 DIAGNOSIS — Z79899 Other long term (current) drug therapy: Secondary | ICD-10-CM | POA: Diagnosis not present

## 2017-06-05 DIAGNOSIS — E785 Hyperlipidemia, unspecified: Secondary | ICD-10-CM | POA: Insufficient documentation

## 2017-06-05 DIAGNOSIS — Z7902 Long term (current) use of antithrombotics/antiplatelets: Secondary | ICD-10-CM | POA: Insufficient documentation

## 2017-06-05 DIAGNOSIS — Z955 Presence of coronary angioplasty implant and graft: Secondary | ICD-10-CM | POA: Diagnosis present

## 2017-06-05 DIAGNOSIS — I10 Essential (primary) hypertension: Secondary | ICD-10-CM | POA: Insufficient documentation

## 2017-06-05 DIAGNOSIS — Z9861 Coronary angioplasty status: Secondary | ICD-10-CM

## 2017-06-05 DIAGNOSIS — I214 Non-ST elevation (NSTEMI) myocardial infarction: Secondary | ICD-10-CM

## 2017-06-05 LAB — GLUCOSE, CAPILLARY
GLUCOSE-CAPILLARY: 188 mg/dL — AB (ref 65–99)
GLUCOSE-CAPILLARY: 190 mg/dL — AB (ref 65–99)

## 2017-06-05 MED ORDER — DIGOXIN 125 MCG PO TABS: 0.0625 mg | ORAL_TABLET | Freq: Every day | ORAL | 0 refills | Status: DC

## 2017-06-05 NOTE — Telephone Encounter (Signed)
Ok to refill? He was started on this in hospital.Thanks.

## 2017-06-05 NOTE — Telephone Encounter (Signed)
Pt had appointment with Eula Listenyan Dunn, PA-C since starting digoxin. I have sent in enough refills until his scheduled appointment with Dr. Kirke CorinArida.

## 2017-06-05 NOTE — Progress Notes (Signed)
Daily Session Note  Patient Details  Name: Christopher Neuharth Sr. MRN: [id number] Date of Birth: 1950/12/05 Referring Provider:     Cardiac Rehab from 05/21/2017 in [facility]  Referring Provider  Kathlyn Sacramento MD      Encounter Date: 06/05/2017  Check In: Session Check In - 06/05/17 0843      Check-In   Location  [facility]-Cardiac & Pulmonary Rehab    Staff Present  Nada Maclachlan, BA, ACSM CEP, Exercise Physiologist;Susanne Bice, RN, BSN, CCRP;Jessica Luan Pulling, MA, ACSM RCEP, Exercise Physiologist    Supervising physician immediately available to respond to emergencies  See telemetry face sheet for immediately available ER MD    Medication changes reported      No    Fall or balance concerns reported     No    Warm-up and Cool-down  Performed on first and last piece of equipment    Resistance Training Performed  Yes    VAD Patient?  No      Pain Assessment   Currently in Pain?  No/denies          Social History   Tobacco Use  Smoking Status Never Smoker  Smokeless Tobacco Never Used    Goals Met:  Independence with exercise equipment Exercise tolerated well No report of cardiac concerns or symptoms Strength training completed today  Goals Unmet:  Not Applicable  Comments: Pt able to follow exercise prescription today without complaint.  Will continue to monitor for progression.  Christopher Cummings recieved clearance to return to rehab this morning.  He was able to exercise without any problems.   Dr. Emily Filbert is Medical Director for [facility] and [facility].

## 2017-06-05 NOTE — Telephone Encounter (Signed)
Patient says pharmacy told him he needs a Prior auth to fill digoxin    *STAT* If patient is at the pharmacy, call can be transferred to refill team.   1. Which medications need to be refilled? (please list name of each medication and dose if known)    Digoxin 0.0625 mg po daily   2. Which pharmacy/location (including street and city if local pharmacy) is medication to be sent to?  [facility] [address] [place]    3. Do they need a 30 day or 90 day supply? 90

## 2017-06-06 ENCOUNTER — Emergency Department
Admission: EM | Admit: 2017-06-06 | Discharge: 2017-06-06 | Disposition: A | Discharge status: Home / Self Care | Payer: BLUE CROSS/BLUE SHIELD | Attending: Emergency Medicine | Admitting: Emergency Medicine

## 2017-06-06 ENCOUNTER — Telehealth: Payer: Self-pay | Admitting: Physician Assistant

## 2017-06-06 ENCOUNTER — Encounter: Payer: Self-pay | Admitting: *Deleted

## 2017-06-06 ENCOUNTER — Telehealth: Payer: Self-pay | Admitting: Cardiology

## 2017-06-06 DIAGNOSIS — R197 Diarrhea, unspecified: Secondary | ICD-10-CM | POA: Insufficient documentation

## 2017-06-06 DIAGNOSIS — E119 Type 2 diabetes mellitus without complications: Secondary | ICD-10-CM | POA: Diagnosis not present

## 2017-06-06 DIAGNOSIS — I5022 Chronic systolic (congestive) heart failure: Secondary | ICD-10-CM | POA: Insufficient documentation

## 2017-06-06 DIAGNOSIS — Z79899 Other long term (current) drug therapy: Secondary | ICD-10-CM | POA: Diagnosis not present

## 2017-06-06 DIAGNOSIS — I110 Hypertensive heart disease with heart failure: Secondary | ICD-10-CM | POA: Diagnosis not present

## 2017-06-06 DIAGNOSIS — I214 Non-ST elevation (NSTEMI) myocardial infarction: Secondary | ICD-10-CM

## 2017-06-06 DIAGNOSIS — Z7984 Long term (current) use of oral hypoglycemic drugs: Secondary | ICD-10-CM | POA: Diagnosis not present

## 2017-06-06 DIAGNOSIS — Z7902 Long term (current) use of antithrombotics/antiplatelets: Secondary | ICD-10-CM | POA: Diagnosis not present

## 2017-06-06 DIAGNOSIS — Z7982 Long term (current) use of aspirin: Secondary | ICD-10-CM | POA: Diagnosis not present

## 2017-06-06 DIAGNOSIS — R195 Other fecal abnormalities: Secondary | ICD-10-CM | POA: Diagnosis present

## 2017-06-06 DIAGNOSIS — I252 Old myocardial infarction: Secondary | ICD-10-CM | POA: Insufficient documentation

## 2017-06-06 DIAGNOSIS — I2510 Atherosclerotic heart disease of native coronary artery without angina pectoris: Secondary | ICD-10-CM | POA: Insufficient documentation

## 2017-06-06 LAB — CBC WITH DIFFERENTIAL/PLATELET
Basophils Absolute: 0.1 10*3/uL (ref 0–0.1)
Basophils Relative: 1 %
EOS ABS: 0.2 10*3/uL (ref 0–0.7)
EOS PCT: 2 %
HCT: 39.6 % — ABNORMAL LOW (ref 40.0–52.0)
Hemoglobin: 13.4 g/dL (ref 13.0–18.0)
LYMPHS ABS: 1.5 10*3/uL (ref 1.0–3.6)
Lymphocytes Relative: 18 %
MCH: 27.6 pg (ref 26.0–34.0)
MCHC: 33.8 g/dL (ref 32.0–36.0)
MCV: 81.7 fL (ref 80.0–100.0)
Monocytes Absolute: 0.8 10*3/uL (ref 0.2–1.0)
Monocytes Relative: 10 %
Neutro Abs: 5.9 10*3/uL (ref 1.4–6.5)
Neutrophils Relative %: 69 %
PLATELETS: 212 10*3/uL (ref 150–440)
RBC: 4.85 MIL/uL (ref 4.40–5.90)
RDW: 15.4 % — ABNORMAL HIGH (ref 11.5–14.5)
WBC: 8.6 10*3/uL (ref 3.8–10.6)

## 2017-06-06 LAB — TYPE AND SCREEN
ABO/RH(D): A NEG
ANTIBODY SCREEN: NEGATIVE

## 2017-06-06 LAB — COMPREHENSIVE METABOLIC PANEL
ALK PHOS: 79 U/L (ref 38–126)
ALT: 22 U/L (ref 17–63)
ANION GAP: 10 (ref 5–15)
AST: 26 U/L (ref 15–41)
Albumin: 4 g/dL (ref 3.5–5.0)
BUN: 16 mg/dL (ref 6–20)
CALCIUM: 9.3 mg/dL (ref 8.9–10.3)
CO2: 24 mmol/L (ref 22–32)
Chloride: 104 mmol/L (ref 101–111)
Creatinine, Ser: 0.78 mg/dL (ref 0.61–1.24)
GFR calc non Af Amer: >60 mL/min (ref >60)
Glucose, Bld: 174 mg/dL — ABNORMAL HIGH (ref 65–99)
Potassium: 3.3 mmol/L — ABNORMAL LOW (ref 3.5–5.1)
SODIUM: 138 mmol/L (ref 135–145)
Total Bilirubin: 0.5 mg/dL (ref 0.3–1.2)
Total Protein: 7.2 g/dL (ref 6.5–8.1)

## 2017-06-06 LAB — DIGOXIN LEVEL: Digoxin Level: 0.5 ng/mL — ABNORMAL LOW (ref 0.8–2.0)

## 2017-06-06 LAB — LIPASE, BLOOD: Lipase: 45 U/L (ref 11–51)

## 2017-06-06 NOTE — Telephone Encounter (Signed)
Pt called stating that [facility] informed him insurance denied ranexa. He states he can't afford this and needs something for chest pain. He will look online tonight for a coupon. I asked him to keep his medications the same and to call the office first thing in the morning to speak with a pharmacist for further assistance. He took two nitros last night, which relieved his chest pain. I told him it was ok to use nitro tonight as well. He knows to call the fellow on call if he has to take more than 3 nitro tablets.  Patient expressed understanding of the plan.

## 2017-06-06 NOTE — Progress Notes (Signed)
Cardiac Individual Treatment Plan  Patient Details  Name: Christopher Carrozza Sr. MRN: [id number] Date of Birth: May 12, 1951 Referring Provider:     Cardiac Rehab from 05/21/2017 in [facility] Cardiac and Pulmonary Rehab  Referring Provider  Kathlyn Sacramento MD      Initial Encounter Date:    Cardiac Rehab from 05/21/2017 in [facility] Cardiac and Pulmonary Rehab  Date  05/21/17  Referring Provider  Kathlyn Sacramento MD      Visit Diagnosis: NSTEMI (non-ST elevated myocardial infarction) [facility])  Patient's Home Medications on Admission:  Current Outpatient Medications:  .  acetaminophen (TYLENOL) 325 MG tablet, Take 2 tablets (650 mg total) by mouth every 6 (six) hours as needed for mild pain (or Fever >/= 101)., Disp: , Rfl:  .  aspirin EC 81 MG tablet, Take 2 tablets (162 mg total) daily by mouth., Disp: 30 tablet, Rfl: 11 .  atorvastatin (LIPITOR) 80 MG tablet, Take 1 tablet (80 mg total) by mouth at bedtime., Disp: 90 tablet, Rfl: 3 .  blood glucose meter kit and supplies, Dispense based on patient and insurance preference. Use 1 time daily as directed. (FOR ICD-9 250.00, 250.01)., Disp: 1 each, Rfl: 0 .  Blood Pressure Monitoring (BLOOD PRESSURE CUFF) MISC, 1 Units by Does not apply route daily., Disp: 1 each, Rfl: 0 .  clopidogrel (PLAVIX) 75 MG tablet, Take 1 tablet (75 mg total) daily by mouth., Disp: 90 tablet, Rfl: 3 .  digoxin (LANOXIN) 0.125 MG tablet, Take 0.5 tablets (0.0625 mg total) by mouth daily., Disp: 15 tablet, Rfl: 0 .  docusate sodium (COLACE) 100 MG capsule, Take 1 capsule (100 mg total) by mouth 2 (two) times daily., Disp: 10 capsule, Rfl: 0 .  empagliflozin (JARDIANCE) 25 MG TABS tablet, Take 25 mg daily by mouth., Disp: 30 tablet, Rfl: 5 .  isosorbide mononitrate (IMDUR) 30 MG 24 hr tablet, Take 1 tablet (30 mg total) by mouth daily., Disp: 30 tablet, Rfl: 0 .  lisinopril (PRINIVIL,ZESTRIL) 5 MG tablet, Take 1 tablet (5 mg total) at bedtime by mouth., Disp: 90 tablet, Rfl: 3 .   meclizine (ANTIVERT) 25 MG tablet, Take 25 mg by mouth 3 (three) times daily as needed for dizziness., Disp: , Rfl:  .  metFORMIN (GLUCOPHAGE-XR) 500 MG 24 hr tablet, Take 1 tablet (500 mg total) daily with breakfast by mouth., Disp: 30 tablet, Rfl: 5 .  metoprolol succinate (TOPROL-XL) 100 MG 24 hr tablet, Take 1 tablet (100 mg total) daily by mouth. Take with or immediately following a meal., Disp: 30 tablet, Rfl: 2 .  nitroGLYCERIN (NITROSTAT) 0.4 MG SL tablet, Place 1 tablet (0.4 mg total) under the tongue every 5 (five) minutes as needed for chest pain., Disp: 30 tablet, Rfl: 2 .  pantoprazole (PROTONIX) 40 MG tablet, Take 1 tablet (40 mg total) daily by mouth., Disp: 90 tablet, Rfl: 3 .  ranolazine (RANEXA) 1000 MG SR tablet, Take 1 tablet (1,000 mg total) by mouth 2 (two) times daily., Disp: 60 tablet, Rfl: 2 .  spironolactone (ALDACTONE) 25 MG tablet, Take 0.5 tablets (12.5 mg total) daily by mouth., Disp: 45 tablet, Rfl: 3  Past Medical History: Past Medical History:  Diagnosis Date  . CAD (coronary artery disease)    a. s/p CABG (LIMA-LAD), LCx stenting s/p PTCA ISR LCx 01/2017 s/p cutting balloon angioplasty to LCx and Diag 04/2017 s/p PCI/DES to LCx 11/18, Diag not felt to be amenable to further interventions  . Chronic systolic CHF (congestive heart failure) ([place])  a. TTE 10/18: EF of 35-40% with normal functioning bioprosthetic valve, mild mitral regurgitation, and no pulmonary hypertension  . Colitis   . Diabetes mellitus with complication ([place])   . Diverticulitis   . Essential hypertension   . H/O aortic valve replacement    a. bioprosthetic arotic valve in [place]  . Hyperlipidemia     Tobacco Use: Social History   Tobacco Use  Smoking Status Never Smoker  Smokeless Tobacco Never Used    Labs: Recent Review Flowsheet Data    Labs for ITP Cardiac and Pulmonary Rehab Latest Ref Rng & Units 02/25/2017 03/31/2017 04/25/2017 05/07/2017   Cholestrol 0 - 200 mg/dL 89 108 80 -    LDLCALC 0 - 99 mg/dL 42 43 36 -   HDL >40 mg/dL 35(L) 37(L) 29(L) -   Trlycerides <150 mg/dL 60 138 77 -   Hemoglobin A1c 4.8 - 5.6 % - 8.1(H) - 9.3(H)       Exercise Target Goals:    Exercise Program Goal: Individual exercise prescription set with THRR, safety & activity barriers. Participant demonstrates ability to understand and report RPE using BORG scale, to self-measure pulse accurately, and to acknowledge the importance of the exercise prescription.  Exercise Prescription Goal: Starting with aerobic activity 30 plus minutes a day, 3 days per week for initial exercise prescription. Provide home exercise prescription and guidelines that participant acknowledges understanding prior to discharge.  Activity Barriers & Risk Stratification: Activity Barriers & Cardiac Risk Stratification - 05/21/17 1222      Activity Barriers & Cardiac Risk Stratification   Activity Barriers  Other (comment);Deconditioning;Muscular Weakness;Balance Concerns;Decreased Ventricular Function    Comments  Vertigo    Cardiac Risk Stratification  High       6 Minute Walk: 6 Minute Walk    Row Name 05/21/17 1221         6 Minute Walk   Phase  Initial     Distance  1165 feet     Walk Time  6 minutes     # of Rest Breaks  0     MPH  2.21     METS  2.51     RPE  7     VO2 Peak  8.8     Symptoms  No     Resting HR  80 bpm     Resting BP  132/70     Resting Oxygen Saturation   96 %     Exercise Oxygen Saturation  during 6 min walk  97 %     Max Ex. HR  115 bpm     Max Ex. BP  132/76     2 Minute Post BP  126/60        Oxygen Initial Assessment:   Oxygen Re-Evaluation:   Oxygen Discharge (Final Oxygen Re-Evaluation):   Initial Exercise Prescription: Initial Exercise Prescription - 05/21/17 1200      Date of Initial Exercise RX and Referring Provider   Date  05/21/17    Referring Provider  Kathlyn Sacramento MD      Recumbant Bike   Level  2    RPM  50    Watts  16    Minutes   15    METs  2.5      NuStep   Level  2    SPM  80    Minutes  15    METs  2.5      Track   Laps  32  Minutes  15    METs  2.45      Prescription Details   Frequency (times per week)  3    Duration  Progress to 45 minutes of aerobic exercise without signs/symptoms of physical distress      Intensity   THRR 40-80% of Max Heartrate  110-141    Ratings of Perceived Exertion  11-13    Perceived Dyspnea  0-4      Progression   Progression  Continue to progress workloads to maintain intensity without signs/symptoms of physical distress.      Resistance Training   Training Prescription  Yes    Weight  3 lbs    Reps  10-15       Perform Capillary Blood Glucose checks as needed.  Exercise Prescription Changes: Exercise Prescription Changes    Row Name 05/21/17 1200 05/29/17 1300           Response to Exercise   Blood Pressure (Admit)  132/70  124/72      Blood Pressure (Exercise)  132/76  130/72      Blood Pressure (Exit)  126/60  126/64      Heart Rate (Admit)  80 bpm  101 bpm      Heart Rate (Exercise)  115 bpm  120 bpm      Heart Rate (Exit)  80 bpm  87 bpm      Oxygen Saturation (Admit)  96 %  -      Oxygen Saturation (Exercise)  97 %  -      Rating of Perceived Exertion (Exercise)  7  13      Symptoms  none  none      Comments  walk test results  first full day of exercise      Duration  -  Continue with 45 min of aerobic exercise without signs/symptoms of physical distress.      Intensity  -  THRR unchanged        Progression   Progression  -  Continue to progress workloads to maintain intensity without signs/symptoms of physical distress.      Average METs  -  2.67        Resistance Training   Training Prescription  -  Yes      Weight  -  5 lbs      Reps  -  10-15        Interval Training   Interval Training  -  No        Recumbant Bike   Level  -  2      Watts  -  20      Minutes  -  15      METs  -  2.63        NuStep   Level  -  2       Minutes  -  15      METs  -  2.7        Track   Laps  -  37      Minutes  -  15      METs  -  2.7         Exercise Comments: Exercise Comments    Row Name 05/23/17 0754           Exercise Comments  First full day of exercise!  Patient was oriented to gym and equipment including functions, settings, policies, and procedures.  Patient's individual exercise prescription  and treatment plan were reviewed.  All starting workloads were established based on the results of the 6 minute walk test done at initial orientation visit.  The plan for exercise progression was also introduced and progression will be customized based on patient's performance and goals          Exercise Goals and Review: Exercise Goals    Row Name 05/21/17 1224             Exercise Goals   Increase Physical Activity  Yes       Intervention  Provide advice, education, support and counseling about physical activity/exercise needs.;Develop an individualized exercise prescription for aerobic and resistive training based on initial evaluation findings, risk stratification, comorbidities and participant's personal goals.       Expected Outcomes  Achievement of increased cardiorespiratory fitness and enhanced flexibility, muscular endurance and strength shown through measurements of functional capacity and personal statement of participant.       Increase Strength and Stamina  Yes       Intervention  Provide advice, education, support and counseling about physical activity/exercise needs.;Develop an individualized exercise prescription for aerobic and resistive training based on initial evaluation findings, risk stratification, comorbidities and participant's personal goals.       Expected Outcomes  Achievement of increased cardiorespiratory fitness and enhanced flexibility, muscular endurance and strength shown through measurements of functional capacity and personal statement of participant.       Able to understand and  use rate of perceived exertion (RPE) scale  Yes       Intervention  Provide education and explanation on how to use RPE scale       Expected Outcomes  Short Term: Able to use RPE daily in rehab to express subjective intensity level;Long Term:  Able to use RPE to guide intensity level when exercising independently       Knowledge and understanding of Target Heart Rate Range (THRR)  Yes       Intervention  Provide education and explanation of THRR including how the numbers were predicted and where they are located for reference       Expected Outcomes  Short Term: Able to state/look up THRR;Long Term: Able to use THRR to govern intensity when exercising independently;Short Term: Able to use daily as guideline for intensity in rehab       Able to check pulse independently  Yes       Intervention  Provide education and demonstration on how to check pulse in carotid and radial arteries.;Review the importance of being able to check your own pulse for safety during independent exercise       Expected Outcomes  Short Term: Able to explain why pulse checking is important during independent exercise;Long Term: Able to check pulse independently and accurately       Understanding of Exercise Prescription  Yes       Intervention  Provide education, explanation, and written materials on patient's individual exercise prescription       Expected Outcomes  Short Term: Able to explain program exercise prescription;Long Term: Able to explain home exercise prescription to exercise independently          Exercise Goals Re-Evaluation : Exercise Goals Re-Evaluation    Row Name 05/23/17 0755 05/29/17 1353           Exercise Goal Re-Evaluation   Exercise Goals Review  Understanding of Exercise Prescription;Knowledge and understanding of Target Heart Rate Range (THRR);Able to understand and use rate of perceived  exertion (RPE) scale;Able to check pulse independently  Understanding of Exercise Prescription;Increase  Physical Activity;Increase Strength and Stamina      Comments  Reviewed RPE scale, THR and program prescription with pt today.  Pt voiced understanding and was given a copy of goals to take home.   Harvel completed his first full day of exercise.  He did well.  We will continue to monitor his progression.      Expected Outcomes  Short: Use RPE daily to regulate intensity.  Long: Follow program prescription in THR.  Short: Continue to attend Cardiac Rehab.  Long: Follow program prescription.          Discharge Exercise Prescription (Final Exercise Prescription Changes): Exercise Prescription Changes - 05/29/17 1300      Response to Exercise   Blood Pressure (Admit)  124/72    Blood Pressure (Exercise)  130/72    Blood Pressure (Exit)  126/64    Heart Rate (Admit)  101 bpm    Heart Rate (Exercise)  120 bpm    Heart Rate (Exit)  87 bpm    Rating of Perceived Exertion (Exercise)  13    Symptoms  none    Comments  first full day of exercise    Duration  Continue with 45 min of aerobic exercise without signs/symptoms of physical distress.    Intensity  THRR unchanged      Progression   Progression  Continue to progress workloads to maintain intensity without signs/symptoms of physical distress.    Average METs  2.67      Resistance Training   Training Prescription  Yes    Weight  5 lbs    Reps  10-15      Interval Training   Interval Training  No      Recumbant Bike   Level  2    Watts  20    Minutes  15    METs  2.63      NuStep   Level  2    Minutes  15    METs  2.7      Track   Laps  37    Minutes  15    METs  2.7       Nutrition:  Target Goals: Understanding of nutrition guidelines, daily intake of sodium <1541m, cholesterol <2052m calories 30% from fat and 7% or less from saturated fats, daily to have 5 or more servings of fruits and vegetables.  Biometrics: Pre Biometrics - 05/21/17 1225      Pre Biometrics   Height  5' 9.25" (1.759 m)    Weight  223 lb  6.4 oz (101.3 kg)    Waist Circumference  43.5 inches    Hip Circumference  41 inches    Waist to Hip Ratio  1.06 %    BMI (Calculated)  32.75    Single Leg Stand  1.22 seconds        Nutrition Therapy Plan and Nutrition Goals: Nutrition Therapy & Goals - 05/21/17 1221      Nutrition Therapy   Drug/Food Interactions  Statins/Certain Fruits       Nutrition Discharge: Rate Your Plate Scores: Nutrition Assessments - 05/21/17 1221      MEDFICTS Scores   Pre Score  24       Nutrition Goals Re-Evaluation:   Nutrition Goals Discharge (Final Nutrition Goals Re-Evaluation):   Psychosocial: Target Goals: Acknowledge presence or absence of significant depression and/or stress, maximize coping skills, provide positive support  system. Participant is able to verbalize types and ability to use techniques and skills needed for reducing stress and depression.   Initial Review & Psychosocial Screening: Initial Psych Review & Screening - 05/21/17 1224      Initial Review   Current issues with  Current Stress Concerns    Source of Stress Concerns  Chronic Illness;Transportation    Comments  Sovereign moved from [place] recently to be closer to family. He has recently moved in with his sister and brother in law who have a 35 year old autistic son in a group home.  Jaciel says he misses his 103 year old Grandchildren that he took care of every other weekend for his son and daughter in law. Trinity admits that was his reason to quit drinking 13 years ago. He said he had his first heart attack 13 years ago about a month after he quit drinking a pint of liquor on Friday and a 5th or 2 on Saturday night. Eluzer recently retired from working at [employer] for [employer] years and said he has been in and out of the hospital every since. Semaj reports that he has never smoked. Zephaniah said he used to go to a large mall outside of [place] and sometimes walk 10 miles but is frustrated since he  has still had heart problems including his recenlty NSTEMI/PTCA.  Celester said he doesn't eat like he should and isn't hungry much. He reports his blood sugars in the am are sometimes 250 but sometimes 120. Discussed maybe eating something since he takes long acting Metformin. The short acting Metformin is listed as an allergy since it causes diarrhea so much. Khiree carries his NTG sl and knows to discard it once it is opened per his MD he said he discards it after 6 months. He has a new bottle ready to start next week. Araceli given infor about [employer] and bus line in case his family can't drive him. Camden said he really doesn't want to drive at times since he has vertigo at times      [place]?  Yes      Barriers   Psychosocial barriers to participate in program  The patient should benefit from training in stress management and relaxation.      Screening Interventions   Interventions  Yes;Encouraged to exercise;To provide support and resources with identified psychosocial needs    Expected Outcomes  Short Term goal: Utilizing psychosocial counselor, staff and physician to assist with identification of specific Stressors or current issues interfering with healing process. Setting desired goal for each stressor or current issue identified.;Long Term Goal: Stressors or current issues are controlled or eliminated.;Short Term goal: Identification and review with participant of any Quality of Life or Depression concerns found by scoring the questionnaire.;Long Term goal: The participant improves quality of Life and PHQ9 Scores as seen by post scores and/or verbalization of changes       Quality of Life Scores:  Quality of Life - 05/21/17 1225      Quality of Life Scores   Health/Function Pre  18.43 %    Socioeconomic Pre  25.64 %    Psych/Spiritual Pre  24 %    Family Pre  27 %    GLOBAL Pre  22.18 %       PHQ-9: Recent Review Flowsheet Data    Depression screen  [facility] 2/9 05/21/2017 05/11/2017 04/30/2017   Decreased Interest 0 0 0  Down, Depressed, Hopeless 0 0 0   PHQ - 2 Score 0 0 0   Altered sleeping 1 - -   Tired, decreased energy 1 - -   Change in appetite 1 - -   Feeling bad or failure about yourself  0 - -   Trouble concentrating 0 - -   Moving slowly or fidgety/restless 2 - -   Suicidal thoughts 0 - -   PHQ-9 Score 5 - -   Difficult doing work/chores Somewhat difficult - -     Interpretation of Total Score  Total Score Depression Severity:  1-4 = Minimal depression, 5-9 = Mild depression, 10-14 = Moderate depression, 15-19 = Moderately severe depression, 20-27 = Severe depression   Psychosocial Evaluation and Intervention: Psychosocial Evaluation - 05/23/17 0940      Psychosocial Evaluation & Interventions   Interventions  Encouraged to exercise with the program and follow exercise prescription;Relaxation education;Stress management education    Comments  Counselor met with Mr. Ceesay Nester) today for initial psychosocial evaluation.  He is a 66 year old who reports having "lots of heart issues over the past two years" beginning with a CABGx2 and aorta valve replacement.  He reports having been in the hospital 5x since August with chest pain, etc.  He reports the last stent two weeks ago has made the most change in his increased energy levels and mood.  Ethanael has other health issues with diabetes, colitis, diverticulitis, vertigo and high blood pressure.  He reports sleeping well - sometimes "too much."  He finally got his appetite back recently and he was pleased about that the day before Thanksgiving!  Carmello denies a history of depression or anxiety or any current symptoms and he reports his mood is generally positive most of the time.  Deakon has multiple stressors with moving from [place] to [place] this past August.  His health is also an issue.  But he mostly is missing his twin 50 year old grandchildren in [place] currently.  He has goals to  lose weight; be able to walk up to 10 miles a day - as he reports doing often prior to his cardiact issues; and he would like to feel more energy and less tired.  Staff will follow with Juanda Crumble throughout the course of this program.      Expected Outcomes  Sadik will benefit from consistent exercise to achieve his stated goals.  He will be meeting with the dietician to address his weight loss goals.  The educational and psychoeducational components of this program will be helpful in [place] learning more about his condition and positive ways to cope.  Staff will follow.    Continue Psychosocial Services   Follow up required by staff       Psychosocial Re-Evaluation:   Psychosocial Discharge (Final Psychosocial Re-Evaluation):   Vocational Rehabilitation: Provide vocational rehab assistance to qualifying candidates.   Vocational Rehab Evaluation & Intervention: Vocational Rehab - 05/21/17 1206      Initial Vocational Rehab Evaluation & Intervention   Assessment shows need for Vocational Rehabilitation  No       Education: Education Goals: Education classes will be provided on a variety of topics geared toward better understanding of heart health and risk factor modification. Participant will state understanding/return demonstration of topics presented as noted by education test scores.  Learning Barriers/Preferences: Learning Barriers/Preferences - 05/21/17 1204      Learning Barriers/Preferences   Learning Barriers  Hearing    Learning Preferences  Group  Instruction       Education Topics: General Nutrition Guidelines/Fats and Fiber: -Group instruction provided by verbal, written material, models and posters to present the general guidelines for heart healthy nutrition. Gives an explanation and review of dietary fats and fiber.   Controlling Sodium/Reading Food Labels: -Group verbal and written material supporting the discussion of sodium use in heart healthy nutrition.  Review and explanation with models, verbal and written materials for utilization of the food label.   Exercise Physiology & Risk Factors: - Group verbal and written instruction with models to review the exercise physiology of the cardiovascular system and associated critical values. Details cardiovascular disease risk factors and the goals associated with each risk factor.   Aerobic Exercise & Resistance Training: - Gives group verbal and written discussion on the health impact of inactivity. On the components of aerobic and resistive training programs and the benefits of this training and how to safely progress through these programs.   Flexibility, Balance, General Exercise Guidelines: - Provides group verbal and written instruction on the benefits of flexibility and balance training programs. Provides general exercise guidelines with specific guidelines to those with heart or lung disease. Demonstration and skill practice provided.   Stress Management: - Provides group verbal and written instruction about the health risks of elevated stress, cause of high stress, and healthy ways to reduce stress.   Depression: - Provides group verbal and written instruction on the correlation between heart/lung disease and depressed mood, treatment options, and the stigmas associated with seeking treatment.   Anatomy & Physiology of the Heart: - Group verbal and written instruction and models provide basic cardiac anatomy and physiology, with the coronary electrical and arterial systems. Review of: AMI, Angina, Valve disease, Heart Failure, Cardiac Arrhythmia, Pacemakers, and the ICD.   Cardiac Procedures: - Group verbal and written instruction to review commonly prescribed medications for heart disease. Reviews the medication, class of the drug, and side effects. Includes the steps to properly store meds and maintain the prescription regimen. (beta blockers and nitrates)   Cardiac Medications  I: - Group verbal and written instruction to review commonly prescribed medications for heart disease. Reviews the medication, class of the drug, and side effects. Includes the steps to properly store meds and maintain the prescription regimen.   Cardiac Medications II: -Group verbal and written instruction to review commonly prescribed medications for heart disease. Reviews the medication, class of the drug, and side effects. (all other drug classes)    Go Sex-Intimacy & Heart Disease, Get SMART - Goal Setting: - Group verbal and written instruction through game format to discuss heart disease and the return to sexual intimacy. Provides group verbal and written material to discuss and apply goal setting through the application of the S.M.A.R.T. Method.   Other Matters of the Heart: - Provides group verbal, written materials and models to describe Heart Failure, Angina, Valve Disease, Peripheral Artery Disease, and Diabetes in the realm of heart disease. Includes description of the disease process and treatment options available to the cardiac patient.   Exercise & Equipment Safety: - Individual verbal instruction and demonstration of equipment use and safety with use of the equipment.   Cardiac Rehab from 06/05/2017 in [facility] Cardiac and Pulmonary Rehab  Date  05/21/17  Educator  C.Berwick  Instruction Review Code  3- Needs Reinforcement      Infection Prevention: - Provides verbal and written material to individual with discussion of infection control including proper hand washing and proper equipment cleaning during  exercise session.   Cardiac Rehab from 06/05/2017 in [facility] Cardiac and Pulmonary Rehab  Date  05/21/17  Educator  Loletha Grayer ENterkinRN  Instruction Review Code  1- Verbalizes Understanding      Falls Prevention: - Provides verbal and written material to individual with discussion of falls prevention and safety.   Cardiac Rehab from 06/05/2017 in [facility] Cardiac and Pulmonary  Rehab  Date  05/21/17  Educator  C. ENterkinRN  Instruction Review Code  1- Verbalizes Understanding      Diabetes: - Individual verbal and written instruction to review signs/symptoms of diabetes, desired ranges of glucose level fasting, after meals and with exercise. Acknowledge that pre and post exercise glucose checks will be done for 3 sessions at entry of program.   Cardiac Rehab from 06/05/2017 in [facility] Cardiac and Pulmonary Rehab  Date  05/21/17  Educator  C. Warwick  Instruction Review Code  3- Needs Reinforcement      Other: -Provides group and verbal instruction on various topics (see comments)    Knowledge Questionnaire Score: Knowledge Questionnaire Score - 05/23/17 0821      Knowledge Questionnaire Score   Pre Score  14/28 Reviewed with patient       Core Components/Risk Factors/Patient Goals at Admission: Personal Goals and Risk Factors at Admission - 05/21/17 1224      Core Components/Risk Factors/Patient Goals on Admission    Weight Management  Yes;Obesity;Weight Loss    Intervention  Weight Management: Develop a combined nutrition and exercise program designed to reach desired caloric intake, while maintaining appropriate intake of nutrient and fiber, sodium and fats, and appropriate energy expenditure required for the weight goal.;Weight Management: Provide education and appropriate resources to help participant work on and attain dietary goals.;Weight Management/Obesity: Establish reasonable short term and long term weight goals.    Admit Weight  223 lb 6.4 oz (101.3 kg)    Goal Weight: Short Term  218 lb (98.9 kg)    Goal Weight: Long Term  213 lb (96.6 kg)    Expected Outcomes  Short Term: Continue to assess and modify interventions until short term weight is achieved;Long Term: Adherence to nutrition and physical activity/exercise program aimed toward attainment of established weight goal;Weight Loss: Understanding of general recommendations for a  balanced deficit meal plan, which promotes 1-2 lb weight loss per week and includes a negative energy balance of [phone] kcal/d;Understanding recommendations for meals to include 15-35% energy as protein, 25-35% energy from fat, 35-60% energy from carbohydrates, less than 24m of dietary cholesterol, 20-35 gm of total fiber daily;Understanding of distribution of calorie intake throughout the day with the consumption of 4-5 meals/snacks    Diabetes  Yes    Intervention  Provide education about signs/symptoms and action to take for hypo/hyperglycemia.;Provide education about proper nutrition, including hydration, and aerobic/resistive exercise prescription along with prescribed medications to achieve blood glucose in normal ranges: Fasting glucose 65-99 mg/dL    Expected Outcomes  Short Term: Participant verbalizes understanding of the signs/symptoms and immediate care of hyper/hypoglycemia, proper foot care and importance of medication, aerobic/resistive exercise and nutrition plan for blood glucose control.;Long Term: Attainment of HbA1C < 7%.    Heart Failure  Yes    Intervention  Provide a combined exercise and nutrition program that is supplemented with education, support and counseling about heart failure. Directed toward relieving symptoms such as shortness of breath, decreased exercise tolerance, and extremity edema.    Expected Outcomes  Improve functional capacity of life;Short term: Attendance in program 2-3  days a week with increased exercise capacity. Reported lower sodium intake. Reported increased fruit and vegetable intake. Reports medication compliance.;Short term: Daily weights obtained and reported for increase. Utilizing diuretic protocols set by physician.;Long term: Adoption of self-care skills and reduction of barriers for early signs and symptoms recognition and intervention leading to self-care maintenance.    Hypertension  Yes    Intervention  Provide education on lifestyle  modifcations including regular physical activity/exercise, weight management, moderate sodium restriction and increased consumption of fresh fruit, vegetables, and low fat dairy, alcohol moderation, and smoking cessation.;Monitor prescription use compliance.    Expected Outcomes  Short Term: Continued assessment and intervention until BP is < 140/75m HG in hypertensive participants. < 130/845mHG in hypertensive participants with diabetes, heart failure or chronic kidney disease.;Long Term: Maintenance of blood pressure at goal levels.    Lipids  Yes    Intervention  Provide education and support for participant on nutrition & aerobic/resistive exercise along with prescribed medications to achieve LDL <7031mHDL >63m32m  Expected Outcomes  Short Term: Participant states understanding of desired cholesterol values and is compliant with medications prescribed. Participant is following exercise prescription and nutrition guidelines.;Long Term: Cholesterol controlled with medications as prescribed, with individualized exercise RX and with personalized nutrition plan. Value goals: LDL < 70mg32mL > 40 mg.    Stress  Yes    Intervention  Offer individual and/or small group education and counseling on adjustment to heart disease, stress management and health-related lifestyle change. Teach and support self-help strategies.;Refer participants experiencing significant psychosocial distress to appropriate mental health specialists for further evaluation and treatment. When possible, include family members and significant others in education/counseling sessions.    Expected Outcomes  Short Term: Participant demonstrates changes in health-related behavior, relaxation and other stress management skills, ability to obtain effective social support, and compliance with psychotropic medications if prescribed.;Long Term: Emotional wellbeing is indicated by absence of clinically significant psychosocial distress or social  isolation.       Core Components/Risk Factors/Patient Goals Review:    Core Components/Risk Factors/Patient Goals at Discharge (Final Review):    ITP Comments: ITP Comments    Row Name 05/21/17 1212 06/05/17 1040 06/06/17 0600       ITP Comments  [place]. He has recently moved in with his sister and brother in law who have a 25 ye84 old autistic son in a group home.  CharlJaki he misses his 13 ye28 old Grandchildren that he took care of every other weekend for his son and daughter in law. CharlCleavonts that was his reason to quit drinking 13 years ago. He said he had his first heart attack 13 years ago about a month after he quit drinking a pint of liquor on Friday and a 5th or 2 on Saturday night. CharlBartntly retired from working at a [employer] and said he has been in and out of the hospital every since. CharlMycahrts that he has never smoked. CharlAllon he used [place] but is frustrated since he has still had heart problems including his recenlty NSTEMI/PTCA.  CharlTayshun he doesn't eat like he should and isn't hungry much. He reports his blood sugars in the am are sometimes 250 but sometimes 120. Discussed maybe eating something since he takes long acting Metformin. The short acting Metformin is listed as  an allergy since it causes diarrhea so much. Owyn carries his NTG sl and knows to discard it once it is opened per his MD he said he discards it after 6 months. He has a new bottle ready to start next week. 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 Bentleigh said he really doesn't want to drive at times since he has vertigo at times.   Morrie recieved clearance to return to rehab this morning.  He was able to exercise without any problems.   30 day review. Continue with ITP unless directed changes per  Medical Director review.         Comments:

## 2017-06-06 NOTE — ED Provider Notes (Signed)
[facility] Emergency Department Provider Note  ____________________________________________   First MD Initiated Contact with Patient 06/06/17 2244     (approximate)  I have reviewed the triage vital signs and the nursing notes.   HISTORY  Chief Complaint Blood In Stools (dark stools)   HPI Christopher Mcbryar Sr. is a 66 y.o. male with a history of colitis, diverticulitis as well as diabetes and coronary artery disease on Plavix who is presenting to the emergency department today with one episode of what he describes as "coffee ground stool."  He says that he has had 7 episodes of diarrhea today.  No recent antibiotics but multiple recent hospitalizations over the past several weeks.  Says that he has some diffuse abdominal cramping which is intermittent.  No pain at this time.  No nausea or vomiting.  Just discharged recently for congestive heart failure.  Says that he was discharged on Ranexa and has been experiencing some intermittent episodes of chest pain that are relieved by nitroglycerin.  However, he has been unable to afford the Ranexa and so has not been taking it.  He does take Imdur, 30 mg daily.  Past Medical History:  Diagnosis Date  . CAD (coronary artery disease)    a. s/p CABG (LIMA-LAD), LCx stenting s/p PTCA ISR LCx 01/2017 s/p cutting balloon angioplasty to LCx and Diag 04/2017 s/p PCI/DES to LCx 11/18, Diag not felt to be amenable to further interventions  . Chronic systolic CHF (congestive heart failure) ([place])    a. TTE 10/18: EF of 35-40% with normal functioning bioprosthetic valve, mild mitral regurgitation, and no pulmonary hypertension  . Colitis   . Diabetes mellitus with complication ([place])   . Diverticulitis   . Essential hypertension   . H/O aortic valve replacement    a. bioprosthetic arotic valve in [place]  . Hyperlipidemia     Patient Active Problem List   Diagnosis Date Noted  . Chest tightness 05/31/2017  . Gastroesophageal  reflux disease 05/29/2017  . Chronic systolic heart failure ([place]) 05/12/2017  . NSTEMI (non-ST elevated myocardial infarction) ([place]) 05/07/2017  . H/O prosthetic aortic valve replacement 04/01/2017  . Unstable angina ([place]) 03/31/2017  . HTN (hypertension) 03/12/2017  . Diabetes ([place]) 03/12/2017  . CAD (coronary artery disease) 03/12/2017  . History of non-ST elevation myocardial infarction (NSTEMI) 02/24/2017    Past Surgical History:  Procedure Laterality Date  . AORTIC VALVE REPLACEMENT    . CORONARY ARTERY BYPASS GRAFT    . CORONARY BALLOON ANGIOPLASTY N/A 04/02/2017   Procedure: CORONARY BALLOON ANGIOPLASTY;  Surgeon: Troy Sine, MD;  Location: [place] [facility];  Service: Cardiovascular;  Laterality: N/A;  . CORONARY STENT INTERVENTION N/A 02/26/2017   Procedure: CORONARY STENT INTERVENTION;  Surgeon: Wellington Hampshire, MD;  Location: [place] [facility];  Service: Cardiovascular;  Laterality: N/A;  . CORONARY STENT INTERVENTION N/A 05/08/2017   Procedure: CORONARY STENT INTERVENTION;  Surgeon: Nelva Bush, MD;  Location: [place] [facility];  Service: Cardiovascular;  Laterality: N/A;  . CORONARY/GRAFT ANGIOGRAPHY N/A 02/26/2017   Procedure: CORONARY/GRAFT ANGIOGRAPHY;  Surgeon: Wellington Hampshire, MD;  Location: [place] [facility];  Service: Cardiovascular;  Laterality: N/A;  . CORONARY/GRAFT ANGIOGRAPHY N/A 04/02/2017   Procedure: CORONARY/GRAFT ANGIOGRAPHY;  Surgeon: Troy Sine, MD;  Location: [place] [facility];  Service: Cardiovascular;  Laterality: N/A;  . CORONARY/GRAFT ANGIOGRAPHY N/A 05/08/2017   Procedure: CORONARY/GRAFT ANGIOGRAPHY;  Surgeon: Nelva Bush, MD;  Location: [place] [facility];  Service:  Cardiovascular;  Laterality: N/A;  . JOINT REPLACEMENT Left    KNEE  . KNEE ARTHROPLASTY  2000    Prior to Admission medications   Medication Sig Start Date End Date Taking? Authorizing Provider  acetaminophen (TYLENOL) 325 MG tablet Take 2 tablets  (650 mg total) by mouth every 6 (six) hours as needed for mild pain (or Fever >/= 101). 04/26/17  Yes Gouru, Illene Silver, MD  aspirin EC 81 MG tablet Take 2 tablets (162 mg total) daily by mouth. 05/15/17  Yes [name], NP  atorvastatin (LIPITOR) 80 MG tablet Take 1 tablet (80 mg total) by mouth at bedtime. 04/30/17  Yes [name], NP  clopidogrel (PLAVIX) 75 MG tablet Take 1 tablet (75 mg total) daily by mouth. 05/21/17  Yes Dunn, Areta Haber, PA-C  digoxin (LANOXIN) 0.125 MG tablet Take 0.5 tablets (0.0625 mg total) by mouth daily. 06/05/17 06/05/18 Yes Dunn, Areta Haber, PA-C  docusate sodium (COLACE) 100 MG capsule Take 1 capsule (100 mg total) by mouth 2 (two) times daily. 06/01/17  Yes Gladstone Lighter, MD  empagliflozin (JARDIANCE) 25 MG TABS tablet Take 25 mg daily by mouth. 05/15/17  Yes [name], NP  isosorbide mononitrate (IMDUR) 30 MG 24 hr tablet Take 1 tablet (30 mg total) by mouth daily. 06/02/17  Yes Braxton Feathers, MD  lisinopril (PRINIVIL,ZESTRIL) 5 MG tablet Take 1 tablet (5 mg total) at bedtime by mouth. 05/21/17  Yes Dunn, Areta Haber, PA-C  meclizine (ANTIVERT) 25 MG tablet Take 25 mg by mouth 3 (three) times daily as needed for dizziness.   Yes [provider]  metFORMIN (GLUCOPHAGE-XR) 500 MG 24 hr tablet Take 1 tablet (500 mg total) daily with breakfast by mouth. 05/15/17 05/15/18 Yes [name], NP  metoprolol succinate (TOPROL-XL) 100 MG 24 hr tablet Take 1 tablet (100 mg total) daily by mouth. Take with or immediately following a meal. 05/09/17  Yes Gladstone Lighter, MD  nitroGLYCERIN (NITROSTAT) 0.4 MG SL tablet Place 1 tablet (0.4 mg total) under the tongue every 5 (five) minutes as needed for chest pain. 04/16/17  Yes Wellington Hampshire, MD  pantoprazole (PROTONIX) 40 MG tablet Take 1 tablet (40 mg total) daily by mouth. 05/21/17  Yes Dunn, Areta Haber, PA-C  spironolactone (ALDACTONE) 25 MG tablet Take 0.5 tablets (12.5 mg total) daily by  mouth. 05/21/17 08/19/17 Yes Dunn, Areta Haber, PA-C  blood glucose meter kit and supplies Dispense based on patient and insurance preference. Use 1 time daily as directed. (FOR ICD-9 250.00, 250.01). 04/30/17   [name], NP  Blood Pressure Monitoring (BLOOD PRESSURE CUFF) MISC 1 Units by Does not apply route daily. 04/30/17   [name], NP  ranolazine (RANEXA) 1000 MG SR tablet Take 1 tablet (1,000 mg total) by mouth 2 (two) times daily. Patient not taking: Reported on 06/06/2017 06/01/17   Gladstone Lighter, MD    Allergies Metformin and related  Family History  Problem Relation Age of Onset  . CAD Mother   . Heart failure Mother   . Lupus Mother   . CAD Brother   . Heart attack Brother   . Prostate cancer Father   . Diabetes Sister   . Healthy Paternal Grandmother   . Prostate cancer Paternal Grandfather   . Healthy Brother   . Healthy Sister     Social History Social History   Tobacco Use  . Smoking status: Never Smoker  . Smokeless tobacco: Never Used  Substance Use  Topics  . Alcohol use: No    Comment: No Alcohol 12 years, but drank 2 "bottles of liquor" each week for several years prior to quitting  . Drug use: No    Review of Systems  Constitutional: No fever/chills Eyes: No visual changes. ENT: No sore throat. Cardiovascular: Denies chest pain. Respiratory: Denies shortness of breath. Gastrointestinal:   No nausea, no vomiting.  No constipation. Genitourinary: Negative for dysuria. Musculoskeletal: Negative for back pain. Skin: Negative for rash. Neurological: Negative for headaches, focal weakness or numbness.   ____________________________________________   PHYSICAL EXAM:  VITAL SIGNS: ED Triage Vitals  Enc Vitals Group     BP 06/06/17 2200 132/76     Pulse Rate 06/06/17 2200 94     Resp 06/06/17 2200 17     Temp 06/06/17 2200 (!) 97.5 F (36.4 C)     Temp Source 06/06/17 2200 Oral     SpO2 06/06/17 2200 98 %     Weight  06/06/17 2201 219 lb (99.3 kg)     Height 06/06/17 2201 5' 9"  (1.753 m)     Head Circumference --      Peak Flow --      Pain Score 06/06/17 2200 0     Pain Loc --      Pain Edu? --      Excl. in [place]? --     Constitutional: Alert and oriented. Well appearing and in no acute distress. Eyes: Conjunctivae are normal.  Head: Atraumatic. Nose: No congestion/rhinnorhea. Mouth/Throat: Mucous membranes are moist.  Neck: No stridor.   Cardiovascular: Normal rate, regular rhythm. Grossly normal heart sounds.  Good peripheral circulation. Respiratory: Normal respiratory effort.  No retractions. Lungs CTAB. Gastrointestinal: Soft and nontender. No distention.  Rectal exam with grossly brown stool which is heme-negative. Musculoskeletal: No lower extremity tenderness nor edema.  No joint effusions. Neurologic:  Normal speech and language. No gross focal neurologic deficits are appreciated. Skin:  Skin is warm, dry and intact. No rash noted. Psychiatric: Mood and affect are normal. Speech and behavior are normal.  ____________________________________________   LABS (all labs ordered are listed, but only abnormal results are displayed)  Labs Reviewed  CBC WITH DIFFERENTIAL/PLATELET - Abnormal; Notable for the following components:      Result Value   HCT 39.6 (*)    RDW 15.4 (*)    All other components within normal limits  GASTROINTESTINAL PANEL BY PCR, STOOL (REPLACES STOOL CULTURE)  C DIFFICILE QUICK SCREEN W PCR REFLEX  COMPREHENSIVE METABOLIC PANEL  LIPASE, BLOOD  DIGOXIN LEVEL  TYPE AND SCREEN   ____________________________________________  EKG   ____________________________________________  RADIOLOGY   ____________________________________________   PROCEDURES  Procedure(s) performed:   Procedures  Critical Care performed:   ____________________________________________   INITIAL IMPRESSION / ASSESSMENT AND PLAN / ED COURSE  Pertinent labs & imaging results  that were available during my care of the patient were reviewed by me and considered in my medical decision making (see chart for details).  DDX: Diverticular bleeding, hemorrhoid bleed, colitis, C. difficile, viral or bacterial diarrhea, angina  As part of my medical decision making, I reviewed the following data within the [place] chart reviewed   ----------------------------------------- 11:26 PM on 06/06/2017 -----------------------------------------  Patient without any diarrhea since 9 PM tonight.  Patient will be discharged at this point.  Discussed the case with Dr. Aundra Dubin given the patient unable to afford his Ranexa.  We will increase his Imdur to 60 mg daily.  The patient is understanding of this plan and willing to comply.  He will be discharged home at this time.  He will follow-up with Dr. Fletcher Anon.       ____________________________________________   FINAL CLINICAL IMPRESSION(S) / ED DIAGNOSES  Diarrhea.     NEW MEDICATIONS STARTED DURING THIS VISIT:  This SmartLink is deprecated. Use AVSMEDLIST instead to display the medication list for a patient.   Note:  This document was prepared using Dragon voice recognition software and may include unintentional dictation errors.     Orbie Pyo, MD 06/06/17 [phone]

## 2017-06-06 NOTE — ED Triage Notes (Signed)
Pt arrived via EMS from home with complaints of dark, coffee ground color stools which he noticed at 9:00pm tonight. Pt has a Hx of polyps and diverticulitis. Pt stated that he had a similar episode a year ago. VS WNL per EMS. Pt has a 20 gauge left forearm per EMS. Pt states no dizziness or complaints of pain.

## 2017-06-06 NOTE — Telephone Encounter (Signed)
Received page from patient.  He states that he has had several bowel movements today, and most recently he noticed blood on the toilet paper and a significant dark discoloration to his stool.  Denies chest pain, shortness of breath, lightheadedness.  I advised that he presented to the emergency department for evaluation for serial hematocrits and consideration of a GI workup for this bleeding.  Patient agreed with the plan will present to the emergency department.

## 2017-06-07 DIAGNOSIS — Z955 Presence of coronary angioplasty implant and graft: Secondary | ICD-10-CM | POA: Diagnosis not present

## 2017-06-07 DIAGNOSIS — Z9861 Coronary angioplasty status: Secondary | ICD-10-CM

## 2017-06-07 DIAGNOSIS — I214 Non-ST elevation (NSTEMI) myocardial infarction: Secondary | ICD-10-CM

## 2017-06-07 LAB — GLUCOSE, CAPILLARY
GLUCOSE-CAPILLARY: 174 mg/dL — AB (ref 65–99)
Glucose-Capillary: 176 mg/dL — ABNORMAL HIGH (ref 65–99)

## 2017-06-07 NOTE — Progress Notes (Signed)
Daily Session Note  Patient Details  Name: Safwan Tomei Sr. MRN: [id number] Date of Birth: Feb 18, 1951 Referring Provider:     Cardiac Rehab from 05/21/2017 in [facility] Cardiac and Pulmonary Rehab  Referring Provider  Kathlyn Sacramento MD      Encounter Date: 06/07/2017  Check In: Session Check In - 06/07/17 0912      Check-In   Location  [facility]-Cardiac & Pulmonary Rehab    Staff Present  Nada Maclachlan, BA, ACSM CEP, Exercise Physiologist;Carroll Enterkin, RN, Levie Heritage, MA, ACSM RCEP, Exercise Physiologist    Supervising physician immediately available to respond to emergencies  See telemetry face sheet for immediately available ER MD    Medication changes reported      No    Fall or balance concerns reported     No    Warm-up and Cool-down  Performed on first and last piece of equipment    Resistance Training Performed  Yes    VAD Patient?  No      Pain Assessment   Currently in Pain?  No/denies        Exercise Prescription Changes - 06/07/17 0900      Home Exercise Plan   Plans to continue exercise at  Home (comment) walk or at the mall    Frequency  Add 1 additional day to program exercise sessions.    Initial Home Exercises Provided  06/07/17       Social History   Tobacco Use  Smoking Status Never Smoker  Smokeless Tobacco Never Used    Goals Met:  Independence with exercise equipment Exercise tolerated well No report of cardiac concerns or symptoms Strength training completed today  Goals Unmet:  Not Applicable  Comments:Reviewed home exercise with pt today.  Pt plans to walk for exercise.  Reviewed THR, pulse, RPE, sign and symptoms, NTG use, and when to call 911 or MD.  Also discussed weather considerations and indoor options.  Pt voiced understanding.    Dr. Emily Filbert is Medical Director for [facility].

## 2017-06-08 ENCOUNTER — Telehealth: Payer: Self-pay | Admitting: Cardiovascular Disease

## 2017-06-08 NOTE — Telephone Encounter (Signed)
Please advise if patient should be switched to another medication due to cost.

## 2017-06-08 NOTE — Telephone Encounter (Signed)
Pt calling stating he can't afford the digoxin   He states he is not able to get it even with coupon  That even with it, it will still cost him around $200   Would like some advise on this  Please call back

## 2017-06-08 NOTE — Telephone Encounter (Signed)
Increase digoxin to 0.125 mg once daily.  This dose is generic and should be cheap.

## 2017-06-08 NOTE — Telephone Encounter (Signed)
[facility] 0.0625mg  qd. He is scheduled for f/u on 12/11. Pt reports he is unable to afford medication as it is over $200/month even with a coupon. We reviewed medications. Pt states he has increased imdur to 90mg  once daily and is not taking digoxin or ranexa.  Last dose of digoxin was 5 days ago. He c/o chest pressure two nights ago. He took two nitro and sx resolved. States he will need more nitro refills. Pt is scheduled w/Dr. Kirke CorinArida 12/11. Will make him aware of medication concerns for discussion at [facility].

## 2017-06-11 ENCOUNTER — Telehealth: Payer: Self-pay | Admitting: Cardiovascular Disease

## 2017-06-11 ENCOUNTER — Telehealth: Payer: Self-pay | Admitting: Student

## 2017-06-11 MED ORDER — ISOSORBIDE MONONITRATE ER 30 MG PO TB24: 30.0000 mg | ORAL_TABLET | Freq: Two times a day (BID) | ORAL | 3 refills | Status: DC

## 2017-06-11 NOTE — Telephone Encounter (Signed)
Pt c/o of Chest Pain: STAT if CP now or developed within 24 hours  1. Are you having CP right now? no  2. Are you experiencing any other symptoms (ex. SOB, nausea, vomiting, sweating)? No, just chest pain  3. How long have you been experiencing CP? For the past 4 nights, only at night  4. Is your CP continuous or coming and going? Comes and goes  5. Have you taken Nitroglycerin? Last night took 4.   Pt has appointment on 06/13/2017 with Ward Givenshris Berge, NP

## 2017-06-11 NOTE — Telephone Encounter (Signed)
    The patient called the on-call provider line reporting intermittent episodes of nocturnal chest pain over the past several nights. Relieved with SL NTG and he is able to go back to sleep but the pain represents several hours later. Denies any active pain at this time. No symptoms during the day or with exertion. No association with food. Remains on Protonix for GERD.  He has an appointment with Ward Givenshris Berge, NP scheduled on 06/13/2017. In the interim, I recommended he increase his Imdur from 30mg  daily to 30mg  BID. We reviewed the use of SL NTG and he is aware if pain persists after the 3rd dose, then he should call 911.   He voiced understanding of this and was appreciative of the call.   Signed, Ellsworth LennoxBrittany M Zen Cedillos, PA-C 06/11/2017, 5:19 PM

## 2017-06-12 ENCOUNTER — Ambulatory Visit: Payer: Medicaid Other | Admitting: Cardiovascular Disease

## 2017-06-13 ENCOUNTER — Ambulatory Visit (INDEPENDENT_AMBULATORY_CARE_PROVIDER_SITE_OTHER): Payer: BLUE CROSS/BLUE SHIELD | Admitting: Nurse Practitioner

## 2017-06-13 ENCOUNTER — Ambulatory Visit: Payer: BLUE CROSS/BLUE SHIELD | Admitting: Nurse Practitioner

## 2017-06-13 ENCOUNTER — Encounter: Payer: Self-pay | Admitting: Nurse Practitioner

## 2017-06-13 VITALS — BP 102/52 | HR 89 | Ht 69.0 in | Wt 224.8 lb

## 2017-06-13 DIAGNOSIS — E785 Hyperlipidemia, unspecified: Secondary | ICD-10-CM

## 2017-06-13 DIAGNOSIS — I255 Ischemic cardiomyopathy: Secondary | ICD-10-CM

## 2017-06-13 DIAGNOSIS — I5022 Chronic systolic (congestive) heart failure: Secondary | ICD-10-CM

## 2017-06-13 DIAGNOSIS — I25118 Atherosclerotic heart disease of native coronary artery with other forms of angina pectoris: Secondary | ICD-10-CM

## 2017-06-13 DIAGNOSIS — E119 Type 2 diabetes mellitus without complications: Secondary | ICD-10-CM

## 2017-06-13 DIAGNOSIS — I482 Chronic atrial fibrillation: Secondary | ICD-10-CM | POA: Diagnosis not present

## 2017-06-13 DIAGNOSIS — E876 Hypokalemia: Secondary | ICD-10-CM | POA: Diagnosis not present

## 2017-06-13 DIAGNOSIS — I25119 Atherosclerotic heart disease of native coronary artery with unspecified angina pectoris: Secondary | ICD-10-CM | POA: Diagnosis not present

## 2017-06-13 DIAGNOSIS — I4821 Permanent atrial fibrillation: Secondary | ICD-10-CM

## 2017-06-13 DIAGNOSIS — I10 Essential (primary) hypertension: Secondary | ICD-10-CM | POA: Diagnosis not present

## 2017-06-13 MED ORDER — RANOLAZINE ER 1000 MG PO TB12: 1000.0000 mg | ORAL_TABLET | Freq: Two times a day (BID) | ORAL | 0 refills | Status: DC

## 2017-06-13 MED ORDER — ASPIRIN EC 81 MG PO TBEC: 81.0000 mg | DELAYED_RELEASE_TABLET | Freq: Every day | ORAL | Status: AC

## 2017-06-13 MED ORDER — ISOSORBIDE MONONITRATE ER 60 MG PO TB24: 60.0000 mg | ORAL_TABLET | Freq: Every day | ORAL | 6 refills | Status: DC

## 2017-06-13 NOTE — Telephone Encounter (Signed)
Pt had OV today with Ward Givenshris Berge, NP. Pt did not mention affordability of digoxin. I called pt's pharmacy to determine cost. Pharmacist states she will run it through insurance but the cost will not be available until tomorrow.  Ward Givenshris Berge, NP, aware.

## 2017-06-13 NOTE — Progress Notes (Addendum)
Office Visit    Patient Name: Christopher Cummings. Date of Encounter: 06/13/2017  Primary Care Provider:  [name], NP Primary Cardiologist:  Kathlyn Sacramento, MD  Chief Complaint    66 y/o ? with a history of CAD status post prior two-vessel CABG with subsequent stenting and multiple percutaneous interventions, aortic stenosis status post bioprosthetic AVR, hypertension, hyperlipidemia, ischemic cardiomyopathy with an EF of 35-40%, HFrEF, diabetes, and rectal bleeding, who presents for follow-up related to recurrent chest pain.  Past Medical History    Past Medical History:  Diagnosis Date  . Aortic stenosis    a. 07/2015 s/p bioprosthetic AVR (#23 [employer]) - [place]; b. 04/2017 Echo: nl fxn'ing AoV.  Marland Kitchen CAD (coronary artery disease)    a. 2009 PCI->D1; b. 07/2015 CABG x 2 reported (LIMA-LAD noted on cath 01/2017) [facility] - [place]; c. 2017/2018 Prox/Dist LCX & D1 stenting; d. 01/2017 PCI: ISR prox/dist LCX stents (PTCA), ISR D1 (med Rx); e. 04/2017 PCI: LCX 90p ISR (CBA), 70d ISR (CBA), D1 95 ISR (CBA); f. 05/2017 PCI: LM nl, LAD 70p/m, 80d, D1 99 ISR, LCX 70p (2.25x22 Onyx DES), 30d ISR, OM3 90, RCA 10p ISR, 24m L->LAD ok.  . Chronic systolic CHF (congestive heart failure) ([place]    a. 04/2017 Echo: EF 35-40% with nl functioning bioprosthetic AoVe, mild MR, nl PA.  . Colitis   . Diabetes mellitus with complication ([place]   . Diverticulitis   . Essential hypertension   . Hyperlipidemia   . Ischemic cardiomyopathy    a. 04/2017 Echo: EF 35-40%.  . Rectal bleeding    a. 03/2017 -> f/u @ [facility].   Past Surgical History:  Procedure Laterality Date  . AORTIC VALVE REPLACEMENT    . CORONARY ARTERY BYPASS GRAFT    . CORONARY BALLOON ANGIOPLASTY N/A 04/02/2017   Procedure: CORONARY BALLOON ANGIOPLASTY;  Surgeon: KTroy Sine MD;  [place];  Service: Cardiovascular;  Laterality: N/A;  . CORONARY STENT INTERVENTION N/A 02/26/2017   Procedure:  CORONARY STENT INTERVENTION;  Surgeon: AWellington Hampshire MD;  [place];  Service: Cardiovascular;  Laterality: N/A;  . CORONARY STENT INTERVENTION N/A 05/08/2017   Procedure: CORONARY STENT INTERVENTION;  Surgeon: ENelva Bush MD;  [place];  Service: Cardiovascular;  Laterality: N/A;  . CORONARY/GRAFT ANGIOGRAPHY N/A 02/26/2017   Procedure: CORONARY/GRAFT ANGIOGRAPHY;  Surgeon: AWellington Hampshire MD;  [place];  Service: Cardiovascular;  Laterality: N/A;  . CORONARY/GRAFT ANGIOGRAPHY N/A 04/02/2017   Procedure: CORONARY/GRAFT ANGIOGRAPHY;  Surgeon: KTroy Sine MD;  [place];  Service: Cardiovascular;  Laterality: N/A;  . CORONARY/GRAFT ANGIOGRAPHY N/A 05/08/2017   Procedure: CORONARY/GRAFT ANGIOGRAPHY;  Surgeon: ENelva Bush MD;  [place] [facility];  Service: Cardiovascular;  Laterality: N/A;  . JOINT REPLACEMENT Left    KNEE  . KNEE ARTHROPLASTY  2000    Allergies  Allergies  Allergen Reactions  . Metformin And Related Other (See Comments)    Only the regular Metformin (diarrhea)    History of Present Illness    66year old ? with the above complex past medical history including coronary artery disease status post stenting of the diagonal in 2009 [place].  Following that, he required stents to the right coronary artery and the proximal and distal left circumflex.  He is unaware of the details as to why he required  stenting following CABG but it appears most likely that he had a vein graft that went down as catheterizations at [facility] have only ever shown a LIMA to the LAD without vein graft markers, though patient does have a scar from where a vein graft was taken from right leg.  Other history includes aortic stenosis status post bioprosthetic aortic valve replacement at the time of his CABG in January 2017, hypertension,  hyperlipidemia, diabetes, and intermittent rectal bleeding.  This year, patient has had multiple hospitalizations related the chest pain with finding of in-stent restenosis of the diagonal and proximal, and distal left circumflex in August of this year with PTCA of the proximal and distal circumflex.  An initial attempt was made at medical management of the diagonal.  In October he required cutting balloon angioplasty within the diagonal stent as well as proximal and distal left circumflex stents.  In late October he underwent stress testing due to recurrent chest pain which was nonischemic.  In November, he was admitted with recurrent chest pain and non-STEMI and was again found to have severe narrowing/in-stent restenosis in the distal circumflex and diagonal.  The distal circumflex was treated with drug-eluting stent.  Medical therapy was recommended for the diagonal.    Patient was most recently admitted in late November with recurrent chest pain and a troponin of 0.04 with a flat trend.  Medical therapy was recommended.  Isosorbide was titrated and Ranexa was added.  Patient says that he was doing well following his last hospitalization but over this past weekend, he had multiple episodes of nitrate responsive chest discomfort that awoke him Saturday night and again Sunday night.  He was not having any symptoms during the day.  Prior to this past weekend, he was walking 3 miles daily without symptoms or limitations.  Of note, he is tolerating higher dose of isosorbide but was not able to fill his Ranexa prescription secondary to cost.  Because of multiple symptoms over the weekend, he called and spoke to our call staff and was subsequently set up to see me today.  He did not have any chest discomfort today.  It is also notable that he has not had any exertional symptoms since last hospitalization.  He denies PND, orthopnea, dizziness, syncope, edema, or early satiety.  He did have some dark stools on December  5 and was seen in the emergency department.  H&H was stable and outpatient follow-up with [facility] was recommended.  He has not had any recurrent dark stools.  Home Medications    Prior to Admission medications   Medication Sig Start Date End Date Taking? Authorizing Provider  acetaminophen (TYLENOL) 325 MG tablet Take 2 tablets (650 mg total) by mouth every 6 (six) hours as needed for mild pain (or Fever >/= 101). 04/26/17   Nicholes Mango, MD  aspirin EC 81 MG tablet Take 1 tablet (81 mg total) by mouth daily. 06/13/17   Rogelia Mire, NP  atorvastatin (LIPITOR) 80 MG tablet Take 1 tablet (80 mg total) by mouth at bedtime. 04/30/17   [name], NP  blood glucose meter kit and supplies Dispense based on patient and insurance preference. Use 1 time daily as directed. (FOR ICD-9 250.00, 250.01). 04/30/17   [name], NP  Blood Pressure Monitoring (BLOOD PRESSURE CUFF) MISC 1 Units by Does not apply route daily. 04/30/17   [name], NP  clopidogrel (PLAVIX) 75 MG tablet Take 1 tablet (75 mg total) daily by mouth.  05/21/17   Rise Mu, PA-C  digoxin (LANOXIN) 0.125 MG tablet Take 0.5 tablets (0.0625 mg total) by mouth daily. 06/05/17 06/05/18  Rise Mu, PA-C  docusate sodium (COLACE) 100 MG capsule Take 1 capsule (100 mg total) by mouth 2 (two) times daily. 06/01/17   Gladstone Lighter, MD  empagliflozin (JARDIANCE) 25 MG TABS tablet Take 25 mg daily by mouth. 05/15/17   [name], NP  isosorbide mononitrate (IMDUR) 60 MG 24 hr tablet Take 1 tablet (60 mg total) by mouth daily. 06/13/17 09/11/17  Rogelia Mire, NP  lisinopril (PRINIVIL,ZESTRIL) 5 MG tablet Take 1 tablet (5 mg total) at bedtime by mouth. 05/21/17   Dunn, Areta Haber, PA-C  meclizine (ANTIVERT) 25 MG tablet Take 25 mg by mouth 3 (three) times daily as needed for dizziness.    [provider]  metFORMIN (GLUCOPHAGE-XR) 500 MG 24 hr tablet Take 1 tablet (500 mg total)  daily with breakfast by mouth. 05/15/17 05/15/18  [name], NP  metoprolol succinate (TOPROL-XL) 100 MG 24 hr tablet Take 1 tablet (100 mg total) daily by mouth. Take with or immediately following a meal. 05/09/17   Gladstone Lighter, MD  nitroGLYCERIN (NITROSTAT) 0.4 MG SL tablet Place 1 tablet (0.4 mg total) under the tongue every 5 (five) minutes as needed for chest pain. 04/16/17   Wellington Hampshire, MD  pantoprazole (PROTONIX) 40 MG tablet Take 1 tablet (40 mg total) daily by mouth. 05/21/17   Dunn, Areta Haber, PA-C  ranolazine (RANEXA) 1000 MG Cummings tablet Take 1 tablet (1,000 mg total) by mouth 2 (two) times daily. 06/13/17   Rogelia Mire, NP  spironolactone (ALDACTONE) 25 MG tablet Take 0.5 tablets (12.5 mg total) daily by mouth. 05/21/17 08/19/17  Rise Mu, PA-C    Review of Systems    Nocturnal chest pain as outlined above.  No exertional chest pain.  He denies dyspnea, palpitations, PND, orthopnea, dizziness, syncope, edema, or early satiety.  He did have dark stools on December 5 which have not since recurred.  H&H was normal that day.  All other systems reviewed and are otherwise negative except as noted above.  Physical Exam    VS:  BP (!) 102/52 (BP Location: Left Arm, Patient Position: Sitting, Cuff Size: Normal)   Pulse 89   Ht 5' 9"  (1.753 m)   Wt 224 lb 12 oz (101.9 kg)   BMI 33.19 kg/m  , BMI Body mass index is 33.19 kg/m. GEN: Well nourished, well developed, in no acute distress.  HEENT: normal.  Neck: Supple, no JVD, carotid bruits, or masses. Cardiac: Irregularly irregular, 2/6 systolic murmur at the upper sternal border, no rubs, or gallops. No clubbing, cyanosis, edema.  Radials/DP/PT 2+ and equal bilaterally.  Respiratory:  Respirations regular and unlabored, clear to auscultation bilaterally. GI: Soft, nontender, nondistended, BS + x 4. MS: no deformity or atrophy. Skin: warm and dry, no rash. Neuro:  Strength and sensation are intact. Psych:  Normal affect.  Accessory Clinical Findings    ECG -atrial fibrillation, 89, mild lateral ST depression, no acute changes.  Assessment & Plan    1.  Coronary artery disease with unspecified angina: Patient with an extensive cardiac history as outlined above.  He is most recently status post drug-eluting stent placement to the distal left circumflex in early November.  At that time, the proximal circumflex stent was patent.  He is known to have significant in-stent restenosis within the first diagonal and it  is not felt to be amenable to any further intervention.  In that setting, when he was admitted in late November, we opted for medical therapy and he was placed on Ranexa with titration of isosorbide.  Unfortunately, he has not been able to afford Ranexa.  He was doing well, walking up to 3 miles per day without any significant symptoms or limitations over this weekend, he had multiple episodes of substernal chest discomfort awakening him from sleep, lasting about 1 or 2 minutes, and resolving almost immediately with sublingual.  He has not had any recurrent symptoms since Sunday night into Monday.  With he has not had any exertional symptoms.  ECG today does not show any acute changes.  His goal is symptom management and to avoid repeat catheterization if at all possible.  We discussed his anatomy at length today.  I am going to titrate his isosorbide to 60 mg twice daily (blood pressures run in the 120s-140s at home).  We also provided him with samples of Ranexa for the next 3 weeks which should be able to get him through to the new year, when he will then have Medicaid and will be more likely able to afford it.  We did discuss her symptoms could potentially be GI given that they occur at night but not with exertion.  He is on a PPI.  I will see him back in clinic in 1 week to reassess his symptoms.  If he continues to have symptoms despite maximization of medical therapy, we will likely need to pursue  repeat diagnostic catheterization to reevaluate circumflex stents.  Continue aspirin, Plavix, statin, beta-blocker, and ACE inhibitor therapy.  2.  Essential hypertension: Stable.  3.  Hyperlipidemia: LDL was 36 in October.  LFTs within normal limits earlier this month.  Continue statin therapy.  4.  Ischemic cardiomyopathy/HFrEF: EF 35-40%.  Euvolemic on exam.  Continue beta-blocker, ACE inhibitor, and spironolactone.  5.  Rectal bleeding: Noted in September.  Followed by [facility].  He did have some dark stools on December 5 with normal H&H in the emergency department.  No recurrence of dark stools.  6.  Type 2 diabetes mellitus: A1c 9.3 in November.  This is followed by primary care at [facility].  7.  Hypokalemia: Potassium 3.3 on December 5.  Follow-up basic metabolic panel today.  8.  Aortic stenosis: Status post bioprosthetic AVR in January 2017.  Normal functioning valve by echo in October.  9.  Permanent Afib:  Rate controlled on  blocker an digoxin.  No OAC 2/2 need for DAPT and rectal bleeding in Sept with melena last week.  10.  Disposition: Follow-up basic metabolic panel today.  Follow-up in clinic in 1 week.  Murray Hodgkins, NP 06/13/2017, 3:19 PM

## 2017-06-13 NOTE — Patient Instructions (Addendum)
Medication Instructions: - Your physician has recommended you make the following change in your medication:  1) Increase imdur (isosorbide) 60 mg- take 1 tablet by mouth twice daily 2) Decrease aspirin to 81 mg- take 1 tablet by mouth once daily 3) Start Ranexa 1000 mg- take 1 tablet by mouth twice daily (samples given today)   Labwork: - Your physician recommends that you have lab work today: [employer]  Procedures/Testing: - none ordered  Follow-Up: - Your physician recommends that you schedule a follow-up appointment in: 1 week with Christopher Givenshris Berge, Christopher Cummings  Thursday 12/20 at 11:00 am   Any Additional Special Instructions Will Be Listed Below (If Applicable).     If you need a refill on your cardiac medications before your next appointment, please call your pharmacy.

## 2017-06-14 ENCOUNTER — Other Ambulatory Visit: Payer: Self-pay

## 2017-06-14 ENCOUNTER — Emergency Department: Payer: BLUE CROSS/BLUE SHIELD

## 2017-06-14 ENCOUNTER — Observation Stay
Admission: EM | Admit: 2017-06-14 | Discharge: 2017-06-16 | Disposition: A | Discharge status: Home / Self Care | Payer: BLUE CROSS/BLUE SHIELD | Attending: Internal Medicine | Admitting: Internal Medicine

## 2017-06-14 ENCOUNTER — Encounter: Payer: BLUE CROSS/BLUE SHIELD | Admitting: *Deleted

## 2017-06-14 VITALS — BP 124/70 | HR 107

## 2017-06-14 DIAGNOSIS — I200 Unstable angina: Secondary | ICD-10-CM | POA: Diagnosis not present

## 2017-06-14 DIAGNOSIS — Y838 Other surgical procedures as the cause of abnormal reaction of the patient, or of later complication, without mention of misadventure at the time of the procedure: Secondary | ICD-10-CM | POA: Insufficient documentation

## 2017-06-14 DIAGNOSIS — I255 Ischemic cardiomyopathy: Secondary | ICD-10-CM | POA: Diagnosis not present

## 2017-06-14 DIAGNOSIS — Z96652 Presence of left artificial knee joint: Secondary | ICD-10-CM | POA: Insufficient documentation

## 2017-06-14 DIAGNOSIS — Z953 Presence of xenogenic heart valve: Secondary | ICD-10-CM | POA: Diagnosis not present

## 2017-06-14 DIAGNOSIS — Z79899 Other long term (current) drug therapy: Secondary | ICD-10-CM | POA: Insufficient documentation

## 2017-06-14 DIAGNOSIS — I2582 Chronic total occlusion of coronary artery: Secondary | ICD-10-CM | POA: Diagnosis not present

## 2017-06-14 DIAGNOSIS — Z951 Presence of aortocoronary bypass graft: Secondary | ICD-10-CM | POA: Diagnosis not present

## 2017-06-14 DIAGNOSIS — R079 Chest pain, unspecified: Secondary | ICD-10-CM | POA: Diagnosis present

## 2017-06-14 DIAGNOSIS — Z955 Presence of coronary angioplasty implant and graft: Secondary | ICD-10-CM | POA: Diagnosis not present

## 2017-06-14 DIAGNOSIS — E119 Type 2 diabetes mellitus without complications: Secondary | ICD-10-CM | POA: Diagnosis not present

## 2017-06-14 DIAGNOSIS — Z9861 Coronary angioplasty status: Secondary | ICD-10-CM

## 2017-06-14 DIAGNOSIS — I5022 Chronic systolic (congestive) heart failure: Secondary | ICD-10-CM | POA: Insufficient documentation

## 2017-06-14 DIAGNOSIS — I25110 Atherosclerotic heart disease of native coronary artery with unstable angina pectoris: Principal | ICD-10-CM | POA: Insufficient documentation

## 2017-06-14 DIAGNOSIS — T82855A Stenosis of coronary artery stent, initial encounter: Secondary | ICD-10-CM | POA: Insufficient documentation

## 2017-06-14 DIAGNOSIS — I252 Old myocardial infarction: Secondary | ICD-10-CM | POA: Diagnosis not present

## 2017-06-14 DIAGNOSIS — Z8249 Family history of ischemic heart disease and other diseases of the circulatory system: Secondary | ICD-10-CM | POA: Insufficient documentation

## 2017-06-14 DIAGNOSIS — I214 Non-ST elevation (NSTEMI) myocardial infarction: Secondary | ICD-10-CM

## 2017-06-14 DIAGNOSIS — Z7902 Long term (current) use of antithrombotics/antiplatelets: Secondary | ICD-10-CM | POA: Diagnosis not present

## 2017-06-14 DIAGNOSIS — E785 Hyperlipidemia, unspecified: Secondary | ICD-10-CM | POA: Diagnosis not present

## 2017-06-14 DIAGNOSIS — I110 Hypertensive heart disease with heart failure: Secondary | ICD-10-CM | POA: Insufficient documentation

## 2017-06-14 DIAGNOSIS — Z7982 Long term (current) use of aspirin: Secondary | ICD-10-CM | POA: Insufficient documentation

## 2017-06-14 DIAGNOSIS — I482 Chronic atrial fibrillation: Secondary | ICD-10-CM | POA: Diagnosis not present

## 2017-06-14 DIAGNOSIS — Z7984 Long term (current) use of oral hypoglycemic drugs: Secondary | ICD-10-CM | POA: Diagnosis not present

## 2017-06-14 LAB — PROTIME-INR
INR: 1.09
PROTHROMBIN TIME: 14 s (ref 11.4–15.2)

## 2017-06-14 LAB — BASIC METABOLIC PANEL
ANION GAP: 10 (ref 5–15)
BUN / CREAT RATIO: 17 (ref 10–24)
BUN: 13 mg/dL (ref 8–27)
BUN: 15 mg/dL (ref 6–20)
CALCIUM: 9.6 mg/dL (ref 8.6–10.2)
CHLORIDE: 105 mmol/L (ref 96–106)
CHLORIDE: 102 mmol/L (ref 101–111)
CO2: 19 mmol/L — ABNORMAL LOW (ref 20–29)
CO2: 23 mmol/L (ref 22–32)
CREATININE: 0.78 mg/dL (ref 0.76–1.27)
Calcium: 9.3 mg/dL (ref 8.9–10.3)
Creatinine, Ser: 0.82 mg/dL (ref 0.61–1.24)
GFR calc Af Amer: >60 mL/min (ref >60)
GFR calc non Af Amer: 95 mL/min/{1.73_m2} (ref >59)
GFR, EST AFRICAN AMERICAN: 110 mL/min/{1.73_m2} (ref >59)
GLUCOSE: 181 mg/dL — AB (ref 65–99)
Glucose: 164 mg/dL — ABNORMAL HIGH (ref 65–99)
Potassium: 4.3 mmol/L (ref 3.5–5.2)
Potassium: 3.4 mmol/L — ABNORMAL LOW (ref 3.5–5.1)
SODIUM: 135 mmol/L (ref 135–145)
Sodium: 145 mmol/L — ABNORMAL HIGH (ref 134–144)

## 2017-06-14 LAB — TROPONIN I
TROPONIN I: 0.04 ng/mL — AB (ref <0.03)
Troponin I: <0.03 ng/mL (ref <0.03)
Troponin I: 0.05 ng/mL (ref <0.03)

## 2017-06-14 LAB — CBC
HEMATOCRIT: 38.8 % — AB (ref 40.0–52.0)
HEMOGLOBIN: 13.1 g/dL (ref 13.0–18.0)
MCH: 27.6 pg (ref 26.0–34.0)
MCHC: 33.7 g/dL (ref 32.0–36.0)
MCV: 82 fL (ref 80.0–100.0)
Platelets: 216 10*3/uL (ref 150–440)
RBC: 4.73 MIL/uL (ref 4.40–5.90)
RDW: 15.4 % — ABNORMAL HIGH (ref 11.5–14.5)
WBC: 8.1 10*3/uL (ref 3.8–10.6)

## 2017-06-14 LAB — HEPARIN LEVEL (UNFRACTIONATED)
Heparin Unfractionated: 0.33 IU/mL (ref 0.30–0.70)
Heparin Unfractionated: 0.31 IU/mL (ref 0.30–0.70)

## 2017-06-14 LAB — GLUCOSE, CAPILLARY: Glucose-Capillary: 122 mg/dL — ABNORMAL HIGH (ref 65–99)

## 2017-06-14 LAB — APTT: aPTT: 29 seconds (ref 24–36)

## 2017-06-14 MED ORDER — ISOSORBIDE MONONITRATE ER 60 MG PO TB24
60.0000 mg | ORAL_TABLET | Freq: Every day | ORAL | Status: DC
  Administered 2017-06-14 – 2017-06-16 (×3): 60 mg via ORAL
  Filled 2017-06-14 (×3): qty 1

## 2017-06-14 MED ORDER — HEPARIN (PORCINE) IN NACL 100-0.45 UNIT/ML-% IJ SOLN
1250.0000 [IU]/h | INTRAMUSCULAR | Status: DC
  Administered 2017-06-14: 1200 [IU]/h via INTRAVENOUS
  Filled 2017-06-14 (×2): qty 250

## 2017-06-14 MED ORDER — PANTOPRAZOLE SODIUM 40 MG PO TBEC
40.0000 mg | DELAYED_RELEASE_TABLET | Freq: Every day | ORAL | Status: DC
  Administered 2017-06-14 – 2017-06-16 (×3): 40 mg via ORAL
  Filled 2017-06-14 (×3): qty 1

## 2017-06-14 MED ORDER — ASPIRIN 300 MG RE SUPP: 300.0000 mg | RECTAL | Status: AC

## 2017-06-14 MED ORDER — POTASSIUM CHLORIDE CRYS ER 20 MEQ PO TBCR
40.0000 meq | EXTENDED_RELEASE_TABLET | Freq: Once | ORAL | Status: AC
  Administered 2017-06-14: 40 meq via ORAL
  Filled 2017-06-14: qty 2

## 2017-06-14 MED ORDER — SODIUM CHLORIDE 0.9% FLUSH: 3.0000 mL | INTRAVENOUS | Status: DC | PRN

## 2017-06-14 MED ORDER — SODIUM CHLORIDE 0.9% FLUSH
3.0000 mL | INTRAVENOUS | Status: DC | PRN
  Administered 2017-06-14: 3 mL via INTRAVENOUS
  Filled 2017-06-14: qty 3

## 2017-06-14 MED ORDER — ONDANSETRON HCL 4 MG/2ML IJ SOLN
4.0000 mg | Freq: Once | INTRAMUSCULAR | Status: AC
Start: 2017-06-14 — End: 2017-06-14
  Administered 2017-06-14: 4 mg via INTRAVENOUS

## 2017-06-14 MED ORDER — ASPIRIN 81 MG PO CHEW
324.0000 mg | CHEWABLE_TABLET | Freq: Once | ORAL | Status: AC
  Administered 2017-06-14: 324 mg via ORAL
  Filled 2017-06-14: qty 4

## 2017-06-14 MED ORDER — MECLIZINE HCL 25 MG PO TABS
25.0000 mg | ORAL_TABLET | Freq: Three times a day (TID) | ORAL | Status: DC | PRN
  Filled 2017-06-14: qty 1

## 2017-06-14 MED ORDER — RANOLAZINE ER 500 MG PO TB12
1000.0000 mg | ORAL_TABLET | Freq: Two times a day (BID) | ORAL | Status: DC
  Administered 2017-06-14 – 2017-06-16 (×5): 1000 mg via ORAL
  Filled 2017-06-14 (×6): qty 2

## 2017-06-14 MED ORDER — SODIUM CHLORIDE 0.9 % IV SOLN: 250.0000 mL | INTRAVENOUS | Status: DC | PRN

## 2017-06-14 MED ORDER — CLOPIDOGREL BISULFATE 75 MG PO TABS
75.0000 mg | ORAL_TABLET | Freq: Every day | ORAL | Status: DC
  Administered 2017-06-14 – 2017-06-16 (×3): 75 mg via ORAL
  Filled 2017-06-14 (×3): qty 1

## 2017-06-14 MED ORDER — ONDANSETRON HCL 4 MG/2ML IJ SOLN: 4.0000 mg | Freq: Four times a day (QID) | INTRAMUSCULAR | Status: DC | PRN

## 2017-06-14 MED ORDER — DIGOXIN 125 MCG PO TABS
0.1250 mg | ORAL_TABLET | Freq: Every day | ORAL | Status: DC
  Administered 2017-06-15 – 2017-06-16 (×2): 0.125 mg via ORAL
  Filled 2017-06-14 (×2): qty 1

## 2017-06-14 MED ORDER — SODIUM CHLORIDE 0.9 % IV SOLN
INTRAVENOUS | Status: DC
  Administered 2017-06-15: 06:00:00 via INTRAVENOUS

## 2017-06-14 MED ORDER — ACETAMINOPHEN 500 MG PO TABS
1000.0000 mg | ORAL_TABLET | Freq: Once | ORAL | Status: AC
  Administered 2017-06-14: 1000 mg via ORAL
  Filled 2017-06-14: qty 2

## 2017-06-14 MED ORDER — METOPROLOL SUCCINATE ER 100 MG PO TB24
100.0000 mg | ORAL_TABLET | Freq: Every day | ORAL | Status: DC
  Administered 2017-06-14 – 2017-06-16 (×3): 100 mg via ORAL
  Filled 2017-06-14 (×3): qty 1

## 2017-06-14 MED ORDER — ASPIRIN EC 81 MG PO TBEC
81.0000 mg | DELAYED_RELEASE_TABLET | Freq: Every day | ORAL | Status: DC
  Administered 2017-06-15 – 2017-06-16 (×2): 81 mg via ORAL
  Filled 2017-06-14 (×2): qty 1

## 2017-06-14 MED ORDER — LISINOPRIL 5 MG PO TABS
5.0000 mg | ORAL_TABLET | Freq: Every day | ORAL | Status: DC
  Administered 2017-06-14: 5 mg via ORAL
  Filled 2017-06-14: qty 1

## 2017-06-14 MED ORDER — ACETAMINOPHEN 325 MG PO TABS: 650.0000 mg | ORAL_TABLET | ORAL | Status: DC | PRN

## 2017-06-14 MED ORDER — ASPIRIN 81 MG PO CHEW
81.0000 mg | CHEWABLE_TABLET | ORAL | Status: AC
  Administered 2017-06-15: 81 mg via ORAL
  Filled 2017-06-14: qty 1

## 2017-06-14 MED ORDER — NITROGLYCERIN 0.4 MG SL SUBL: 0.4000 mg | SUBLINGUAL_TABLET | SUBLINGUAL | Status: DC | PRN

## 2017-06-14 MED ORDER — NITROGLYCERIN IN D5W 200-5 MCG/ML-% IV SOLN
0.0000 ug/min | Freq: Once | INTRAVENOUS | Status: AC
  Administered 2017-06-14: 5 ug/min via INTRAVENOUS
  Filled 2017-06-14: qty 250

## 2017-06-14 MED ORDER — DIGOXIN 0.0625 MG HALF TABLET
0.0625 mg | ORAL_TABLET | Freq: Every day | ORAL | Status: DC
  Administered 2017-06-14: 0.0625 mg via ORAL
  Filled 2017-06-14: qty 1

## 2017-06-14 MED ORDER — ATORVASTATIN CALCIUM 20 MG PO TABS
80.0000 mg | ORAL_TABLET | Freq: Every day | ORAL | Status: DC
  Administered 2017-06-14 – 2017-06-15 (×2): 80 mg via ORAL
  Filled 2017-06-14 (×2): qty 4

## 2017-06-14 MED ORDER — NITROGLYCERIN 0.4 MG SL SUBL
0.4000 mg | SUBLINGUAL_TABLET | SUBLINGUAL | Status: DC | PRN
  Administered 2017-06-14 – 2017-06-15 (×4): 0.4 mg via SUBLINGUAL
  Filled 2017-06-14 (×3): qty 1

## 2017-06-14 MED ORDER — ONDANSETRON HCL 4 MG/2ML IJ SOLN
INTRAMUSCULAR | Status: AC
  Administered 2017-06-14: 4 mg via INTRAVENOUS
  Filled 2017-06-14: qty 2

## 2017-06-14 MED ORDER — SODIUM CHLORIDE 0.9% FLUSH
3.0000 mL | Freq: Two times a day (BID) | INTRAVENOUS | Status: DC
  Administered 2017-06-14: 3 mL via INTRAVENOUS

## 2017-06-14 MED ORDER — HEPARIN SODIUM (PORCINE) 5000 UNIT/ML IJ SOLN
4000.0000 [IU] | Freq: Once | INTRAMUSCULAR | Status: AC
  Administered 2017-06-14: 4000 [IU] via INTRAVENOUS

## 2017-06-14 MED ORDER — ASPIRIN 81 MG PO CHEW
324.0000 mg | CHEWABLE_TABLET | ORAL | Status: AC
  Administered 2017-06-14: 324 mg via ORAL
  Filled 2017-06-14: qty 4

## 2017-06-14 MED ORDER — SODIUM CHLORIDE 0.9% FLUSH
3.0000 mL | Freq: Two times a day (BID) | INTRAVENOUS | Status: DC
  Administered 2017-06-14 – 2017-06-16 (×3): 3 mL via INTRAVENOUS

## 2017-06-14 MED ORDER — SODIUM CHLORIDE 0.9 % IV BOLUS (SEPSIS)
500.0000 mL | Freq: Once | INTRAVENOUS | Status: AC
  Administered 2017-06-14: 500 mL via INTRAVENOUS

## 2017-06-14 MED ORDER — SPIRONOLACTONE 25 MG PO TABS
12.5000 mg | ORAL_TABLET | Freq: Every day | ORAL | Status: DC
  Administered 2017-06-14 – 2017-06-16 (×2): 12.5 mg via ORAL
  Filled 2017-06-14 (×2): qty 0.5
  Filled 2017-06-14 (×2): qty 1
  Filled 2017-06-14: qty 0.5

## 2017-06-14 NOTE — Consult Note (Addendum)
ANTICOAGULATION CONSULT NOTE - Initial Consult  Pharmacy Consult for heparin drip Indication: chest pain/ACS  Allergies  Allergen Reactions  . Metformin And Related Other (See Comments)    Only the regular Metformin (diarrhea)    Patient Measurements: Height: 5\' 9"  (175.3 cm) Weight: 224 lb (101.6 kg) IBW/kg (Calculated) : 70.7 Heparin Dosing Weight: 92.3kg  Vital Signs: Temp: 97.6 F (36.4 C) (12/13 1246) Temp Source: Oral (12/13 1246) BP: 110/52 (12/13 1513) Pulse Rate: 54 (12/13 1513)  Labs: Recent Labs    06/13/17 1506 06/14/17 0833 06/14/17 0912 06/14/17 1348 06/14/17 1619  HGB  --  13.1  --   --   --   HCT  --  38.8*  --   --   --   PLT  --  216  --   --   --   APTT  --   --  29  --   --   LABPROT  --   --  14.0  --   --   INR  --   --  1.09  --   --   HEPARINUNFRC  --   --   --   --  0.33  CREATININE 0.78 0.82  --   --   --   TROPONINI  --  <0.03  --  0.04*  --     Estimated Creatinine Clearance: 107 mL/min (by C-G formula based on SCr of 0.82 mg/dL).   Medical History: Past Medical History:  Diagnosis Date  . Aortic stenosis    a. 07/2015 s/p bioprosthetic AVR (#23 [employer]) - [place]; b. 04/2017 Echo: nl fxn'ing AoV.  Marland Kitchen. CAD (coronary artery disease)    a. 2009 PCI->D1; b. 07/2015 CABG x 2 reported (LIMA-LAD noted on cath 01/2017) [facility] [place]; c. 2017/2018 Prox/Dist LCX & D1 stenting; d. 01/2017 PCI: ISR prox/dist LCX stents (PTCA), ISR D1 (med Rx); e. 04/2017 PCI: LCX 90p ISR (CBA), 70d ISR (CBA), D1 95 ISR (CBA); f. 05/2017 PCI: LM nl, LAD 70p/m, 80d, D1 99 ISR, LCX 70p (2.25x22 Onyx DES), 30d ISR, OM3 90, RCA 10p ISR, 5741m, L->LAD ok.  . Chronic systolic CHF (congestive heart failure) (HCC)    a. 04/2017 Echo: EF 35-40% with nl functioning bioprosthetic AoV, mild MR, nl PA.  . Colitis   . Diabetes mellitus with complication (HCC)   . Diverticulitis   . Essential hypertension   . Hyperlipidemia   . Ischemic cardiomyopathy    a. 04/2017  Echo: EF 35-40%.  . Rectal bleeding    a. 03/2017 -> f/u @ [facility].    Medications:  Scheduled:  . [START ON 06/15/2017] aspirin EC  81 mg Oral Daily  . atorvastatin  80 mg Oral QHS  . clopidogrel  75 mg Oral Daily  . digoxin  0.0625 mg Oral Daily  . isosorbide mononitrate  60 mg Oral Daily  . lisinopril  5 mg Oral QHS  . metoprolol succinate  100 mg Oral Daily  . pantoprazole  40 mg Oral Daily  . ranolazine  1,000 mg Oral BID  . sodium chloride flush  3 mL Intravenous Q12H  . spironolactone  12.5 mg Oral Daily    Assessment: Pt is a 66 year old male with a PMH of CAD, CHF, aortic stenosis, DM who presents with chest pain while in cardiac rehab. Pharmacy consulted to dose heparin drip. Baseline labs WNL. No record of home anticoag  Goal of Therapy:  Heparin level 0.3-0.7  units/ml Monitor platelets by anticoagulation protocol: Yes   Plan:  Give 4000 units bolus x 1 Start heparin infusion at 1200 units/hr Check anti-Xa level in 6 hours and daily while on heparin Continue to monitor H&H and platelets   12/13 @ 1619 1st HL just within therapeutic range at 0.33. Will increase drip to 1250u/hr and check a confirmatory lab in 6 hours. CBC with AM labs per protocol.    Gardner CandleSheema M Hallaji, PharmD, BCPS Clinical Pharmacist 06/14/2017 4:56 PM    12/13 2330 heparin level 0.31. Continue current regimen. Recheck heparin level and CBC with tomorrow AM ;abs.  Fulton ReekMatt Shiv Shuey, PharmD, BCPS  06/15/17 12:33 AM

## 2017-06-14 NOTE — ED Notes (Signed)
Patient transported to X-ray 

## 2017-06-14 NOTE — H&P (Signed)
[facility] [place] NAME: Kaydin Karbowski    MR#:  [id number]  DATE OF BIRTH:  [birth year]  DATE OF ADMISSION:  06/14/2017  PRIMARY CARE PHYSICIAN: [name], NP   REQUESTING/REFERRING PHYSICIAN:   CHIEF COMPLAINT:   Chief Complaint  Patient presents with  . Chest Pain    HISTORY OF PRESENT ILLNESS: Jaiel Saraceno  is a 66 y.o. male with a known history of coronary artery disease, chronic systolic heart failure, aortic stenosis, diabetes mellitus, hypertension, hyperlipidemia, ischemic cardiopathy with EF of 35% experienced chest pain this morning in cardiac rehabilitation. The pain was sharp in nature and it was 7 out of 10 on a scale of 1-10 initially. Patient also reports that on Friday Saturday and Sunday night he was awoken from sleep with chest pain and took 3 nitroglycerin overnight each night at approximately two-hour intervals which improved his pain. Patient also developed lightheaded sensation. And also had some nausea but no vomiting. He had some cold clammy feeling no syncope and palpitations. He took 1 nitroglycerin without significant improvement. He presented to the emergency room he was started on IV nitroglycerin drip and IV heparin drip for unstable angina. First set of troponin was negative he had cardiac cath and stents placed 3 weeks ago and follows up with the [place] cardiology.currently when patient was examined in the emergency room his chest pain was completely resolved. He feels much better.  PAST MEDICAL HISTORY:   Past Medical History:  Diagnosis Date  . Aortic stenosis    a. 07/2015 s/p bioprosthetic AVR (#23 [employer]) - [place]; b. 04/2017 Echo: nl fxn'ing AoV.  Marland Kitchen CAD (coronary artery disease)    a. 2009 PCI->D1; b. 07/2015 CABG x 2 reported (LIMA-LAD noted on cath 01/2017) [facility] - [place]; c. 2017/2018 Prox/Dist LCX & D1 stenting; d. 01/2017 PCI: ISR prox/dist LCX stents (PTCA), ISR  D1 (med Rx); e. 04/2017 PCI: LCX 90p ISR (CBA), 70d ISR (CBA), D1 95 ISR (CBA); f. 05/2017 PCI: LM nl, LAD 70p/m, 80d, D1 99 ISR, LCX 70p (2.25x22 Onyx DES), 30d ISR, OM3 90, RCA 10p ISR, 61m L->LAD ok.  . Chronic systolic CHF (congestive heart failure) ([facility]    a. 04/2017 Echo: EF 35-40% with nl functioning bioprosthetic AoV, mild MR, nl PA.  . Colitis   . Diabetes mellitus with complication ([facility]   . Diverticulitis   . Essential hypertension   . Hyperlipidemia   . Ischemic cardiomyopathy    a. 04/2017 Echo: EF 35-40%.  . Rectal bleeding    a. 03/2017 -> f/u @ [facility].    PAST SURGICAL HISTORY:  Past Surgical History:  Procedure Laterality Date  . AORTIC VALVE REPLACEMENT    . CORONARY ARTERY BYPASS GRAFT    . CORONARY BALLOON ANGIOPLASTY N/A 04/02/2017   Procedure: CORONARY BALLOON ANGIOPLASTY;  Surgeon: KTroy Sine MD;  [place] [facility];  Service: Cardiovascular;  Laterality: N/A;  . CORONARY STENT INTERVENTION N/A 02/26/2017   Procedure: CORONARY STENT INTERVENTION;  Surgeon: AWellington Hampshire MD;  [place] [facility];  Service: Cardiovascular;  Laterality: N/A;  . CORONARY STENT INTERVENTION N/A 05/08/2017   Procedure: CORONARY STENT INTERVENTION;  Surgeon: ENelva Bush MD;  [place] [facility];  Service: Cardiovascular;  Laterality: N/A;  . CORONARY/GRAFT ANGIOGRAPHY N/A 02/26/2017   Procedure: CORONARY/GRAFT ANGIOGRAPHY;  Surgeon: AWellington Hampshire MD;  [place] [facility];  Service: Cardiovascular;  Laterality: N/A;  . CORONARY/GRAFT ANGIOGRAPHY N/A 04/02/2017   Procedure: CORONARY/GRAFT ANGIOGRAPHY;  Surgeon: Troy Sine, MD;  Location: [place] [facility];  Service: Cardiovascular;  Laterality: N/A;  . CORONARY/GRAFT ANGIOGRAPHY N/A 05/08/2017   Procedure: CORONARY/GRAFT ANGIOGRAPHY;  Surgeon: Nelva Bush, MD;  Location: [place] [facility];  Service: Cardiovascular;  Laterality: N/A;  . JOINT REPLACEMENT Left     KNEE  . KNEE ARTHROPLASTY  2000    SOCIAL HISTORY:  Social History   Tobacco Use  . Smoking status: Never Smoker  . Smokeless tobacco: Never Used  Substance Use Topics  . Alcohol use: No    Comment: No Alcohol 12 years, but drank 2 "bottles of liquor" each week for several years prior to quitting    FAMILY HISTORY:  Family History  Problem Relation Age of Onset  . CAD Mother   . Heart failure Mother   . Lupus Mother   . CAD Brother   . Heart attack Brother   . Prostate cancer Father   . Diabetes Sister   . Healthy Paternal Grandmother   . Prostate cancer Paternal Grandfather   . Healthy Brother   . Healthy Sister     DRUG ALLERGIES:  Allergies  Allergen Reactions  . Metformin And Related Other (See Comments)    Only the regular Metformin (diarrhea)    REVIEW OF SYSTEMS:   CONSTITUTIONAL: No fever, fatigue or weakness.  EYES: No blurred or double vision.  EARS, NOSE, AND THROAT: No tinnitus or ear pain.  RESPIRATORY: No cough, shortness of breath, wheezing or hemoptysis.  CARDIOVASCULAR: Has chest pain,  No orthopnea, edema.  GASTROINTESTINAL: Has nausea,  No vomiting, diarrhea or abdominal pain.  GENITOURINARY: No dysuria, hematuria.  ENDOCRINE: No polyuria, nocturia,  HEMATOLOGY: No anemia, easy bruising or bleeding SKIN: No rash or lesion. MUSCULOSKELETAL: No joint pain or arthritis.   NEUROLOGIC: No tingling, numbness, weakness.  PSYCHIATRY: No anxiety or depression.   MEDICATIONS AT HOME:  Prior to Admission medications   Medication Sig Start Date End Date Taking? Authorizing Provider  acetaminophen (TYLENOL) 325 MG tablet Take 2 tablets (650 mg total) by mouth every 6 (six) hours as needed for mild pain (or Fever >/= 101). 04/26/17  Yes Gouru, Illene Silver, MD  aspirin EC 81 MG tablet Take 1 tablet (81 mg total) by mouth daily. 06/13/17  Yes Rogelia Mire, NP  atorvastatin (LIPITOR) 80 MG tablet Take 1 tablet (80 mg total) by mouth at bedtime.  04/30/17  Yes [name], NP  clopidogrel (PLAVIX) 75 MG tablet Take 1 tablet (75 mg total) daily by mouth. 05/21/17  Yes Dunn, Areta Haber, PA-C  digoxin (LANOXIN) 0.125 MG tablet Take 0.5 tablets (0.0625 mg total) by mouth daily. 06/05/17 06/05/18 Yes Dunn, Areta Haber, PA-C  empagliflozin (JARDIANCE) 25 MG TABS tablet Take 25 mg daily by mouth. 05/15/17  Yes [name], NP  isosorbide mononitrate (IMDUR) 60 MG 24 hr tablet Take 1 tablet (60 mg total) by mouth daily. 06/13/17 09/11/17 Yes Rogelia Mire, NP  lisinopril (PRINIVIL,ZESTRIL) 5 MG tablet Take 1 tablet (5 mg total) at bedtime by mouth. 05/21/17  Yes Dunn, Areta Haber, PA-C  metFORMIN (GLUCOPHAGE-XR) 500 MG 24 hr tablet Take 1 tablet (500 mg total) daily with breakfast by mouth. 05/15/17 05/15/18 Yes [name], NP  metoprolol succinate (TOPROL-XL) 100 MG 24 hr tablet Take 1 tablet (100 mg total) daily by mouth. Take with or immediately following a meal. 05/09/17  Yes  Gladstone Lighter, MD  pantoprazole (PROTONIX) 40 MG tablet Take 1 tablet (40 mg total) daily by mouth. 05/21/17  Yes Dunn, Areta Haber, PA-C  ranolazine (RANEXA) 1000 MG SR tablet Take 1 tablet (1,000 mg total) by mouth 2 (two) times daily. 06/13/17  Yes Rogelia Mire, NP  spironolactone (ALDACTONE) 25 MG tablet Take 0.5 tablets (12.5 mg total) daily by mouth. 05/21/17 08/19/17 Yes Dunn, Areta Haber, PA-C  blood glucose meter kit and supplies Dispense based on patient and insurance preference. Use 1 time daily as directed. (FOR ICD-9 250.00, 250.01). 04/30/17   [name], NP  Blood Pressure Monitoring (BLOOD PRESSURE CUFF) MISC 1 Units by Does not apply route daily. 04/30/17   [name], NP  docusate sodium (COLACE) 100 MG capsule Take 1 capsule (100 mg total) by mouth 2 (two) times daily. Patient not taking: Reported on 06/14/2017 06/01/17   Gladstone Lighter, MD  meclizine (ANTIVERT) 25 MG tablet Take 25 mg by mouth 3 (three) times  daily as needed for dizziness.    [provider]  nitroGLYCERIN (NITROSTAT) 0.4 MG SL tablet Place 1 tablet (0.4 mg total) under the tongue every 5 (five) minutes as needed for chest pain. 04/16/17   Wellington Hampshire, MD      PHYSICAL EXAMINATION:   VITAL SIGNS: Blood pressure 117/61, pulse (!) 54, temperature (!) 97.5 F (36.4 C), temperature source Oral, resp. rate 15, height 5' 9"  (1.753 m), weight 101.6 kg (224 lb), SpO2 96 %.  GENERAL:  66 y.o.-year-old well built male patient lying in the bed with no acute distress.  EYES: Pupils equal, round, reactive to light and accommodation. No scleral icterus. Extraocular muscles intact.  HEENT: Head atraumatic, normocephalic. Oropharynx and nasopharynx clear.  NECK:  Supple, no jugular venous distention. No thyroid enlargement, no tenderness.  LUNGS: Normal breath sounds bilaterally, no wheezing, rales,rhonchi or crepitation. No use of accessory muscles of respiration.  CARDIOVASCULAR: S1, S2 normal. No murmurs, rubs, or gallops.  ABDOMEN: Soft, nontender, nondistended. Bowel sounds present. No organomegaly or mass.  EXTREMITIES: No pedal edema, cyanosis, or clubbing.  NEUROLOGIC: Cranial nerves II through XII are intact. Muscle strength 5/5 in all extremities. Sensation intact. Gait not checked.  PSYCHIATRIC: The patient is alert and oriented x 3.  SKIN: No obvious rash, lesion, or ulcer.   LABORATORY PANEL:   CBC Recent Labs  Lab 06/14/17 0833  WBC 8.1  HGB 13.1  HCT 38.8*  PLT 216  MCV 82.0  MCH 27.6  MCHC 33.7  RDW 15.4*   ------------------------------------------------------------------------------------------------------------------  Chemistries  Recent Labs  Lab 06/13/17 1506 06/14/17 0833  NA 145* 135  K 4.3 3.4*  CL 105 102  CO2 19* 23  GLUCOSE 164* 181*  BUN 13 15  CREATININE 0.78 0.82  CALCIUM 9.6 9.3    ------------------------------------------------------------------------------------------------------------------ estimated creatinine clearance is 107 mL/min (by C-G formula based on SCr of 0.82 mg/dL). ------------------------------------------------------------------------------------------------------------------ No results for input(s): TSH, T4TOTAL, T3FREE, THYROIDAB in the last 72 hours.  Invalid input(s): FREET3   Coagulation profile Recent Labs  Lab 06/14/17 0912  INR 1.09   ------------------------------------------------------------------------------------------------------------------- No results for input(s): DDIMER in the last 72 hours. -------------------------------------------------------------------------------------------------------------------  Cardiac Enzymes Recent Labs  Lab 06/14/17 0833  TROPONINI <0.03   ------------------------------------------------------------------------------------------------------------------ Invalid input(s): POCBNP  ---------------------------------------------------------------------------------------------------------------  Urinalysis No results found for: COLORURINE, APPEARANCEUR, LABSPEC, PHURINE, GLUCOSEU, HGBUR, BILIRUBINUR, KETONESUR, PROTEINUR, UROBILINOGEN, NITRITE, LEUKOCYTESUR   RADIOLOGY: Dg Chest 2 View  Result Date: 06/14/2017 CLINICAL DATA:  Chest pain  EXAM: CHEST  2 VIEW COMPARISON:  05/31/2017 FINDINGS: Normal heart size. Previous median sternotomy and CABG procedure. No pleural effusion or edema. Scar like densities identified within the lateral aspect of the left lower lung zone. No superimposed airspace consolidation. IMPRESSION: 1. No acute cardiopulmonary abnormalities. Electronically Signed   By: Kerby Moors M.D.   On: 06/14/2017 09:03    EKG: Orders placed or performed during the hospital encounter of 06/14/17  . ED EKG within 10 minutes  . ED EKG within 10 minutes    IMPRESSION AND  PLAN: 66 year old male patient with history of coronary artery disease, cardiac stent, ischemic cardiopathy with EF of 35%, hypertension, hyperlipidemia, aortic stenosis presented to the emergency room with chest pain.  Admitting diagnosis 1. Unstable angina 2. Coronary artery disease 3.Ischemic cardio myopathy. 4.hypertension  5.hyperlipidemia  6.type 2 diabetes mellitus  Treatment plan  Admit patient to telemetry observation bed  Resume aspirin and Plavix  Anticoagulation with IV heparin drip  [facility] cardiology consultation  Cardiac catheterization cardiac stress test as per cardiology  Resume cardiac medications  Continue nitrates, beta blocker, statin   All the records are reviewed and case discussed with ED provider. Management plans discussed with the patient, family and they are in agreement.  CODE STATUS:FULL CODE Code Status History    Date Active Date Inactive Code Status Order ID Comments User Context   05/31/2017 10:53 06/01/2017 16:03 Full Code [id number]  Epifanio Lesches, MD ED   05/07/2017 02:49 05/09/2017 18:58 Full Code [id number]  Lance Coon, MD Inpatient   04/24/2017 00:35 04/26/2017 18:44 Full Code [id number]  Lance Coon, MD Inpatient   03/31/2017 20:25 04/03/2017 18:53 Full Code [id number]  Flossie Dibble, MD Inpatient   03/13/2017 01:41 03/14/2017 15:32 Full Code [id number]  Lance Coon, MD Inpatient   02/24/2017 04:20 02/27/2017 14:10 Full Code [id number]  Harrie Foreman, MD Inpatient       TOTAL TIME TAKING CARE OF THIS PATIENT: 52 minutes.    Saundra Shelling M.D on 06/14/2017 at 11:15 AM  Between 7am to 6pm - Pager - [phone]  After 6pm go to [email or web address] - password [email or web address] [facility]  Office  [phone]  CC: Primary care physician; [name], NP

## 2017-06-14 NOTE — Plan of Care (Signed)
  Education: Knowledge of General Education information will improve 06/14/2017 2341 - Progressing by Kalman JewelsBallentine, Zanayah Shadowens, RN   Clinical Measurements: Ability to maintain clinical measurements within normal limits will improve 06/14/2017 2341 - Progressing by Kalman JewelsBallentine, Alyanna Stoermer, RN   Clinical Measurements: Cardiovascular complication will be avoided 06/14/2017 2341 - Progressing by Kalman JewelsBallentine, Codi Folkerts, RN   Clinical Measurements: Respiratory complications will improve 06/14/2017 2341 - Progressing by Kalman JewelsBallentine, Mela Perham, RN

## 2017-06-14 NOTE — Progress Notes (Signed)
Incomplete Session Note  Patient Details  Name: Christopher ElseCharles Hirschmann Sr. MRN: [id number] Date of Birth: May 22, 1951 Referring Provider:     [facility]  Referring Provider  Lorine BearsArida, Muhammad MD      Christopher Elseharles Sahlin Sr. did not complete his rehab session.  Due to chest pain taken to ER

## 2017-06-14 NOTE — ED Notes (Signed)
Pt has no cp relief with subling nitro, MD aware

## 2017-06-14 NOTE — ED Notes (Signed)
First nurse note  Brought up from cardiac rehab with chest pain

## 2017-06-14 NOTE — Consult Note (Signed)
ANTICOAGULATION CONSULT NOTE - Initial Consult  Pharmacy Consult for heparin drip Indication: chest pain/ACS  Allergies  Allergen Reactions  . Metformin And Related Other (See Comments)    Only the regular Metformin (diarrhea)    Patient Measurements: Height: 5\' 9"  (175.3 cm) Weight: 224 lb (101.6 kg) IBW/kg (Calculated) : 70.7 Heparin Dosing Weight: 92.3kg  Vital Signs: Temp: 97.6 F (36.4 C) (12/13 1246) Temp Source: Oral (12/13 1246) BP: 106/66 (12/13 1246) Pulse Rate: 80 (12/13 1246)  Labs: Recent Labs    06/13/17 1506 06/14/17 0833 06/14/17 0912  HGB  --  13.1  --   HCT  --  38.8*  --   PLT  --  216  --   APTT  --   --  29  LABPROT  --   --  14.0  INR  --   --  1.09  CREATININE 0.78 0.82  --   TROPONINI  --  <0.03  --     Estimated Creatinine Clearance: 107 mL/min (by C-G formula based on SCr of 0.82 mg/dL).   Medical History: Past Medical History:  Diagnosis Date  . Aortic stenosis    a. 07/2015 s/p bioprosthetic AVR (#23 [employer]) - [place]; b. 04/2017 Echo: nl fxn'ing AoV.  Marland Kitchen. CAD (coronary artery disease)    a. 2009 PCI->D1; b. 07/2015 CABG x 2 reported (LIMA-LAD noted on cath 01/2017) [facility] [place]; c. 2017/2018 Prox/Dist LCX & D1 stenting; d. 01/2017 PCI: ISR prox/dist LCX stents (PTCA), ISR D1 (med Rx); e. 04/2017 PCI: LCX 90p ISR (CBA), 70d ISR (CBA), D1 95 ISR (CBA); f. 05/2017 PCI: LM nl, LAD 70p/m, 80d, D1 99 ISR, LCX 70p (2.25x22 Onyx DES), 30d ISR, OM3 90, RCA 10p ISR, 1458m, L->LAD ok.  . Chronic systolic CHF (congestive heart failure) (HCC)    a. 04/2017 Echo: EF 35-40% with nl functioning bioprosthetic AoV, mild MR, nl PA.  . Colitis   . Diabetes mellitus with complication (HCC)   . Diverticulitis   . Essential hypertension   . Hyperlipidemia   . Ischemic cardiomyopathy    a. 04/2017 Echo: EF 35-40%.  . Rectal bleeding    a. 03/2017 -> f/u @ [facility].    Medications:  Scheduled:  . aspirin  324 mg Oral NOW   Or  . aspirin   300 mg Rectal NOW  . [START ON 06/15/2017] aspirin EC  81 mg Oral Daily  . atorvastatin  80 mg Oral QHS  . clopidogrel  75 mg Oral Daily  . digoxin  0.0625 mg Oral Daily  . isosorbide mononitrate  60 mg Oral Daily  . lisinopril  5 mg Oral QHS  . metoprolol succinate  100 mg Oral Daily  . pantoprazole  40 mg Oral Daily  . ranolazine  1,000 mg Oral BID  . sodium chloride flush  3 mL Intravenous Q12H  . spironolactone  12.5 mg Oral Daily    Assessment: Pt is a 66 year old male with a PMH of CAD, CHF, aortic stenosis, DM who presents with chest pain while in cardiac rehab. Pharmacy consulted to dose heparin drip. Baseline labs WNL. No record of home anticoag  Goal of Therapy:  Heparin level 0.3-0.7 units/ml Monitor platelets by anticoagulation protocol: Yes   Plan:  Give 4000 units bolus x 1 Start heparin infusion at 1200 units/hr Check anti-Xa level in 6 hours and daily while on heparin Continue to monitor H&H and platelets  Melissa D Maccia, Pharm.D, BCPS  Clinical Pharmacist  06/14/2017,12:58 PM

## 2017-06-14 NOTE — Consult Note (Signed)
Cardiology Consult    Patient ID: Christopher Cummings Sr. MRN: [id number], DOB/AGE: Nov 02, 1950   Admit date: 06/14/2017 Date of Consult: 06/14/2017  Primary Physician: Christopher Manila, NP Primary Cardiologist: Christopher Bears, MD Requesting Provider: Dutch Gray MD  Patient Profile    Christopher Cummings Sr. is a 66 y.o. male with a history of CAD status post prior two-vessel CABG with subsequent stenting and multiple percutaneous interventions, aortic stenosis status post bioprosthetic aortic valve replacement, hypertension, hyperlipidemia, ischemic cardiomyopathy with an EF of 35-40%, HFrEF, diabetes, and rectal bleeding, who is being seen today for the evaluation of chest pain at the request of Christopher Cummings.  Past Medical History   Past Medical History:  Diagnosis Date  . Aortic stenosis    a. 07/2015 s/p bioprosthetic AVR (#23 [employer]) - [place]; b. 04/2017 Echo: nl fxn'ing AoV.  Marland Kitchen CAD (coronary artery disease)    a. 2009 PCI->D1; b. 07/2015 CABG x 2 reported (LIMA-LAD noted on cath 01/2017) [facility] - [place]; c. 2017/2018 Prox/Dist LCX & D1 stenting; d. 01/2017 PCI: ISR prox/dist LCX stents (PTCA), ISR D1 (med Rx); e. 04/2017 PCI: LCX 90p ISR (CBA), 70d ISR (CBA), D1 95 ISR (CBA); f. 05/2017 PCI: LM nl, LAD 70p/m, 80d, D1 99 ISR, LCX 70p (2.25x22 [employer] DES), 30d ISR, OM3 90, RCA 10p ISR, 70m, L->LAD ok.  . Chronic systolic CHF (congestive heart failure) (HCC)    a. 04/2017 Echo: EF 35-40% with nl functioning bioprosthetic AoV, mild MR, nl PA.  . Colitis   . Diabetes mellitus with complication (HCC)   . Diverticulitis   . Essential hypertension   . Hyperlipidemia   . Ischemic cardiomyopathy    a. 04/2017 Echo: EF 35-40%.  . Rectal bleeding    a. 03/2017 -> f/u @ [facility].    Past Surgical History:  Procedure Laterality Date  . AORTIC VALVE REPLACEMENT    . CORONARY ARTERY BYPASS GRAFT    . CORONARY BALLOON ANGIOPLASTY N/A 04/02/2017   Procedure: CORONARY BALLOON  ANGIOPLASTY;  Surgeon: Lennette Bihari, MD;  Location: [facility]ASIVE CV [facility];  Service: Cardiovascular;  Laterality: N/A;  . CORONARY STENT INTERVENTION N/A 02/26/2017   Procedure: CORONARY STENT INTERVENTION;  Surgeon: Iran Ouch, MD;  Location: [facility] INVASIVE CV LAB;  Service: Cardiovascular;  Laterality: N/A;  . CORONARY STENT INTERVENTION N/A 05/08/2017   Procedure: CORONARY STENT INTERVENTION;  Surgeon: Yvonne Kendall, MD;  Location: [facility] INVASIVE CV LAB;  Service: Cardiovascular;  Laterality: N/A;  . CORONARY/GRAFT ANGIOGRAPHY N/A 02/26/2017   Procedure: CORONARY/GRAFT ANGIOGRAPHY;  Surgeon: Iran Ouch, MD;  Location: [facility] INVASIVE CV LAB;  Service: Cardiovascular;  Laterality: N/A;  . CORONARY/GRAFT ANGIOGRAPHY N/A 04/02/2017   Procedure: CORONARY/GRAFT ANGIOGRAPHY;  Surgeon: Lennette Bihari, MD;  Location: [facility] INVASIVE CV LAB;  Service: Cardiovascular;  Laterality: N/A;  . CORONARY/GRAFT ANGIOGRAPHY N/A 05/08/2017   Procedure: CORONARY/GRAFT ANGIOGRAPHY;  Surgeon: Yvonne Kendall, MD;  Location: [facility] INVASIVE CV LAB;  Service: Cardiovascular;  Laterality: N/A;  . JOINT REPLACEMENT Left    KNEE  . KNEE ARTHROPLASTY  2000     Allergies  Allergies  Allergen Reactions  . Metformin And Related Other (See Comments)    Only the regular Metformin (diarrhea)    History of Present Illness    66 year old male with the above complex past medical history including coronary artery disease status postStenting of the diagonal in 2009 followed by CABG x2 in January 2017.  At the time of CABG, he  also underwent bioprosthetic aortic valve replacement.  Following that, he required stents to the right coronary artery and proximal and distal left circumflex.  He is unaware of the details as to why he required stenting following CABG but it appears that most likely, a vein graft went down.  Other history includes hypertension, hyperlipidemia, diabetes, and intermittent rectal bleeding with  [facility]/October of this [facility].  He has had multiple hospitalizations related to chest pain with finding of in-stent restenosis of the diagonal and proximal and distal left circumflex in August of this year with subsequent PTCA of the proximal distal left circumflex.  An initial attempt was made to medical management of the diagonal.  In October, he required cutting balloon angioplasty within the diagonal stent as well as proximal and distal left circumflex stents.  In late October, he had repeat admission with chest pain and underwent stress testing which was nonischemic.  Finally, in November, he was admitted with recurrent chest pain and non-STEMI.  He was again found to have severe narrowing/in-stent restenosis in the distal circumflex and diagonal.  The distal circumflex was treated with drug-eluting stent while medical therapy was recommended for the diagonal.  The diagonal is not felt to be a suitable target for repeat intervention.  He was readmitted in late November with recurrent chest pain and troponin of 0.04 with flat trend.  Medical therapy was recommended.  Isosorbide was titrated and Ranexa was added.  Unfortunately, he cannot obtain with next prescription but was doing well, and walking up to 3 miles daily without limitations.  Beginning over this past weekend, he started experiencing nocturnal chest pain that was nitrate responsive.  Had multiple episodes Saturday and Sunday night.  He was seen in clinic on February 12 and was feeling reasonably well without any exertional symptoms.  Nitrate was titrated and he was provided with Ranexa samples.    Christopher Cummings, did well last night and felt well this AM.  His sister drove him to cardiac rehab and upon walking over, he started feeling a little nauseated and swimmy headed.  When he arrived @ rehab, the nausea worsened and he developed sscp.  He says that he felt like he 'was living in the 260's.' He felt sort of drunk.  He  was taken to the ED where ECG was non-acute.  He was mildly tachycardic with stable BP.  He was treated with sl ntg x 2 in the ED without relief and was eventually placed on IV ntg with relief of c/p.  He says that symptoms lasted about four hour in total.  He is currently chest pain free.  Inpatient Medications    . aspirin  324 mg Oral NOW   Or  . aspirin  300 mg Rectal NOW  . [START ON 06/15/2017] aspirin EC  81 mg Oral Daily  . atorvastatin  80 mg Oral QHS  . clopidogrel  75 mg Oral Daily  . digoxin  0.0625 mg Oral Daily  . isosorbide mononitrate  60 mg Oral Daily  . lisinopril  5 mg Oral QHS  . metoprolol succinate  100 mg Oral Daily  . pantoprazole  40 mg Oral Daily  . ranolazine  1,000 mg Oral BID  . sodium chloride flush  3 mL Intravenous Q12H  . spironolactone  12.5 mg Oral Daily    Family History    Family History  Problem Relation Age of Onset  . CAD Mother   . Heart failure Mother   .  Lupus Mother   . CAD Brother   . Heart attack Brother   . Prostate cancer Father   . Diabetes Sister   . Healthy Paternal Grandmother   . Prostate cancer Paternal Grandfather   . Healthy Brother   . Healthy Sister     Social History    Social History   Socioeconomic History  . Marital status: Divorced    Spouse name: Not on file  . Number of children: 1  . Years of education: 19  . Highest education level: 12th grade  Social Needs  . Financial resource strain: Not hard at all  . Food insecurity - worry: Never true  . Food insecurity - inability: Never true  . Transportation needs - medical: No  . Transportation needs - non-medical: No  Occupational History  . Not on file  Tobacco Use  . Smoking status: Never Smoker  . Smokeless tobacco: Never Used  Substance and Sexual Activity  . Alcohol use: No    Comment: No Alcohol 12 years, but drank 2 "bottles of liquor" each week for several years prior to quitting  . Drug use: No  . Sexual activity: Not Currently  Other  Topics Concern  . Not on file  Social History Narrative  . Not on file     Review of Systems    General:  No chills, fever, night sweats or weight changes.  Cardiovascular:  +++ chest pain and lightheadedness, no dyspnea on exertion, edema, orthopnea, palpitations, paroxysmal nocturnal dyspnea. Dermatological: No rash, lesions/masses Respiratory: No cough, dyspnea Urologic: No hematuria, dysuria Abdominal:   +++ nausea, no vomiting, diarrhea, bright red blood per rectum, melena, or hematemesis Neurologic:  No visual changes, wkns, changes in mental status. All other systems reviewed and are otherwise negative except as noted above.  Physical Exam    Blood pressure 106/66, pulse 80, temperature 97.6 F (36.4 C), temperature source Oral, resp. rate 18, height 5\' 9"  (1.753 m), weight 224 lb (101.6 kg), SpO2 98 %.  General: Pleasant, NAD Psych: Normal affect. Neuro: Alert and oriented X 3. Moves all extremities spontaneously. HEENT: Normal  Neck: Supple without bruits or JVD. Lungs:  Resp regular and unlabored, CTA. Heart: IR, IR, 2/6 syst murmur @ the upper sternal borders, no s3, s4. Abdomen: Soft, non-tender, non-distended, BS + x 4.  Extremities: No clubbing, cyanosis or edema. DP/PT/Radials 2+ and equal bilaterally.  Labs     Recent Labs    06/14/17 0833  TROPONINI <0.03   Lab Results  Component Value Date   WBC 8.1 06/14/2017   HGB 13.1 06/14/2017   HCT 38.8 (L) 06/14/2017   MCV 82.0 06/14/2017   PLT 216 06/14/2017    Recent Labs  Lab 06/14/17 0833  NA 135  K 3.4*  CL 102  CO2 23  BUN 15  CREATININE 0.82  CALCIUM 9.3  GLUCOSE 181*   Lab Results  Component Value Date   CHOL 80 04/25/2017   HDL 29 (L) 04/25/2017   LDLCALC 36 04/25/2017   TRIG 77 04/25/2017     Radiology Studies    Dg Chest 2 View  Result Date: 06/14/2017 CLINICAL DATA:  Chest pain EXAM: CHEST  2 VIEW COMPARISON:  05/31/2017 FINDINGS: Normal heart size. Previous median sternotomy  and CABG procedure. No pleural effusion or edema. Scar like densities identified within the lateral aspect of the left lower lung zone. No superimposed airspace consolidation. IMPRESSION: 1. No acute cardiopulmonary abnormalities. Electronically Signed   By: Ladona Ridgel  Bradly ChrisStroud M.D.   On: 06/14/2017 09:03   Ct Head Wo Contrast  Result Date: 05/31/2017 CLINICAL DATA:  Left-sided facial numbness. EXAM: CT HEAD WITHOUT CONTRAST TECHNIQUE: Contiguous axial images were obtained from the base of the skull through the vertex without intravenous contrast. COMPARISON:  CT head dated May 07, 2017. FINDINGS: Brain: No evidence of acute infarction, hemorrhage, hydrocephalus, extra-axial collection or mass lesion/mass effect. Stable mild cerebral atrophy. Vascular: No hyperdense vessel or unexpected calcification. Skull: Normal. Negative for fracture or focal lesion. Sinuses/Orbits: No acute finding. Other: None. IMPRESSION: No acute intracranial abnormality. Electronically Signed   By: Obie DredgeWilliam T Derry M.D.   On: 05/31/2017 11:46   Dg Chest Port 1 View  Result Date: 05/31/2017 CLINICAL DATA:  66 year old male with a history of left-sided chest tightness EXAM: PORTABLE CHEST 1 VIEW COMPARISON:  05/07/2017 FINDINGS: Cardiomediastinal silhouette unchanged in size and contour. Surgical changes of median sternotomy and valve repair. No central vascular congestion.  No interlobular septal thickening. Coarsened interstitial markings bilaterally. No pneumothorax or pleural effusion IMPRESSION: Chronic lung changes without evidence of superimposed acute cardiopulmonary disease. Surgical changes of median sternotomy and valve repair Electronically Signed   By: Gilmer MorJaime  Wagner D.O.   On: 05/31/2017 09:23    ECG & Cardiac Imaging    Aflutter, 115, ant infarct.  Assessment & Plan    1.  Coronary artery disease/unstable angina: Patient with extensive cardiac history as outlined above.  He is most recent status post DES to the  distal left circumflex in early November.  At that time the proximal circumflex stent was patent however he required urgent stent placement to the distal left circumflex.  First diagonal had in-stent restenosis and this was not felt to be amenable to any further intervention.  He was seen in clinic on Dec 12, with recurrent nocturnal chest discomfort but no symptoms with exertion or during the day.  Nitrate was adjusted and Ranexa samples were provided.  He had recurrent c/p associated with nausea and lightheadedness this AM.  ECG non-acute.  So far troponin nl.  Currently chest pain free.  Duration of Ss ~ 4 hrs.  If troponins remain nl, it makes ischemia highly unlikely, given prolonged duration, however with recurrent symptoms, and now two hospitalizations since his last PCI of the dLCX, he may require cath in the AM to clear the air.  Cont current meds + heparin for now.  2.  Essential hypertension: Stable.   3.  Hyperlipidemia: LDL was 36 in October.  LFTs within normal limits earlier this month.  Continue statin therapy.  4.  Ischemic cardia myopathy/HFrEF: EF 35-40%.  Euvolemic.  Continue beta-blocker, ACE inhibitor, and Spironolactone.  5.  Rectal bleeding: Noted in September.  [facility]  He did have some dark stools on December 5 with normal H&H in the emergency department.  No recurrence of black stools.    6.  Type 2 diabetes mellitus: A1c 9.3 November.  Per internal medicine.  7.  Aortic stenosis: Status post bioprosthetic AVR in January 2017.  Normal functioning valve by echo in October.   8.  Permanent Afib: rate controlled on  blocker and digoxin.  No OAC in setting of need for DAPT and rectal bleeding in Dec.  Signed, Nicolasa Duckinghristopher Zenya Hickam, NP 06/14/2017, 1:04 PM  For questions or updates, please contact   Please consult [email or web address] for contact info under Cardiology/STEMI.

## 2017-06-14 NOTE — ED Triage Notes (Signed)
Pt brought up from cardiac rehab with c/o 4/10 chest pain . States he had an NSTEMI in November. States he took SL nitro x1, states some relief but pain is starting to get worse again. Pt c/o nausea at this time,. Denies SOB

## 2017-06-14 NOTE — H&P (View-Only) (Signed)
 Cardiology Consult    Patient ID: Christopher Janis Sr. MRN: [id number], DOB/AGE: 08/01/1950   Admit date: 06/14/2017 Date of Consult: 06/14/2017  Primary Physician: Kennedy, Lauren Renee, NP Primary Cardiologist: Muhammad Arida, MD Requesting Provider: P. Pyreddy MD  Patient Profile    Christopher Huizar Sr. is a 66 y.o. male with a history of CAD status post prior two-vessel CABG with subsequent stenting and multiple percutaneous interventions, aortic stenosis status post bioprosthetic aortic valve replacement, hypertension, hyperlipidemia, ischemic cardiomyopathy with an EF of 35-40%, HFrEF, diabetes, and rectal bleeding, who is being seen today for the evaluation of chest pain at the request of Dr. Pyreddy.  Past Medical History   Past Medical History:  Diagnosis Date  . Aortic stenosis    a. 07/2015 s/p bioprosthetic AVR (#23 [employer]) - [place]; b. 04/2017 Echo: nl fxn'ing AoV.  . CAD (coronary artery disease)    a. 2009 PCI->D1; b. 07/2015 CABG x 2 reported (LIMA-LAD noted on cath 01/2017) - [facility] [place]; c. 2017/2018 Prox/Dist LCX & D1 stenting; d. 01/2017 PCI: ISR prox/dist LCX stents (PTCA), ISR D1 (med Rx); e. 04/2017 PCI: LCX 90p ISR (CBA), 70d ISR (CBA), D1 95 ISR (CBA); f. 05/2017 PCI: LM nl, LAD 70p/m, 80d, D1 99 ISR, LCX 70p (2.25x22 [employer] DES), 30d ISR, OM3 90, RCA 10p ISR, 30m, L->LAD ok.  . Chronic systolic CHF (congestive heart failure) (HCC)    a. 04/2017 Echo: EF 35-40% with nl functioning bioprosthetic AoV, mild MR, nl [place].  . Colitis   . Diabetes mellitus with complication (HCC)   . Diverticulitis   . Essential hypertension   . Hyperlipidemia   . Ischemic cardiomyopathy    a. 04/2017 Echo: EF 35-40%.  . Rectal bleeding    a. 03/2017 -> f/u @ [facility].    Past Surgical History:  Procedure Laterality Date  . AORTIC VALVE REPLACEMENT    . CORONARY ARTERY BYPASS GRAFT    . CORONARY BALLOON ANGIOPLASTY N/A 04/02/2017   Procedure: CORONARY BALLOON  ANGIOPLASTY;  Surgeon: Kelly, Thomas A, MD;  Location: [facility];  Service: Cardiovascular;  Laterality: N/A;  . CORONARY STENT INTERVENTION N/A 02/26/2017   Procedure: CORONARY STENT INTERVENTION;  Surgeon: Arida, Muhammad A, MD;  Location: [facility];  Service: Cardiovascular;  Laterality: N/A;  . CORONARY STENT INTERVENTION N/A 05/08/2017   Procedure: CORONARY STENT INTERVENTION;  Surgeon: End, Seretha Estabrooks, MD;  Location: [facility];  Service: Cardiovascular;  Laterality: N/A;  . CORONARY/GRAFT ANGIOGRAPHY N/A 02/26/2017   Procedure: CORONARY/GRAFT ANGIOGRAPHY;  Surgeon: Arida, Muhammad A, MD;  Location: [facility];  Service: Cardiovascular;  Laterality: N/A;  . CORONARY/GRAFT ANGIOGRAPHY N/A 04/02/2017   Procedure: CORONARY/GRAFT ANGIOGRAPHY;  Surgeon: Kelly, Thomas A, MD;  Location: [facility];  Service: Cardiovascular;  Laterality: N/A;  . CORONARY/GRAFT ANGIOGRAPHY N/A 05/08/2017   Procedure: CORONARY/GRAFT ANGIOGRAPHY;  Surgeon: End, Brittan Mapel, MD;  Location: [facility];  Service: Cardiovascular;  Laterality: N/A;  . JOINT REPLACEMENT Left    KNEE  . KNEE ARTHROPLASTY  2000     Allergies  Allergies  Allergen Reactions  . Metformin And Related Other (See Comments)    Only the regular Metformin (diarrhea)    History of Present Illness    66-year-old male with the above complex past medical history including coronary artery disease status postStenting of the diagonal in 2009 followed by CABG x2 in January 2017.  At the time of CABG, he   also underwent bioprosthetic aortic valve replacement.  Following that, he required stents to the right coronary artery and proximal and distal left circumflex.  He is unaware of the details as to why he required stenting following CABG but it appears that most likely, a vein graft went down.  Other history includes hypertension, hyperlipidemia, diabetes, and intermittent rectal bleeding with  evaluation by [facility] in September/October of this year.  He has had multiple hospitalizations related to chest pain with finding of in-stent restenosis of the diagonal and proximal and distal left circumflex in August of this year with subsequent PTCA of the proximal distal left circumflex.  An initial attempt was made to medical management of the diagonal.  In October, he required cutting balloon angioplasty within the diagonal stent as well as proximal and distal left circumflex stents.  In late October, he had repeat admission with chest pain and underwent stress testing which was nonischemic.  Finally, in November, he was admitted with recurrent chest pain and non-STEMI.  He was again found to have severe narrowing/in-stent restenosis in the distal circumflex and diagonal.  The distal circumflex was treated with drug-eluting stent while medical therapy was recommended for the diagonal.  The diagonal is not felt to be a suitable target for repeat intervention.  He was readmitted in late November with recurrent chest pain and troponin of 0.04 with flat trend.  Medical therapy was recommended.  Isosorbide was titrated and Ranexa was added.  Unfortunately, he cannot obtain with next prescription but was doing well, and walking up to 3 miles daily without limitations.  Beginning over this past weekend, he started experiencing nocturnal chest pain that was nitrate responsive.  Had multiple episodes Saturday and Sunday night.  He was seen in clinic on February 12 and was feeling reasonably well without any exertional symptoms.  Nitrate was titrated and he was provided with Ranexa samples.    [name], did well last night and felt well this AM.  His sister drove him to cardiac rehab and upon walking over, he started feeling a little nauseated and swimmy headed.  When he arrived @ rehab, the nausea worsened and he developed sscp.  He says that he felt like he 'was living in the 60's.' He felt sort of drunk.  He  was taken to the ED where ECG was non-acute.  He was mildly tachycardic with stable BP.  He was treated with sl ntg x 2 in the ED without relief and was eventually placed on IV ntg with relief of c/p.  He says that symptoms lasted about four hour in total.  He is currently chest pain free.  Inpatient Medications    . aspirin  324 mg Oral NOW   Or  . aspirin  300 mg Rectal NOW  . [START ON 06/15/2017] aspirin EC  81 mg Oral Daily  . atorvastatin  80 mg Oral QHS  . clopidogrel  75 mg Oral Daily  . digoxin  0.0625 mg Oral Daily  . isosorbide mononitrate  60 mg Oral Daily  . lisinopril  5 mg Oral QHS  . metoprolol succinate  100 mg Oral Daily  . pantoprazole  40 mg Oral Daily  . ranolazine  1,000 mg Oral BID  . sodium chloride flush  3 mL Intravenous Q12H  . spironolactone  12.5 mg Oral Daily    Family History    Family History  Problem Relation Age of Onset  . CAD Mother   . Heart failure Mother   .   Lupus Mother   . CAD Brother   . Heart attack Brother   . Prostate cancer Father   . Diabetes Sister   . Healthy Paternal Grandmother   . Prostate cancer Paternal Grandfather   . Healthy Brother   . Healthy Sister     Social History    Social History   Socioeconomic History  . Marital status: Divorced    Spouse name: Not on file  . Number of children: 1  . Years of education: 12  . Highest education level: 12th grade  Social Needs  . Financial resource strain: Not hard at all  . Food insecurity - worry: Never true  . Food insecurity - inability: Never true  . Transportation needs - medical: No  . Transportation needs - non-medical: No  Occupational History  . Not on file  Tobacco Use  . Smoking status: Never Smoker  . Smokeless tobacco: Never Used  Substance and Sexual Activity  . Alcohol use: No    Comment: No Alcohol 12 years, but drank 2 "bottles of liquor" each week for several years prior to quitting  . Drug use: No  . Sexual activity: Not Currently  Other  Topics Concern  . Not on file  Social History Narrative  . Not on file     Review of Systems    General:  No chills, fever, night sweats or weight changes.  Cardiovascular:  +++ chest pain and lightheadedness, no dyspnea on exertion, edema, orthopnea, palpitations, paroxysmal nocturnal dyspnea. Dermatological: No rash, lesions/masses Respiratory: No cough, dyspnea Urologic: No hematuria, dysuria Abdominal:   +++ nausea, no vomiting, diarrhea, bright red blood per rectum, melena, or hematemesis Neurologic:  No visual changes, wkns, changes in mental status. All other systems reviewed and are otherwise negative except as noted above.  Physical Exam    Blood pressure 106/66, pulse 80, temperature 97.6 F (36.4 C), temperature source Oral, resp. rate 18, height 5' 9" (1.753 m), weight 224 lb (101.6 kg), SpO2 98 %.  General: Pleasant, NAD Psych: Normal affect. Neuro: Alert and oriented X 3. Moves all extremities spontaneously. HEENT: Normal  Neck: Supple without bruits or JVD. Lungs:  Resp regular and unlabored, CTA. Heart: IR, IR, 2/6 syst murmur @ the upper sternal borders, no s3, s4. Abdomen: Soft, non-tender, non-distended, BS + x 4.  Extremities: No clubbing, cyanosis or edema. DP/PT/Radials 2+ and equal bilaterally.  Labs     Recent Labs    06/14/17 0833  TROPONINI <0.03   Lab Results  Component Value Date   WBC 8.1 06/14/2017   HGB 13.1 06/14/2017   HCT 38.8 (L) 06/14/2017   MCV 82.0 06/14/2017   PLT 216 06/14/2017    Recent Labs  Lab 06/14/17 0833  NA 135  K 3.4*  CL 102  CO2 23  BUN 15  CREATININE 0.82  CALCIUM 9.3  GLUCOSE 181*   Lab Results  Component Value Date   CHOL 80 04/25/2017   HDL 29 (L) 04/25/2017   LDLCALC 36 04/25/2017   TRIG 77 04/25/2017     Radiology Studies    Dg Chest 2 View  Result Date: 06/14/2017 CLINICAL DATA:  Chest pain EXAM: CHEST  2 VIEW COMPARISON:  05/31/2017 FINDINGS: Normal heart size. Previous median sternotomy  and CABG procedure. No pleural effusion or edema. Scar like densities identified within the lateral aspect of the left lower lung zone. No superimposed airspace consolidation. IMPRESSION: 1. No acute cardiopulmonary abnormalities. Electronically Signed   By: Taylor    Stroud M.D.   On: 06/14/2017 09:03   Ct Head Wo Contrast  Result Date: 05/31/2017 CLINICAL DATA:  Left-sided facial numbness. EXAM: CT HEAD WITHOUT CONTRAST TECHNIQUE: Contiguous axial images were obtained from the base of the skull through the vertex without intravenous contrast. COMPARISON:  CT head dated May 07, 2017. FINDINGS: Brain: No evidence of acute infarction, hemorrhage, hydrocephalus, extra-axial collection or mass lesion/mass effect. Stable mild cerebral atrophy. Vascular: No hyperdense vessel or unexpected calcification. Skull: Normal. Negative for fracture or focal lesion. Sinuses/Orbits: No acute finding. Other: None. IMPRESSION: No acute intracranial abnormality. Electronically Signed   By: William T Derry M.D.   On: 05/31/2017 11:46   Dg Chest Port 1 View  Result Date: 05/31/2017 CLINICAL DATA:  66-year-old male with a history of left-sided chest tightness EXAM: PORTABLE CHEST 1 VIEW COMPARISON:  05/07/2017 FINDINGS: Cardiomediastinal silhouette unchanged in size and contour. Surgical changes of median sternotomy and valve repair. No central vascular congestion.  No interlobular septal thickening. Coarsened interstitial markings bilaterally. No pneumothorax or pleural effusion IMPRESSION: Chronic lung changes without evidence of superimposed acute cardiopulmonary disease. Surgical changes of median sternotomy and valve repair Electronically Signed   By: Jaime  Wagner D.O.   On: 05/31/2017 09:23    ECG & Cardiac Imaging    Aflutter, 115, ant infarct.  Assessment & Plan    1.  Coronary artery disease/unstable angina: Patient with extensive cardiac history as outlined above.  He is most recent status post DES to the  distal left circumflex in early November.  At that time the proximal circumflex stent was patent however he required urgent stent placement to the distal left circumflex.  First diagonal had in-stent restenosis and this was not felt to be amenable to any further intervention.  He was seen in clinic on Dec 12, with recurrent nocturnal chest discomfort but no symptoms with exertion or during the day.  Nitrate was adjusted and Ranexa samples were provided.  He had recurrent c/p associated with nausea and lightheadedness this AM.  ECG non-acute.  So far troponin nl.  Currently chest pain free.  Duration of Ss ~ 4 hrs.  If troponins remain nl, it makes ischemia highly unlikely, given prolonged duration, however with recurrent symptoms, and now two hospitalizations since his last PCI of the dLCX, he may require cath in the AM to clear the air.  Cont current meds + heparin for now.  2.  Essential hypertension: Stable.   3.  Hyperlipidemia: LDL was 36 in October.  LFTs within normal limits earlier this month.  Continue statin therapy.  4.  Ischemic cardia myopathy/HFrEF: EF 35-40%.  Euvolemic.  Continue beta-blocker, ACE inhibitor, and Spironolactone.  5.  Rectal bleeding: Noted in September.  Followed by [facility].  He did have some dark stools on December 5 with normal H&H in the emergency department.  No recurrence of black stools.    6.  Type 2 diabetes mellitus: A1c 9.3 November.  Per internal medicine.  7.  Aortic stenosis: Status post bioprosthetic AVR in January 2017.  Normal functioning valve by echo in October.   8.  Permanent Afib: rate controlled on  blocker and digoxin.  No OAC in setting of need for DAPT and rectal bleeding in Dec.  Signed, Kavina Cantave, NP 06/14/2017, 1:04 PM  For questions or updates, please contact   Please consult [email or web address] for contact info under Cardiology/STEMI.   

## 2017-06-14 NOTE — Plan of Care (Signed)
  Education: Knowledge of General Education information will improve 06/14/2017 2345 - Progressing by Kalman JewelsBallentine, Quintana Canelo, RN 06/14/2017 2341 - Progressing by Kalman JewelsBallentine, Emersyn Kotarski, RN   Health Behavior/Discharge Planning: Ability to manage health-related needs will improve 06/14/2017 2345 - Progressing by Kalman JewelsBallentine, Deshay Blumenfeld, RN 06/14/2017 2341 - Progressing by Kalman JewelsBallentine, Clarence Dunsmore, RN   Clinical Measurements: Ability to maintain clinical measurements within normal limits will improve 06/14/2017 2345 - Progressing by Kalman JewelsBallentine, Gaspard Isbell, RN 06/14/2017 2341 - Progressing by Kalman JewelsBallentine, Derrius Furtick, RN   Clinical Measurements: Respiratory complications will improve 06/14/2017 2345 - Progressing by Kalman JewelsBallentine, Asanti Craigo, RN 06/14/2017 2341 - Progressing by Kalman JewelsBallentine, Promiss Labarbera, RN   Clinical Measurements: Cardiovascular complication will be avoided 06/14/2017 2345 - Progressing by Kalman JewelsBallentine, Gelila Well, RN 06/14/2017 2341 - Progressing by Kalman JewelsBallentine, Ary Rudnick, RN   Activity: Risk for activity intolerance will decrease 06/14/2017 2345 - Progressing by Kalman JewelsBallentine, Eamonn Sermeno, RN 06/14/2017 2341 - Progressing by Kalman JewelsBallentine, Jp Eastham, RN   Pain Managment: General experience of comfort will improve 06/14/2017 2345 - Progressing by Kalman JewelsBallentine, Vaneza Pickart, RN 06/14/2017 2341 - Progressing by Kalman JewelsBallentine, Renelda Kilian, RN

## 2017-06-14 NOTE — ED Provider Notes (Signed)
[facility] Emergency Department Provider Note  ____________________________________________  Time seen: Approximately 9:08 AM  I have reviewed the triage vital signs and the nursing notes.   HISTORY  Chief Complaint Chest Pain    HPI Christopher ElseCharles Batterson Sr. is a 66 y.o. male CAD status post stent placement 3 weeks ago, aortic stenosis, CHF, ischemic cardiomyopathy, on Plavix and aspirin, presenting for chest pain.  The patient was at cardiac rehab, when he had a sharp chest pain that went from the lateral axillary line around towards the midline on the left side.  He then developed a lightheaded sensation with severe nausea but no vomiting.  He had associated cold and clammy feeling.  No palpitations, no syncope.  The patient states that his chest pain has now turned into a "dull ache."  He took one nitroglycerin without significant improvement.  He has not had any of his daily medications, including his aspirin.  The patient also reports that Friday, Saturday, and Sunday night, he was awoken from his sleep with chest pain and took 3 nitroglycerin overnight each night at approximately 2-hour intervals which improved his pain.  The patient was seen and evaluated here 12/5 for concern of "coffee-ground stool."  He was found to have guaiac negative testing and stable blood counts and was discharged home with a plan for outpatient GI follow-up.  The patient has not had any further black or tarry, or bloody stools.  Past Medical History:  Diagnosis Date  . Aortic stenosis    a. 07/2015 s/p bioprosthetic AVR (#23 [employer]) - [place]; b. 04/2017 Echo: nl fxn'ing AoV.  Marland Kitchen. CAD (coronary artery disease)    a. 2009 PCI->D1; b. 07/2015 CABG x 2 reported (LIMA-LAD noted on cath 01/2017) [facility] [place]; c. 2017/2018 Prox/Dist LCX & D1 stenting; d. 01/2017 PCI: ISR prox/dist LCX stents (PTCA), ISR D1 (med Rx); e. 04/2017 PCI: LCX 90p ISR (CBA), 70d ISR (CBA), D1 95 ISR (CBA); f.  05/2017 PCI: LM nl, LAD 70p/m, 80d, D1 99 ISR, LCX 70p (2.25x22 Onyx DES), 30d ISR, OM3 90, RCA 10p ISR, 6727m, L->LAD ok.  . Chronic systolic CHF (congestive heart failure) (HCC)    a. 04/2017 Echo: EF 35-40% with nl functioning bioprosthetic AoV, mild MR, nl [place].  . Colitis   . Diabetes mellitus with complication (HCC)   . Diverticulitis   . Essential hypertension   . Hyperlipidemia   . Ischemic cardiomyopathy    a. 04/2017 Echo: EF 35-40%.  . Rectal bleeding    a. 03/2017 -> f/u @ [facility].    Patient Active Problem List   Diagnosis Date Noted  . Chest tightness 05/31/2017  . Gastroesophageal reflux disease 05/29/2017  . Chronic systolic heart failure (HCC) 05/12/2017  . NSTEMI (non-ST elevated myocardial infarction) (HCC) 05/07/2017  . H/O prosthetic aortic valve replacement 04/01/2017  . Unstable angina (HCC) 03/31/2017  . HTN (hypertension) 03/12/2017  . Diabetes (HCC) 03/12/2017  . CAD (coronary artery disease) 03/12/2017  . History of non-ST elevation myocardial infarction (NSTEMI) 02/24/2017    Past Surgical History:  Procedure Laterality Date  . AORTIC VALVE REPLACEMENT    . CORONARY ARTERY BYPASS GRAFT    . CORONARY BALLOON ANGIOPLASTY N/A 04/02/2017   Procedure: CORONARY BALLOON ANGIOPLASTY;  Surgeon: Lennette BihariKelly, Thomas A, MD;  Location: [facility] INVASIVE CV LAB;  Service: Cardiovascular;  Laterality: N/A;  . CORONARY STENT INTERVENTION N/A 02/26/2017   Procedure: CORONARY STENT INTERVENTION;  Surgeon: Iran OuchArida, Muhammad A, MD;  Location: [facility] INVASIVE CV LAB;  Service: Cardiovascular;  Laterality: N/A;  . CORONARY STENT INTERVENTION N/A 05/08/2017   Procedure: CORONARY STENT INTERVENTION;  Surgeon: Yvonne KendallEnd, Christopher, MD;  Location: [facility] INVASIVE CV LAB;  Service: Cardiovascular;  Laterality: N/A;  . CORONARY/GRAFT ANGIOGRAPHY N/A 02/26/2017   Procedure: CORONARY/GRAFT ANGIOGRAPHY;  Surgeon: Iran OuchArida, Muhammad A, MD;  Location: [facility] INVASIVE CV LAB;  Service: Cardiovascular;  Laterality: N/A;   . CORONARY/GRAFT ANGIOGRAPHY N/A 04/02/2017   Procedure: CORONARY/GRAFT ANGIOGRAPHY;  Surgeon: Lennette BihariKelly, Thomas A, MD;  [facility];  Service: Cardiovascular;  Laterality: N/A;  . CORONARY/GRAFT ANGIOGRAPHY N/A 05/08/2017   Procedure: CORONARY/GRAFT ANGIOGRAPHY;  Surgeon: Yvonne KendallEnd, Christopher, MD;  Location: [facility] INVASIVE CV LAB;  Service: Cardiovascular;  Laterality: N/A;  . JOINT REPLACEMENT Left    KNEE  . KNEE ARTHROPLASTY  2000    Current Outpatient Rx  . Order #: [id number] Class: No Print  . Order #: [id number] Class: No Print  . Order #: [id number] Class: Normal  . Order #: [id number] Class: Print  . Order #: [id number] Class: Normal  . Order #: [id number] Class: Normal  . Order #: [id number] Class: Normal  . Order #: [id number] Class: Normal  . Order #: [id number] Class: Normal  . Order #: [id number] Class: Normal  . Order #: [id number] Class: Normal  . Order #: [id number] Class: Historical Med  . Order #: [id number] Class: Normal  . Order #: [id number] Class: Print  . Order #: [id number] Class: Normal  . Order #: [id number] Class: Normal  . Order #: [id number] Class: Sample  . Order #: [id number] Class: Normal    Allergies Metformin and related  Family History  Problem Relation Age of Onset  . CAD Mother   . Heart failure Mother   . Lupus Mother   . CAD Brother   . Heart attack Brother   . Prostate cancer Father   . Diabetes Sister   . Healthy Paternal Grandmother   . Prostate cancer Paternal Grandfather   . Healthy Brother   . Healthy Sister     Social History Social History   Tobacco Use  . Smoking status: Never Smoker  . Smokeless tobacco: Never Used  Substance Use Topics  . Alcohol use: No    Comment: No Alcohol 12 years, but drank 2 "bottles of liquor" each week for several years prior to quitting  . Drug use: No    Review of Systems Constitutional: No fever/chills.  Positive clammy feeling.  Positive lightheadedness without syncope. Eyes: No visual changes. ENT:  No sore throat. No congestion or rhinorrhea. Cardiovascular: Positive chest pain. Denies palpitations. Respiratory: Denies shortness of breath.  No cough. Gastrointestinal: No abdominal pain.  Positive nausea, no vomiting.  No diarrhea.  No constipation. Genitourinary: Negative for dysuria. Musculoskeletal: Negative for back pain.  No new lower extremity swelling or calf pain. Skin: Negative for rash. Neurological: Negative for headaches. No focal numbness, tingling or weakness.     ____________________________________________   PHYSICAL EXAM:  VITAL SIGNS: ED Triage Vitals [06/14/17 0834]  Enc Vitals Group     BP (!) 151/83     Pulse Rate (!) 114     Resp 20     Temp (!) 97.5 F (36.4 C)     Temp Source Oral     SpO2 100 %     Weight 224 lb (101.6 kg)     Height 5\' 9"  (1.753 m)     Head Circumference      Peak Flow      Pain Score 4     Pain Loc  Pain Edu?      Excl. in GC?     Constitutional: Alert and oriented.  Chronically ill appearing and mildly uncomfortable but nontoxic. Answers questions appropriately. Eyes: Conjunctivae are normal.  EOMI. No scleral icterus. Head: Atraumatic. Nose: No congestion/rhinnorhea. Mouth/Throat: Mucous membranes are moist.  Neck: No stridor.  Supple.  No JVD.  No meningismus. Cardiovascular: Normal rate, regular rhythm. No murmurs, rubs or gallops.  Respiratory: Normal respiratory effort.  No accessory muscle use or retractions. Lungs CTAB.  No wheezes, rales or ronchi. Gastrointestinal: Obese.  Soft, nontender and nondistended.  No guarding or rebound.  No peritoneal signs. GU: Several small nonthrombosed nonbleeding hemorrhoids externally without any palpable internal hemorrhoids.  No pain with rectal examination.  Stool is brown and guaiac negative. Musculoskeletal: No LE edema. No ttp in the calves or palpable cords.  Negative Homan's sign. Neurologic:  A&Ox3.  Speech is clear.  Face and smile are symmetric.  EOMI.  Moves all  extremities well. Skin:  Skin is warm, dry and intact. No rash noted. Psychiatric: Mood and affect are normal. Speech and behavior are normal.  Normal judgement. ____________________________________________   LABS (all labs ordered are listed, but only abnormal results are displayed)  Labs Reviewed  CBC - Abnormal; Notable for the following components:      Result Value   HCT 38.8 (*)    RDW 15.4 (*)    All other components within normal limits  BASIC METABOLIC PANEL  TROPONIN I  PROTIME-INR  APTT  TROPONIN I  TROPONIN I   ____________________________________________  EKG  ED ECG REPORT I, Rockne Menghini, the attending physician, personally viewed and interpreted this ECG.   Date: 06/14/2017  EKG Time: 831  Rate: 115  Rhythm: sinus tachycardia  Axis: normal  Intervals:first-degree A-V block   ST&T Change: no STEMI; no evidence of hypertrophy.  No prolonged QTC or Brugada syndrome.  ____________________________________________  RADIOLOGY  Dg Chest 2 View  Result Date: 06/14/2017 CLINICAL DATA:  Chest pain EXAM: CHEST  2 VIEW COMPARISON:  05/31/2017 FINDINGS: Normal heart size. Previous median sternotomy and CABG procedure. No pleural effusion or edema. Scar like densities identified within the lateral aspect of the left lower lung zone. No superimposed airspace consolidation. IMPRESSION: 1. No acute cardiopulmonary abnormalities. Electronically Signed   By: Signa Kell M.D.   On: 06/14/2017 09:03    ____________________________________________   PROCEDURES  Procedure(s) performed: None  Procedures  Critical Care performed: Yes, see critical care note(s) ____________________________________________   INITIAL IMPRESSION / ASSESSMENT AND PLAN / ED COURSE  Pertinent labs & imaging results that were available during my care of the patient were reviewed by me and considered in my medical decision making (see chart for details).  67 y.o. full chronic  cardiac conditions, including recent end STEMI with stent placement 3 weeks ago, presenting for chest pain, lightheadedness, cold clammy feeling, nausea.  Overall, I am concerned that the patient has been experiencing unstable angina, given his reported symptoms over the weekend and his experience today with exertion.  He has stable vital signs and no evidence of bleeding on examination, so we will proceed with aspirin as well as heparin bolus and drip and will monitor him closely.  He will be given nitroglycerin for his symptoms. PE and aortic pathology are on the differential but much less likely.  The patient will be admitted for further evaluation and treatment.  ----------------------------------------- 9:24 AM on 06/14/2017 -----------------------------------------  At this time, patient will be admitted to  the hospitalist for further evaluation and treatment.  History first troponin is negative although this value was obtained shortly after the onset of his symptoms so he will require serial testing.  He is mildly hypokalemic, so we will supplement that especially in the setting of chest pain.  I am awaiting his response to nitroglycerin.  CRITICAL CARE Performed by: Rockne Menghini   Total critical care time: 40 minutes  Critical care time was exclusive of separately billable procedures and treating other patients.  Critical care was necessary to treat or prevent imminent or life-threatening deterioration.  Critical care was time spent personally by me on the following activities: development of treatment plan with patient and/or surrogate as well as nursing, discussions with consultants, evaluation of patient's response to treatment, examination of patient, obtaining history from patient or surrogate, ordering and performing treatments and interventions, ordering and review of laboratory studies, ordering and review of radiographic studies, pulse oximetry and re-evaluation of  patient's condition.   ____________________________________________  FINAL CLINICAL IMPRESSION(S) / ED DIAGNOSES  Final diagnoses:  None         NEW MEDICATIONS STARTED DURING THIS VISIT:  This SmartLink is deprecated. Use AVSMEDLIST instead to display the medication list for a patient.    Rockne Menghini, MD 06/14/17 [phone]

## 2017-06-15 ENCOUNTER — Encounter
Admission: EM | Disposition: A | Discharge status: Home / Self Care | Payer: Self-pay | Source: Home / Self Care | Attending: Emergency Medicine

## 2017-06-15 DIAGNOSIS — I25110 Atherosclerotic heart disease of native coronary artery with unstable angina pectoris: Secondary | ICD-10-CM | POA: Diagnosis not present

## 2017-06-15 HISTORY — PX: LEFT HEART CATH AND CORONARY ANGIOGRAPHY: CATH118249

## 2017-06-15 LAB — CBC
HEMATOCRIT: 36.8 % — AB (ref 40.0–52.0)
Hemoglobin: 12.1 g/dL — ABNORMAL LOW (ref 13.0–18.0)
MCH: 27.4 pg (ref 26.0–34.0)
MCHC: 33 g/dL (ref 32.0–36.0)
MCV: 83 fL (ref 80.0–100.0)
PLATELETS: 191 10*3/uL (ref 150–440)
RBC: 4.43 MIL/uL (ref 4.40–5.90)
RDW: 15.4 % — AB (ref 11.5–14.5)
WBC: 7 10*3/uL (ref 3.8–10.6)

## 2017-06-15 LAB — HEPARIN LEVEL (UNFRACTIONATED): Heparin Unfractionated: 0.33 IU/mL (ref 0.30–0.70)

## 2017-06-15 LAB — GLUCOSE, CAPILLARY
GLUCOSE-CAPILLARY: 108 mg/dL — AB (ref 65–99)
GLUCOSE-CAPILLARY: 81 mg/dL (ref 65–99)
GLUCOSE-CAPILLARY: 135 mg/dL — AB (ref 65–99)

## 2017-06-15 LAB — TROPONIN I
Troponin I: 0.04 ng/mL (ref <0.03)
Troponin I: 0.03 ng/mL (ref <0.03)

## 2017-06-15 SURGERY — LEFT HEART CATH AND CORONARY ANGIOGRAPHY
Anesthesia: Moderate Sedation

## 2017-06-15 MED ORDER — HEPARIN (PORCINE) IN NACL 2-0.9 UNIT/ML-% IJ SOLN
INTRAMUSCULAR | Status: AC
  Filled 2017-06-15: qty 500

## 2017-06-15 MED ORDER — FENTANYL CITRATE (PF) 100 MCG/2ML IJ SOLN
INTRAMUSCULAR | Status: DC | PRN
  Administered 2017-06-15: 25 ug via INTRAVENOUS

## 2017-06-15 MED ORDER — MIDAZOLAM HCL 2 MG/2ML IJ SOLN
INTRAMUSCULAR | Status: DC | PRN
  Administered 2017-06-15: 1 mg via INTRAVENOUS

## 2017-06-15 MED ORDER — IOPAMIDOL (ISOVUE-300) INJECTION 61%
INTRAVENOUS | Status: DC | PRN
  Administered 2017-06-15: 70 mL via INTRA_ARTERIAL

## 2017-06-15 MED ORDER — HEPARIN SODIUM (PORCINE) 1000 UNIT/ML IJ SOLN
INTRAMUSCULAR | Status: AC
  Filled 2017-06-15: qty 1

## 2017-06-15 MED ORDER — VERAPAMIL HCL 2.5 MG/ML IV SOLN
INTRAVENOUS | Status: AC
  Filled 2017-06-15: qty 2

## 2017-06-15 MED ORDER — SODIUM CHLORIDE 0.9 % IV SOLN: 250.0000 mL | INTRAVENOUS | Status: DC | PRN

## 2017-06-15 MED ORDER — INSULIN ASPART 100 UNIT/ML ~~LOC~~ SOLN: 0.0000 [IU] | Freq: Three times a day (TID) | SUBCUTANEOUS | Status: DC

## 2017-06-15 MED ORDER — FENTANYL CITRATE (PF) 100 MCG/2ML IJ SOLN
INTRAMUSCULAR | Status: AC
  Filled 2017-06-15: qty 2

## 2017-06-15 MED ORDER — VERAPAMIL HCL 2.5 MG/ML IV SOLN
INTRAVENOUS | Status: AC
Start: 2017-06-15 — End: ?
  Filled 2017-06-15: qty 2

## 2017-06-15 MED ORDER — LIDOCAINE HCL (PF) 1 % IJ SOLN
INTRAMUSCULAR | Status: AC
  Filled 2017-06-15: qty 30

## 2017-06-15 MED ORDER — SODIUM CHLORIDE 0.9% FLUSH: 3.0000 mL | INTRAVENOUS | Status: DC | PRN

## 2017-06-15 MED ORDER — MIDAZOLAM HCL 2 MG/2ML IJ SOLN
INTRAMUSCULAR | Status: AC
Start: 2017-06-15 — End: ?
  Filled 2017-06-15: qty 2

## 2017-06-15 MED ORDER — INSULIN ASPART 100 UNIT/ML ~~LOC~~ SOLN: 0.0000 [IU] | Freq: Every day | SUBCUTANEOUS | Status: DC

## 2017-06-15 MED ORDER — SODIUM CHLORIDE 0.9% FLUSH
3.0000 mL | Freq: Two times a day (BID) | INTRAVENOUS | Status: DC
  Administered 2017-06-15 (×2): 3 mL via INTRAVENOUS

## 2017-06-15 MED ORDER — SODIUM CHLORIDE 0.9 % IV SOLN
INTRAVENOUS | Status: AC
  Administered 2017-06-15: 17:00:00 via INTRAVENOUS

## 2017-06-15 SURGICAL SUPPLY — 7 items
CATH INFINITI 5FR ANG PIGTAIL (CATHETERS) ×2 IMPLANT
CATH OPTITORQUE JACKY 4.0 5F (CATHETERS) ×2 IMPLANT
DEVICE RAD TR BAND REGULAR (VASCULAR PRODUCTS) ×2 IMPLANT
GLIDESHEATH SLEND SS 6F .021 (SHEATH) ×2 IMPLANT
KIT MANI 3VAL PERCEP (MISCELLANEOUS) ×2 IMPLANT
PACK CARDIAC CATH (CUSTOM PROCEDURE TRAY) ×2 IMPLANT
WIRE ROSEN-J .035X260CM (WIRE) ×2 IMPLANT

## 2017-06-15 NOTE — Interval H&P Note (Signed)
History and Physical Interval Note:  06/15/2017 12:17 PM  Christopher Elseharles Whilden Sr.  has presented today for surgery, with the diagnosis of Unstable angina  The various methods of treatment have been discussed with the patient and family. After consideration of risks, benefits and other options for treatment, the patient has consented to  Procedure(s): LEFT HEART CATH AND CORONARY ANGIOGRAPHY (N/A) as a surgical intervention .  The patient's history has been reviewed, patient examined, no change in status, stable for surgery.  I have reviewed the patient's chart and labs.  Questions were answered to the patient's satisfaction.     Lorine BearsMuhammad Julie-Anne Torain

## 2017-06-15 NOTE — Progress Notes (Signed)
[facility] [place] [facility]   PATIENT NAME: Christopher Cummings    MR#:  [id number]  DATE OF BIRTH:  12-05-1950  SUBJECTIVE: Patient seen at bedside, status post cardiac cath showing no new blockages so no intervention is done.  However patient has hypotension.  CHIEF COMPLAINT:   Chief Complaint  Patient presents with  . Chest Pain    REVIEW OF SYSTEMS:    Review of Systems  Constitutional: Negative for chills and fever.  HENT: Negative for hearing loss.   Eyes: Negative for blurred vision, double vision and photophobia.  Respiratory: Negative for cough, hemoptysis and shortness of breath.   Cardiovascular: Negative for palpitations, orthopnea and leg swelling.  Gastrointestinal: Negative for abdominal pain, diarrhea and vomiting.  Genitourinary: Negative for dysuria and urgency.  Musculoskeletal: Negative for myalgias and neck pain.  Skin: Negative for rash.  Neurological: Negative for dizziness, focal weakness, seizures, weakness and headaches.  Psychiatric/Behavioral: Negative for memory loss. The patient does not have insomnia.     Nutrition: Tolerating Diet: Tolerating PT:      DRUG ALLERGIES:   Allergies  Allergen Reactions  . Metformin And Related Other (See Comments)    Only the regular Metformin (diarrhea)    VITALS:  Blood pressure (!) 91/54, pulse 68, temperature (!) 97.5 F (36.4 C), temperature source Oral, resp. rate 10, height 5\' 9"  (1.753 m), weight 99.9 kg (220 lb 4.8 oz), SpO2 (!) 89 %.  PHYSICAL EXAMINATION:   Physical Exam  GENERAL:  66 y.o.-year-old patient lying in the bed with no acute distress.  EYES: Pupils equal, round, reactive to light and accommodation. No scleral icterus. Extraocular muscles intact.  HEENT: Head atraumatic, normocephalic. Oropharynx and nasopharynx clear.  NECK:  Supple, no jugular venous distention. No thyroid enlargement, no tenderness.  LUNGS: Normal breath sounds bilaterally, no  wheezing, rales,rhonchi or crepitation. No use of accessory muscles of respiration.  CARDIOVASCULAR: S1, S2 normal. No murmurs, rubs, or gallops.  ABDOMEN: Soft, nontender, nondistended. Bowel sounds present. No organomegaly or mass.  EXTREMITIES: No pedal edema, cyanosis, or clubbing.  NEUROLOGIC: Cranial nerves II through XII are intact. Muscle strength 5/5 in all extremities. Sensation intact. Gait not checked.  PSYCHIATRIC: The patient is alert and oriented x 3.  SKIN: No obvious rash, lesion, or ulcer.    LABORATORY PANEL:   CBC Recent Labs  Lab 06/15/17 0753  WBC 7.0  HGB 12.1*  HCT 36.8*  PLT 191   ------------------------------------------------------------------------------------------------------------------  Chemistries  Recent Labs  Lab 06/14/17 0833  NA 135  K 3.4*  CL 102  CO2 23  GLUCOSE 181*  BUN 15  CREATININE 0.82  CALCIUM 9.3   ------------------------------------------------------------------------------------------------------------------  Cardiac Enzymes Recent Labs  Lab 06/15/17 0753  TROPONINI 0.03*   ------------------------------------------------------------------------------------------------------------------  RADIOLOGY:  Dg Chest 2 View  Result Date: 06/14/2017 CLINICAL DATA:  Chest pain EXAM: CHEST  2 VIEW COMPARISON:  05/31/2017 FINDINGS: Normal heart size. Previous median sternotomy and CABG procedure. No pleural effusion or edema. Scar like densities identified within the lateral aspect of the left lower lung zone. No superimposed airspace consolidation. IMPRESSION: 1. No acute cardiopulmonary abnormalities. Electronically Signed   By: Signa Kellaylor  Stroud M.D.   On: 06/14/2017 09:03     ASSESSMENT AND PLAN:   Active Problems:   Unstable angina (HCC)  #1. chest pain and unstable angina: Patient cardiac cath showed no new coronary artery disease, continue treatment for refractory angina.with Ranexa. 2 ,ischemic cardia myopathy with  EF 35-40%:  Euvolemic.  Hypotension so did stop her ACE inhibitors, increase the beta-blocker dose to help with tachycardia as per cardiology recommendation, monitor today and likely discharge her tomorrow while we adjust medications. 3 .hyperlipidemia: Stable Before type 2 diabetes mellitus: Continue diabetic medications. 4.  Possible sleep apnea: Patient needs sleep studies as outpatient, patient getting hypoxic episodes while sleeping.  All the records are reviewed and case discussed with Care Management/Social Workerr. Management plans discussed with the patient, family and they are in agreement.  CODE STATUS: full  TOTAL TIME TAKING CARE OF THIS PATIENT: 35minutes.   POSSIBLE D/C IN 1-2 DAYS, DEPENDING ON CLINICAL CONDITION.   Katha HammingSnehalatha Edrian Melucci M.D on 06/15/2017 at 2:49 PM  Between 7am to 6pm - Pager - [phone]  After 6pm go to [email or web address] - password [email or web address] [facility]  [facility]  Office  [phone]  CC: Primary care physician; Galen ManilaKennedy, Lauren Renee, NP

## 2017-06-15 NOTE — Consult Note (Signed)
ANTICOAGULATION CONSULT NOTE - Initial Consult  Pharmacy Consult for heparin drip Indication: chest pain/ACS  Allergies  Allergen Reactions  . Metformin And Related Other (See Comments)    Only the regular Metformin (diarrhea)    Patient Measurements: Height: 5\' 9"  (175.3 cm) Weight: 220 lb 4.8 oz (99.9 kg) IBW/kg (Calculated) : 70.7 Heparin Dosing Weight: 92.3kg  Vital Signs: Temp: 97.5 F (36.4 C) (12/14 0756) Temp Source: Oral (12/14 0756) BP: 120/66 (12/14 0756) Pulse Rate: 102 (12/14 0756)  Labs: Recent Labs    06/13/17 1506 06/14/17 0833 06/14/17 0912  06/14/17 1619 06/14/17 1909 06/14/17 2328 06/15/17 0152 06/15/17 0753  HGB  --  13.1  --   --   --   --   --   --  12.1*  HCT  --  38.8*  --   --   --   --   --   --  36.8*  PLT  --  216  --   --   --   --   --   --  191  APTT  --   --  29  --   --   --   --   --   --   LABPROT  --   --  14.0  --   --   --   --   --   --   INR  --   --  1.09  --   --   --   --   --   --   HEPARINUNFRC  --   --   --   --  0.33  --  0.31  --  0.33  CREATININE 0.78 0.82  --   --   --   --   --   --   --   TROPONINI  --  <0.03  --    < >  --  0.05*  --  0.04* 0.03*   < > = values in this interval not displayed.    Estimated Creatinine Clearance: 106.1 mL/min (by C-G formula based on SCr of 0.82 mg/dL).   Medical History: Past Medical History:  Diagnosis Date  . Aortic stenosis    a. 07/2015 s/p bioprosthetic AVR (#23 [employer]) - [place]; b. 04/2017 Echo: nl fxn'ing AoV.  Marland Kitchen. CAD (coronary artery disease)    a. 2009 PCI->D1; b. 07/2015 CABG x 2 reported (LIMA-LAD noted on cath 01/2017) [facility] [place]; c. 2017/2018 Prox/Dist LCX & D1 stenting; d. 01/2017 PCI: ISR prox/dist LCX stents (PTCA), ISR D1 (med Rx); e. 04/2017 PCI: LCX 90p ISR (CBA), 70d ISR (CBA), D1 95 ISR (CBA); f. 05/2017 PCI: LM nl, LAD 70p/m, 80d, D1 99 ISR, LCX 70p (2.25x22 Onyx DES), 30d ISR, OM3 90, RCA 10p ISR, 4653m, L->LAD ok.  . Chronic systolic CHF  (congestive heart failure) (HCC)    a. 04/2017 Echo: EF 35-40% with nl functioning bioprosthetic AoV, mild MR, nl [place].  . Colitis   . Diabetes mellitus with complication (HCC)   . Diverticulitis   . Essential hypertension   . Hyperlipidemia   . Ischemic cardiomyopathy    a. 04/2017 Echo: EF 35-40%.  . Rectal bleeding    a. 03/2017 -> f/u @ [facility].    Medications:  Scheduled:  . aspirin EC  81 mg Oral Daily  . atorvastatin  80 mg Oral QHS  . clopidogrel  75 mg Oral Daily  . digoxin  0.125 mg Oral  Daily  . isosorbide mononitrate  60 mg Oral Daily  . lisinopril  5 mg Oral QHS  . metoprolol succinate  100 mg Oral Daily  . pantoprazole  40 mg Oral Daily  . ranolazine  1,000 mg Oral BID  . sodium chloride flush  3 mL Intravenous Q12H  . sodium chloride flush  3 mL Intravenous Q12H  . spironolactone  12.5 mg Oral Daily    Assessment: Pt is a 66 year old male with a PMH of CAD, CHF, aortic stenosis, DM who presents with chest pain while in cardiac rehab. Pharmacy consulted to dose heparin drip. Baseline labs WNL. No record of home anticoag  Goal of Therapy:  Heparin level 0.3-0.7 units/ml Monitor platelets by anticoagulation protocol: Yes   Plan:  Give 4000 units bolus x 1 Start heparin infusion at 1200 units/hr Check anti-Xa level in 6 hours and daily while on heparin Continue to monitor H&H and platelets   12/13 @ 1619 1st HL just within therapeutic range at 0.33. Will increase drip to 1250u/hr and check a confirmatory lab in 6 hours. CBC with AM labs per protocol.   12/13 2330 heparin level 0.31. Continue current regimen. Recheck heparin level and CBC with tomorrow AM Labs.  12/14 0753 HL remains therapeutic at 0.33. Will continue current heparin regimen of 1250u/hr and recheck HL and CBC with AM labs.   Gardner CandleSheema M Marwa Fuhrman, PharmD, BCPS Clinical Pharmacist 06/15/2017 8:46 AM

## 2017-06-16 ENCOUNTER — Other Ambulatory Visit: Payer: Self-pay

## 2017-06-16 DIAGNOSIS — I200 Unstable angina: Secondary | ICD-10-CM | POA: Diagnosis not present

## 2017-06-16 LAB — CBC
HEMATOCRIT: 35.7 % — AB (ref 40.0–52.0)
Hemoglobin: 12.1 g/dL — ABNORMAL LOW (ref 13.0–18.0)
MCH: 27.7 pg (ref 26.0–34.0)
MCHC: 33.8 g/dL (ref 32.0–36.0)
MCV: 81.9 fL (ref 80.0–100.0)
Platelets: 190 10*3/uL (ref 150–440)
RBC: 4.35 MIL/uL — ABNORMAL LOW (ref 4.40–5.90)
RDW: 15.5 % — AB (ref 11.5–14.5)
WBC: 6.9 10*3/uL (ref 3.8–10.6)

## 2017-06-16 LAB — GLUCOSE, CAPILLARY
Glucose-Capillary: 114 mg/dL — ABNORMAL HIGH (ref 65–99)
Glucose-Capillary: 163 mg/dL — ABNORMAL HIGH (ref 65–99)

## 2017-06-16 MED ORDER — METOPROLOL SUCCINATE ER 50 MG PO TB24: ORAL_TABLET | ORAL | 0 refills | Status: DC

## 2017-06-16 NOTE — Progress Notes (Signed)
Progress Note  Patient Name: Christopher ElseCharles Farquhar Sr. Date of Encounter: 06/16/2017  Primary Cardiologist: Lorine BearsMuhammad Arida, MD   Subjective   No chest pain has been ambulatory   Inpatient Medications    Scheduled Meds: . aspirin EC  81 mg Oral Daily  . atorvastatin  80 mg Oral QHS  . clopidogrel  75 mg Oral Daily  . digoxin  0.125 mg Oral Daily  . insulin aspart  0-5 Units Subcutaneous QHS  . insulin aspart  0-9 Units Subcutaneous TID WC  . isosorbide mononitrate  60 mg Oral Daily  . metoprolol succinate  100 mg Oral Daily  . pantoprazole  40 mg Oral Daily  . ranolazine  1,000 mg Oral BID  . sodium chloride flush  3 mL Intravenous Q12H  . sodium chloride flush  3 mL Intravenous Q12H  . spironolactone  12.5 mg Oral Daily   Continuous Infusions: . sodium chloride    . sodium chloride     PRN Meds: sodium chloride, sodium chloride, acetaminophen, meclizine, nitroGLYCERIN, ondansetron (ZOFRAN) IV, sodium chloride flush, sodium chloride flush   Vital Signs    Vitals:   06/15/17 1515 06/15/17 1710 06/15/17 2150 06/16/17 0533  BP:  137/88 121/71 117/70  Pulse: 67 (!) 106 73 100  Resp: 10   18  Temp:   97.9 F (36.6 C) 98.2 F (36.8 C)  TempSrc:   Oral Oral  SpO2: 96% 98% 94% 94%  Weight:    219 lb (99.3 kg)  Height:        Intake/Output Summary (Last 24 hours) at 06/16/2017 0752 Last data filed at 06/16/2017 0152 Gross per 24 hour  Intake 243 ml  Output 970 ml  Net -727 ml   Filed Weights   06/14/17 0834 06/15/17 0549 06/16/17 0533  Weight: 224 lb (101.6 kg) 220 lb 4.8 oz (99.9 kg) 219 lb (99.3 kg)    Telemetry    NSR/ST rates 85-100 - Personally Reviewed  ECG    ST PR 224 poor R wave progression  - Personally Reviewed  Physical Exam  Pale middle aged male  GEN: No acute distress.   Neck: No JVD Cardiac: RRR, SEM through AVR no AR murmurs, rubs, or gallops.  Respiratory: Clear to auscultation bilaterally. GI: Soft, nontender, non-distended  MS: No  edema; No deformity. Neuro:  Nonfocal  Psych: Normal affect  Right radial cath sight A   Labs    Chemistry Recent Labs  Lab 06/13/17 1506 06/14/17 0833  NA 145* 135  K 4.3 3.4*  CL 105 102  CO2 19* 23  GLUCOSE 164* 181*  BUN 13 15  CREATININE 0.78 0.82  CALCIUM 9.6 9.3  GFRNONAA 95 >60  GFRAA 110 >60  ANIONGAP  --  10     Hematology Recent Labs  Lab 06/14/17 0833 06/15/17 0753 06/16/17 0430  WBC 8.1 7.0 6.9  RBC 4.73 4.43 4.35*  HGB 13.1 12.1* 12.1*  HCT 38.8* 36.8* 35.7*  MCV 82.0 83.0 81.9  MCH 27.6 27.4 27.7  MCHC 33.7 33.0 33.8  RDW 15.4* 15.4* 15.5*  PLT 216 191 190    Cardiac Enzymes Recent Labs  Lab 06/14/17 1348 06/14/17 1909 06/15/17 0152 06/15/17 0753  TROPONINI 0.04* 0.05* 0.04* 0.03*   No results for input(s): TROPIPOC in the last 168 hours.   BNPNo results for input(s): BNP, PROBNP in the last 168 hours.   DDimer No results for input(s): DDIMER in the last 168 hours.   Radiology  Dg Chest 2 View  Result Date: 06/14/2017 CLINICAL DATA:  Chest pain EXAM: CHEST  2 VIEW COMPARISON:  05/31/2017 FINDINGS: Normal heart size. Previous median sternotomy and CABG procedure. No pleural effusion or edema. Scar like densities identified within the lateral aspect of the left lower lung zone. No superimposed airspace consolidation. IMPRESSION: 1. No acute cardiopulmonary abnormalities. Electronically Signed   By: Signa Kellaylor  Stroud M.D.   On: 06/14/2017 09:03    Cardiac Studies   Cath reviewed: patent lima graft patent circumflex stent distal disease Medical Rx suggested by Dr Waldon MerlArida   Ech 04/2017 EF 35-40% AVR mean gradient 14 mmHg    Patient Profile     66 y.o. male with known history of coronary artery disease status post three-vessel CABG and bioprosthetic aortic valve replacement with subsequent stenting with multiple PCI's, ischemic cardiomyopathy, diabetes mellitus, atrial fibrillation, hypertension and hyperlipidemia. 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  The patient continued to have recurrent chest pain responding to nitroglycerin.  He went to cardiac rehab today and had an episode of dizziness and nausea followed by prolonged chest pain for about 4 hours.  Assessment & Plan    Angina:  Post cath with stable anatomy patent stent to circumflex and LIMA LAD. Does have  Distal disease. Increase Toprol to 100 mg am and 50 mg pm Hold ACE due to soft BP. Continue DAT, ranexa and nitrates. D/C home has outpatient f/u with Dr Kirke CorinArida already  CHF:  euvolemic continue aldactone Dr Kirke CorinArida will try to re institute low dose ace as outpatient  AVR:  Normal function by echo SBE prophylaxis  D/C home by primary service today   For questions or updates, please contact [facility] Please consult [email or web address] for contact info under Cardiology/STEMI.      Signed, Charlton HawsPeter Chelcy Bolda, MD  06/16/2017, 7:52 AM

## 2017-06-16 NOTE — Discharge Summary (Signed)
Christopher Wence Sr., is a 66 y.o. male  DOB October 07, 1950  MRN [id number].  Admission date:  06/14/2017  Admitting Physician  Saundra Shelling, MD  Discharge Date:  06/16/2017   Primary MD  [name], NP  Recommendations for primary care physician for things to follow:   Follow With PCP in 1 week   Admission Diagnosis  Unstable angina ([place]) [I20.0]   Discharge Diagnosis  Unstable angina ([place]) [I20.0]    Active Problems:   Unstable angina [facility])      Past Medical History:  Diagnosis Date  . Aortic stenosis    a. 07/2015 s/p bioprosthetic AVR (#23 [employer]) - [place]; b. 04/2017 Echo: nl fxn'ing AoV.  Marland Kitchen CAD (coronary artery disease)    a. 2009 PCI->D1; b. 07/2015 CABG x 2 reported (LIMA-LAD noted on cath 01/2017) [facility] - [place]; c. 2017/2018 Prox/Dist LCX & D1 stenting; d. 01/2017 PCI: ISR prox/dist LCX stents (PTCA), ISR D1 (med Rx); e. 04/2017 PCI: LCX 90p ISR (CBA), 70d ISR (CBA), D1 95 ISR (CBA); f. 05/2017 PCI: LM nl, LAD 70p/m, 80d, D1 99 ISR, LCX 70p (2.25x22 Onyx DES), 30d ISR, OM3 90, RCA 10p ISR, 17m L->LAD ok.  . Chronic systolic CHF (congestive heart failure) ([place]    a. 04/2017 Echo: EF 35-40% with nl functioning bioprosthetic AoV, mild MR, nl [place].  . Colitis   . Diabetes mellitus with complication ([place]   . Diverticulitis   . Essential hypertension   . Hyperlipidemia   . Ischemic cardiomyopathy    a. 04/2017 Echo: EF 35-40%.  . Rectal bleeding    a. 03/2017 -> f/u @ [facility].    Past Surgical History:  Procedure Laterality Date  . AORTIC VALVE REPLACEMENT    . CORONARY ARTERY BYPASS GRAFT    . CORONARY BALLOON ANGIOPLASTY N/A 04/02/2017   Procedure: CORONARY BALLOON ANGIOPLASTY;  Surgeon: KTroy Sine MD;  [place];  Service: Cardiovascular;  Laterality: N/A;  .  CORONARY STENT INTERVENTION N/A 02/26/2017   Procedure: CORONARY STENT INTERVENTION;  Surgeon: AWellington Hampshire MD;  [place];  Service: Cardiovascular;  Laterality: N/A;  . CORONARY STENT INTERVENTION N/A 05/08/2017   Procedure: CORONARY STENT INTERVENTION;  Surgeon: ENelva Bush MD;  [place] [facility];  Service: Cardiovascular;  Laterality: N/A;  . CORONARY/GRAFT ANGIOGRAPHY N/A 02/26/2017   Procedure: CORONARY/GRAFT ANGIOGRAPHY;  Surgeon: AWellington Hampshire MD;  [place];  Service: Cardiovascular;  Laterality: N/A;  . CORONARY/GRAFT ANGIOGRAPHY N/A 04/02/2017   Procedure: CORONARY/GRAFT ANGIOGRAPHY;  Surgeon: KTroy Sine MD;  [place];  Service: Cardiovascular;  Laterality: N/A;  . CORONARY/GRAFT ANGIOGRAPHY N/A 05/08/2017   Procedure: CORONARY/GRAFT ANGIOGRAPHY;  Surgeon: ENelva Bush MD;  [place] [facility];  Service: Cardiovascular;  Laterality: N/A;  . JOINT REPLACEMENT Left    KNEE  . KNEE ARTHROPLASTY  2000       History of present illness and  Hospital Course:     Kindly see H&P for history of present illness and admission details, please review complete Labs, Consult reports and Test reports for all details in brief  HPI  from the history and physical done on the day of admission 66year old male with CAD status post CABG, essential hypertension, hyperlipidemia, cardiomyopathy with EF 30-40%, diabetes mellitus type 2 admitted for chest pain.   Hospital Course  #1/chest pain with history of angina: Patient cardiac catheter yesterday  showed stable cardiac anatomy with patent stents to circumflex and also LIMA LAD.  Patient told me that he ran out of Ranexa a week ago and could not afford Ranexa.  And he takes Ranexa he says it helps him with chest pain.  And he started back on Ranexa here and also he got samples of Ranexa from Dr. Fletcher Anon.  He denies any chest pain now.  Cardiology  recommended to increase the Toprol 100 mg in the morning, 50 mg at night to help with tachycardia.  And discontinue the ACE inhibitors because of soft BP.  Patient is stable for discharge today, follow-up with Dr. Fletcher Anon as scheduled.  2.  Hyperlipidemia: Stable. 3.Diabetes mellitus type 2: Continue diabetic medication. 4.  Possible sleep apnea patient needs sleep studies as an outpatient. Chronic congestive heart failure with EF of 35-40%: Patient not in heart failure appears euvolemic now.  Discharge instructions are written and also discussed with patient about the change in his Toprol dosing.    Discharge Condition:stable   Follow UP      Discharge Instructions  and  Discharge Medications      Allergies as of 06/16/2017      Reactions   Metformin And Related Other (See Comments)   Only the regular Metformin (diarrhea)      Medication List    STOP taking these medications   lisinopril 5 MG tablet Commonly known as:  PRINIVIL,ZESTRIL     TAKE these medications   acetaminophen 325 MG tablet Commonly known as:  TYLENOL Take 2 tablets (650 mg total) by mouth every 6 (six) hours as needed for mild pain (or Fever >/= 101).   aspirin EC 81 MG tablet Take 1 tablet (81 mg total) by mouth daily.   atorvastatin 80 MG tablet Commonly known as:  LIPITOR Take 1 tablet (80 mg total) by mouth at bedtime.   blood glucose meter kit and supplies Dispense based on patient and insurance preference. Use 1 time daily as directed. (FOR ICD-9 250.00, 250.01).   Blood Pressure Cuff Misc 1 Units by Does not apply route daily.   clopidogrel 75 MG tablet Commonly known as:  PLAVIX Take 1 tablet (75 mg total) daily by mouth.   digoxin 0.125 MG tablet Commonly known as:  LANOXIN Take 0.5 tablets (0.0625 mg total) by mouth daily.   docusate sodium 100 MG capsule Commonly known as:  COLACE Take 1 capsule (100 mg total) by mouth 2 (two) times daily.   empagliflozin 25 MG Tabs  tablet Commonly known as:  JARDIANCE Take 25 mg daily by mouth.   isosorbide mononitrate 60 MG 24 hr tablet Commonly known as:  IMDUR Take 1 tablet (60 mg total) by mouth daily.   meclizine 25 MG tablet Commonly known as:  ANTIVERT Take 25 mg by mouth 3 (three) times daily as needed for dizziness.   metFORMIN 500 MG 24 hr tablet Commonly known as:  GLUCOPHAGE-XR Take 1 tablet (500 mg total) daily with breakfast by mouth.   metoprolol succinate 50 MG 24 hr tablet Commonly known as:  TOPROL XL Take Toprol XL 100 mg in the morning, 50 mg in the evening. What changed:    medication strength  how much to take  how to take this  when to take this  additional instructions   nitroGLYCERIN 0.4 MG SL tablet Commonly known as:  NITROSTAT Place 1 tablet (0.4 mg total) under the tongue every 5 (five) minutes as needed for chest pain.  pantoprazole 40 MG tablet Commonly known as:  PROTONIX Take 1 tablet (40 mg total) daily by mouth.   ranolazine 1000 MG SR tablet Commonly known as:  RANEXA Take 1 tablet (1,000 mg total) by mouth 2 (two) times daily.   spironolactone 25 MG tablet Commonly known as:  ALDACTONE Take 0.5 tablets (12.5 mg total) daily by mouth.         Diet and Activity recommendation: See Discharge Instructions above   Consults obtained - cardiology   Major procedures and Radiology Reports - PLEASE review detailed and final reports for all details, in brief -      Dg Chest 2 View  Result Date: 06/14/2017 CLINICAL DATA:  Chest pain EXAM: CHEST  2 VIEW COMPARISON:  05/31/2017 FINDINGS: Normal heart size. Previous median sternotomy and CABG procedure. No pleural effusion or edema. Scar like densities identified within the lateral aspect of the left lower lung zone. No superimposed airspace consolidation. IMPRESSION: 1. No acute cardiopulmonary abnormalities. Electronically Signed   By: Kerby Moors M.D.   On: 06/14/2017 09:03   Ct Head Wo  Contrast  Result Date: 05/31/2017 CLINICAL DATA:  Left-sided facial numbness. EXAM: CT HEAD WITHOUT CONTRAST TECHNIQUE: Contiguous axial images were obtained from the base of the skull through the vertex without intravenous contrast. COMPARISON:  CT head dated May 07, 2017. FINDINGS: Brain: No evidence of acute infarction, hemorrhage, hydrocephalus, extra-axial collection or mass lesion/mass effect. Stable mild cerebral atrophy. Vascular: No hyperdense vessel or unexpected calcification. Skull: Normal. Negative for fracture or focal lesion. Sinuses/Orbits: No acute finding. Other: None. IMPRESSION: No acute intracranial abnormality. Electronically Signed   By: Titus Dubin M.D.   On: 05/31/2017 11:46   Dg Chest Port 1 View  Result Date: 05/31/2017 CLINICAL DATA:  66 year old male with a history of left-sided chest tightness EXAM: PORTABLE CHEST 1 VIEW COMPARISON:  05/07/2017 FINDINGS: Cardiomediastinal silhouette unchanged in size and contour. Surgical changes of median sternotomy and valve repair. No central vascular congestion.  No interlobular septal thickening. Coarsened interstitial markings bilaterally. No pneumothorax or pleural effusion IMPRESSION: Chronic lung changes without evidence of superimposed acute cardiopulmonary disease. Surgical changes of median sternotomy and valve repair Electronically Signed   By: Corrie Mckusick D.O.   On: 05/31/2017 09:23    Micro Results    No results found for this or any previous visit (from the past 240 hour(s)).     Today   Subjective:   Christopher Cummings today has no headache,no chest abdominal pain,no new weakness tingling or numbness, feels much better wants to go home today.   Objective:   Blood pressure 115/67, pulse (!) 104, temperature 98.2 F (36.8 C), temperature source Oral, resp. rate 18, height 5' 9"  (1.753 m), weight 99.3 kg (219 lb), SpO2 95 %.   Intake/Output Summary (Last 24 hours) at 06/16/2017 1134 Last data filed at  06/16/2017 1018 Gross per 24 hour  Intake 483 ml  Output 770 ml  Net -287 ml    Exam Awake Alert, Oriented x 3, No new F.N deficits, Normal affect [place].AT,PERRAL Supple Neck,No JVD, No cervical lymphadenopathy appriciated.  Symmetrical Chest wall movement, Good air movement bilaterally, CTAB RRR,No Gallops,Rubs or new Murmurs, No Parasternal Heave +ve B.Sounds, Abd Soft, Non tender, No organomegaly appriciated, No rebound -guarding or rigidity. No Cyanosis, Clubbing or edema, No new Rash or bruise  Data Review   CBC w Diff:  Lab Results  Component Value Date   WBC 6.9 06/16/2017   HGB 12.1 (L)  06/16/2017   HGB 13.7 05/21/2017   HCT 35.7 (L) 06/16/2017   HCT 41.9 05/21/2017   PLT 190 06/16/2017   PLT 246 05/21/2017   LYMPHOPCT 18 06/06/2017   MONOPCT 10 06/06/2017   EOSPCT 2 06/06/2017   BASOPCT 1 06/06/2017    CMP:  Lab Results  Component Value Date   NA 135 06/14/2017   NA 145 (H) 06/13/2017   K 3.4 (L) 06/14/2017   CL 102 06/14/2017   CO2 23 06/14/2017   BUN 15 06/14/2017   BUN 13 06/13/2017   CREATININE 0.82 06/14/2017   PROT 7.2 06/06/2017   ALBUMIN 4.0 06/06/2017   BILITOT 0.5 06/06/2017   ALKPHOS 79 06/06/2017   AST 26 06/06/2017   ALT 22 06/06/2017  .   Total Time in preparing paper work, data evaluation and todays exam - 35 minutes  Epifanio Lesches M.D on 06/16/2017 at 11:34 AM    Note: This dictation was prepared with Dragon dictation along with smaller phrase technology. Any transcriptional errors that result from this process are unintentional.

## 2017-06-16 NOTE — Progress Notes (Addendum)
Discharge instructions explained to pt/ verbalized an understanding/ iv and tele removed/ ambulated around nursing station/ tolerated well/ will transport off unit via wheelchair 

## 2017-06-18 ENCOUNTER — Encounter: Payer: Self-pay | Admitting: Cardiovascular Disease

## 2017-06-18 ENCOUNTER — Telehealth: Payer: Self-pay | Admitting: Cardiovascular Disease

## 2017-06-18 MED ORDER — METOPROLOL SUCCINATE ER 50 MG PO TB24: ORAL_TABLET | ORAL | Status: DC

## 2017-06-18 NOTE — Telephone Encounter (Signed)
Patient calling back, has not heard from nurse yet Still having issues and would like to speak with nurse  Please call

## 2017-06-18 NOTE — Telephone Encounter (Signed)
Spoke with the patient. He reports that he woke up dizzy this morning. He reports feeling like he was "punched in the nose" and he is getting ready to have a nose bleed.  The patient reports that dizziness is worse with ambulation, but he is also having this at rest.  Per the patient, his BP has been 78-116/60-88 this afternoon.  HR's - 94 bpm. He states he felt fine all day yesterday with a blood pressure of 128/78, HR was in the 90's.  The patient confirms he is off lisinopril per his hospital discharge. He is taking: -metoprolol succinate 100 mg in the AM & 50 mg in the PM - isosorbide MN 60 mg daily - aldactone 12.5 mg daily  Reviewed with Eula Listenyan Dunn, PA- orders received, to have the patient: 1) hold toprol tonight 2) if AM SBP >100, take metoprolol 50 mg 3) if PM SBP > 100, take metoprolol 50 mg 4) separate toprol, aldactone, isosorbide MN by about an hour each 5) hydrate  I called and spoke with the patient's sister, Corrie DandyMary, as the patient is sitting on the couch- she is aware of the above instructions and verbalizes understanding. I have advised her to have the patient call back prior to Thursday if no change in symptoms.

## 2017-06-18 NOTE — Telephone Encounter (Signed)
Pt states about an hour ago he started feeling dizzy and then gets short of breath. Denies any other symptoms, chest pain, nausea, vomiting. States his BP 116/71 HR 90

## 2017-06-21 ENCOUNTER — Ambulatory Visit (INDEPENDENT_AMBULATORY_CARE_PROVIDER_SITE_OTHER): Payer: BLUE CROSS/BLUE SHIELD | Admitting: Nurse Practitioner

## 2017-06-21 ENCOUNTER — Encounter: Payer: Self-pay | Admitting: Nurse Practitioner

## 2017-06-21 VITALS — BP 100/60 | HR 84 | Ht 69.0 in | Wt 219.5 lb

## 2017-06-21 DIAGNOSIS — I255 Ischemic cardiomyopathy: Secondary | ICD-10-CM

## 2017-06-21 DIAGNOSIS — I25119 Atherosclerotic heart disease of native coronary artery with unspecified angina pectoris: Secondary | ICD-10-CM | POA: Diagnosis not present

## 2017-06-21 DIAGNOSIS — I5022 Chronic systolic (congestive) heart failure: Secondary | ICD-10-CM | POA: Diagnosis not present

## 2017-06-21 DIAGNOSIS — I10 Essential (primary) hypertension: Secondary | ICD-10-CM | POA: Diagnosis not present

## 2017-06-21 DIAGNOSIS — E785 Hyperlipidemia, unspecified: Secondary | ICD-10-CM

## 2017-06-21 MED ORDER — METOPROLOL SUCCINATE ER 50 MG PO TB24: ORAL_TABLET | ORAL | 3 refills | Status: DC

## 2017-06-21 NOTE — Telephone Encounter (Signed)
Per Pennie RushingSeth, Pharmacist at [facility], pt has $0 copay on medications. Will make Ward Givenshris Berge, NP, aware as pt in office today for appt.

## 2017-06-21 NOTE — Patient Instructions (Signed)
Medication Instructions:  Your physician has recommended you make the following change in your medication:  Take metoprolol 50mg  ONCE daily.   Labwork: none  Testing/Procedures: none  Follow-Up: Your physician recommends that you schedule a follow-up appointment in: 4-6 weeks with Dr. Kirke CorinArida.    Any Other Special Instructions Will Be Listed Below (If Applicable).     If you need a refill on your cardiac medications before your next appointment, please call your pharmacy.

## 2017-06-21 NOTE — Progress Notes (Signed)
Office Visit    Patient Name: Christopher Taillon Sr. 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  Past Medical History    Past Medical History:  Diagnosis Date  . Aortic stenosis    a. 07/2015 s/p bioprosthetic AVR (#23 [employer]) - [place]; b. 04/2017 Echo: nl fxn'ing AoV.  Marland Kitchen CAD (coronary artery disease)    a. 2009 PCI->D1; b. 07/2015 CABG x 2 reported (LIMA-LAD noted on cath 01/2017) [facility] - [place]; c. 2017/2018 Prox/Dist LCX & D1 stenting; d. 01/2017 PCI: ISR prox/dist LCX stents (PTCA), ISR D1 (med Rx); e. 04/2017 PCI: CBA for ISR- LCX 90p, 70d, D1 95; f. 05/2017 PCI: D1 99 ISR, LCX 70p (2.25x22 [employer] DES); g. 06/2017 Cath: Patent LCX and RCA stents. D1 100 ISR->Med Rx.  . Chronic systolic CHF (congestive heart failure) ([place])    a. 04/2017 Echo: EF 35-40% with nl functioning bioprosthetic AoV, mild MR, nl PA.  . Colitis   . Diabetes mellitus with complication ([place])   . Diverticulitis   . Essential hypertension   . Hyperlipidemia   . Ischemic cardiomyopathy    a. 04/2017 Echo: EF 35-40%.  . Rectal bleeding    a. 03/2017 -> f/u @ [facility].   Past Surgical History:  Procedure Laterality Date  . AORTIC VALVE REPLACEMENT    . CORONARY ARTERY BYPASS GRAFT    . CORONARY BALLOON ANGIOPLASTY N/A 04/02/2017   Procedure: CORONARY BALLOON ANGIOPLASTY;  Surgeon: Troy Sine, MD;  Location: [place] [facility];  Service: Cardiovascular;  Laterality: N/A;  . CORONARY STENT INTERVENTION N/A  02/26/2017   Procedure: CORONARY STENT INTERVENTION;  Surgeon: Wellington Hampshire, MD;  Location: [place] [facility];  Service: Cardiovascular;  Laterality: N/A;  . CORONARY STENT INTERVENTION N/A 05/08/2017   Procedure: CORONARY STENT INTERVENTION;  Surgeon: Nelva Bush, MD;  Location: [place] [facility];  Service: Cardiovascular;  Laterality: N/A;  . CORONARY/GRAFT ANGIOGRAPHY N/A 02/26/2017   Procedure: CORONARY/GRAFT ANGIOGRAPHY;  Surgeon: Wellington Hampshire, MD;  Location: [place] [facility];  Service: Cardiovascular;  Laterality: N/A;  . CORONARY/GRAFT ANGIOGRAPHY N/A 04/02/2017   Procedure: CORONARY/GRAFT ANGIOGRAPHY;  Surgeon: Troy Sine, MD;  Location: [place] [facility];  Service: Cardiovascular;  Laterality: N/A;  . CORONARY/GRAFT ANGIOGRAPHY N/A 05/08/2017   Procedure: CORONARY/GRAFT ANGIOGRAPHY;  Surgeon: Nelva Bush, MD;  Location: [place] [facility];  Service: Cardiovascular;  Laterality: N/A;  . JOINT REPLACEMENT Left    KNEE  . KNEE ARTHROPLASTY  2000  . LEFT HEART CATH AND CORONARY ANGIOGRAPHY N/A 06/15/2017   Procedure: LEFT HEART CATH AND CORONARY ANGIOGRAPHY;  Surgeon: Wellington Hampshire, MD;  Location: [place] [facility];  Service: Cardiovascular;  Laterality: N/A;    Allergies  Allergies  Allergen Reactions  . Metformin And Related Other (See Comments)    Only the regular Metformin (diarrhea)    History of Present Illness    66 year old male with the above complex past medical history including CAD status post stenting of the diagonal in 2009 followed by CABG x2 performed in [place]  in January 2017.  Following that, he required stents to the right coronary artery and proximal and distal left circumflex.  He is unaware of the details as to why he required stenting following CABG but based on subsequent catheterizations, it appears that a vein graft had gone down.  Other history includes aortic stenosis status post bioprosthetic aortic valve  placement at the time of his CABG in January 2017, hypertension, hyper lipidemia, diabetes, and intermittent rectal bleeding.  As previously noted, he has had multiple hospitalization related to chest pain with finding of in-stent restenosis of the diagonal and proximal and distal left circumflex in August 2018 with PTCA of the proximal and distal left circumflex.  An initial attempt was made at medical therapy of the diagonal branch however, in October, he required cutting balloon angioplasty within the diagonal stent as well as in the proximal and distal left circumflex stents in the setting of recurrent angina.  In late October, he was readmitted with chest pain and underwent stress testing which was nonischemic.  In November, he was admitted with recurrent chest pain and non-STEMI and was again found to have severe narrowing/in-stent restenosis in the proximal left circumflex and diagonal.  The proximal left circumflex was treated with a drug-eluting stent and medical therapy was recommended for the diagonal.  He was placed on Ranexa therapy but could not afford it.  I saw him in clinic on December 12 at which time he reported episodes of chest pain occurring at night.  We were able to get him Ranexa samples and also titrated his isosorbide mononitrate.  Unfortunately, he had recurrent symptoms prompting him to present to [facility] on December 13.  He ruled out.  He underwent diagnostic catheterization showing total occlusion of the diagonal stent with patency of the proximal and distal circumflex stents, as well as patency of the right coronary artery stent.  Medical therapy was recommended and he was subsequently discharged.  Since discharge, he is noted a mild/low level relatively constant chest discomfort that is not any worse with activity.  He has been taking his Ranexa samples and does feel as though they are making a difference.  Overall, he is feeling much better than how he was feeling a week  ago.  He has noted some dyspnea on exertion and has not been walking as frequently as he was prior to his most recent hospitalization.  He denies palpitations, PND, orthopnea, dizziness, syncope, edema, or early satiety.  Home Medications    Prior to Admission medications   Medication Sig Start Date End Date Taking? Authorizing Provider  acetaminophen (TYLENOL) 325 MG tablet Take 2 tablets (650 mg total) by mouth every 6 (six) hours as needed for mild pain (or Fever >/= 101). 04/26/17  Yes Gouru, Illene Silver, MD  aspirin EC 81 MG tablet Take 1 tablet (81 mg total) by mouth daily. 06/13/17  Yes Theora Gianotti, NP  atorvastatin (LIPITOR) 80 MG tablet Take 1 tablet (80 mg total) by mouth at bedtime. 04/30/17  Yes [name], NP  blood glucose meter kit and supplies Dispense based on patient and insurance preference. Use 1 time daily as directed. (FOR ICD-9 250.00, 250.01). 04/30/17  Yes [name], NP  Blood Pressure Monitoring (BLOOD PRESSURE CUFF) MISC 1 Units by Does not apply route daily. 04/30/17  Yes [name], NP  clopidogrel (PLAVIX) 75 MG tablet Take 1 tablet (75 mg total) daily by mouth. 05/21/17  Yes Dunn, Areta Haber, PA-C  digoxin (  LANOXIN) 0.125 MG tablet Take 0.5 tablets (0.0625 mg total) by mouth daily. 06/05/17 06/05/18 Yes Dunn, Areta Haber, PA-C  docusate sodium (COLACE) 100 MG capsule Take 1 capsule (100 mg total) by mouth 2 (two) times daily. 06/01/17  Yes Gladstone Lighter, MD  empagliflozin (JARDIANCE) 25 MG TABS tablet Take 25 mg daily by mouth. 05/15/17  Yes [name], NP  isosorbide mononitrate (IMDUR) 60 MG 24 hr tablet Take 1 tablet (60 mg total) by mouth daily. 06/13/17 09/11/17 Yes Theora Gianotti, NP  meclizine (ANTIVERT) 25 MG tablet Take 25 mg by mouth 3 (three) times daily as needed for dizziness.   Yes [provider]  metFORMIN (GLUCOPHAGE-XR) 500 MG 24 hr tablet Take 1 tablet (500 mg total) daily with  breakfast by mouth. 05/15/17 05/15/18 Yes [name], NP  metoprolol succinate (TOPROL-XL) 50 MG 24 hr tablet Take one tablet (50 mg) daily if systolic blood pressure is >100 06/21/17  Yes Theora Gianotti, NP  nitroGLYCERIN (NITROSTAT) 0.4 MG SL tablet Place 1 tablet (0.4 mg total) under the tongue every 5 (five) minutes as needed for chest pain. 04/16/17  Yes Wellington Hampshire, MD  pantoprazole (PROTONIX) 40 MG tablet Take 1 tablet (40 mg total) daily by mouth. 05/21/17  Yes Dunn, Areta Haber, PA-C  ranolazine (RANEXA) 1000 MG SR tablet Take 1 tablet (1,000 mg total) by mouth 2 (two) times daily. 06/13/17  Yes Theora Gianotti, NP  spironolactone (ALDACTONE) 25 MG tablet Take 0.5 tablets (12.5 mg total) daily by mouth. 05/21/17 08/19/17 Yes Dunn, Areta Haber, PA-C    Review of Systems    Mild somewhat constant chest discomfort.  He denies dyspnea, palpitations, PND, orthopnea, dizziness, syncope, edema, or early satiety.  All other systems reviewed and are otherwise negative except as noted above.  Physical Exam    VS:  BP 100/60 (BP Location: Left Arm, Patient Position: Sitting, Cuff Size: Normal)   Pulse 84   Ht _0  (1.753 m)   Wt 219 lb 8 oz (99.6 kg)   BMI 32.41 kg/m  , BMI Body mass index is 32.41 kg/m. GEN: Well nourished, well developed, in no acute distress.  HEENT: normal.  Neck: Supple, no JVD, carotid bruits, or masses. Cardiac: Irregularly irregular, 2/6 systolic murmur at the upper sternal border, no rubs, or gallops. No clubbing, cyanosis, edema.  Radials/DP/PT 2+ and equal bilaterally.  Right wrist catheterization site without bleeding, bruit, or hematoma. Respiratory:  Respirations regular and unlabored, clear to auscultation bilaterally. GI: Soft, nontender, nondistended, BS + x 4. MS: no deformity or atrophy. Skin: warm and dry, no rash. Neuro:  Strength and sensation are intact. Psych: Normal affect.  Accessory Clinical Findings    ECG  -atrial fibrillation, 84, leftward axis, old anterior infarct.  Assessment & Plan    1.  Coronary artery disease: Patient with extensive cardiac history as outlined above.  He was recently readmitted secondary to chest pain and ruled out.  Catheterization revealed patency of the proximal circumflex and distal circumflex as well as right coronary artery stents.  The stent in the diagonal is totally occluded.  It was previously identified as being subtotally occluded.  He is being medically managed.  He is currently on aspirin, statin, Plavix, beta-blocker, nitrate, and Ranexa therapy.  He is tolerating this well and overall is improved.  He does note a mild/low level somewhat constant chest discomfort that does not change with activity.  We discussed though given recent normal cardiac  markers and reassuring catheterization, that his discomfort is likely noncardiac.  No changes to medications today.  He will continue with cardiac rehabilitation.  2.  Essential hypertension: Blood pressure on the low side.  He is currently stable.  3.  Hyperlipidemia: LDL 36 in October.  LFTs normal limits earlier this month.  Continue statin therapy.  4.  Ischemic cardia myopathy/HFrEF: EF 35-40%.  Euvolemic on exam.  Continue beta-blocker, ACE inhibitor, and spinal lactone.  5.  History of rectal bleeding: Recent stable H&H.  No recurrence of dark stools.  Next  6.  Type 2 diabetes mellitus: A1c 9.3 November.  This is followed by primary care at [facility].  7.  Aortic stenosis: Status post bioprosthetic AVR gender 2017 normal functioning valve by echo in October.  8.  Permanent atrial fibrillation: Rate controlled on beta-blocker and digoxin.  No oral anticoagulation the need for dual antiplatelet therapy and rectal bleeding in September.  9.  Disposition: Follow-up in 4-6 weeks.   Murray Hodgkins, NP 06/21/2017, 11:44 AM

## 2017-06-25 ENCOUNTER — Other Ambulatory Visit: Payer: Self-pay

## 2017-06-25 ENCOUNTER — Emergency Department
Admission: EM | Admit: 2017-06-25 | Discharge: 2017-06-25 | Disposition: A | Discharge status: Home / Self Care | Payer: BLUE CROSS/BLUE SHIELD | Attending: Emergency Medicine | Admitting: Emergency Medicine

## 2017-06-25 ENCOUNTER — Telehealth: Payer: Self-pay | Admitting: Cardiology

## 2017-06-25 ENCOUNTER — Emergency Department: Payer: BLUE CROSS/BLUE SHIELD

## 2017-06-25 ENCOUNTER — Encounter: Payer: Self-pay | Admitting: Emergency Medicine

## 2017-06-25 DIAGNOSIS — Z951 Presence of aortocoronary bypass graft: Secondary | ICD-10-CM | POA: Insufficient documentation

## 2017-06-25 DIAGNOSIS — R110 Nausea: Secondary | ICD-10-CM

## 2017-06-25 DIAGNOSIS — Z7982 Long term (current) use of aspirin: Secondary | ICD-10-CM | POA: Diagnosis not present

## 2017-06-25 DIAGNOSIS — I252 Old myocardial infarction: Secondary | ICD-10-CM | POA: Diagnosis not present

## 2017-06-25 DIAGNOSIS — Z7984 Long term (current) use of oral hypoglycemic drugs: Secondary | ICD-10-CM | POA: Diagnosis not present

## 2017-06-25 DIAGNOSIS — N3001 Acute cystitis with hematuria: Secondary | ICD-10-CM | POA: Diagnosis not present

## 2017-06-25 DIAGNOSIS — I5022 Chronic systolic (congestive) heart failure: Secondary | ICD-10-CM | POA: Insufficient documentation

## 2017-06-25 DIAGNOSIS — I2510 Atherosclerotic heart disease of native coronary artery without angina pectoris: Secondary | ICD-10-CM | POA: Diagnosis not present

## 2017-06-25 DIAGNOSIS — Z952 Presence of prosthetic heart valve: Secondary | ICD-10-CM | POA: Insufficient documentation

## 2017-06-25 DIAGNOSIS — E119 Type 2 diabetes mellitus without complications: Secondary | ICD-10-CM | POA: Insufficient documentation

## 2017-06-25 DIAGNOSIS — R531 Weakness: Secondary | ICD-10-CM

## 2017-06-25 DIAGNOSIS — Z96652 Presence of left artificial knee joint: Secondary | ICD-10-CM | POA: Insufficient documentation

## 2017-06-25 DIAGNOSIS — I110 Hypertensive heart disease with heart failure: Secondary | ICD-10-CM | POA: Diagnosis not present

## 2017-06-25 DIAGNOSIS — M6281 Muscle weakness (generalized): Secondary | ICD-10-CM | POA: Diagnosis present

## 2017-06-25 DIAGNOSIS — Z7902 Long term (current) use of antithrombotics/antiplatelets: Secondary | ICD-10-CM | POA: Insufficient documentation

## 2017-06-25 LAB — URINALYSIS, COMPLETE (UACMP) WITH MICROSCOPIC
Bilirubin Urine: NEGATIVE
Glucose, UA: >=500 mg/dL — AB
Hgb urine dipstick: NEGATIVE
Ketones, ur: NEGATIVE mg/dL
LEUKOCYTES UA: NEGATIVE
Nitrite: POSITIVE — AB
PH: 5 (ref 5.0–8.0)
Protein, ur: NEGATIVE mg/dL
SPECIFIC GRAVITY, URINE: 1.033 — AB (ref 1.005–1.030)
SQUAMOUS EPITHELIAL / LPF: NONE SEEN

## 2017-06-25 LAB — BASIC METABOLIC PANEL
Anion gap: 11 (ref 5–15)
BUN: 15 mg/dL (ref 6–20)
CHLORIDE: 103 mmol/L (ref 101–111)
CO2: 22 mmol/L (ref 22–32)
CREATININE: 0.84 mg/dL (ref 0.61–1.24)
Calcium: 9 mg/dL (ref 8.9–10.3)
GFR calc non Af Amer: >60 mL/min (ref >60)
Glucose, Bld: 207 mg/dL — ABNORMAL HIGH (ref 65–99)
POTASSIUM: 3.7 mmol/L (ref 3.5–5.1)
SODIUM: 136 mmol/L (ref 135–145)

## 2017-06-25 LAB — CBC
HCT: 40.5 % (ref 40.0–52.0)
Hemoglobin: 13.4 g/dL (ref 13.0–18.0)
MCH: 27.2 pg (ref 26.0–34.0)
MCHC: 33.1 g/dL (ref 32.0–36.0)
MCV: 82.3 fL (ref 80.0–100.0)
PLATELETS: 254 10*3/uL (ref 150–440)
RBC: 4.92 MIL/uL (ref 4.40–5.90)
RDW: 15.3 % — ABNORMAL HIGH (ref 11.5–14.5)
WBC: 10.3 10*3/uL (ref 3.8–10.6)

## 2017-06-25 LAB — TROPONIN I
TROPONIN I: 0.03 ng/mL — AB (ref <0.03)
Troponin I: <0.03 ng/mL (ref <0.03)

## 2017-06-25 LAB — INFLUENZA PANEL BY PCR (TYPE A & B)
INFLAPCR: NEGATIVE
INFLBPCR: NEGATIVE

## 2017-06-25 LAB — DIGOXIN LEVEL: DIGOXIN LVL: 0.6 ng/mL — AB (ref 0.8–2.0)

## 2017-06-25 MED ORDER — SODIUM CHLORIDE 0.9 % IV BOLUS (SEPSIS): 500.0000 mL | Freq: Once | INTRAVENOUS | Status: DC

## 2017-06-25 MED ORDER — ONDANSETRON 4 MG PO TBDP: 4.0000 mg | ORAL_TABLET | Freq: Three times a day (TID) | ORAL | 0 refills | Status: AC | PRN

## 2017-06-25 MED ORDER — ONDANSETRON HCL 4 MG/2ML IJ SOLN
4.0000 mg | Freq: Once | INTRAMUSCULAR | Status: AC
  Administered 2017-06-25: 4 mg via INTRAVENOUS
  Filled 2017-06-25: qty 2

## 2017-06-25 MED ORDER — CEPHALEXIN 500 MG PO CAPS: 500.0000 mg | ORAL_CAPSULE | Freq: Three times a day (TID) | ORAL | 0 refills | Status: AC

## 2017-06-25 MED ORDER — CEFTRIAXONE SODIUM IN DEXTROSE 20 MG/ML IV SOLN
1.0000 g | Freq: Once | INTRAVENOUS | Status: AC
  Administered 2017-06-25: 1 g via INTRAVENOUS
  Filled 2017-06-25: qty 50

## 2017-06-25 MED ORDER — SODIUM CHLORIDE 0.9 % IV BOLUS (SEPSIS)
250.0000 mL | Freq: Once | INTRAVENOUS | Status: AC
  Administered 2017-06-25: 250 mL via INTRAVENOUS

## 2017-06-25 NOTE — Discharge Instructions (Addendum)

## 2017-06-25 NOTE — ED Notes (Signed)
Pt discharged to home.  Family member driving.  Discharge instructions reviewed.  Verbalized understanding.  No questions or concerns at this time.  Teach back verified.  Pt in NAD.  No items left in ED.   

## 2017-06-25 NOTE — Telephone Encounter (Signed)
Pt's sister called stating her brother was weak, hot and cold, rubbery legs, felt very bad but BP 118 systolic,  Instructed to come to ER.

## 2017-06-25 NOTE — ED Provider Notes (Signed)
Patient's repeat troponin was normal.  Patient resting comfortably, reports he is comfortable with plan for discharge and treatment for probable urinary tract infection.  He has received Rocephin, currently resting comfortably.  Agreeable with return precautions and treatment.  He is alert well appearing in no distress.  Vitals:   06/25/17 2130 06/25/17 2200  BP: 124/74 132/72  Pulse: 71 68  Resp: 16 (!) 26  Temp:    SpO2: 90% 94%      Sharyn CreamerQuale, Viraj Liby, MD 06/25/17 2308

## 2017-06-25 NOTE — ED Notes (Signed)
Date and time results received: 06/25/17 1742  Test: Trop Critical Value: 0.03  Name of Provider Notified: Dr. Don PerkingVeronese  Orders Received? Or Actions Taken?: Acknowledged

## 2017-06-25 NOTE — ED Provider Notes (Addendum)
[facility] Emergency Department Provider Note  ____________________________________________  Time seen: Approximately 4:43 PM  I have reviewed the triage vital signs and the nursing notes.   HISTORY  Chief Complaint Nausea and Weakness   HPI Christopher Nott Sr. is a 66 y.o. male with h/o CHF (EF 35-40%), CAD on Plavix, aortic stenosis, DM, afib on Digoxin, HTN, HLD who presents for evaluation of nausea and generalized weakness. Patient reports that he was in his usual state of health yesterday evening when he went to bed. This morning he woke up feeling nauseated and very weak. Has not been able to get up from the bed.Reports his symptoms have been severe and constant since this am.Had toast with meds this am but has not eaten anything else. No vomiting. He feels that he will soon have diarrhea but has not had it yet. No melena. patient reports that he feels like he has the flu. No sore throat. no shortness of breath, no chest pain,no cough, no fever, no abdominal pain, no dysuria, no hematuria, no dizziness, no vertigo, no HA.  Past Medical History:  Diagnosis Date  . Aortic stenosis    a. 07/2015 s/p bioprosthetic AVR (#23 [employer]) - [place]; b. 04/2017 Echo: nl fxn'ing AoV.  Marland Kitchen CAD (coronary artery disease)    a. 2009 PCI->D1; b. 07/2015 CABG x 2 reported (LIMA-LAD noted on cath 01/2017) [facility] - [place]; c. 2017/2018 Prox/Dist LCX & D1 stenting; d. 01/2017 PCI: ISR prox/dist LCX stents (PTCA), ISR D1 (med Rx); e. 04/2017 PCI: CBA for ISR- LCX 90p, 70d, D1 95; f. 05/2017 PCI: D1 99 ISR, LCX 70p (2.25x22 [employer] DES); g. 06/2017 Cath: Patent LCX and RCA stents. D1 100 ISR->Med Rx.  . Chronic systolic CHF (congestive heart failure) ([place])    a. 04/2017 Echo: EF 35-40% with nl functioning bioprosthetic AoV, mild MR, nl PA.  . Colitis   . Diabetes mellitus with complication ([place])   . Diverticulitis   . Essential hypertension   . Hyperlipidemia   . Ischemic  cardiomyopathy    a. 04/2017 Echo: EF 35-40%.  . Rectal bleeding    a. 03/2017 -> f/u @ [facility].    Patient Active Problem List   Diagnosis Date Noted  . Chest tightness 05/31/2017  . Gastroesophageal reflux disease 05/29/2017  . Chronic systolic heart failure ([place]) 05/12/2017  . NSTEMI (non-ST elevated myocardial infarction) ([place]) 05/07/2017  . H/O prosthetic aortic valve replacement 04/01/2017  . Unstable angina ([place]) 03/31/2017  . HTN (hypertension) 03/12/2017  . Diabetes ([place]) 03/12/2017  . CAD (coronary artery disease) 03/12/2017  . History of non-ST elevation myocardial infarction (NSTEMI) 02/24/2017    Past Surgical History:  Procedure Laterality Date  . AORTIC VALVE REPLACEMENT    . CORONARY ARTERY BYPASS GRAFT    . CORONARY BALLOON ANGIOPLASTY N/A 04/02/2017   Procedure: CORONARY BALLOON ANGIOPLASTY;  Surgeon: Troy Sine, MD;  Location: [place] CV LAB;  Service: Cardiovascular;  Laterality: N/A;  . CORONARY STENT INTERVENTION N/A 02/26/2017   Procedure: CORONARY STENT INTERVENTION;  Surgeon: Wellington Hampshire, MD;  Location: [place] CV LAB;  Service: Cardiovascular;  Laterality: N/A;  . CORONARY STENT INTERVENTION N/A 05/08/2017   Procedure: CORONARY STENT INTERVENTION;  Surgeon: Nelva Bush, MD;  Location: [place] CV LAB;  Service: Cardiovascular;  Laterality: N/A;  . CORONARY/GRAFT ANGIOGRAPHY N/A 02/26/2017   Procedure: CORONARY/GRAFT ANGIOGRAPHY;  Surgeon: Wellington Hampshire, MD;  Location: [place] CV LAB;  Service:  Cardiovascular;  Laterality: N/A;  . CORONARY/GRAFT ANGIOGRAPHY N/A 04/02/2017   Procedure: CORONARY/GRAFT ANGIOGRAPHY;  Surgeon: Troy Sine, MD;  Location: [place] CV LAB;  Service: Cardiovascular;  Laterality: N/A;  . CORONARY/GRAFT ANGIOGRAPHY N/A 05/08/2017   Procedure: CORONARY/GRAFT ANGIOGRAPHY;  Surgeon: Nelva Bush, MD;  Location: [place] CV LAB;  Service: Cardiovascular;  Laterality: N/A;  . JOINT  REPLACEMENT Left    KNEE  . KNEE ARTHROPLASTY  2000  . LEFT HEART CATH AND CORONARY ANGIOGRAPHY N/A 06/15/2017   Procedure: LEFT HEART CATH AND CORONARY ANGIOGRAPHY;  Surgeon: Wellington Hampshire, MD;  Location: [place] CV LAB;  Service: Cardiovascular;  Laterality: N/A;    Prior to Admission medications   Medication Sig Start Date End Date Taking? Authorizing Provider  acetaminophen (TYLENOL) 325 MG tablet Take 2 tablets (650 mg total) by mouth every 6 (six) hours as needed for mild pain (or Fever >/= 101). 04/26/17   Nicholes Mango, MD  aspirin EC 81 MG tablet Take 1 tablet (81 mg total) by mouth daily. 06/13/17   Theora Gianotti, NP  atorvastatin (LIPITOR) 80 MG tablet Take 1 tablet (80 mg total) by mouth at bedtime. 04/30/17   [name], NP  blood glucose meter kit and supplies Dispense based on patient and insurance preference. Use 1 time daily as directed. (FOR ICD-9 250.00, 250.01). 04/30/17   [name], NP  Blood Pressure Monitoring (BLOOD PRESSURE CUFF) MISC 1 Units by Does not apply route daily. 04/30/17   [name], NP  cephALEXin (KEFLEX) 500 MG capsule Take 1 capsule (500 mg total) by mouth 3 (three) times daily for 7 days. 06/25/17 07/02/17  Rudene Re, MD  clopidogrel (PLAVIX) 75 MG tablet Take 1 tablet (75 mg total) daily by mouth. 05/21/17   Dunn, Areta Haber, PA-C  digoxin (LANOXIN) 0.125 MG tablet Take 0.5 tablets (0.0625 mg total) by mouth daily. 06/05/17 06/05/18  Rise Mu, PA-C  docusate sodium (COLACE) 100 MG capsule Take 1 capsule (100 mg total) by mouth 2 (two) times daily. 06/01/17   Gladstone Lighter, MD  empagliflozin (JARDIANCE) 25 MG TABS tablet Take 25 mg daily by mouth. 05/15/17   [name], NP  isosorbide mononitrate (IMDUR) 60 MG 24 hr tablet Take 1 tablet (60 mg total) by mouth daily. 06/13/17 09/11/17  Theora Gianotti, NP  meclizine (ANTIVERT) 25 MG tablet Take 25 mg by mouth 3 (three)  times daily as needed for dizziness.    [provider]  metFORMIN (GLUCOPHAGE-XR) 500 MG 24 hr tablet Take 1 tablet (500 mg total) daily with breakfast by mouth. 05/15/17 05/15/18  [name], NP  metoprolol succinate (TOPROL-XL) 50 MG 24 hr tablet Take one tablet (50 mg) daily if systolic blood pressure is >100 06/21/17   Theora Gianotti, NP  nitroGLYCERIN (NITROSTAT) 0.4 MG SL tablet Place 1 tablet (0.4 mg total) under the tongue every 5 (five) minutes as needed for chest pain. 04/16/17   Wellington Hampshire, MD  ondansetron (ZOFRAN ODT) 4 MG disintegrating tablet Take 1 tablet (4 mg total) by mouth every 8 (eight) hours as needed for nausea or vomiting. 06/25/17   Rudene Re, MD  pantoprazole (PROTONIX) 40 MG tablet Take 1 tablet (40 mg total) daily by mouth. 05/21/17   Dunn, Areta Haber, PA-C  ranolazine (RANEXA) 1000 MG SR tablet Take 1 tablet (1,000 mg total) by mouth 2 (two) times daily. 06/13/17   Theora Gianotti, NP  spironolactone (ALDACTONE)  25 MG tablet Take 0.5 tablets (12.5 mg total) daily by mouth. 05/21/17 08/19/17  Rise Mu, PA-C    Allergies Metformin and related  Family History  Problem Relation Age of Onset  . CAD Mother   . Heart failure Mother   . Lupus Mother   . CAD Brother   . Heart attack Brother   . Prostate cancer Father   . Diabetes Sister   . Healthy Paternal Grandmother   . Prostate cancer Paternal Grandfather   . Healthy Brother   . Healthy Sister     Social History Social History   Tobacco Use  . Smoking status: Never Smoker  . Smokeless tobacco: Never Used  Substance Use Topics  . Alcohol use: No    Comment: No Alcohol 12 years, but drank 2 "bottles of liquor" each week for several years prior to quitting  . Drug use: No    Review of Systems  Constitutional: Negative for fever. + chills and generalized weakness Eyes: Negative for visual changes. ENT: Negative for sore throat. Neck: No neck pain   Cardiovascular: Negative for chest pain. Respiratory: Negative for shortness of breath. Gastrointestinal: Negative for abdominal pain, vomiting or diarrhea. + nausea Genitourinary: Negative for dysuria. Musculoskeletal: Negative for back pain. Skin: Negative for rash. Neurological: Negative for headaches, weakness or numbness. Psych: No SI or HI  ____________________________________________   PHYSICAL EXAM:  VITAL SIGNS: ED Triage Vitals  Enc Vitals Group     BP 06/25/17 1351 95/67     Pulse Rate 06/25/17 1351 74     Resp 06/25/17 1351 18     Temp 06/25/17 1351 98.3 F (36.8 C)     Temp Source 06/25/17 1351 Oral     SpO2 06/25/17 1351 92 %     Weight 06/25/17 1352 219 lb (99.3 kg)     Height 06/25/17 1352 _0  (1.753 m)     Head Circumference --      Peak Flow --      Pain Score 06/25/17 1351 0     Pain Loc --      Pain Edu? --      Excl. in [place]? --     Constitutional: Alert and oriented. Well appearing and in no apparent distress. HEENT:      Head: Normocephalic and atraumatic.         Eyes: Conjunctivae are normal. Sclera is non-icteric.       Mouth/Throat: Mucous membranes are moist.       Neck: Supple with no signs of meningismus. Cardiovascular: irregularly regular rhythm with normal rate. No murmurs, gallops, or rubs. 2+ symmetrical distal pulses are present in all extremities. No JVD. Respiratory: Normal respiratory effort. Lungs are clear to auscultation bilaterally with reduced breath sounds in the R base. No wheezes, crackles, or rhonchi.  Gastrointestinal: Soft, non tender, and non distended with positive bowel sounds. No rebound or guarding. Musculoskeletal: Nontender with normal range of motion in all extremities. No edema, cyanosis, or erythema of extremities. Neurologic: Normal speech and language. Face is symmetric. Moving all extremities. No gross focal neurologic deficits are appreciated. Skin: Skin is warm, dry and intact. No rash noted. Psychiatric:  Mood and affect are normal. Speech and behavior are normal.  ____________________________________________   LABS (all labs ordered are listed, but only abnormal results are displayed)  Labs Reviewed  BASIC METABOLIC PANEL - Abnormal; Notable for the following components:      Result Value   Glucose, Bld 207 (*)  All other components within normal limits  CBC - Abnormal; Notable for the following components:   RDW 15.3 (*)    All other components within normal limits  TROPONIN I - Abnormal; Notable for the following components:   Troponin I 0.03 (*)    All other components within normal limits  DIGOXIN LEVEL - Abnormal; Notable for the following components:   Digoxin Level 0.6 (*)    All other components within normal limits  URINALYSIS, COMPLETE (UACMP) WITH MICROSCOPIC - Abnormal; Notable for the following components:   Color, Urine YELLOW (*)    APPearance CLEAR (*)    Specific Gravity, Urine 1.033 (*)    Glucose, UA >=500 (*)    Nitrite POSITIVE (*)    Bacteria, UA RARE (*)    All other components within normal limits  URINE CULTURE  INFLUENZA PANEL BY PCR (TYPE A & B)  TROPONIN I   ____________________________________________  EKG  ED ECG REPORT I, Rudene Re, the attending physician, personally viewed and interpreted this ECG.  Atrial fibrillation, rate of 85, normal QTC, normal axis, no ST elevations or depressions. T-wave in 3 and aVF. no significant changes when compared to prior from 4 days ago. ____________________________________________  RADIOLOGY  CXR: Stable mild cardiomegaly. Stable left lower lung scarring. No acute findings. ____________________________________________   PROCEDURES  Procedure(s) performed: None Procedures Critical Care performed:  None ____________________________________________   INITIAL IMPRESSION / ASSESSMENT AND PLAN / ED COURSE  66 y.o. male with h/o CHF (EF 35-40%), CAD on Plavix, aortic stenosis, DM, afib  on Digoxin, HTN, HLD who presents for evaluation of nausea, chills, and generalized weakness since this am. patient is well-appearing, and in no distress, vital show oxygenation of 92% with a pulse of 74 and a BP of 95/67. The patient looks euvolemic, lungs are clear to auscultation with reduced breath sounds on the right base. EKG showing afib with no evidence of ischemia. Will check dig level to rule out toxicity. Will check UA to rule out UTI. Will check Flu. Will check labs to rule out anemia, electrolyte abnormalities, DKA. Will give gentle hydration and zofran and reassess.   Clinical Course as of Jun 26 2007  Mon Jun 25, 2017  1936 Patient reports feeling markedly improved after IVF and zofran. Labs, CXR, Flu with no acute findings. Will get 2nd troponin at 8:15PM and check UA. If both WNl and patient remains well appearing, will dc home  [CV]    Clinical Course User Index [CV] Alfred Levins, [place], MD   _________________________ 7:50 PM on 06/25/2017 -----------------------------------------  UA positive for UTI. Patient given rocephin. Plan to dc home on keflex. Care transferred to Dr. Jacqualine Code with 2nd troponin pending    As part of my medical decision making, I reviewed the following data within the [place] notes reviewed and incorporated, Labs reviewed , EKG interpreted , Old EKG reviewed, Old chart reviewed, Patient signed out to Dr. Jacqualine Code, Radiograph reviewed , Notes from prior ED visits and  Controlled Substance Database    Pertinent labs & imaging results that were available during my care of the patient were reviewed by me and considered in my medical decision making (see chart for details).    ____________________________________________   FINAL CLINICAL IMPRESSION(S) / ED DIAGNOSES  Final diagnoses:  Generalized weakness  Nausea  Acute cystitis with hematuria      NEW MEDICATIONS STARTED DURING THIS VISIT:  ED Discharge Orders  Ordered    ondansetron (ZOFRAN ODT) 4 MG disintegrating tablet  Every 8 hours PRN     06/25/17 1940    cephALEXin (KEFLEX) 500 MG capsule  3 times daily     06/25/17 2007       Note:  This document was prepared using Dragon voice recognition software and may include unintentional dictation errors.    Rudene Re, MD 06/25/17 [place], [place], MD 06/25/17 2008

## 2017-06-25 NOTE — ED Triage Notes (Signed)
Pt arrived via EMS for reports of nausea and weakness that began today. Denies diarrhea. Denies any pain.

## 2017-06-28 ENCOUNTER — Encounter: Payer: BLUE CROSS/BLUE SHIELD | Admitting: *Deleted

## 2017-06-28 DIAGNOSIS — Z955 Presence of coronary angioplasty implant and graft: Secondary | ICD-10-CM | POA: Diagnosis not present

## 2017-06-28 DIAGNOSIS — Z9861 Coronary angioplasty status: Secondary | ICD-10-CM

## 2017-06-28 DIAGNOSIS — I214 Non-ST elevation (NSTEMI) myocardial infarction: Secondary | ICD-10-CM

## 2017-06-28 LAB — URINE CULTURE

## 2017-06-28 NOTE — Progress Notes (Signed)
Daily Session Note  Patient Details  Name: Christopher Bradly Sr. MRN: [id number] Date of Birth: 04/15/1951 Referring Provider:     Cardiac Rehab from 05/21/2017 in [facility]  Referring Provider  Kathlyn Sacramento MD      Encounter Date: 06/28/2017  Check In: Session Check In - 06/28/17 1333      Check-In   Location  [facility]    Staff Present  Nada Maclachlan, BA, ACSM CEP, Exercise Physiologist;Carroll Enterkin, RN, Levie Heritage, MA, ACSM RCEP, Exercise Physiologist    Supervising physician immediately available to respond to emergencies  See telemetry face sheet for immediately available ER MD    Medication changes reported      No    Fall or balance concerns reported     No    Warm-up and Cool-down  Performed on first and last piece of equipment    Resistance Training Performed  Yes    VAD Patient?  No      Pain Assessment   Currently in Pain?  No/denies        Exercise Prescription Changes - 06/28/17 1400      Response to Exercise   Blood Pressure (Admit)  124/56    Blood Pressure (Exercise)  124/56    Blood Pressure (Exit)  118/60    Heart Rate (Admit)  93 bpm    Heart Rate (Exercise)  107 bpm    Heart Rate (Exit)  91 bpm    Rating of Perceived Exertion (Exercise)  12    Symptoms  none    Duration  Continue with 45 min of aerobic exercise without signs/symptoms of physical distress.    Intensity  THRR unchanged      Progression   Progression  Continue to progress workloads to maintain intensity without signs/symptoms of physical distress.    Average METs  2.43      Resistance Training   Training Prescription  Yes    Weight  5 lbs    Reps  10-15      Interval Training   Interval Training  No      Recumbant Bike   Level  2    Watts  16    Minutes  15    METs  2.47      Track   Laps  30    Minutes  15    METs  2.38      Home Exercise Plan   Plans to continue exercise at  Home (comment) walk or at the  mall    Frequency  Add 1 additional day to program exercise sessions.    Initial Home Exercises Provided  06/07/17       Social History   Tobacco Use  Smoking Status Never Smoker  Smokeless Tobacco Never Used    Goals Met:  Independence with exercise equipment Exercise tolerated well No report of cardiac concerns or symptoms Strength training completed today  Goals Unmet:  Not Applicable  Comments: Pt able to follow exercise prescription today without complaint.  Will continue to monitor for progression.    Dr. Emily Filbert is Medical Director for [facility].

## 2017-06-29 ENCOUNTER — Ambulatory Visit: Payer: BLUE CROSS/BLUE SHIELD | Admitting: Nurse Practitioner

## 2017-06-29 ENCOUNTER — Other Ambulatory Visit: Payer: Self-pay

## 2017-06-29 ENCOUNTER — Encounter: Payer: Self-pay | Admitting: Nurse Practitioner

## 2017-06-29 VITALS — BP 111/56 | HR 70 | Temp 98.3°F | Ht 69.0 in | Wt 217.8 lb

## 2017-06-29 DIAGNOSIS — K5901 Slow transit constipation: Secondary | ICD-10-CM | POA: Diagnosis not present

## 2017-06-29 DIAGNOSIS — E1159 Type 2 diabetes mellitus with other circulatory complications: Secondary | ICD-10-CM | POA: Diagnosis not present

## 2017-06-29 DIAGNOSIS — B351 Tinea unguium: Secondary | ICD-10-CM

## 2017-06-29 MED ORDER — DOCUSATE SODIUM 100 MG PO CAPS: 100.0000 mg | ORAL_CAPSULE | Freq: Two times a day (BID) | ORAL | 5 refills | Status: AC

## 2017-06-29 NOTE — Patient Instructions (Addendum)
Christopher Cummings, Thank you for coming in to clinic today.  1. FOR your toenails: - I have placed a podiatry referral to [facility].  They are located at [address]  2. FOR your diabetes: - Continue current medications (metformin XR 500 mg once daily and Jardiance once daily)  3. For your constipation: - Continue colace 100 mg twice daily every day.  Once you have regular BM you can reduce to one tablet once daily. - Today and tomorrow, take Dulcolax (bisacodyl) 5 mg once or twice daily as needed for worsening constipation.    FOR your eyes: Your provider would like to you have your annual eye exam. Please contact your current eye doctor or here are some good options for you to contact.   [facility]         Address: [address], [place], [place] [address]    Phone: [phone]        Website: [email or web address]        [facility] Address: [address], [place], [place] [address]  Phone: [phone] Website: [email or web address]  [facility]        Address: [address], [place], [place] [address]    Phone: [phone]        [facility]  Address: [address], [place], [place] [address] Phone: [phone]   [facility] Address: [address], [place], [place] [address]  Phone: [phone]  Please schedule a follow-up appointment with Wilhelmina McardleLauren Landen [place]eland, AGNP. Return in about 2 months (around 08/30/2017) for Diabetes.  If you have any other questions or concerns, please feel free to call the clinic or send a message through MyChart. You may also schedule an earlier appointment if necessary.  You will receive a survey after today's visit either digitally by e-mail or paper by [employer] mail. Your experiences and feedback matter to us.  Please respond so we know how we are doing as we provide care for you.  Wilhelmina McardleLauren Mikias Lanz, DNP, AGNP-BC Adult Gerontology Nurse Practitioner [facility] [place] [facility], [facility]

## 2017-06-29 NOTE — Progress Notes (Signed)
Subjective:    Patient ID: Christopher Elseharles Glassco Sr., male    DOB: Nov 25, 1950, 66 y.o.   MRN: [id number]  Christopher ElseCharles Gaede Sr. is a 66 y.o. male presenting on 06/29/2017 for Diabetes; Constipation (x 4 days, with only hard dime size stool. ); and Urinary Tract Infection (pt recently diagnose w/ UTI,. Some symptoms have resolved, but still foul odor.)   HPI UTI Feeling better since ER visit for UTI.  Is taking Keflex as prescribed and has no concerns other than foul odor.  Diabetes Pt presents today for follow up of Type 2 diabetes mellitus. He is checking fasting am CBG at home with a range of 89-156, with 2 readings in 200s related to diet. - Current diabetic medications include: metformin and Jardiance - He is symptomatic with nocturia.  - He denies polydipsia, polyphagia, polyuria, headaches, diaphoresis, shakiness, chills, pain, numbness or tingling in extremities and changes in vision.   - Clinical course has been improving. - He  reports an exercise routine that includes has resumed cardiac rehab again yesterday and walking, as tolerated, but with goal of 7 days per week weather permitting. - His diet is low in salt, moderate in fat, and moderate in carbohydrates. - Weight trend: stable with steady weight loss for goal of 180 lbs for pt stated goal  PREVENTION: Eye exam current (within one year): Had diabetic eye exam in September 2017 in [place] No record currently. Foot exam current (within one year): no Lipid/ASCVD risk reduction - on statin: yes Kidney protection - on ace or arb: no - removed by cardiology Recent Labs    03/31/17 2130 05/07/17 1507  HGBA1C 8.1* 9.3*   Constipation Regularly has stool with hard marble-like BM.  Is physically uncomfortable with bloating. Pt requests medications to use.  He has not had success with colace, but has only taken 2 doses of 200 mg each yesterday.  Social History   Tobacco Use  . Smoking status: Never Smoker  . Smokeless  tobacco: Never Used  Substance Use Topics  . Alcohol use: No    Comment: No Alcohol 12 years, but drank 2 "bottles of liquor" each week for several years prior to quitting  . Drug use: No    Review of Systems Per HPI unless specifically indicated above     Objective:    BP (!) 111/56 (BP Location: Right Arm, Patient Position: Sitting, Cuff Size: Normal)   Pulse 70   Temp 98.3 F (36.8 C) (Oral)   Ht 5\' 9"  (1.753 m)   Wt 217 lb 12.8 oz (98.8 kg)   BMI 32.16 kg/m   Wt Readings from Last 3 Encounters:  06/29/17 217 lb 12.8 oz (98.8 kg)  06/25/17 219 lb (99.3 kg)  06/21/17 219 lb 8 oz (99.6 kg)    Physical Exam  General - obese, well-appearing, NAD HEENT - Normocephalic, atraumatic Neck - supple, non-tender, no LADno carotid bruit Heart - RRR, no murmurs heard Lungs - Clear throughout all lobes, no wheezing, crackles, or rhonchi. Normal work of breathing. Abdomen - soft, Nontender except RLQ, non-distended, no masses, no hepatosplenomegaly, active bowel sounds Extremeties - non-tender, no edema, cap refill < 2 seconds, peripheral pulses intact +2 bilaterally Skin - warm, dry Neuro - awake, alert, oriented x3, normal gait Psych - Normal mood and affect, normal behavior   Diabetic Foot Form - Detailed   Diabetic Foot Exam - detailed Diabetic Foot exam was performed with the following findings:  Yes 06/29/2017  9:20 AM  Visual Foot Exam completed.:  Yes  Can the patient see the bottom of their feet?:  Yes Are the shoes appropriate in style and fit?:  Yes Is there swelling or and abnormal foot shape?:  No Is there a claw toe deformity?:  No Is there elevated skin temparature?:  No Is there foot or ankle muscle weakness?:  No Normal Range of Motion:  Yes Right posterior Tibialias:  Present Left posterior Tibialias:  Present  Right Dorsalis Pedis:  Present Left Dorsalis Pedis:  Present  Sensory Foot Exam Completed.:  Yes Semmes-Weinstein Monofilament Test R Site 1-Great  Toe:  Pos L Site 1-Great Toe:  Pos        Results for orders placed or performed during the hospital encounter of 06/25/17  Urine Culture  Result Value Ref Range   Specimen Description      URINE, RANDOM Performed at [facility], [address] [place], [place] [address]    Special Requests      NONE Performed at [facility], [address] [place], [place] [address]    Culture >=100,000 COLONIES/mL ESCHERICHIA COLI (A)    Report Status 06/28/2017 FINAL    Organism ID, Bacteria ESCHERICHIA COLI (A)       Susceptibility   Escherichia coli - MIC*    AMPICILLIN 8 SENSITIVE Sensitive     CEFAZOLIN <=4 SENSITIVE Sensitive     CEFTRIAXONE <=1 SENSITIVE Sensitive     CIPROFLOXACIN <=0.25 SENSITIVE Sensitive     GENTAMICIN <=1 SENSITIVE Sensitive     IMIPENEM <=0.25 SENSITIVE Sensitive     NITROFURANTOIN <=16 SENSITIVE Sensitive     TRIMETH/SULFA <=20 SENSITIVE Sensitive     AMPICILLIN/SULBACTAM <=2 SENSITIVE Sensitive     PIP/TAZO <=4 SENSITIVE Sensitive     Extended ESBL NEGATIVE Sensitive     * >=100,000 COLONIES/mL ESCHERICHIA COLI  Basic metabolic panel  Result Value Ref Range   Sodium 136 135 - 145 mmol/L   Potassium 3.7 3.5 - 5.1 mmol/L   Chloride 103 101 - 111 mmol/L   CO2 22 22 - 32 mmol/L   Glucose, Bld 207 (H) 65 - 99 mg/dL   BUN 15 6 - 20 mg/dL   Creatinine, Ser 0.98 0.61 - 1.24 mg/dL   Calcium 9.0 8.9 - 11.9 mg/dL   GFR calc non Af Amer >60 >60 mL/min   GFR calc Af Amer >60 >60 mL/min   Anion gap 11 5 - 15  CBC  Result Value Ref Range   WBC 10.3 3.8 - 10.6 K/uL   RBC 4.92 4.40 - 5.90 MIL/uL   Hemoglobin 13.4 13.0 - 18.0 g/dL   HCT 14.7 82.9 - 56.2 %   MCV 82.3 80.0 - 100.0 fL   MCH 27.2 26.0 - 34.0 pg   MCHC 33.1 32.0 - 36.0 g/dL   RDW 13.0 (H) 86.5 - 78.4 %   Platelets 254 150 - 440 K/uL  Troponin I  Result Value Ref Range   Troponin I 0.03 (HH) <0.03 ng/mL  Digoxin level  Result Value Ref Range   Digoxin Level 0.6 (L) 0.8 -  2.0 ng/mL  Influenza panel by PCR (type A & B)  Result Value Ref Range   Influenza A By PCR NEGATIVE NEGATIVE   Influenza B By PCR NEGATIVE NEGATIVE  Urinalysis, Complete w Microscopic  Result Value Ref Range   Color, Urine YELLOW (A) YELLOW   APPearance CLEAR (A) CLEAR   Specific Gravity, Urine 1.033 (H) 1.005 - 1.030  pH 5.0 5.0 - 8.0   Glucose, UA >=500 (A) NEGATIVE mg/dL   Hgb urine dipstick NEGATIVE NEGATIVE   Bilirubin Urine NEGATIVE NEGATIVE   Ketones, ur NEGATIVE NEGATIVE mg/dL   Protein, ur NEGATIVE NEGATIVE mg/dL   Nitrite POSITIVE (A) NEGATIVE   Leukocytes, UA NEGATIVE NEGATIVE   RBC / HPF 0-5 0 - 5 RBC/hpf   WBC, UA 6-30 0 - 5 WBC/hpf   Bacteria, UA RARE (A) NONE SEEN   Squamous Epithelial / LPF NONE SEEN NONE SEEN   Mucus PRESENT   Troponin I  Result Value Ref Range   Troponin I <0.03 <0.03 ng/mL      Assessment & Plan:   Problem List Items Addressed This Visit      Endocrine   Diabetes (HCC) - Primary    Improving control with am fasting CBGs on Jardiance 25 mg once daily and metforminER500 mg once daily. Pt w/ most recent A1c in November = 9.3%.    Plan: 1. Reviewed low glycemic diet. - Obtain ophthalmology exam.  Reference provided for ophthalmologist. 2. Encouraged pt to check daily cbg fasting am. 3. Continue Jardiance 25 mg once daily. - Continue metformin 500 mg once daily.  Not increasing 2/2 cardiac disease at this time. 4. Follow up in 2 months for repeat A1c.      Relevant Orders   Ambulatory referral to Podiatry    Other Visit Diagnoses    Fungal toenail infection       Pt with chronic toenail infection.  causes nail care to be difficult for pt with occasional cuts to skin.  Pt request podiatry for regular nail care. Referral provided.   Relevant Orders   Ambulatory referral to Podiatry   Slow transit constipation     Pt with acute constipation not relieved by 1 day of colace dosing.  Plan: 1. Encouraged adequate water intake.  Do not  limit more than cardiac recommended restriction. 2. Continue colace 100 mg twice daily every day.  Once establishes regular BM, reduce to one tablet once daily. 3. For 2 days, take Dulcolax (bisacodyl) 5 mg once or twice daily as needed for worsening constipation.  May repeat for recurrence of constipation. 4. Followup as needed.       Meds ordered this encounter  Medications  . docusate sodium (COLACE) 100 MG capsule    Sig: Take 1 capsule (100 mg total) by mouth 2 (two) times daily.    Dispense:  60 capsule    Refill:  5    Order Specific Question:   Supervising Provider    Answer:   Smitty CordsKARAMALEGOS, ALEXANDER J [[id number]]    Follow up plan: Return in about 2 months (around 08/30/2017) for Diabetes.   Wilhelmina McardleLauren Jupiter Kabir, DNP, AGPCNP-BC Adult Gerontology Primary Care Nurse Practitioner [facility] 07/02/2017, 9:26 AM

## 2017-07-02 ENCOUNTER — Encounter: Payer: Self-pay | Admitting: Nurse Practitioner

## 2017-07-02 ENCOUNTER — Other Ambulatory Visit: Payer: Self-pay

## 2017-07-02 MED ORDER — NITROGLYCERIN 0.4 MG SL SUBL: 0.4000 mg | SUBLINGUAL_TABLET | SUBLINGUAL | 3 refills | Status: DC | PRN

## 2017-07-02 NOTE — Telephone Encounter (Signed)
Refill sent for NTG 0.4 mg  

## 2017-07-02 NOTE — Assessment & Plan Note (Addendum)
Improving control with am fasting CBGs on Jardiance 25 mg once daily and metforminER500 mg once daily. Pt w/ most recent A1c in November = 9.3%.    Plan: 1. Reviewed low glycemic diet. - Obtain ophthalmology exam.  Reference provided for ophthalmologist. 2. Encouraged pt to check daily cbg fasting am. 3. Continue Jardiance 25 mg once daily. - Continue metformin 500 mg once daily.  Not increasing 2/2 cardiac disease at this time. 4. Follow up in 2 months for repeat A1c.

## 2017-07-04 ENCOUNTER — Encounter: Payer: Self-pay | Admitting: Family

## 2017-07-04 ENCOUNTER — Encounter: Payer: Self-pay | Admitting: *Deleted

## 2017-07-04 ENCOUNTER — Ambulatory Visit: Payer: BLUE CROSS/BLUE SHIELD | Attending: Family | Admitting: Family

## 2017-07-04 VITALS — BP 122/68 | HR 80 | Resp 18 | Ht 69.0 in | Wt 219.4 lb

## 2017-07-04 DIAGNOSIS — I5022 Chronic systolic (congestive) heart failure: Secondary | ICD-10-CM

## 2017-07-04 DIAGNOSIS — Z8042 Family history of malignant neoplasm of prostate: Secondary | ICD-10-CM | POA: Diagnosis not present

## 2017-07-04 DIAGNOSIS — Z8249 Family history of ischemic heart disease and other diseases of the circulatory system: Secondary | ICD-10-CM | POA: Insufficient documentation

## 2017-07-04 DIAGNOSIS — Z7984 Long term (current) use of oral hypoglycemic drugs: Secondary | ICD-10-CM | POA: Diagnosis not present

## 2017-07-04 DIAGNOSIS — Z955 Presence of coronary angioplasty implant and graft: Secondary | ICD-10-CM | POA: Diagnosis not present

## 2017-07-04 DIAGNOSIS — I214 Non-ST elevation (NSTEMI) myocardial infarction: Secondary | ICD-10-CM

## 2017-07-04 DIAGNOSIS — Z9889 Other specified postprocedural states: Secondary | ICD-10-CM | POA: Diagnosis not present

## 2017-07-04 DIAGNOSIS — E119 Type 2 diabetes mellitus without complications: Secondary | ICD-10-CM | POA: Diagnosis not present

## 2017-07-04 DIAGNOSIS — E1159 Type 2 diabetes mellitus with other circulatory complications: Secondary | ICD-10-CM

## 2017-07-04 DIAGNOSIS — Z79899 Other long term (current) drug therapy: Secondary | ICD-10-CM | POA: Insufficient documentation

## 2017-07-04 DIAGNOSIS — I25110 Atherosclerotic heart disease of native coronary artery with unstable angina pectoris: Secondary | ICD-10-CM | POA: Insufficient documentation

## 2017-07-04 DIAGNOSIS — I110 Hypertensive heart disease with heart failure: Secondary | ICD-10-CM | POA: Insufficient documentation

## 2017-07-04 DIAGNOSIS — I10 Essential (primary) hypertension: Secondary | ICD-10-CM

## 2017-07-04 DIAGNOSIS — I509 Heart failure, unspecified: Secondary | ICD-10-CM | POA: Insufficient documentation

## 2017-07-04 DIAGNOSIS — Z7982 Long term (current) use of aspirin: Secondary | ICD-10-CM | POA: Insufficient documentation

## 2017-07-04 NOTE — Progress Notes (Signed)
Cardiac Individual Treatment Plan  Patient Details  Name: Christopher Rothe Sr. MRN: [id number] Date of Birth: [birth year] Referring Provider:     Cardiac Rehab from 05/21/2017 in [facility] Cardiac and Pulmonary Rehab  Referring Provider  Kathlyn Sacramento MD      Initial Encounter Date:    Cardiac Rehab from 05/21/2017 in [facility] Cardiac and Pulmonary Rehab  Date  05/21/17  Referring Provider  Kathlyn Sacramento MD      Visit Diagnosis: NSTEMI (non-ST elevated myocardial infarction) [facility])  Patient's Home Medications on Admission:  Current Outpatient Medications:  .  acetaminophen (TYLENOL) 325 MG tablet, Take 2 tablets (650 mg total) by mouth every 6 (six) hours as needed for mild pain (or Fever >/= 101)., Disp: , Rfl:  .  aspirin EC 81 MG tablet, Take 1 tablet (81 mg total) by mouth daily., Disp: , Rfl:  .  atorvastatin (LIPITOR) 80 MG tablet, Take 1 tablet (80 mg total) by mouth at bedtime., Disp: 90 tablet, Rfl: 3 .  blood glucose meter kit and supplies, Dispense based on patient and insurance preference. Use 1 time daily as directed. (FOR ICD-9 250.00, 250.01)., Disp: 1 each, Rfl: 0 .  Blood Pressure Monitoring (BLOOD PRESSURE CUFF) MISC, 1 Units by Does not apply route daily., Disp: 1 each, Rfl: 0 .  clopidogrel (PLAVIX) 75 MG tablet, Take 1 tablet (75 mg total) daily by mouth., Disp: 90 tablet, Rfl: 3 .  digoxin (LANOXIN) 0.125 MG tablet, Take 0.5 tablets (0.0625 mg total) by mouth daily., Disp: 15 tablet, Rfl: 0 .  docusate sodium (COLACE) 100 MG capsule, Take 1 capsule (100 mg total) by mouth 2 (two) times daily., Disp: 60 capsule, Rfl: 5 .  empagliflozin (JARDIANCE) 25 MG TABS tablet, Take 25 mg daily by mouth., Disp: 30 tablet, Rfl: 5 .  isosorbide mononitrate (IMDUR) 60 MG 24 hr tablet, Take 1 tablet (60 mg total) by mouth daily., Disp: 60 tablet, Rfl: 6 .  meclizine (ANTIVERT) 25 MG tablet, Take 25 mg by mouth 3 (three) times daily as needed for dizziness., Disp: , Rfl:  .  metFORMIN  (GLUCOPHAGE-XR) 500 MG 24 hr tablet, Take 1 tablet (500 mg total) daily with breakfast by mouth., Disp: 30 tablet, Rfl: 5 .  metoprolol succinate (TOPROL-XL) 50 MG 24 hr tablet, Take one tablet (50 mg) daily if systolic blood pressure is >100, Disp: 30 tablet, Rfl: 3 .  nitroGLYCERIN (NITROSTAT) 0.4 MG SL tablet, Place 1 tablet (0.4 mg total) under the tongue every 5 (five) minutes as needed for chest pain., Disp: 30 tablet, Rfl: 3 .  ondansetron (ZOFRAN ODT) 4 MG disintegrating tablet, Take 1 tablet (4 mg total) by mouth every 8 (eight) hours as needed for nausea or vomiting., Disp: 20 tablet, Rfl: 0 .  pantoprazole (PROTONIX) 40 MG tablet, Take 1 tablet (40 mg total) daily by mouth., Disp: 90 tablet, Rfl: 3 .  ranolazine (RANEXA) 1000 MG SR tablet, Take 1 tablet (1,000 mg total) by mouth 2 (two) times daily., Disp: 42 tablet, Rfl: 0 .  spironolactone (ALDACTONE) 25 MG tablet, Take 0.5 tablets (12.5 mg total) daily by mouth., Disp: 45 tablet, Rfl: 3  Past Medical History: Past Medical History:  Diagnosis Date  . Aortic stenosis    a. 07/2015 s/p bioprosthetic AVR (#23 [employer]) - [place]; b. 04/2017 Echo: nl fxn'ing AoV.  Marland Kitchen CAD (coronary artery disease)    a. 2009 PCI->D1; b. 07/2015 CABG x 2 reported (LIMA-LAD noted on cath 01/2017) [facility] [place]  [place]; c. 2017/2018 Prox/Dist LCX & D1 stenting; d. 01/2017 PCI: ISR prox/dist LCX stents (PTCA), ISR D1 (med Rx); e. 04/2017 PCI: CBA for ISR- LCX 90p, 70d, D1 95; f. 05/2017 PCI: D1 99 ISR, LCX 70p (2.25x22 Onyx DES); g. 06/2017 Cath: Patent LCX and RCA stents. D1 100 ISR->Med Rx.  . Chronic systolic CHF (congestive heart failure) ([place])    a. 04/2017 Echo: EF 35-40% with nl functioning bioprosthetic AoV, mild MR, nl [place].  . Colitis   . Diabetes mellitus with complication ([place])   . Diverticulitis   . Essential hypertension   . Hyperlipidemia   . Ischemic cardiomyopathy    a. 04/2017 Echo: EF 35-40%.  . Rectal bleeding    a. 03/2017 -> f/u @ [facility]  GI.    Tobacco Use: Social History   Tobacco Use  Smoking Status Never Smoker  Smokeless Tobacco Never Used    Labs: Recent Review Flowsheet Data    Labs for ITP Cardiac and Pulmonary Rehab Latest Ref Rng & Units 02/25/2017 03/31/2017 04/25/2017 05/07/2017   Cholestrol 0 - 200 mg/dL 89 108 80 -   LDLCALC 0 - 99 mg/dL 42 43 36 -   HDL >40 mg/dL 35(L) 37(L) 29(L) -   Trlycerides <150 mg/dL 60 138 77 -   Hemoglobin A1c 4.8 - 5.6 % - 8.1(H) - 9.3(H)       Exercise Target Goals:    Exercise Program Goal: Individual exercise prescription set with THRR, safety & activity barriers. Participant demonstrates ability to understand and report RPE using BORG scale, to self-measure pulse accurately, and to acknowledge the importance of the exercise prescription.  Exercise Prescription Goal: Starting with aerobic activity 30 plus minutes a day, 3 days per week for initial exercise prescription. Provide home exercise prescription and guidelines that participant acknowledges understanding prior to discharge.  Activity Barriers & Risk Stratification: Activity Barriers & Cardiac Risk Stratification - 05/21/17 1222      Activity Barriers & Cardiac Risk Stratification   Activity Barriers  Other (comment);Deconditioning;Muscular Weakness;Balance Concerns;Decreased Ventricular Function    Comments  Vertigo    Cardiac Risk Stratification  High       6 Minute Walk: 6 Minute Walk    Row Name 05/21/17 1221         6 Minute Walk   Phase  Initial     Distance  1165 feet     Walk Time  6 minutes     # of Rest Breaks  0     MPH  2.21     METS  2.51     RPE  7     VO2 Peak  8.8     Symptoms  No     Resting HR  80 bpm     Resting BP  132/70     Resting Oxygen Saturation   96 %     Exercise Oxygen Saturation  during 6 min walk  97 %     Max Ex. HR  115 bpm     Max Ex. BP  132/76     2 Minute Post BP  126/60        Oxygen Initial Assessment:   Oxygen Re-Evaluation:   Oxygen  Discharge (Final Oxygen Re-Evaluation):   Initial Exercise Prescription: Initial Exercise Prescription - 05/21/17 1200      Date of Initial Exercise RX and Referring Provider   Date  05/21/17    Referring Provider  Kathlyn Sacramento MD  Recumbant Bike   Level  2    RPM  50    Watts  16    Minutes  15    METs  2.5      NuStep   Level  2    SPM  80    Minutes  15    METs  2.5      Track   Laps  32    Minutes  15    METs  2.45      Prescription Details   Frequency (times per week)  3    Duration  Progress to 45 minutes of aerobic exercise without signs/symptoms of physical distress      Intensity   THRR 40-80% of Max Heartrate  110-141    Ratings of Perceived Exertion  11-13    Perceived Dyspnea  0-4      Progression   Progression  Continue to progress workloads to maintain intensity without signs/symptoms of physical distress.      Resistance Training   Training Prescription  Yes    Weight  3 lbs    Reps  10-15       Perform Capillary Blood Glucose checks as needed.  Exercise Prescription Changes: Exercise Prescription Changes    Row Name 05/21/17 1200 05/29/17 1300 06/07/17 0900 06/13/17 1300 06/28/17 1400     Response to Exercise   Blood Pressure (Admit)  132/70  124/72  -  124/64  124/56   Blood Pressure (Exercise)  132/76  130/72  -  126/84  124/56   Blood Pressure (Exit)  126/60  126/64  -  120/62  118/60   Heart Rate (Admit)  80 bpm  101 bpm  -  96 bpm  93 bpm   Heart Rate (Exercise)  115 bpm  120 bpm  -  127 bpm  107 bpm   Heart Rate (Exit)  80 bpm  87 bpm  -  90 bpm  91 bpm   Oxygen Saturation (Admit)  96 %  -  -  -  -   Oxygen Saturation (Exercise)  97 %  -  -  -  -   Rating of Perceived Exertion (Exercise)  7  13  -  12  12   Symptoms  none  none  -  none  none   Comments  walk test results  first full day of exercise  -  -  -   Duration  -  Continue with 45 min of aerobic exercise without signs/symptoms of physical distress.  -  Continue  with 45 min of aerobic exercise without signs/symptoms of physical distress.  Continue with 45 min of aerobic exercise without signs/symptoms of physical distress.   Intensity  -  THRR unchanged  -  THRR unchanged  THRR unchanged     Progression   Progression  -  Continue to progress workloads to maintain intensity without signs/symptoms of physical distress.  -  Continue to progress workloads to maintain intensity without signs/symptoms of physical distress.  Continue to progress workloads to maintain intensity without signs/symptoms of physical distress.   Average METs  -  2.67  -  2.38  2.43     Resistance Training   Training Prescription  -  Yes  -  Yes  Yes   Weight  -  5 lbs  -  5 lbs  5 lbs   Reps  -  10-15  -  10-15  10-15  Interval Training   Interval Training  -  No  -  No  No     Recumbant Bike   Level  -  2  -  2  2   Watts  -  20  -  16  16   Minutes  -  15  -  15  15   METs  -  2.63  -  2.47  2.47     NuStep   Level  -  2  -  2  -   Minutes  -  15  -  15  -   METs  -  2.7  -  2.3  -     Track   Laps  -  37  -  30  30   Minutes  -  15  -  15  15   METs  -  2.7  -  2.38  2.38     Home Exercise Plan   Plans to continue exercise at  -  -  Home (comment) walk or at the mall  Home (comment) walk or at the mall  Home (comment) walk or at the mall   Frequency  -  -  Add 1 additional day to program exercise sessions.  Add 1 additional day to program exercise sessions.  Add 1 additional day to program exercise sessions.   Initial Home Exercises Provided  -  -  06/07/17  06/07/17  06/07/17      Exercise Comments: Exercise Comments    Row Name 05/23/17 0754 06/07/17 0914         Exercise Comments  First full day of exercise!  Patient was oriented to gym and equipment including functions, settings, policies, and procedures.  Patient's individual exercise prescription and treatment plan were reviewed.  All starting workloads were established based on the results of the  6 minute walk test done at initial orientation visit.  The plan for exercise progression was also introduced and progression will be customized based on patient's performance and goals  Reviewed home exercise with pt today.  Pt plans to walk for exercise.  Reviewed THR, pulse, RPE, sign and symptoms, NTG use, and when to call 911 or MD.  Also discussed weather considerations and indoor options.  Pt voiced understanding.         Exercise Goals and Review: Exercise Goals    Row Name 05/21/17 1224             Exercise Goals   Increase Physical Activity  Yes       Intervention  Provide advice, education, support and counseling about physical activity/exercise needs.;Develop an individualized exercise prescription for aerobic and resistive training based on initial evaluation findings, risk stratification, comorbidities and participant's personal goals.       Expected Outcomes  Achievement of increased cardiorespiratory fitness and enhanced flexibility, muscular endurance and strength shown through measurements of functional capacity and personal statement of participant.       Increase Strength and Stamina  Yes       Intervention  Provide advice, education, support and counseling about physical activity/exercise needs.;Develop an individualized exercise prescription for aerobic and resistive training based on initial evaluation findings, risk stratification, comorbidities and participant's personal goals.       Expected Outcomes  Achievement of increased cardiorespiratory fitness and enhanced flexibility, muscular endurance and strength shown through measurements of functional capacity and personal statement of participant.       Able  to understand and use rate of perceived exertion (RPE) scale  Yes       Intervention  Provide education and explanation on how to use RPE scale       Expected Outcomes  Short Term: Able to use RPE daily in rehab to express subjective intensity level;Long Term:  Able to  use RPE to guide intensity level when exercising independently       Knowledge and understanding of Target Heart Rate Range (THRR)  Yes       Intervention  Provide education and explanation of THRR including how the numbers were predicted and where they are located for reference       Expected Outcomes  Short Term: Able to state/look up THRR;Long Term: Able to use THRR to govern intensity when exercising independently;Short Term: Able to use daily as guideline for intensity in rehab       Able to check pulse independently  Yes       Intervention  Provide education and demonstration on how to check pulse in carotid and radial arteries.;Review the importance of being able to check your own pulse for safety during independent exercise       Expected Outcomes  Short Term: Able to explain why pulse checking is important during independent exercise;Long Term: Able to check pulse independently and accurately       Understanding of Exercise Prescription  Yes       Intervention  Provide education, explanation, and written materials on patient's individual exercise prescription       Expected Outcomes  Short Term: Able to explain program exercise prescription;Long Term: Able to explain home exercise prescription to exercise independently          Exercise Goals Re-Evaluation : Exercise Goals Re-Evaluation    Row Name 05/23/17 0755 05/29/17 1353 06/07/17 0914 06/28/17 1341       Exercise Goal Re-Evaluation   Exercise Goals Review  Understanding of Exercise Prescription;Knowledge and understanding of Target Heart Rate Range (THRR);Able to understand and use rate of perceived exertion (RPE) scale;Able to check pulse independently  Understanding of Exercise Prescription;Increase Physical Activity;Increase Strength and Stamina  Increase Physical Activity;Able to check pulse independently;Able to understand and use rate of perceived exertion (RPE) scale;Increase Strength and Stamina;Knowledge and understanding of  Target Heart Rate Range (THRR);Understanding of Exercise Prescription  Increase Physical Activity;Increase Strength and Stamina;Understanding of Exercise Prescription    Comments  Reviewed RPE scale, THR and program prescription with pt today.  Pt voiced understanding and was given a copy of goals to take home.   Hatem completed his first full day of exercise.  He did well.  We will continue to monitor his progression.  Reviewed home exercise with pt today.  Pt plans to walk for exercise.  Reviewed THR, pulse, RPE, sign and symptoms, NTG use, and when to call 911 or MD.  Also discussed weather considerations and indoor options.  Pt voiced understanding.  Thelmer was cleared to return to rehab after his follow up office visit.  He returned today and was able to complete his workloads without a problem.  He was disappointed in himself that he could not exercise for the last week due to a UTI.  He is ready to get back into his home exercise as he has missed his walking regimine.  We will continue to monitor his progression.    Expected Outcomes  Short: Use RPE daily to regulate intensity.  Long: Follow program prescription in THR.  Short: Continue  to attend Cardiac Rehab.  Long: Follow program prescription.   Short - Pt will add one day per week outside program sessions Long -Pt will exercise independently  Short: Continue to attend rehab regularly.  Long: Continue to exercise regularly.        Discharge Exercise Prescription (Final Exercise Prescription Changes): Exercise Prescription Changes - 06/28/17 1400      Response to Exercise   Blood Pressure (Admit)  124/56    Blood Pressure (Exercise)  124/56    Blood Pressure (Exit)  118/60    Heart Rate (Admit)  93 bpm    Heart Rate (Exercise)  107 bpm    Heart Rate (Exit)  91 bpm    Rating of Perceived Exertion (Exercise)  12    Symptoms  none    Duration  Continue with 45 min of aerobic exercise without signs/symptoms of physical distress.     Intensity  THRR unchanged      Progression   Progression  Continue to progress workloads to maintain intensity without signs/symptoms of physical distress.    Average METs  2.43      Resistance Training   Training Prescription  Yes    Weight  5 lbs    Reps  10-15      Interval Training   Interval Training  No      Recumbant Bike   Level  2    Watts  16    Minutes  15    METs  2.47      Track   Laps  30    Minutes  15    METs  2.38      Home Exercise Plan   Plans to continue exercise at  Home (comment) walk or at the mall    Frequency  Add 1 additional day to program exercise sessions.    Initial Home Exercises Provided  06/07/17       Nutrition:  Target Goals: Understanding of nutrition guidelines, daily intake of sodium <1537m, cholesterol <2020m calories 30% from fat and 7% or less from saturated fats, daily to have 5 or more servings of fruits and vegetables.  Biometrics: Pre Biometrics - 05/21/17 1225      Pre Biometrics   Height  5' 9.25" (1.759 m)    Weight  223 lb 6.4 oz (101.3 kg)    Waist Circumference  43.5 inches    Hip Circumference  41 inches    Waist to Hip Ratio  1.06 %    BMI (Calculated)  32.75    Single Leg Stand  1.22 seconds        Nutrition Therapy Plan and Nutrition Goals: Nutrition Therapy & Goals - 05/21/17 1221      Nutrition Therapy   Drug/Food Interactions  Statins/Certain Fruits       Nutrition Discharge: Rate Your Plate Scores: Nutrition Assessments - 05/21/17 1221      MEDFICTS Scores   Pre Score  24       Nutrition Goals Re-Evaluation: Nutrition Goals Re-Evaluation    Row Name 06/28/17 1418             Goals   Current Weight  220 lb (99.8 kg)       Nutrition Goal  Meet with dietician and talk about appetite       Comment  Scheduled an appt with dietician during class on Jan 3 to talk about improving his appetite.       Expected Outcome  Short: Meet  with dietician  Long: Follow recommendations set bu  dietician          Nutrition Goals Discharge (Final Nutrition Goals Re-Evaluation): Nutrition Goals Re-Evaluation - 06/28/17 1418      Goals   Current Weight  220 lb (99.8 kg)    Nutrition Goal  Meet with dietician and talk about appetite    Comment  Scheduled an appt with dietician during class on Jan 3 to talk about improving his appetite.    Expected Outcome  Short: Meet with dietician  Long: Follow recommendations set bu dietician       Psychosocial: Target Goals: Acknowledge presence or absence of significant depression and/or stress, maximize coping skills, provide positive support system. Participant is able to verbalize types and ability to use techniques and skills needed for reducing stress and depression.   Initial Review & Psychosocial Screening: Initial Psych Review & Screening - 05/21/17 1224      Initial Review   Current issues with  Current Stress Concerns    Source of Stress Concerns  Chronic Illness;Transportation    Comments  Jerol moved from [place] recently to be closer to family. He has recently moved in with his sister and brother in law who have a 77 year old autistic son in a group home.  Cable says he misses his 40 year old Grandchildren that he took care of every other weekend for his son and daughter in law. Edword admits that was his reason to quit drinking 13 years ago. He said he had his first heart attack 13 years ago about a month after he quit drinking a pint of liquor on Friday and a 5th or 2 on Saturday night. Exander recently retired from working at [employer] for [employer] years and said he has been in and out of the hospital every since. Jsiah reports that he has never smoked. Arkel said he used to go to a large mall outside of [place] and sometimes walk 10 miles but is frustrated since he has still had heart problems including his recenlty NSTEMI/PTCA.  Monterius said he doesn't eat like he should and isn't hungry much. He reports  his blood sugars in the am are sometimes 250 but sometimes 120. Discussed maybe eating something since he takes long acting Metformin. The short acting Metformin is listed as an allergy since it causes diarrhea so much. Dreux carries his NTG sl and knows to discard it once it is opened per his MD he said he discards it after 6 months. He has a new bottle ready to start next week. Kharon given infor about [employer] and bus line in case his family can't drive him. Khadim said he really doesn't want to drive at times since he has vertigo at times      [place]?  Yes      Barriers   Psychosocial barriers to participate in program  The patient should benefit from training in stress management and relaxation.      Screening Interventions   Interventions  Yes;Encouraged to exercise;To provide support and resources with identified psychosocial needs    Expected Outcomes  Short Term goal: Utilizing psychosocial counselor, staff and physician to assist with identification of specific Stressors or current issues interfering with healing process. Setting desired goal for each stressor or current issue identified.;Long Term Goal: Stressors or current issues are controlled or eliminated.;Short Term goal: Identification and review with participant of any Quality of Life or Depression  concerns found by scoring the questionnaire.;Long Term goal: The participant improves quality of Life and PHQ9 Scores as seen by post scores and/or verbalization of changes       Quality of Life Scores:  Quality of Life - 05/21/17 1225      Quality of Life Scores   Health/Function Pre  18.43 %    Socioeconomic Pre  25.64 %    Psych/Spiritual Pre  24 %    Family Pre  27 %    GLOBAL Pre  22.18 %       PHQ-9: Recent Review Flowsheet Data    Depression screen [facility] 2/9 05/21/2017 05/11/2017 04/30/2017   Decreased Interest 0 0 0   Down, Depressed, Hopeless 0 0 0   PHQ - 2 Score 0 0 0   Altered  sleeping 1 - -   Tired, decreased energy 1 - -   Change in appetite 1 - -   Feeling bad or failure about yourself  0 - -   Trouble concentrating 0 - -   Moving slowly or fidgety/restless 2 - -   Suicidal thoughts 0 - -   PHQ-9 Score 5 - -   Difficult doing work/chores Somewhat difficult - -     Interpretation of Total Score  Total Score Depression Severity:  1-4 = Minimal depression, 5-9 = Mild depression, 10-14 = Moderate depression, 15-19 = Moderately severe depression, 20-27 = Severe depression   Psychosocial Evaluation and Intervention: Psychosocial Evaluation - 05/23/17 0940      Psychosocial Evaluation & Interventions   Interventions  Encouraged to exercise with the program and follow exercise prescription;Relaxation education;Stress management education    Comments  Counselor met with Mr. Tompson Shen) today for initial psychosocial evaluation.  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  He reports having been in the hospital 5x since August with chest pain, etc.  He reports the last stent two weeks ago has made the most change in his increased energy levels and mood.  Sabrina has other health issues with diabetes, colitis, diverticulitis, vertigo and high blood pressure.  He reports sleeping well - sometimes "too much."  He finally got his appetite back recently and he was pleased about that the day before Thanksgiving!  Kennet denies a history of depression or anxiety or any current symptoms and he reports his mood is generally positive most of the time.  Kamarii has multiple stressors with moving from [place] to [place] this past August.  His health is also an issue.  But he mostly is missing his twin 39 year old grandchildren in [place] currently.  He has goals to lose weight; be able to walk up to 10 miles a day - as he reports doing often prior to his cardiact issues; and he would like to feel more  energy and less tired.  Staff will follow with Juanda Crumble throughout the course of this program.      Expected Outcomes  Onyekachi will benefit from consistent exercise to achieve his stated goals.  He will be meeting with the dietician to address his weight loss goals.  The educational and psychoeducational components of this program will be helpful in [place] learning more about his condition and positive ways to cope.  Staff will follow.    Continue Psychosocial Services   Follow up required by staff       Psychosocial Re-Evaluation: Psychosocial  Re-Evaluation    Row Name 06/07/17 1252 06/28/17 1422           Psychosocial Re-Evaluation   Current issues with  Current Stress Concerns  Current Stress Concerns      Comments  Unable to afford Ranexa since $700/month so Spencer stopped that and increased his Imdur 50m. CBornais going to verify with Dr. AFletcher Anonif he is suppose to ahv 924mImdur.   Markos's health has continued to be his biggest stressor.  He feels that he does not have a lot of other stress and tries not to let anything get to him too much.  He has also been disappointed that he has not been able to exercise as much as he would like to do.  His sleep continues to be a struggle because he is constantly getting up to go to the bathroom throughout the night.  He tries to make the best of it.  He has found the exercise in the program to be very helpful and is helping get healthier as well.       Expected Outcomes  -  Short: Continue to attend rehab regularly. Long: Continue to work to improving health.      Interventions  -  Encouraged to attend Cardiac Rehabilitation for the exercise;Stress management education      Continue Psychosocial Services   -  Follow up required by staff        Initial Review   Source of Stress Concerns  -  Unable to perform yard/household activities;Chronic Illness;Unable to participate in former interests or hobbies;Transportation          Psychosocial Discharge (Final Psychosocial Re-Evaluation): Psychosocial Re-Evaluation - 06/28/17 1422      Psychosocial Re-Evaluation   Current issues with  Current Stress Concerns    Comments  Eddy's health has continued to be his biggest stressor.  He feels that he does not have a lot of other stress and tries not to let anything get to him too much.  He has also been disappointed that he has not been able to exercise as much as he would like to do.  His sleep continues to be a struggle because he is constantly getting up to go to the bathroom throughout the night.  He tries to make the best of it.  He has found the exercise in the program to be very helpful and is helping get healthier as well.     Expected Outcomes  Short: Continue to attend rehab regularly. Long: Continue to work to improving health.    Interventions  Encouraged to attend Cardiac Rehabilitation for the exercise;Stress management education    Continue Psychosocial Services   Follow up required by staff      Initial Review   Source of Stress Concerns  Unable to perform yard/household activities;Chronic Illness;Unable to participate in former interests or hobbies;Transportation       Vocational Rehabilitation: Provide vocational rehab assistance to qualifying candidates.   Vocational Rehab Evaluation & Intervention: Vocational Rehab - 05/21/17 1206      Initial Vocational Rehab Evaluation & Intervention   Assessment shows need for Vocational Rehabilitation  No       Education: Education Goals: Education classes will be provided on a variety of topics geared toward better understanding of heart health and risk factor modification. Participant will state understanding/return demonstration of topics presented as noted by education test scores.  Learning Barriers/Preferences: Learning Barriers/Preferences - 05/21/17 1204  Learning Barriers/Preferences   Learning Barriers  Hearing    Learning Preferences   Group Instruction       Education Topics: General Nutrition Guidelines/Fats and Fiber: -Group instruction provided by verbal, written material, models and posters to present the general guidelines for heart healthy nutrition. Gives an explanation and review of dietary fats and fiber.   Controlling Sodium/Reading Food Labels: -Group verbal and written material supporting the discussion of sodium use in heart healthy nutrition. Review and explanation with models, verbal and written materials for utilization of the food label.   Exercise Physiology & Risk Factors: - Group verbal and written instruction with models to review the exercise physiology of the cardiovascular system and associated critical values. Details cardiovascular disease risk factors and the goals associated with each risk factor.   Cardiac Rehab from 06/28/2017 in [facility] Cardiac and Pulmonary Rehab  Date  06/28/17  Educator  [facility]  Instruction Review Code  1- Verbalizes Understanding      Aerobic Exercise & Resistance Training: - Gives group verbal and written discussion on the health impact of inactivity. On the components of aerobic and resistive training programs and the benefits of this training and how to safely progress through these programs.   Flexibility, Balance, General Exercise Guidelines: - Provides group verbal and written instruction on the benefits of flexibility and balance training programs. Provides general exercise guidelines with specific guidelines to those with heart or lung disease. Demonstration and skill practice provided.   Stress Management: - Provides group verbal and written instruction about the health risks of elevated stress, cause of high stress, and healthy ways to reduce stress.   Depression: - Provides group verbal and written instruction on the correlation between heart/lung disease and depressed mood, treatment options, and the stigmas associated with seeking treatment.   Cardiac  Rehab from 06/28/2017 in [facility] Cardiac and Pulmonary Rehab  Date  06/07/17  Educator  [facility]  Instruction Review Code  1- Verbalizes Understanding      Anatomy & Physiology of the Heart: - Group verbal and written instruction and models provide basic cardiac anatomy and physiology, with the coronary electrical and arterial systems. Review of: AMI, Angina, Valve disease, Heart Failure, Cardiac Arrhythmia, Pacemakers, and the ICD.   Cardiac Procedures: - Group verbal and written instruction to review commonly prescribed medications for heart disease. Reviews the medication, class of the drug, and side effects. Includes the steps to properly store meds and maintain the prescription regimen. (beta blockers and nitrates)   Cardiac Medications I: - Group verbal and written instruction to review commonly prescribed medications for heart disease. Reviews the medication, class of the drug, and side effects. Includes the steps to properly store meds and maintain the prescription regimen.   Cardiac Medications II: -Group verbal and written instruction to review commonly prescribed medications for heart disease. Reviews the medication, class of the drug, and side effects. (all other drug classes)    Go Sex-Intimacy & Heart Disease, Get SMART - Goal Setting: - Group verbal and written instruction through game format to discuss heart disease and the return to sexual intimacy. Provides group verbal and written material to discuss and apply goal setting through the application of the S.M.A.R.T. Method.   Other Matters of the Heart: - Provides group verbal, written materials and models to describe Heart Failure, Angina, Valve Disease, Peripheral Artery Disease, and Diabetes in the realm of heart disease. Includes description of the disease process and treatment options available to the cardiac patient.  Exercise & Equipment Safety: - Individual verbal instruction and demonstration of equipment use and  safety with use of the equipment.   Cardiac Rehab from 06/28/2017 in [facility] Cardiac and Pulmonary Rehab  Date  05/21/17  Educator  C.EnterkinRN  Instruction Review Code  3- Needs Reinforcement      Infection Prevention: - Provides verbal and written material to individual with discussion of infection control including proper hand washing and proper equipment cleaning during exercise session.   Cardiac Rehab from 06/28/2017 in [facility] Cardiac and Pulmonary Rehab  Date  05/21/17  Educator  Loletha Grayer ENterkinRN  Instruction Review Code  1- Verbalizes Understanding      Falls Prevention: - Provides verbal and written material to individual with discussion of falls prevention and safety.   Cardiac Rehab from 06/28/2017 in [facility] Cardiac and Pulmonary Rehab  Date  05/21/17  Educator  C. ENterkinRN  Instruction Review Code  1- Verbalizes Understanding      Diabetes: - Individual verbal and written instruction to review signs/symptoms of diabetes, desired ranges of glucose level fasting, after meals and with exercise. Acknowledge that pre and post exercise glucose checks will be done for 3 sessions at entry of program.   Cardiac Rehab from 06/28/2017 in [facility] Cardiac and Pulmonary Rehab  Date  05/21/17  Educator  C. Griggs  Instruction Review Code  3- Needs Reinforcement      Other: -Provides group and verbal instruction on various topics (see comments)    Knowledge Questionnaire Score: Knowledge Questionnaire Score - 05/23/17 0821      Knowledge Questionnaire Score   Pre Score  14/28 Reviewed with patient       Core Components/Risk Factors/Patient Goals at Admission: Personal Goals and Risk Factors at Admission - 05/21/17 1224      Core Components/Risk Factors/Patient Goals on Admission    Weight Management  Yes;Obesity;Weight Loss    Intervention  Weight Management: Develop a combined nutrition and exercise program designed to reach desired caloric intake, while maintaining  appropriate intake of nutrient and fiber, sodium and fats, and appropriate energy expenditure required for the weight goal.;Weight Management: Provide education and appropriate resources to help participant work on and attain dietary goals.;Weight Management/Obesity: Establish reasonable short term and long term weight goals.    Admit Weight  223 lb 6.4 oz (101.3 kg)    Goal Weight: Short Term  218 lb (98.9 kg)    Goal Weight: Long Term  213 lb (96.6 kg)    Expected Outcomes  Short Term: Continue to assess and modify interventions until short term weight is achieved;Long Term: Adherence to nutrition and physical activity/exercise program aimed toward attainment of established weight goal;Weight Loss: Understanding of general recommendations for a balanced deficit meal plan, which promotes 1-2 lb weight loss per week and includes a negative energy balance of [phone] kcal/d;Understanding recommendations for meals to include 15-35% energy as protein, 25-35% energy from fat, 35-60% energy from carbohydrates, less than 226m of dietary cholesterol, 20-35 gm of total fiber daily;Understanding of distribution of calorie intake throughout the day with the consumption of 4-5 meals/snacks    Diabetes  Yes    Intervention  Provide education about signs/symptoms and action to take for hypo/hyperglycemia.;Provide education about proper nutrition, including hydration, and aerobic/resistive exercise prescription along with prescribed medications to achieve blood glucose in normal ranges: Fasting glucose 65-99 mg/dL    Expected Outcomes  Short Term: Participant verbalizes understanding of the signs/symptoms and immediate care of hyper/hypoglycemia, proper foot care and importance of  medication, aerobic/resistive exercise and nutrition plan for blood glucose control.;Long Term: Attainment of HbA1C < 7%.    Heart Failure  Yes    Intervention  Provide a combined exercise and nutrition program that is supplemented with  education, support and counseling about heart failure. Directed toward relieving symptoms such as shortness of breath, decreased exercise tolerance, and extremity edema.    Expected Outcomes  Improve functional capacity of life;Short term: Attendance in program 2-3 days a week with increased exercise capacity. Reported lower sodium intake. Reported increased fruit and vegetable intake. Reports medication compliance.;Short term: Daily weights obtained and reported for increase. Utilizing diuretic protocols set by physician.;Long term: Adoption of self-care skills and reduction of barriers for early signs and symptoms recognition and intervention leading to self-care maintenance.    Hypertension  Yes    Intervention  Provide education on lifestyle modifcations including regular physical activity/exercise, weight management, moderate sodium restriction and increased consumption of fresh fruit, vegetables, and low fat dairy, alcohol moderation, and smoking cessation.;Monitor prescription use compliance.    Expected Outcomes  Short Term: Continued assessment and intervention until BP is < 140/23m HG in hypertensive participants. < 130/872mHG in hypertensive participants with diabetes, heart failure or chronic kidney disease.;Long Term: Maintenance of blood pressure at goal levels.    Lipids  Yes    Intervention  Provide education and support for participant on nutrition & aerobic/resistive exercise along with prescribed medications to achieve LDL <7036mHDL >35m43m  Expected Outcomes  Short Term: Participant states understanding of desired cholesterol values and is compliant with medications prescribed. Participant is following exercise prescription and nutrition guidelines.;Long Term: Cholesterol controlled with medications as prescribed, with individualized exercise RX and with personalized nutrition plan. Value goals: LDL < 70mg71mL > 40 mg.    Stress  Yes    Intervention  Offer individual and/or small  group education and counseling on adjustment to heart disease, stress management and health-related lifestyle change. Teach and support self-help strategies.;Refer participants experiencing significant psychosocial distress to appropriate mental health specialists for further evaluation and treatment. When possible, include family members and significant others in education/counseling sessions.    Expected Outcomes  Short Term: Participant demonstrates changes in health-related behavior, relaxation and other stress management skills, ability to obtain effective social support, and compliance with psychotropic medications if prescribed.;Long Term: Emotional wellbeing is indicated by absence of clinically significant psychosocial distress or social isolation.       Core Components/Risk Factors/Patient Goals Review:  Goals and Risk Factor Review    Row Name 06/28/17 1348             Core Components/Risk Factors/Patient Goals Review   Personal Goals Review  Weight Management/Obesity;Heart Failure;Hypertension;Diabetes;Lipids       Review  CharlTeobeen doing well in rehab.  His weight has been holding steady around 220 lbs.  He has not had any heart failure sysmtoms and continues to monitor his salt and fluid intake daily.  His blood pressure has been good, but he continues get lightheaded but not sure if could be his vertigo or something else.  He also continues to monitor his blood sugars daily.  He recently had a low of 89 fasting, which is the best it has been in a long time.         Expected Outcomes  Short: Continue to track weight and heart failure symptoms.  Long: Continue to work on risk factor modifications.  Core Components/Risk Factors/Patient Goals at Discharge (Final Review):  Goals and Risk Factor Review - 06/28/17 1348      Core Components/Risk Factors/Patient Goals Review   Personal Goals Review  Weight Management/Obesity;Heart Failure;Hypertension;Diabetes;Lipids     Review  Remer has been doing well in rehab.  His weight has been holding steady around 220 lbs.  He has not had any heart failure sysmtoms and continues to monitor his salt and fluid intake daily.  His blood pressure has been good, but he continues get lightheaded but not sure if could be his vertigo or something else.  He also continues to monitor his blood sugars daily.  He recently had a low of 89 fasting, which is the best it has been in a long time.      Expected Outcomes  Short: Continue to track weight and heart failure symptoms.  Long: Continue to work on risk factor modifications.        ITP Comments: ITP Comments    Row Name 05/21/17 1212 06/05/17 1040 06/06/17 0600 06/07/17 1249 06/14/17 0931   ITP Comments  Zyler moved from [place] recently to be closer to family. He has recently moved in with his sister and brother in law who have a 1 year old autistic son in a group home.  Teejay says he misses his 59 year old Grandchildren that he took care of every other weekend for his son and daughter in law. Cleophas admits that was his reason to quit drinking 13 years ago. He said he had his first heart attack 13 years ago about a month after he quit drinking a pint of liquor on Friday and a 5th or 2 on Saturday night. Kriss recently retired from working at [employer] for [employer] years and said he has been in and out of the hospital every since. Dontrae reports that he has never smoked. Gavyn said he used to go to a large mall outside of [place] and sometimes walk 10 miles but is frustrated since he has still had heart problems including his recenlty NSTEMI/PTCA.  Amad said he doesn't eat like he should and isn't hungry much. He reports his blood sugars in the am are sometimes 250 but sometimes 120. Discussed maybe eating something since he takes long acting Metformin. The short acting Metformin is listed as an allergy since it causes diarrhea so much. Nero carries his NTG sl  and knows to discard it once it is opened per his MD he said he discards it after 6 months. He has a new bottle ready to start next week. Baker given infor about [employer] and bus line in case his family can't drive him. Ivin said he really doesn't want to drive at times since he has vertigo at times.   Niko recieved clearance to return to rehab this morning.  He was able to exercise without any problems.   30 day review. Continue with ITP unless directed changes per Medical Director review.   Per ER report yesterday :Niccolo Burggraf Sr. is a 67 y.o. male with a history of colitis, diverticulitis as well as diabetes and coronary artery disease on Plavix who is presenting to the emergency department today with one episode of what he describes as "coffee ground stool."  He says that he has had 7 episodes of diarrhea today.  No recent antibiotics but multiple recent hospitalizations over the past several weeks.  Says that he has some diffuse abdominal cramping which is intermittent.  No pain at  this time.  No nausea or vomiting.  Just discharged recently for congestive heart failure.  Says that he was discharged on Ranexa and has been experiencing some intermittent episodes of chest pain that are relieved by nitroglycerin.  However, he has been unable to afford the Ranexa and so has not been taking it.  He does take Imdur, 30 mg daily.  From Emerg Dept note today: The patient was at cardiac rehab, when he had a sharp chest pain that went from the lateral axillary line around towards the midline on the left side.  He then developed a lightheaded sensation with severe nausea but no vomiting.  He had associated cold and clammy feeling.  No palpitations, no syncope.  The patient states that his chest pain has now turned into a "dull ache."  He took one nitroglycerin without significant improvement.  He has not had any of his daily medications, including his aspirin.  The patient also reports that Friday, Saturday, and  Sunday night, he was awoken from his sleep with chest pain and took 3 nitroglycerin overnight each night at approximately 2-hour intervals which improved his pain.  The patient was seen and evaluated here 12/5 for concern of "coffee-ground stool."  He was found to have guaiac negative testing and stable blood counts and was discharged home with a plan for outpatient GI follow-up.  The patient has not had any further black or tarry, or bloody stools.   [place] Name 07/04/17 0649           ITP Comments  30 day review. Continue with ITP unless directed changes per Medical Director review.           Comments:

## 2017-07-04 NOTE — Progress Notes (Signed)
Patient ID: Christopher Ivory Sr., male    DOB: 1951-03-12, 67 y.o.   MRN: [id number]  HPI  Christopher Cummings is a 67 y/o male with a history of CAD, colitis, DM, diverticulitis, hyperlipidemia, HTN, remote alcohol use and chronic heart failure.   Echo report from 04/02/17 reviewed and showed an EF of 35-40% along with mild Christopher/TR. PCI with drug-eluting stent placed 05/08/17 in the proximal LCX.   Catheterization done 06/15/17 showed significant underlying three-vessel CARD with patent stent in the RCA with mild in-stent restenosis. No significant change in coronary anatomy since prior catheterization. Cardiac catheterization done 04/02/17 showed multivessel disease with balloon intervention done.   Was in the ED 06/25/17 due to generalized weakness. Diagnosed with a UTI and he was treated and released. Admitted 06/14/17 due to unstable angina. Cardiology consult was obtained and cardiac catheterization was done which stable cardiac anatomy. Discharged after 2 days. Was in the ED 06/06/17 due to diarrhea where he was treated and released. Admitted 05/31/17 due to unstable angina. Cardiology consult was obtained, medications were adjusted and he was discharged the next day.  Admitted 05/07/17 due to NSETMI. Cardiology consult obtained. Catheterization done per above. Discharged home after 2 days. Admitted 04/23/17 due to chest pain. Cardiology consult obtained. Medications were adjusted and he was was discharged home after 3 days. Admitted 03/31/17 due to neck/arm/chest pain. Cardiology consult was obtained with cutting balloon intervention done. Discharged home after 3 days. Admitted 03/12/17 due to rectal bleeding. GI consult obtained. Colonoscopy scheduled as an outpatient and he was discharged home after 2 days.   He presents today for a follow-up visit with a chief complaint of moderate fatigue upon minimal exertion. He describes this as chronic in nature having been present for several years with varying levels of  severity. He has associated head congestion, palpitations, easy bruising and difficulty sleeping. He denies any chest pain, shortness of breath, cough, edema, dizziness or weight gain. Says that as long as he stays on the ranexa, he doesn't have chest pain. Has enough samples to last him 2 more weeks.   Past Medical History:  Diagnosis Date  . Aortic stenosis    a. 07/2015 s/p bioprosthetic AVR (#23 [employer]) - [place]; b. 04/2017 Echo: nl fxn'ing AoV.  Marland Kitchen CAD (coronary artery disease)    a. 2009 PCI->D1; b. 07/2015 CABG x 2 reported (LIMA-LAD noted on cath 01/2017) [facility] - [place]; c. 2017/2018 Prox/Dist LCX & D1 stenting; d. 01/2017 PCI: ISR prox/dist LCX stents (PTCA), ISR D1 (med Rx); e. 04/2017 PCI: CBA for ISR- LCX 90p, 70d, D1 95; f. 05/2017 PCI: D1 99 ISR, LCX 70p (2.25x22 On[employer] DES); g. 06/2017 Cath: Patent LCX and RCA stents. D1 100 ISR->Med Rx.  . Chronic systolic CHF (congestive heart failure) ([place])    a. 04/2017 Echo: EF 35-40% with nl functioning bioprosthetic AoV, mild Christopher, nl PA.  . Colitis   . Diabetes mellitus with complication ([place])   . Diverticulitis   . Essential hypertension   . Hyperlipidemia   . Ischemic cardiomyopathy    a. 04/2017 Echo: EF 35-40%.  . Rectal bleeding    a. 03/2017 -> f/u @ [facility].   Past Surgical History:  Procedure Laterality Date  . AORTIC VALVE REPLACEMENT    . CORONARY ARTERY BYPASS GRAFT    . CORONARY BALLOON ANGIOPLASTY N/A 04/02/2017   Procedure: CORONARY BALLOON ANGIOPLASTY;  Surgeon: Troy Sine, MD;  Location: [place] [facility];  Service: Cardiovascular;  Laterality: N/A;  . CORONARY STENT INTERVENTION N/A 02/26/2017   Procedure: CORONARY STENT INTERVENTION;  Surgeon: Wellington Hampshire, MD;  Location: [place] [facility];  Service: Cardiovascular;  Laterality: N/A;  . CORONARY STENT INTERVENTION N/A 05/08/2017   Procedure: CORONARY STENT INTERVENTION;  Surgeon: Nelva Bush, MD;  Location: [place] [facility];  Service:  Cardiovascular;  Laterality: N/A;  . CORONARY/GRAFT ANGIOGRAPHY N/A 02/26/2017   Procedure: CORONARY/GRAFT ANGIOGRAPHY;  Surgeon: Wellington Hampshire, MD;  Location: [place] [facility];  Service: Cardiovascular;  Laterality: N/A;  . CORONARY/GRAFT ANGIOGRAPHY N/A 04/02/2017   Procedure: CORONARY/GRAFT ANGIOGRAPHY;  Surgeon: Troy Sine, MD;  Location: [place] [facility];  Service: Cardiovascular;  Laterality: N/A;  . CORONARY/GRAFT ANGIOGRAPHY N/A 05/08/2017   Procedure: CORONARY/GRAFT ANGIOGRAPHY;  Surgeon: Nelva Bush, MD;  Location: [place] [facility];  Service: Cardiovascular;  Laterality: N/A;  . JOINT REPLACEMENT Left    KNEE  . KNEE ARTHROPLASTY  2000  . LEFT HEART CATH AND CORONARY ANGIOGRAPHY N/A 06/15/2017   Procedure: LEFT HEART CATH AND CORONARY ANGIOGRAPHY;  Surgeon: Wellington Hampshire, MD;  Location: [place] [facility];  Service: Cardiovascular;  Laterality: N/A;   Family History  Problem Relation Age of Onset  . CAD Mother   . Heart failure Mother   . Lupus Mother   . CAD Brother   . Heart attack Brother   . Prostate cancer Father   . Diabetes Sister   . Healthy Paternal Grandmother   . Prostate cancer Paternal Grandfather   . Healthy Brother   . Healthy Sister    Social History   Tobacco Use  . Smoking status: Never Smoker  . Smokeless tobacco: Never Used  Substance Use Topics  . Alcohol use: No    Comment: No Alcohol 12 years, but drank 2 "bottles of liquor" each week for several years prior to quitting   Allergies  Allergen Reactions  . Metformin And Related Other (See Comments)    Only the regular Metformin (diarrhea)   Prior to Admission medications   Medication Sig Start Date End Date Taking? Authorizing Provider  acetaminophen (TYLENOL) 325 MG tablet Take 2 tablets (650 mg total) by mouth every 6 (six) hours as needed for mild pain (or Fever >/= 101). 04/26/17  Yes Gouru, Illene Silver, MD  aspirin EC 81 MG tablet Take 1 tablet (81 mg total) by mouth  daily. 06/13/17  Yes Theora Gianotti, NP  atorvastatin (LIPITOR) 80 MG tablet Take 1 tablet (80 mg total) by mouth at bedtime. 04/30/17  Yes [name], NP  blood glucose meter kit and supplies Dispense based on patient and insurance preference. Use 1 time daily as directed. (FOR ICD-9 250.00, 250.01). 04/30/17  Yes [name], NP  Blood Pressure Monitoring (BLOOD PRESSURE CUFF) MISC 1 Units by Does not apply route daily. 04/30/17  Yes [name], NP  cephALEXin (KEFLEX) 500 MG capsule Take 500 mg by mouth 4 (four) times daily.   Yes [provider]  clopidogrel (PLAVIX) 75 MG tablet Take 1 tablet (75 mg total) daily by mouth. 05/21/17  Yes Dunn, Areta Haber, PA-C  digoxin (LANOXIN) 0.125 MG tablet Take 0.5 tablets (0.0625 mg total) by mouth daily. 06/05/17 06/05/18 Yes Dunn, Areta Haber, PA-C  docusate sodium (COLACE) 100 MG capsule Take 1 capsule (100 mg total) by mouth 2 (two) times daily. 06/29/17  Yes [name], NP  empagliflozin (JARDIANCE) 25 MG TABS tablet Take 25 mg daily by mouth.  05/15/17  Yes [name], NP  isosorbide mononitrate (IMDUR) 60 MG 24 hr tablet Take 1 tablet (60 mg total) by mouth daily. 06/13/17 09/11/17 Yes Theora Gianotti, NP  meclizine (ANTIVERT) 25 MG tablet Take 25 mg by mouth 3 (three) times daily as needed for dizziness.   Yes [provider]  metFORMIN (GLUCOPHAGE-XR) 500 MG 24 hr tablet Take 1 tablet (500 mg total) daily with breakfast by mouth. 05/15/17 05/15/18 Yes [name], NP  metoprolol succinate (TOPROL-XL) 50 MG 24 hr tablet Take one tablet (50 mg) daily if systolic blood pressure is >100 06/21/17  Yes Theora Gianotti, NP  nitroGLYCERIN (NITROSTAT) 0.4 MG SL tablet Place 1 tablet (0.4 mg total) under the tongue every 5 (five) minutes as needed for chest pain. 07/02/17  Yes Wellington Hampshire, MD  ondansetron (ZOFRAN ODT) 4 MG disintegrating tablet Take 1  tablet (4 mg total) by mouth every 8 (eight) hours as needed for nausea or vomiting. 06/25/17  Yes Alfred Levins, [place], MD  pantoprazole (PROTONIX) 40 MG tablet Take 1 tablet (40 mg total) daily by mouth. 05/21/17  Yes Dunn, Areta Haber, PA-C  ranolazine (RANEXA) 1000 MG SR tablet Take 1 tablet (1,000 mg total) by mouth 2 (two) times daily. 06/13/17  Yes Theora Gianotti, NP  spironolactone (ALDACTONE) 25 MG tablet Take 0.5 tablets (12.5 mg total) daily by mouth. 05/21/17 08/19/17 Yes Dunn, Areta Haber, PA-C   Review of Systems  Constitutional: Positive for fatigue. Negative for appetite change.  HENT: Positive for congestion. Negative for postnasal drip and sore throat.   Eyes: Negative.   Respiratory: Negative for cough, chest tightness and shortness of breath.   Cardiovascular: Positive for palpitations (intermittent). Negative for chest pain and leg swelling.  Gastrointestinal: Negative for abdominal distention and abdominal pain.  Endocrine: Negative.   Genitourinary: Negative.   Musculoskeletal: Negative for back pain and neck pain.  Skin: Negative.   Allergic/Immunologic: Negative.   Neurological: Negative for dizziness and light-headedness.  Hematological: Negative for adenopathy. Bruises/bleeds easily.  Psychiatric/Behavioral: Positive for sleep disturbance (wakes up frequently due to nocturia). Negative for dysphoric mood. The patient is not nervous/anxious.    Vitals:   07/04/17 0901  BP: 122/68  Pulse: 80  Resp: 18  SpO2: 98%  Weight: 219 lb 6 oz (99.5 kg)  Height: _0  (1.753 m)   Wt Readings from Last 3 Encounters:  07/04/17 219 lb 6 oz (99.5 kg)  06/29/17 217 lb 12.8 oz (98.8 kg)  06/25/17 219 lb (99.3 kg)    Lab Results  Component Value Date   CREATININE 0.84 06/25/2017   CREATININE 0.82 06/14/2017   CREATININE 0.78 06/13/2017   Physical Exam  Constitutional: He is oriented to person, place, and time. He appears well-developed and well-nourished.  HENT:   Head: Normocephalic and atraumatic.  Neck: Normal range of motion. Neck supple. No JVD present.  Cardiovascular: Normal rate and regular rhythm.  Pulmonary/Chest: Effort normal. He has no wheezes. He has no rales.  Abdominal: Soft. He exhibits no distension. There is no tenderness.  Musculoskeletal: He exhibits no edema or tenderness.  Neurological: He is alert and oriented to person, place, and time.  Skin: Skin is warm and dry.  Psychiatric: He has a normal mood and affect. His behavior is normal. Thought content normal.  Nursing note and vitals reviewed.  Assessment & Plan:  1: Chronic heart failure with reduced ejection fraction- - NYHA class III - euvolemic today - weighing daily and  says that his weight has been stable. Instructed to call for an overnight weight gain of >2 pounds or a weekly weight gain of >5 pounds - not adding salt to his food. Reviewed the importance of keeping daily sodium intake to <2070m daily  - patient reports receiving his flu vaccine for this season already - saw cardiology (Sharolyn Douglas 06/21/17  2: HTN-  - has been taken off lisinopril due to hypotension; may not be able to tolerate entresto - BP looks good today - BMP from 06/25/17 reviewed and shows sodium 136, potassium 3.7 and GFR >60 - saw PCP (Merrilyn Puma 06/29/17  3: CAD- - has had recent intervention done - continues to have unstable angina resulting in numerous visits to the ED/admissions when he runs out of ranexa - runs out of ranexa frequently due to cost of medication; encouraged him to stop at cardiology office today to see if they have more samples - participating in cardiac rehab  4: Diabetes- - continues on metformin and jardiance - A1c on 05/07/17 was 9.3% - glucose at home yesterday was 168 - 14 tablets of jardiance 269mgiven to patient. Lot [id number] 01/2019  Medication list was reviewed.  Return in 3 months or sooner for any questions/problems before then.

## 2017-07-04 NOTE — Patient Instructions (Signed)
Continue weighing daily and call for an overnight weight gain of > 2 pounds or a weekly weight gain of >5 pounds. 

## 2017-07-05 ENCOUNTER — Encounter: Payer: BLUE CROSS/BLUE SHIELD | Attending: Cardiovascular Disease

## 2017-07-05 DIAGNOSIS — I2510 Atherosclerotic heart disease of native coronary artery without angina pectoris: Secondary | ICD-10-CM | POA: Diagnosis not present

## 2017-07-05 DIAGNOSIS — E119 Type 2 diabetes mellitus without complications: Secondary | ICD-10-CM | POA: Insufficient documentation

## 2017-07-05 DIAGNOSIS — Z7984 Long term (current) use of oral hypoglycemic drugs: Secondary | ICD-10-CM | POA: Diagnosis not present

## 2017-07-05 DIAGNOSIS — Z9861 Coronary angioplasty status: Secondary | ICD-10-CM

## 2017-07-05 DIAGNOSIS — Z7982 Long term (current) use of aspirin: Secondary | ICD-10-CM | POA: Diagnosis not present

## 2017-07-05 DIAGNOSIS — Z7902 Long term (current) use of antithrombotics/antiplatelets: Secondary | ICD-10-CM | POA: Diagnosis not present

## 2017-07-05 DIAGNOSIS — I252 Old myocardial infarction: Secondary | ICD-10-CM | POA: Diagnosis not present

## 2017-07-05 DIAGNOSIS — E785 Hyperlipidemia, unspecified: Secondary | ICD-10-CM | POA: Diagnosis not present

## 2017-07-05 DIAGNOSIS — Z955 Presence of coronary angioplasty implant and graft: Secondary | ICD-10-CM | POA: Diagnosis present

## 2017-07-05 DIAGNOSIS — I10 Essential (primary) hypertension: Secondary | ICD-10-CM | POA: Diagnosis not present

## 2017-07-05 DIAGNOSIS — Z79899 Other long term (current) drug therapy: Secondary | ICD-10-CM | POA: Insufficient documentation

## 2017-07-05 DIAGNOSIS — I214 Non-ST elevation (NSTEMI) myocardial infarction: Secondary | ICD-10-CM

## 2017-07-05 NOTE — Progress Notes (Signed)
Daily Session Note  Patient Details  Name: Christopher Nance Sr. MRN: [id number] Date of Birth: 06/21/1951 Referring Provider:     Cardiac Rehab from 05/21/2017 in [facility] Cardiac and Pulmonary Rehab  Referring Provider  Kathlyn Sacramento MD      Encounter Date: 07/05/2017  Check In: Session Check In - 07/05/17 0844      Check-In   Location  [facility]-Cardiac & Pulmonary Rehab    Staff Present  Nada Maclachlan, BA, ACSM CEP, Exercise Physiologist;Carroll Enterkin, RN, Levie Heritage, MA, ACSM RCEP, Exercise Physiologist    Supervising physician immediately available to respond to emergencies  See telemetry face sheet for immediately available ER MD    Medication changes reported      No    Fall or balance concerns reported     No    Warm-up and Cool-down  Performed on first and last piece of equipment    Resistance Training Performed  Yes    VAD Patient?  No      Pain Assessment   Currently in Pain?  No/denies    Multiple Pain Sites  No          Social History   Tobacco Use  Smoking Status Never Smoker  Smokeless Tobacco Never Used    Goals Met:  Independence with exercise equipment Exercise tolerated well No report of cardiac concerns or symptoms Strength training completed today  Goals Unmet:  Not Applicable  Comments: Pt able to follow exercise prescription today without complaint.  Will continue to monitor for progression.    Dr. Emily Filbert is Medical Director for [facility] and [facility].

## 2017-07-09 ENCOUNTER — Telehealth: Payer: Self-pay | Admitting: Cardiovascular Disease

## 2017-07-09 ENCOUNTER — Other Ambulatory Visit: Payer: Self-pay

## 2017-07-09 MED ORDER — DIGOXIN 125 MCG PO TABS: 0.0625 mg | ORAL_TABLET | Freq: Every day | ORAL | 0 refills | Status: DC

## 2017-07-09 NOTE — Telephone Encounter (Signed)
Refill sent for Digoxin 125 mcg.

## 2017-07-09 NOTE — Telephone Encounter (Signed)
°*  STAT* If patient is at the pharmacy, call can be transferred to refill team.   1. Which medications need to be refilled? (please list name of each medication and dose if known)  Renexa  2. Which pharmacy/location (including street and city if local pharmacy) is medication to be sent to? [facility]  3. Do they need a 30 day or 90 day supply? 90 day

## 2017-07-10 ENCOUNTER — Other Ambulatory Visit: Payer: Self-pay | Admitting: *Deleted

## 2017-07-10 ENCOUNTER — Telehealth: Payer: Self-pay | Admitting: Cardiovascular Disease

## 2017-07-10 ENCOUNTER — Encounter: Payer: BLUE CROSS/BLUE SHIELD | Admitting: *Deleted

## 2017-07-10 DIAGNOSIS — Z955 Presence of coronary angioplasty implant and graft: Secondary | ICD-10-CM | POA: Diagnosis not present

## 2017-07-10 DIAGNOSIS — I214 Non-ST elevation (NSTEMI) myocardial infarction: Secondary | ICD-10-CM

## 2017-07-10 DIAGNOSIS — Z9861 Coronary angioplasty status: Secondary | ICD-10-CM

## 2017-07-10 MED ORDER — RANOLAZINE ER 1000 MG PO TB12: 1000.0000 mg | ORAL_TABLET | Freq: Two times a day (BID) | ORAL | 0 refills | Status: DC

## 2017-07-10 NOTE — Telephone Encounter (Signed)
Patient in lobby and states [facility]   Patient needs samples .  Marina aware.

## 2017-07-10 NOTE — Telephone Encounter (Signed)
Requested Prescriptions  ° °Signed Prescriptions Disp Refills  °• ranolazine (RANEXA) 1000 MG SR tablet 180 tablet 0  °  Sig: Take 1 tablet (1,000 mg total) by mouth 2 (two) times daily.  °  Authorizing Provider: BERGE, CHRISTOPHER RONALD  °  Ordering User: Dashonda Bonneau C  ° ° °

## 2017-07-10 NOTE — Telephone Encounter (Signed)
Pt has been given samples until pt is able to be approved for PA. He is aware that PA will be done and sent in. Pt is aware that processing can take up to 72 hours for approval. I gave pt patient assistance forms for Ranexa program as well. He mentioned being in a financial bind and is unable to afford medication.

## 2017-07-10 NOTE — Progress Notes (Signed)
Daily Session Note  Patient Details  Name: Christopher Potenza Sr. MRN: [id number] Date of Birth: Feb 28, 1951 Referring Provider:     Cardiac Rehab from [facility]/21/2017 in [facility]  Referring Provider  Kathlyn Sacramento MD      Encounter Date: 07/10/2017  Check In: Session Check In - 07/10/17 0841      Check-In   Location  [facility]-Cardiac & Pulmonary Rehab    Staff Present  Nada Maclachlan, BA, ACSM CEP, Exercise Physiologist;Susanne Bice, RN, BSN, CCRP;Joseph Darrin Nipper, [place], ACSM RCEP, Exercise Physiologist    Supervising physician immediately available to respond to emergencies  See telemetry face sheet for immediately available ER MD    Medication changes reported      No    Fall or balance concerns reported     No    Warm-up and Cool-down  Performed on first and last piece of equipment    Resistance Training Performed  Yes    VAD Patient?  No      Pain Assessment   Currently in Pain?  No/denies          Social History   Tobacco Use  Smoking Status Never Smoker  Smokeless Tobacco Never Used    Goals Met:  Independence with exercise equipment Exercise tolerated well No report of cardiac concerns or symptoms Strength training completed today  Goals Unmet:  Not Applicable  Comments: Pt able to follow exercise prescription today without complaint.  Will continue to monitor for progression.    Dr. Emily Filbert is Medical Director for [facility] and [facility].

## 2017-07-10 NOTE — Telephone Encounter (Signed)
Requested Prescriptions  ° °Signed Prescriptions Disp Refills  °• ranolazine (RANEXA) 1000 MG SR tablet 180 tablet 0  °  Sig: Take 1 tablet (1,000 mg total) by mouth 2 (two) times daily.  °  Authorizing Provider: BERGE, CHRISTOPHER RONALD  °  Ordering User: Cruzito Standre C  ° ° °

## 2017-07-10 NOTE — Telephone Encounter (Signed)
Your information has been submitted to [facility] [place]. [employer] [place] will review the request and fax you a determination directly, typically within 3 business days of your submission once all necessary information is received.

## 2017-07-10 NOTE — Telephone Encounter (Signed)
°*  STAT* If patient is at the pharmacy, call can be transferred to refill team.   1. Which medications need to be refilled? (please list name of each medication and dose if known) Ranexa 1000 mg PO BID   2. Which pharmacy/location (including street and city if local pharmacy) is medication to be sent to? [facility]   3. Do they need a 30 day or 90 day supply? 90

## 2017-07-11 ENCOUNTER — Other Ambulatory Visit: Payer: Self-pay

## 2017-07-11 MED ORDER — DIGOXIN 125 MCG PO TABS: 0.0625 mg | ORAL_TABLET | Freq: Every day | ORAL | 0 refills | Status: DC

## 2017-07-12 DIAGNOSIS — Z9861 Coronary angioplasty status: Secondary | ICD-10-CM

## 2017-07-12 DIAGNOSIS — Z955 Presence of coronary angioplasty implant and graft: Secondary | ICD-10-CM | POA: Diagnosis not present

## 2017-07-12 DIAGNOSIS — I214 Non-ST elevation (NSTEMI) myocardial infarction: Secondary | ICD-10-CM

## 2017-07-12 LAB — GLUCOSE, CAPILLARY: GLUCOSE-CAPILLARY: 178 mg/dL — AB (ref 65–99)

## 2017-07-12 NOTE — Telephone Encounter (Signed)
Pt has been approved for Ranexa 1000 mg tablet. 07/10/2017-07/02/2038.

## 2017-07-12 NOTE — Progress Notes (Signed)
Daily Session Note  Patient Details  Name: Christopher Lyness Sr. MRN: [id number] Date of Birth: 12/07/1950 Referring Provider:     Cardiac Rehab from 05/21/2017 in [facility] Cardiac and Pulmonary Rehab  Referring Provider  Arida, Muhammad MD      Encounter Date: 07/12/2017  Check In: Session Check In - 07/12/17 0847      Check-In   Location  [facility]iac [facility]    Staff Present  Amanda Sommer, BA, ACSM CEP, Exercise Physiologist;Carroll Enterkin, RN, BSN;Jessica Hawkins, MA, ACSM RCEP, Exercise Physiologist    Supervising physician immediately available to respond to emergencies  See telemetry face sheet for immediately available ER MD    Medication changes reported      No    Fall or balance concerns reported     No    Warm-up and Cool-down  Performed on first and last piece of equipment    Resistance Training Performed  Yes    VAD Patient?  No      Pain Assessment   Currently in Pain?  No/denies    Multiple Pain Sites  No          Social History   Tobacco Use  Smoking Status Never Smoker  Smokeless Tobacco Never Used    Goals Met:  Independence with exercise equipment Exercise tolerated well No report of cardiac concerns or symptoms Strength training completed today  Goals Unmet:  Not Applicable  Comments: [name] had trouble with vertigo today and could not exercise as much.  He said he had some chest pain that was anxiety.  He stayed for the length of session and got in some intermittent exercise.  He was not able to do resistance training today. He called his ride to also bring his medication with to pick him up.  After resting for a bit, he was able to walk out on his own to meet his ride.    Dr. Mark Miller is Medical Director for [facility] and [facility]. 

## 2017-07-13 ENCOUNTER — Other Ambulatory Visit: Payer: Self-pay

## 2017-07-13 MED ORDER — NITROGLYCERIN 0.4 MG SL SUBL: 0.4000 mg | SUBLINGUAL_TABLET | SUBLINGUAL | 0 refills | Status: AC | PRN

## 2017-07-17 ENCOUNTER — Other Ambulatory Visit: Payer: Self-pay | Admitting: *Deleted

## 2017-07-17 DIAGNOSIS — Z955 Presence of coronary angioplasty implant and graft: Secondary | ICD-10-CM | POA: Diagnosis not present

## 2017-07-17 DIAGNOSIS — I214 Non-ST elevation (NSTEMI) myocardial infarction: Secondary | ICD-10-CM

## 2017-07-17 DIAGNOSIS — Z9861 Coronary angioplasty status: Secondary | ICD-10-CM

## 2017-07-17 NOTE — Progress Notes (Signed)
Daily Session Note  Patient Details  Name: Christopher Welford Sr. MRN: [id number] Date of Birth: 01/18/1951 Referring Provider:     Cardiac Rehab from 05/21/2017 in [facility] Cardiac and Pulmonary Rehab  Referring Provider  Arida, Muhammad MD      Encounter Date: 07/17/2017  Check In: Session Check In - 07/17/17 0843      Check-In   Location  [facility]    Staff Present  Jessica Hawkins, MA, RCEP, CCRP, Exercise Physiologist;Amanda Sommer, BA, ACSM CEP, Exercise Physiologist;Susanne Bice, RN, BSN, CCRP    Supervising physician immediately available to respond to emergencies  See telemetry face sheet for immediately available ER MD    Medication changes reported      No    Fall or balance concerns reported     No    Warm-up and Cool-down  Performed on first and last piece of equipment    Resistance Training Performed  Yes    VAD Patient?  No      Pain Assessment   Currently in Pain?  No/denies    Multiple Pain Sites  No          Social History   Tobacco Use  Smoking Status Never Smoker  Smokeless Tobacco Never Used    Goals Met:  Independence with exercise equipment Exercise tolerated well No report of cardiac concerns or symptoms Strength training completed today  Goals Unmet:  Not Applicable  Comments: Pt able to follow exercise prescription today without complaint.  Will continue to monitor for progression.    Dr. Mark Miller is Medical Director for [facility] and [facility]. 

## 2017-07-18 ENCOUNTER — Other Ambulatory Visit: Payer: Self-pay

## 2017-07-18 ENCOUNTER — Observation Stay
Admission: EM | Admit: 2017-07-18 | Discharge: 2017-07-20 | Disposition: A | Discharge status: Home / Self Care | Payer: BLUE CROSS/BLUE SHIELD | Attending: Internal Medicine | Admitting: Internal Medicine

## 2017-07-18 ENCOUNTER — Telehealth: Payer: Self-pay | Admitting: Cardiology

## 2017-07-18 ENCOUNTER — Encounter: Payer: Self-pay | Admitting: Emergency Medicine

## 2017-07-18 ENCOUNTER — Emergency Department: Payer: BLUE CROSS/BLUE SHIELD

## 2017-07-18 DIAGNOSIS — I10 Essential (primary) hypertension: Secondary | ICD-10-CM | POA: Diagnosis present

## 2017-07-18 DIAGNOSIS — Z8249 Family history of ischemic heart disease and other diseases of the circulatory system: Secondary | ICD-10-CM | POA: Insufficient documentation

## 2017-07-18 DIAGNOSIS — I481 Persistent atrial fibrillation: Secondary | ICD-10-CM | POA: Insufficient documentation

## 2017-07-18 DIAGNOSIS — Z955 Presence of coronary angioplasty implant and graft: Secondary | ICD-10-CM | POA: Insufficient documentation

## 2017-07-18 DIAGNOSIS — R0789 Other chest pain: Secondary | ICD-10-CM | POA: Diagnosis present

## 2017-07-18 DIAGNOSIS — R748 Abnormal levels of other serum enzymes: Secondary | ICD-10-CM | POA: Diagnosis not present

## 2017-07-18 DIAGNOSIS — R51 Headache: Secondary | ICD-10-CM

## 2017-07-18 DIAGNOSIS — I255 Ischemic cardiomyopathy: Secondary | ICD-10-CM | POA: Insufficient documentation

## 2017-07-18 DIAGNOSIS — Z96652 Presence of left artificial knee joint: Secondary | ICD-10-CM | POA: Insufficient documentation

## 2017-07-18 DIAGNOSIS — I350 Nonrheumatic aortic (valve) stenosis: Secondary | ICD-10-CM | POA: Insufficient documentation

## 2017-07-18 DIAGNOSIS — M549 Dorsalgia, unspecified: Secondary | ICD-10-CM

## 2017-07-18 DIAGNOSIS — I5022 Chronic systolic (congestive) heart failure: Secondary | ICD-10-CM | POA: Insufficient documentation

## 2017-07-18 DIAGNOSIS — Z952 Presence of prosthetic heart valve: Secondary | ICD-10-CM

## 2017-07-18 DIAGNOSIS — I2510 Atherosclerotic heart disease of native coronary artery without angina pectoris: Secondary | ICD-10-CM | POA: Insufficient documentation

## 2017-07-18 DIAGNOSIS — Z7902 Long term (current) use of antithrombotics/antiplatelets: Secondary | ICD-10-CM | POA: Insufficient documentation

## 2017-07-18 DIAGNOSIS — Z951 Presence of aortocoronary bypass graft: Secondary | ICD-10-CM | POA: Diagnosis not present

## 2017-07-18 DIAGNOSIS — E119 Type 2 diabetes mellitus without complications: Secondary | ICD-10-CM | POA: Insufficient documentation

## 2017-07-18 DIAGNOSIS — R519 Headache, unspecified: Secondary | ICD-10-CM

## 2017-07-18 DIAGNOSIS — I110 Hypertensive heart disease with heart failure: Secondary | ICD-10-CM | POA: Insufficient documentation

## 2017-07-18 DIAGNOSIS — I252 Old myocardial infarction: Secondary | ICD-10-CM | POA: Diagnosis not present

## 2017-07-18 DIAGNOSIS — I482 Chronic atrial fibrillation: Secondary | ICD-10-CM | POA: Insufficient documentation

## 2017-07-18 DIAGNOSIS — I214 Non-ST elevation (NSTEMI) myocardial infarction: Secondary | ICD-10-CM

## 2017-07-18 DIAGNOSIS — Z7982 Long term (current) use of aspirin: Secondary | ICD-10-CM | POA: Diagnosis not present

## 2017-07-18 DIAGNOSIS — E785 Hyperlipidemia, unspecified: Secondary | ICD-10-CM | POA: Insufficient documentation

## 2017-07-18 DIAGNOSIS — Z79899 Other long term (current) drug therapy: Secondary | ICD-10-CM | POA: Insufficient documentation

## 2017-07-18 DIAGNOSIS — I440 Atrioventricular block, first degree: Secondary | ICD-10-CM

## 2017-07-18 DIAGNOSIS — R079 Chest pain, unspecified: Principal | ICD-10-CM | POA: Insufficient documentation

## 2017-07-18 DIAGNOSIS — Z7984 Long term (current) use of oral hypoglycemic drugs: Secondary | ICD-10-CM | POA: Diagnosis not present

## 2017-07-18 DIAGNOSIS — K219 Gastro-esophageal reflux disease without esophagitis: Secondary | ICD-10-CM | POA: Diagnosis present

## 2017-07-18 HISTORY — DX: Permanent atrial fibrillation: I48.21

## 2017-07-18 LAB — BASIC METABOLIC PANEL
ANION GAP: 11 (ref 5–15)
BUN: 17 mg/dL (ref 6–20)
CHLORIDE: 100 mmol/L — AB (ref 101–111)
CO2: 26 mmol/L (ref 22–32)
Calcium: 9.6 mg/dL (ref 8.9–10.3)
Creatinine, Ser: 0.9 mg/dL (ref 0.61–1.24)
GFR calc non Af Amer: >60 mL/min (ref >60)
Glucose, Bld: 175 mg/dL — ABNORMAL HIGH (ref 65–99)
Potassium: 3.9 mmol/L (ref 3.5–5.1)
Sodium: 137 mmol/L (ref 135–145)

## 2017-07-18 LAB — CBC
HCT: 44.7 % (ref 40.0–52.0)
HEMOGLOBIN: 14.8 g/dL (ref 13.0–18.0)
MCH: 27.1 pg (ref 26.0–34.0)
MCHC: 33 g/dL (ref 32.0–36.0)
MCV: 82.2 fL (ref 80.0–100.0)
Platelets: 232 10*3/uL (ref 150–440)
RBC: 5.45 MIL/uL (ref 4.40–5.90)
RDW: 16.5 % — ABNORMAL HIGH (ref 11.5–14.5)
WBC: 11.1 10*3/uL — ABNORMAL HIGH (ref 3.8–10.6)

## 2017-07-18 LAB — TROPONIN I: Troponin I: 0.05 ng/mL (ref <0.03)

## 2017-07-18 MED ORDER — GI COCKTAIL ~~LOC~~
30.0000 mL | Freq: Once | ORAL | Status: DC
  Filled 2017-07-18: qty 30

## 2017-07-18 MED ORDER — ACETAMINOPHEN 500 MG PO TABS
1000.0000 mg | ORAL_TABLET | Freq: Once | ORAL | Status: AC
  Administered 2017-07-18: 1000 mg via ORAL
  Filled 2017-07-18: qty 2

## 2017-07-18 MED ORDER — HEPARIN BOLUS VIA INFUSION
4000.0000 [IU] | Freq: Once | INTRAVENOUS | Status: AC
  Administered 2017-07-18: 4000 [IU] via INTRAVENOUS
  Filled 2017-07-18: qty 4000

## 2017-07-18 MED ORDER — HEPARIN SODIUM (PORCINE) 5000 UNIT/ML IJ SOLN: 4000.0000 [IU] | Freq: Once | INTRAMUSCULAR | Status: DC

## 2017-07-18 MED ORDER — NITROGLYCERIN 0.4 MG SL SUBL
0.4000 mg | SUBLINGUAL_TABLET | SUBLINGUAL | Status: DC | PRN
  Administered 2017-07-18: 0.4 mg via SUBLINGUAL

## 2017-07-18 MED ORDER — HEPARIN (PORCINE) IN NACL 100-0.45 UNIT/ML-% IJ SOLN: 12.0000 [IU]/kg/h | INTRAMUSCULAR | Status: DC

## 2017-07-18 MED ORDER — NITROGLYCERIN 0.4 MG SL SUBL
SUBLINGUAL_TABLET | SUBLINGUAL | Status: AC
  Administered 2017-07-18: 0.4 mg
  Filled 2017-07-18: qty 1

## 2017-07-18 MED ORDER — HEPARIN (PORCINE) IN NACL 100-0.45 UNIT/ML-% IJ SOLN
1100.0000 [IU]/h | INTRAMUSCULAR | Status: DC
  Administered 2017-07-18 – 2017-07-19 (×2): 1100 [IU]/h via INTRAVENOUS
  Filled 2017-07-18 (×2): qty 250

## 2017-07-18 MED ORDER — ASPIRIN 81 MG PO CHEW
324.0000 mg | CHEWABLE_TABLET | Freq: Once | ORAL | Status: AC
  Administered 2017-07-18: 324 mg via ORAL
  Filled 2017-07-18: qty 4

## 2017-07-18 NOTE — ED Triage Notes (Signed)
Pt comes into the ED via POV c/o central chest pain that started around 3:30 today with the feeling of reflux and a pill stuck.  Patient states the discomfort radiates to his shoulder and neck and he has nausea.  Patient has even and unlabored respirations and is ambulatory to triage at this time.  Patient has also described a headache at this time. Patient has CHF, MI, cardiac stents and valve replacements.

## 2017-07-18 NOTE — Telephone Encounter (Signed)
Received page from patient.  He states that he has been nauseous, and has had significant chest tightness and discomfort in his back for the past 3-4 hours.  Given these new complaints, I recommended that he present to the emergency department for evaluation.  Patient agrees with this plan and will present for further workup and management.  Christopher PlantsJaidip Semone Orlov, MD

## 2017-07-18 NOTE — ED Provider Notes (Signed)
[facility] Emergency Department Provider Note  ____________________________________________  Time seen: Approximately 9:34 PM  I have reviewed the triage vital signs and the nursing notes.   HISTORY  Chief Complaint Chest Pain    HPI Christopher Loux Sr. is a 67 y.o. male with a history of CAD status post multiple MIs, ischemic cardiomyopathy, CHF, on Plavix and low-dose aspirin, presenting with chest pain.  The patient reports that he woke up from a nap around 2:30 PM with a sensation of something feeling "stuck" from the epigastrium to the middle of the chest.  He also had posterior neck and head pain.  He did not have any associated diaphoresis, nausea or vomiting, palpitations, lightheadedness or syncope.  This did not feel like his prior MI nor his prior GERD.  It was not related to food.  He did feel worse when he laid back down.  The patient attempted one sublingual nitroglycerin which did not help his pain.  The patient has taken a low-dose aspirin today.  He continues to be symptomatic at this time.  Past Medical History:  Diagnosis Date  . Aortic stenosis    a. 07/2015 s/p bioprosthetic AVR (#23 [employer]) - [place]; b. 04/2017 Echo: nl fxn'ing AoV.  Marland Kitchen CAD (coronary artery disease)    a. 2009 PCI->D1; b. 07/2015 CABG x 2 reported (LIMA-LAD noted on cath 01/2017) [facility] - [place]; c. 2017/2018 Prox/Dist LCX & D1 stenting; d. 01/2017 PCI: ISR prox/dist LCX stents (PTCA), ISR D1 (med Rx); e. 04/2017 PCI: CBA for ISR- LCX 90p, 70d, D1 95; f. 05/2017 PCI: D1 99 ISR, LCX 70p (2.25x22 Onyx DES); g. 06/2017 Cath: Patent LCX and RCA stents. D1 100 ISR->Med Rx.  . Chronic systolic CHF (congestive heart failure) (HCC)    a. 04/2017 Echo: EF 35-40% with nl functioning bioprosthetic AoV, mild MR, nl PA.  . Colitis   . Diabetes mellitus with complication (HCC)   . Diverticulitis   . Essential hypertension   . Hyperlipidemia   . Ischemic cardiomyopathy    a.  04/2017 Echo: EF 35-40%.  . Rectal bleeding    a. 03/2017 -> f/u @ [facility].    Patient Active Problem List   Diagnosis Date Noted  . Chest tightness 05/31/2017  . Gastroesophageal reflux disease 05/29/2017  . Chronic systolic heart failure (HCC) 05/12/2017  . NSTEMI (non-ST elevated myocardial infarction) (HCC) 05/07/2017  . H/O prosthetic aortic valve replacement 04/01/2017  . Unstable angina (HCC) 03/31/2017  . HTN (hypertension) 03/12/2017  . Diabetes (HCC) 03/12/2017  . CAD (coronary artery disease) 03/12/2017  . History of non-ST elevation myocardial infarction (NSTEMI) 02/24/2017    Past Surgical History:  Procedure Laterality Date  . AORTIC VALVE REPLACEMENT    . CORONARY ARTERY BYPASS GRAFT    . CORONARY BALLOON ANGIOPLASTY N/A 04/02/2017   Procedure: CORONARY BALLOON ANGIOPLASTY;  Surgeon: Lennette Bihari, MD;  Location: [facility] INVASIVE CV LAB;  Service: Cardiovascular;  Laterality: N/A;  . CORONARY STENT INTERVENTION N/A 02/26/2017   Procedure: CORONARY STENT INTERVENTION;  Surgeon: Iran Ouch, MD;  Location: [facility] INVASIVE CV LAB;  Service: Cardiovascular;  Laterality: N/A;  . CORONARY STENT INTERVENTION N/A 05/08/2017   Procedure: CORONARY STENT INTERVENTION;  Surgeon: Yvonne Kendall, MD;  Location: [facility] INVASIVE CV LAB;  Service: Cardiovascular;  Laterality: N/A;  . CORONARY/GRAFT ANGIOGRAPHY N/A 02/26/2017   Procedure: CORONARY/GRAFT ANGIOGRAPHY;  Surgeon: Iran Ouch, MD;  Location: [facility] INVASIVE CV LAB;  Service: Cardiovascular;  Laterality:  N/A;  . CORONARY/GRAFT ANGIOGRAPHY N/A 04/02/2017   Procedure: CORONARY/GRAFT ANGIOGRAPHY;  Surgeon: Lennette BihariKelly, Thomas A, MD;  [facility];  Service: Cardiovascular;  Laterality: N/A;  . CORONARY/GRAFT ANGIOGRAPHY N/A 05/08/2017   Procedure: CORONARY/GRAFT ANGIOGRAPHY;  Surgeon: Yvonne KendallEnd, Christopher, MD;  Location: [facility] INVASIVE CV LAB;  Service: Cardiovascular;  Laterality: N/A;  . JOINT REPLACEMENT Left    KNEE  .  KNEE ARTHROPLASTY  2000  . LEFT HEART CATH AND CORONARY ANGIOGRAPHY N/A 06/15/2017   Procedure: LEFT HEART CATH AND CORONARY ANGIOGRAPHY;  Surgeon: Iran OuchArida, Muhammad A, MD;  Location: [facility] INVASIVE CV LAB;  Service: Cardiovascular;  Laterality: N/A;    Current Outpatient Rx  . Order #: [id number] Class: No Print  . Order #: [id number] Class: No Print  . Order #: [id number] Class: Normal  . Order #: [id number] Class: Print  . Order #: [id number] Class: Normal  . Order #: [id number] Class: Historical Med  . Order #: [id number] Class: Normal  . Order #: [id number] Class: Normal  . Order #: [id number] Class: Normal  . Order #: [id number] Class: Normal  . Order #: [id number] Class: Normal  . Order #: [id number] Class: Historical Med  . Order #: [id number] Class: Normal  . Order #: [id number] Class: Normal  . Order #: [id number] Class: Normal  . Order #: [id number] Class: Print  . Order #: [id number] Class: Normal  . Order #: [id number] Class: Normal  . Order #: [id number] Class: Normal    Allergies Metformin and related  Family History  Problem Relation Age of Onset  . CAD Mother   . Heart failure Mother   . Lupus Mother   . CAD Brother   . Heart attack Brother   . Prostate cancer Father   . Diabetes Sister   . Healthy Paternal Grandmother   . Prostate cancer Paternal Grandfather   . Healthy Brother   . Healthy Sister     Social History Social History   Tobacco Use  . Smoking status: Never Smoker  . Smokeless tobacco: Never Used  Substance Use Topics  . Alcohol use: No    Comment: No Alcohol 12 years, but drank 2 "bottles of liquor" each week for several years prior to quitting  . Drug use: No    Review of Systems Constitutional: No fever/chills.  No lightheadedness or syncope.  No recent illness.  No general malaise. Eyes: No visual changes. ENT: No sore throat. No congestion or rhinorrhea. Cardiovascular: Positive chest pain. Denies palpitations. Respiratory: Denies shortness of breath.  No  cough. Gastrointestinal: No abdominal pain.  No nausea, no vomiting.  No diarrhea.  No constipation. Genitourinary: Negative for dysuria. Musculoskeletal: Negative for back pain.  Positive posterior head and neck pain. Skin: Negative for rash. Neurological: + for headache. No focal numbness, tingling or weakness.     ____________________________________________   PHYSICAL EXAM:  VITAL SIGNS: ED Triage Vitals  Enc Vitals Group     BP 07/18/17 2106 126/79     Pulse Rate 07/18/17 2106 80     Resp 07/18/17 2106 18     Temp 07/18/17 2106 97.8 F (36.6 C)     Temp Source 07/18/17 2106 Oral     SpO2 07/18/17 2106 98 %     Weight 07/18/17 2103 219 lb (99.3 kg)     Height 07/18/17 2103 5\' 9"  (1.753 m)     Head Circumference --      Peak Flow --      Pain Score 07/18/17 2103 10     Pain Loc --      Pain Edu? --  Excl. in GC? --     Constitutional: Alert and oriented. Well appearing and in no acute distress. Answers questions appropriately. Eyes: Conjunctivae are normal.  EOMI. No scleral icterus. Head: Atraumatic. Nose: No congestion/rhinnorhea. Mouth/Throat: Mucous membranes are moist.  Neck: No stridor.  Supple.  No JVD.  No meningismus Cardiovascular: Normal rate, regular rhythm. No murmurs, rubs or gallops.  Respiratory: Normal respiratory effort.  No accessory muscle use or retractions. Lungs CTAB.  No wheezes, rales or ronchi. Gastrointestinal: Soft, nontender and nondistended.  No guarding or rebound.  No peritoneal signs. Musculoskeletal: No LE edema. No ttp in the calves or palpable cords.  Negative Homan's sign. Neurologic:  A&Ox3.  Speech is clear.  Face and smile are symmetric.  EOMI.  Moves all extremities well. Skin:  Skin is warm, dry and intact. No rash noted. Psychiatric: Mood and affect are normal. Speech and behavior are normal.  Normal judgement.  ____________________________________________   LABS (all labs ordered are listed, but only abnormal  results are displayed)  Labs Reviewed  BASIC METABOLIC PANEL - Abnormal; Notable for the following components:      Result Value   Chloride 100 (*)    Glucose, Bld 175 (*)    All other components within normal limits  CBC - Abnormal; Notable for the following components:   WBC 11.1 (*)    RDW 16.5 (*)    All other components within normal limits  TROPONIN I - Abnormal; Notable for the following components:   Troponin I 0.05 (*)    All other components within normal limits   ____________________________________________  EKG  ED ECG REPORT I, Rockne Menghini, the attending physician, personally viewed and interpreted this ECG.   Date: 07/18/2017  EKG Time: 2104  Rate: 100  Rhythm: sinus tachycardia; the EKG reads atrial fibrillation but there is a poor baseline tracing and this EKG is most consistent with a sinus rhythm  Axis: normal  Intervals:none  ST&T Change: No STEMI  Repeat EKG: ED ECG REPORT I, Rockne Menghini, the attending physician, personally viewed and interpreted this ECG.   Date: 07/18/2017  EKG Time: 2150  Rate: 86  Rhythm: normal sinus rhythm  Axis: normal  Intervals:first-degree A-V block   ST&T Change: No STEMI  ____________________________________________  RADIOLOGY  Dg Chest 2 View  Result Date: 07/18/2017 CLINICAL DATA:  Chest pain today EXAM: CHEST  2 VIEW COMPARISON:  06/25/2017 FINDINGS: Borderline cardiomegaly. Normal vascularity. Lungs under aerated and grossly clear. No pneumothorax or pleural effusion. Postop changes from aortic valve repair. IMPRESSION: No active cardiopulmonary disease. Electronically Signed   By: Jolaine Click M.D.   On: 07/18/2017 21:25    ____________________________________________   PROCEDURES  Procedure(s) performed: None  Procedures  Critical Care performed: Yes, see critical care note(s) ____________________________________________   INITIAL IMPRESSION / ASSESSMENT AND PLAN / ED  COURSE  Pertinent labs & imaging results that were available during my care of the patient were reviewed by me and considered in my medical decision making (see chart for details).  67 y.o. male with a history significant for CAD status post MI, ischemic cardiomyopathy, CHF, presenting with chest pain that radiates to the neck and the back of the head.  Overall, the patient is hemodynamically stable.  There are multiple possible etiologies including ACS or MI, reflux, peptic ulcer disease.  However, given his history, we will pursue a cardiac cause for the patient's symptoms.  He will be treated with aspirin, nitroglycerin.  ----------------------------------------- 9:44 PM on 07/18/2017 -----------------------------------------  The patient's first troponin is 0.05 and a heparin bolus and infusion have been ordered.  Plan admission at this time.  CRITICAL CARE Performed by: Rockne Menghini   Total critical care time: 35 minutes  Critical care time was exclusive of separately billable procedures and treating other patients.  Critical care was necessary to treat or prevent imminent or life-threatening deterioration.  Critical care was time spent personally by me on the following activities: development of treatment plan with patient and/or surrogate as well as nursing, discussions with consultants, evaluation of patient's response to treatment, examination of patient, obtaining history from patient or surrogate, ordering and performing treatments and interventions, ordering and review of laboratory studies, ordering and review of radiographic studies, pulse oximetry and re-evaluation of patient's condition.   ____________________________________________  FINAL CLINICAL IMPRESSION(S) / ED DIAGNOSES  Final diagnoses:  NSTEMI (non-ST elevated myocardial infarction) (HCC)         NEW MEDICATIONS STARTED DURING THIS VISIT:  New Prescriptions   No medications on file       Rockne Menghini, MD 07/18/17 2211

## 2017-07-18 NOTE — ED Notes (Addendum)
[facility] sister cell [phone] emergency contact

## 2017-07-18 NOTE — ED Notes (Signed)
Date and time results received: 07/18/17 2140 (use smartphrase ".now" to insert current time)  Test:troponin Critical Value: 0.05  Name of Provider Notified:norman  Orders Received? Or Actions Taken?: new orders

## 2017-07-19 ENCOUNTER — Other Ambulatory Visit: Payer: Self-pay

## 2017-07-19 ENCOUNTER — Encounter: Payer: Self-pay | Admitting: Nurse Practitioner

## 2017-07-19 DIAGNOSIS — I214 Non-ST elevation (NSTEMI) myocardial infarction: Secondary | ICD-10-CM

## 2017-07-19 DIAGNOSIS — I483 Typical atrial flutter: Secondary | ICD-10-CM

## 2017-07-19 DIAGNOSIS — R079 Chest pain, unspecified: Secondary | ICD-10-CM

## 2017-07-19 DIAGNOSIS — I25118 Atherosclerotic heart disease of native coronary artery with other forms of angina pectoris: Secondary | ICD-10-CM | POA: Diagnosis not present

## 2017-07-19 DIAGNOSIS — M546 Pain in thoracic spine: Secondary | ICD-10-CM | POA: Diagnosis not present

## 2017-07-19 DIAGNOSIS — K219 Gastro-esophageal reflux disease without esophagitis: Secondary | ICD-10-CM | POA: Diagnosis not present

## 2017-07-19 DIAGNOSIS — I10 Essential (primary) hypertension: Secondary | ICD-10-CM | POA: Diagnosis not present

## 2017-07-19 DIAGNOSIS — I255 Ischemic cardiomyopathy: Secondary | ICD-10-CM | POA: Diagnosis not present

## 2017-07-19 LAB — BASIC METABOLIC PANEL
Anion gap: 10 (ref 5–15)
BUN: 18 mg/dL (ref 6–20)
CALCIUM: 9.2 mg/dL (ref 8.9–10.3)
CHLORIDE: 100 mmol/L — AB (ref 101–111)
CO2: 24 mmol/L (ref 22–32)
CREATININE: 0.88 mg/dL (ref 0.61–1.24)
GFR calc non Af Amer: >60 mL/min (ref >60)
Glucose, Bld: 105 mg/dL — ABNORMAL HIGH (ref 65–99)
Potassium: 3.6 mmol/L (ref 3.5–5.1)
SODIUM: 134 mmol/L — AB (ref 135–145)

## 2017-07-19 LAB — CBC
HCT: 40.5 % (ref 40.0–52.0)
Hemoglobin: 13.6 g/dL (ref 13.0–18.0)
MCH: 27.7 pg (ref 26.0–34.0)
MCHC: 33.5 g/dL (ref 32.0–36.0)
MCV: 82.7 fL (ref 80.0–100.0)
PLATELETS: 201 10*3/uL (ref 150–440)
RBC: 4.9 MIL/uL (ref 4.40–5.90)
RDW: 16.4 % — AB (ref 11.5–14.5)
WBC: 8.5 10*3/uL (ref 3.8–10.6)

## 2017-07-19 LAB — GLUCOSE, CAPILLARY
GLUCOSE-CAPILLARY: 116 mg/dL — AB (ref 65–99)
GLUCOSE-CAPILLARY: 170 mg/dL — AB (ref 65–99)
Glucose-Capillary: 97 mg/dL (ref 65–99)
Glucose-Capillary: 173 mg/dL — ABNORMAL HIGH (ref 65–99)
Glucose-Capillary: 240 mg/dL — ABNORMAL HIGH (ref 65–99)

## 2017-07-19 LAB — HEPARIN LEVEL (UNFRACTIONATED)
HEPARIN UNFRACTIONATED: 0.44 [IU]/mL (ref 0.30–0.70)
Heparin Unfractionated: 0.51 IU/mL (ref 0.30–0.70)

## 2017-07-19 LAB — TROPONIN I
Troponin I: 0.03 ng/mL (ref <0.03)
Troponin I: 0.04 ng/mL (ref <0.03)
Troponin I: 0.06 ng/mL (ref <0.03)

## 2017-07-19 LAB — PROTIME-INR
INR: 1.23
PROTHROMBIN TIME: 15.4 s — AB (ref 11.4–15.2)

## 2017-07-19 LAB — APTT: APTT: 109 s — AB (ref 24–36)

## 2017-07-19 MED ORDER — METFORMIN HCL ER 500 MG PO TB24
500.0000 mg | ORAL_TABLET | Freq: Every day | ORAL | Status: DC
  Filled 2017-07-19: qty 1

## 2017-07-19 MED ORDER — SODIUM CHLORIDE 0.9% FLUSH
3.0000 mL | Freq: Two times a day (BID) | INTRAVENOUS | Status: DC
  Administered 2017-07-19 – 2017-07-20 (×3): 3 mL via INTRAVENOUS

## 2017-07-19 MED ORDER — ASPIRIN EC 81 MG PO TBEC
81.0000 mg | DELAYED_RELEASE_TABLET | Freq: Every day | ORAL | Status: DC
  Administered 2017-07-19 – 2017-07-20 (×2): 81 mg via ORAL
  Filled 2017-07-19 (×2): qty 1

## 2017-07-19 MED ORDER — DOCUSATE SODIUM 100 MG PO CAPS
100.0000 mg | ORAL_CAPSULE | Freq: Two times a day (BID) | ORAL | Status: DC
  Filled 2017-07-19 (×3): qty 1

## 2017-07-19 MED ORDER — ACETAMINOPHEN 325 MG PO TABS: 650.0000 mg | ORAL_TABLET | Freq: Four times a day (QID) | ORAL | Status: DC | PRN

## 2017-07-19 MED ORDER — PANTOPRAZOLE SODIUM 40 MG PO TBEC
40.0000 mg | DELAYED_RELEASE_TABLET | Freq: Every day | ORAL | Status: DC
  Administered 2017-07-19 – 2017-07-20 (×2): 40 mg via ORAL
  Filled 2017-07-19 (×2): qty 1

## 2017-07-19 MED ORDER — SPIRONOLACTONE 12.5 MG HALF TABLET
12.5000 mg | ORAL_TABLET | Freq: Every day | ORAL | Status: DC
  Administered 2017-07-19 – 2017-07-20 (×2): 12.5 mg via ORAL
  Filled 2017-07-19 (×2): qty 1

## 2017-07-19 MED ORDER — ONDANSETRON HCL 4 MG/2ML IJ SOLN: 4.0000 mg | Freq: Four times a day (QID) | INTRAMUSCULAR | Status: DC | PRN

## 2017-07-19 MED ORDER — HYDROCODONE-ACETAMINOPHEN 5-325 MG PO TABS
1.0000 | ORAL_TABLET | ORAL | Status: DC | PRN
  Administered 2017-07-19 – 2017-07-20 (×3): 1 via ORAL
  Filled 2017-07-19 (×3): qty 1

## 2017-07-19 MED ORDER — CLOPIDOGREL BISULFATE 75 MG PO TABS
75.0000 mg | ORAL_TABLET | Freq: Every day | ORAL | Status: DC
  Administered 2017-07-19 – 2017-07-20 (×2): 75 mg via ORAL
  Filled 2017-07-19 (×2): qty 1

## 2017-07-19 MED ORDER — ONDANSETRON HCL 4 MG PO TABS: 4.0000 mg | ORAL_TABLET | Freq: Four times a day (QID) | ORAL | Status: DC | PRN

## 2017-07-19 MED ORDER — GI COCKTAIL ~~LOC~~
30.0000 mL | Freq: Once | ORAL | Status: AC
  Administered 2017-07-19: 30 mL via ORAL
  Filled 2017-07-19: qty 30

## 2017-07-19 MED ORDER — BISACODYL 5 MG PO TBEC: 5.0000 mg | DELAYED_RELEASE_TABLET | Freq: Every day | ORAL | Status: DC | PRN

## 2017-07-19 MED ORDER — INSULIN ASPART 100 UNIT/ML ~~LOC~~ SOLN
0.0000 [IU] | Freq: Three times a day (TID) | SUBCUTANEOUS | Status: DC
  Administered 2017-07-19: 3 [IU] via SUBCUTANEOUS
  Administered 2017-07-19 – 2017-07-20 (×2): 2 [IU] via SUBCUTANEOUS
  Administered 2017-07-20: 1 [IU] via SUBCUTANEOUS
  Filled 2017-07-19 (×4): qty 1

## 2017-07-19 MED ORDER — MECLIZINE HCL 25 MG PO TABS
25.0000 mg | ORAL_TABLET | Freq: Three times a day (TID) | ORAL | Status: DC | PRN
  Filled 2017-07-19: qty 1

## 2017-07-19 MED ORDER — SODIUM CHLORIDE 0.9% FLUSH: 3.0000 mL | INTRAVENOUS | Status: DC | PRN

## 2017-07-19 MED ORDER — METOPROLOL SUCCINATE ER 50 MG PO TB24
50.0000 mg | ORAL_TABLET | Freq: Every day | ORAL | Status: DC
  Administered 2017-07-19 – 2017-07-20 (×2): 50 mg via ORAL
  Filled 2017-07-19 (×2): qty 1

## 2017-07-19 MED ORDER — ISOSORBIDE MONONITRATE ER 60 MG PO TB24
60.0000 mg | ORAL_TABLET | Freq: Every day | ORAL | Status: DC
  Administered 2017-07-19 – 2017-07-20 (×2): 60 mg via ORAL
  Filled 2017-07-19 (×2): qty 1

## 2017-07-19 MED ORDER — DIGOXIN 0.0625 MG HALF TABLET
0.0625 mg | ORAL_TABLET | Freq: Every day | ORAL | Status: DC
  Administered 2017-07-19 – 2017-07-20 (×2): 0.0625 mg via ORAL
  Filled 2017-07-19 (×2): qty 1

## 2017-07-19 MED ORDER — ATORVASTATIN CALCIUM 20 MG PO TABS
80.0000 mg | ORAL_TABLET | Freq: Every day | ORAL | Status: DC
  Administered 2017-07-19: 80 mg via ORAL
  Filled 2017-07-19: qty 4

## 2017-07-19 MED ORDER — RANOLAZINE ER 500 MG PO TB12
1000.0000 mg | ORAL_TABLET | Freq: Two times a day (BID) | ORAL | Status: DC
  Administered 2017-07-19 – 2017-07-20 (×4): 1000 mg via ORAL
  Filled 2017-07-19 (×5): qty 2

## 2017-07-19 MED ORDER — CANAGLIFLOZIN 100 MG PO TABS
100.0000 mg | ORAL_TABLET | Freq: Every day | ORAL | Status: DC
  Filled 2017-07-19: qty 1

## 2017-07-19 MED ORDER — NITROGLYCERIN 0.4 MG SL SUBL: 0.4000 mg | SUBLINGUAL_TABLET | SUBLINGUAL | Status: DC | PRN

## 2017-07-19 NOTE — Progress Notes (Signed)
ANTICOAGULATION CONSULT NOTE - Initial Consult  Pharmacy Consult for heparin Indication: chest pain/ACS  Allergies  Allergen Reactions  . Metformin And Related Other (See Comments)    Only the regular Metformin (diarrhea)    Patient Measurements: Height: 5\' 9"  (175.3 cm) Weight: 205 lb 12.8 oz (93.4 kg) IBW/kg (Calculated) : 70.7 Heparin Dosing Weight: 90 kg  Vital Signs: Temp: 97.5 F (36.4 C) (01/17 0354) Temp Source: Oral (01/17 0354) BP: 120/54 (01/17 0354) Pulse Rate: 71 (01/17 0354)  Labs: Recent Labs    07/18/17 0014 07/18/17 2104 07/19/17 0014 07/19/17 0635  HGB  --  14.8  --  13.6  HCT  --  44.7  --  40.5  PLT  --  232  --  201  APTT 109*  --   --   --   LABPROT 15.4*  --   --   --   INR 1.23  --   --   --   HEPARINUNFRC  --   --   --  0.51  CREATININE  --  0.90  --  0.88  TROPONINI  --  0.05* 0.06* 0.04*    Estimated Creatinine Clearance: 95.7 mL/min (by C-G formula based on SCr of 0.88 mg/dL).   Medical History: Past Medical History:  Diagnosis Date  . Aortic stenosis    a. 07/2015 s/p bioprosthetic AVR (#23 [employer]) - [place]; b. 04/2017 Echo: nl fxn'ing AoV.  Marland Kitchen. CAD (coronary artery disease)    a. 2009 PCI->D1; b. 07/2015 CABG x 2 reported (LIMA-LAD noted on cath 01/2017) [facility] [place]; c. 2017/2018 Prox/Dist LCX & D1 stenting; d. 01/2017 PCI: ISR prox/dist LCX stents (PTCA), ISR D1 (med Rx); e. 04/2017 PCI: CBA for ISR- LCX 90p, 70d, D1 95; f. 05/2017 PCI: D1 99 ISR, LCX 70p (2.25x22 Onyx DES); g. 06/2017 Cath: Patent LCX and RCA stents. D1 100 ISR->Med Rx.  . Chronic systolic CHF (congestive heart failure) (HCC)    a. 04/2017 Echo: EF 35-40% with nl functioning bioprosthetic AoV, mild MR, nl PA.  . Colitis   . Diabetes mellitus with complication (HCC)   . Diverticulitis   . Essential hypertension   . Hyperlipidemia   . Ischemic cardiomyopathy    a. 04/2017 Echo: EF 35-40%.  . Rectal bleeding    a. 03/2017 -> f/u @ [facility].     Medications:  Scheduled:  . aspirin EC  81 mg Oral Daily  . atorvastatin  80 mg Oral QHS  . canagliflozin  100 mg Oral QAC breakfast  . clopidogrel  75 mg Oral Daily  . digoxin  0.0625 mg Oral Daily  . docusate sodium  100 mg Oral BID  . heparin  4,000 Units Intravenous Once  . insulin aspart  0-9 Units Subcutaneous TID WC  . isosorbide mononitrate  60 mg Oral Daily  . metFORMIN  500 mg Oral Q breakfast  . metoprolol succinate  50 mg Oral Daily  . pantoprazole  40 mg Oral Daily  . ranolazine  1,000 mg Oral BID  . sodium chloride flush  3 mL Intravenous Q12H  . spironolactone  12.5 mg Oral Daily    Assessment: Patient admitted for CP w/ trops 0.05 >> 0.06, EKG WNL Patient does not appear to be on any anticoagulants except for DAPT therapy (ASA, plavix) Patient is being started on heparin drip for NSTEMI  Goal of Therapy:  Heparin level 0.3-0.7 units/ml Monitor platelets by anticoagulation protocol: Yes   Plan:  1/17 @0630  HL therapeutic at 0.51. Will Continue current heparin drip rate of 1100 units/hr and recheck HL in 6 hours.   Gardner Candle, PharmD, BCPS Clinical Pharmacist 07/19/2017 7:49 AM

## 2017-07-19 NOTE — H&P (Signed)
Christopher Cummings NAME: Christopher Cummings    MR#:  [id number]  DATE OF BIRTH:  [birth year]  DATE OF ADMISSION:  07/18/2017  PRIMARY CARE PHYSICIAN: [name], NP   REQUESTING/REFERRING PHYSICIAN:   CHIEF COMPLAINT:   Chief Complaint  Patient presents with  . Chest Pain    HISTORY OF PRESENT ILLNESS: Christopher Cummings  is a 67 y.o. male with a known history of CAD, CHF and aortic stenosis; he is status post CABG in the past and aortic valve replacement.  Patient presented to emergency room for acute onset of retrosternal chest pain, constant 6 out of 10 in severity without any radiation.  Pain started at rest and he does not seem to be alleviated or worsened by any factors. Patient feels like he has something stuck in his esophagus. No fever/chills, no cough, no palpitations.  He has been compliant with his medications and just underwent cardiac cath with stent placement recently.  Chest x-ray and EKG , done in the emergency room were noted without any acute changes.  PAST MEDICAL HISTORY:   Past Medical History:  Diagnosis Date  . Aortic stenosis    a. 07/2015 s/p bioprosthetic AVR (#23 [employer]) - [place]; b. 04/2017 Echo: nl fxn'ing AoV.  Marland Kitchen CAD (coronary artery disease)    a. 2009 PCI->D1; b. 07/2015 CABG x 2 reported (LIMA-LAD noted on cath 01/2017) [facility] [place]; c. 2017/2018 Prox/Dist LCX & D1 stenting; d. 01/2017 PCI: ISR prox/dist LCX stents (PTCA), ISR D1 (med Rx); e. 04/2017 PCI: CBA for ISR- LCX 90p, 70d, D1 95; f. 05/2017 PCI: D1 99 ISR, LCX 70p (2.25x22 Onyx DES); g. 06/2017 Cath: Patent LCX and RCA stents. D1 100 ISR->Med Rx.  . Chronic systolic CHF (congestive heart failure) ([place])    a. 04/2017 Echo: EF 35-40% with nl functioning bioprosthetic AoV, mild MR, nl PA.  . Colitis   . Diabetes mellitus with complication ([place])   . Diverticulitis   . Essential hypertension   . Hyperlipidemia   . Ischemic  cardiomyopathy    a. 04/2017 Echo: EF 35-40%.  . Rectal bleeding    a. 03/2017 -> f/u @ [facility].    PAST SURGICAL HISTORY:  Past Surgical History:  Procedure Laterality Date  . AORTIC VALVE REPLACEMENT    . CORONARY ARTERY BYPASS GRAFT    . CORONARY BALLOON ANGIOPLASTY N/A 04/02/2017   Procedure: CORONARY BALLOON ANGIOPLASTY;  Surgeon: Troy Sine, MD;  Location: [place] [facility];  Service: Cardiovascular;  Laterality: N/A;  . CORONARY STENT INTERVENTION N/A 02/26/2017   Procedure: CORONARY STENT INTERVENTION;  Surgeon: Wellington Hampshire, MD;  Location: [place] [facility];  Service: Cardiovascular;  Laterality: N/A;  . CORONARY STENT INTERVENTION N/A 05/08/2017   Procedure: CORONARY STENT INTERVENTION;  Surgeon: Nelva Bush, MD;  Location: [place] [facility];  Service: Cardiovascular;  Laterality: N/A;  . CORONARY/GRAFT ANGIOGRAPHY N/A 02/26/2017   Procedure: CORONARY/GRAFT ANGIOGRAPHY;  Surgeon: Wellington Hampshire, MD;  Location: [place] [facility];  Service: Cardiovascular;  Laterality: N/A;  . CORONARY/GRAFT ANGIOGRAPHY N/A 04/02/2017   Procedure: CORONARY/GRAFT ANGIOGRAPHY;  Surgeon: Troy Sine, MD;  Location: [place] [facility];  Service: Cardiovascular;  Laterality: N/A;  . CORONARY/GRAFT ANGIOGRAPHY N/A 05/08/2017   Procedure: CORONARY/GRAFT ANGIOGRAPHY;  Surgeon: Nelva Bush, MD;  Location: [place] [facility];  Service: Cardiovascular;  Laterality: N/A;  . JOINT REPLACEMENT Left    KNEE  .  KNEE ARTHROPLASTY  2000  . LEFT HEART CATH AND CORONARY ANGIOGRAPHY N/A 06/15/2017   Procedure: LEFT HEART CATH AND CORONARY ANGIOGRAPHY;  Surgeon: Wellington Hampshire, MD;  Location: [place] [facility];  Service: Cardiovascular;  Laterality: N/A;    SOCIAL HISTORY:  Social History   Tobacco Use  . Smoking status: Never Smoker  . Smokeless tobacco: Never Used  Substance Use Topics  . Alcohol use: No    Comment: No Alcohol 12 years, but drank 2 "bottles of liquor"  each week for several years prior to quitting    FAMILY HISTORY:  Family History  Problem Relation Age of Onset  . CAD Mother   . Heart failure Mother   . Lupus Mother   . CAD Brother   . Heart attack Brother   . Prostate cancer Father   . Diabetes Sister   . Healthy Paternal Grandmother   . Prostate cancer Paternal Grandfather   . Healthy Brother   . Healthy Sister     DRUG ALLERGIES:  Allergies  Allergen Reactions  . Metformin And Related Other (See Comments)    Only the regular Metformin (diarrhea)    REVIEW OF SYSTEMS:   CONSTITUTIONAL: No fever; patient complains of fatigue and generalized weakness for the past 24 hours.  EYES: No blurred or double vision.  EARS, NOSE, AND THROAT: No tinnitus or ear pain.  RESPIRATORY: No cough, shortness of breath, wheezing or hemoptysis.  CARDIOVASCULAR: Positive for chest pain; no orthopnea, or edema.  GASTROINTESTINAL: No nausea, vomiting, diarrhea or abdominal pain.  GENITOURINARY: No dysuria, hematuria.  ENDOCRINE: No polyuria, nocturia,  HEMATOLOGY: No easy bruising or bleeding SKIN: No rash or lesion. MUSCULOSKELETAL: No joint pain or arthritis.   NEUROLOGIC: No tingling, numbness, weakness.  PSYCHIATRY: No anxiety or depression.   MEDICATIONS AT HOME:  Prior to Admission medications   Medication Sig Start Date End Date Taking? Authorizing Provider  acetaminophen (TYLENOL) 325 MG tablet Take 2 tablets (650 mg total) by mouth every 6 (six) hours as needed for mild pain (or Fever >/= 101). 04/26/17  Yes Gouru, Illene Silver, MD  aspirin EC 81 MG tablet Take 1 tablet (81 mg total) by mouth daily. 06/13/17  Yes Theora Gianotti, NP  atorvastatin (LIPITOR) 80 MG tablet Take 1 tablet (80 mg total) by mouth at bedtime. 04/30/17  Yes [name], NP  blood glucose meter kit and supplies Dispense based on patient and insurance preference. Use 1 time daily as directed. (FOR ICD-9 250.00, 250.01). 04/30/17  Yes [name], NP  Blood Pressure Monitoring (BLOOD PRESSURE CUFF) MISC 1 Units by Does not apply route daily. 04/30/17  Yes [name], NP  clopidogrel (PLAVIX) 75 MG tablet Take 1 tablet (75 mg total) daily by mouth. 05/21/17  Yes Dunn, Areta Haber, PA-C  digoxin (LANOXIN) 0.125 MG tablet Take 0.5 tablets (0.0625 mg total) by mouth daily. 07/11/17 07/11/18 Yes Gollan, Kathlene November, MD  docusate sodium (COLACE) 100 MG capsule Take 1 capsule (100 mg total) by mouth 2 (two) times daily. 06/29/17  Yes [name], NP  empagliflozin (JARDIANCE) 25 MG TABS tablet Take 25 mg daily by mouth. 05/15/17  Yes [name], NP  isosorbide mononitrate (IMDUR) 60 MG 24 hr tablet Take 1 tablet (60 mg total) by mouth daily. 06/13/17 09/11/17 Yes Theora Gianotti, NP  meclizine (ANTIVERT) 25 MG tablet Take 25 mg by mouth 3 (three) times daily as needed for dizziness.   Yes [provider]  metFORMIN (GLUCOPHAGE-XR) 500 MG 24 hr tablet Take 1 tablet (500 mg total) daily with breakfast by mouth. 05/15/17 05/15/18 Yes Kennedy, Lauren Renee, NP  metoprolol succinate (TOPROL-XL) 50 MG 24 hr tablet Take one tablet (50 mg) daily if systolic blood pressure is >100 06/21/17  Yes Berge, Christopher Ronald, NP  nitroGLYCERIN (NITROSTAT) 0.4 MG SL tablet Place 1 tablet (0.4 mg total) under the tongue every 5 (five) minutes as needed for chest pain. 07/13/17  Yes Arida, Muhammad A, MD  ondansetron (ZOFRAN ODT) 4 MG disintegrating tablet Take 1 tablet (4 mg total) by mouth every 8 (eight) hours as needed for nausea or vomiting. 06/25/17  Yes Veronese, [place], MD  pantoprazole (PROTONIX) 40 MG tablet Take 1 tablet (40 mg total) daily by mouth. 05/21/17  Yes Dunn, Ryan M, PA-C  ranolazine (RANEXA) 1000 MG SR tablet Take 1 tablet (1,000 mg total) by mouth 2 (two) times daily. 07/10/17  Yes Arida, Muhammad A, MD  spironolactone (ALDACTONE) 25 MG tablet Take 0.5 tablets (12.5 mg total) daily by mouth.  05/21/17 08/19/17 Yes Dunn, Ryan M, PA-C      PHYSICAL EXAMINATION:   VITAL SIGNS: Blood pressure 124/69, pulse 69, temperature 97.7 F (36.5 C), temperature source Oral, resp. rate 17, height 5' 9" (1.753 m), weight 93.4 kg (205 lb 12.8 oz), SpO2 93 %.  GENERAL:  66 y.o.-year-old patient lying in the bed with no acute distress.  EYES: Pupils equal, round, reactive to light and accommodation. No scleral icterus. Extraocular muscles intact.  HEENT: Head atraumatic, normocephalic. Oropharynx and nasopharynx clear.  NECK:  Supple, no jugular venous distention. No thyroid enlargement, no tenderness.  LUNGS: Normal breath sounds bilaterally, no wheezing, rales,rhonchi or crepitation. No use of accessory muscles of respiration.  CARDIOVASCULAR: S1, S2 normal. No murmurs, rubs, or gallops.  ABDOMEN: Soft, nontender, nondistended. Bowel sounds present. No organomegaly or mass.  EXTREMITIES: No pedal edema, cyanosis, or clubbing.  NEUROLOGIC: No focal weakness. Sensation intact. Gait not checked.  PSYCHIATRIC: The patient is alert and oriented x 3.  SKIN: No obvious rash, lesion, or ulcer.   LABORATORY PANEL:   CBC Recent Labs  Lab 07/18/17 2104  WBC 11.1*  HGB 14.8  HCT 44.7  PLT 232  MCV 82.2  MCH 27.1  MCHC 33.0  RDW 16.5*   ------------------------------------------------------------------------------------------------------------------  Chemistries  Recent Labs  Lab 07/18/17 2104  NA 137  K 3.9  CL 100*  CO2 26  GLUCOSE 175*  BUN 17  CREATININE 0.90  CALCIUM 9.6   ------------------------------------------------------------------------------------------------------------------ estimated creatinine clearance is 93.6 mL/min (by C-G formula based on SCr of 0.9 mg/dL). ------------------------------------------------------------------------------------------------------------------ No results for input(s): TSH, T4TOTAL, T3FREE, THYROIDAB in the last 72 hours.  Invalid  input(s): FREET3   Coagulation profile Recent Labs  Lab 07/18/17 0014  INR 1.23   ------------------------------------------------------------------------------------------------------------------- No results for input(s): DDIMER in the last 72 hours. -------------------------------------------------------------------------------------------------------------------  Cardiac Enzymes Recent Labs  Lab 07/18/17 2104 07/19/17 0014  TROPONINI 0.05* 0.06*   ------------------------------------------------------------------------------------------------------------------ Invalid input(s): POCBNP  ---------------------------------------------------------------------------------------------------------------  Urinalysis    Component Value Date/Time   COLORURINE YELLOW (A) 06/25/2017 1926   APPEARANCEUR CLEAR (A) 06/25/2017 1926   LABSPEC 1.033 (H) 06/25/2017 1926   PHURINE 5.0 06/25/2017 1926   GLUCOSEU >=500 (A) 06/25/2017 1926   HGBUR NEGATIVE 06/25/2017 1926   BILIRUBINUR NEGATIVE 06/25/2017 1926   KETONESUR NEGATIVE 06/25/2017 1926   PROTEINUR NEGATIVE 06/25/2017 1926   NITRITE POSITIVE (A) 06/25/2017 1926   LEUKOCYTESUR NEGATIVE 06/25/2017 1926       RADIOLOGY: Dg Chest 2 View  Result Date: 07/18/2017 CLINICAL DATA:  Chest pain today EXAM: CHEST  2 VIEW COMPARISON:  06/25/2017 FINDINGS: Borderline cardiomegaly. Normal vascularity. Lungs under aerated and grossly clear. No pneumothorax or pleural effusion. Postop changes from aortic valve repair. IMPRESSION: No active cardiopulmonary disease. Electronically Signed   By: Arthur  Hoss M.D.   On: 07/18/2017 21:25    EKG: Orders placed or performed during the hospital encounter of 07/18/17  . ED EKG within 10 minutes  . ED EKG within 10 minutes  . ED EKG  . ED EKG    IMPRESSION AND PLAN:  1.  Non-ST elevation MI.  Troponin level is elevated at 0.5.  Patient is started on heparin drip.  2.  Chest pain, although it sounds  GI related,  will rule out acute coronary syndrome.  We will continue PPI, to help with acid reflux.  3.  Coronary artery disease status post CABG in the past and recent stent placement.  Continue medical treatment as per cardiology. 4.  Aortic stenosis status post remote AVR. 5.  Diabetes type 2 stable, will restart home medications. Continue to monitor blood sugars before meals and at bedtime.  All the records are reviewed and case discussed with ED provider. Management plans discussed with the patient, family and they are in agreement.  CODE STATUS:    Code Status Orders  (From admission, onward)        Start     Ordered   07/19/17 0203  Full code  Continuous     07/19/17 0203    Code Status History    Date Active Date Inactive Code Status Order ID Comments User Context   06/14/2017 12:45 06/16/2017 17:07 Full Code [id number]  Pyreddy, Pavan, MD Inpatient   05/31/2017 10:53 06/01/2017 16:03 Full Code [id number]  Konidena, Snehalatha, MD ED   05/07/2017 02:49 05/09/2017 18:58 Full Code [id number]  Willis, David, MD Inpatient   04/24/2017 00:35 04/26/2017 18:44 Full Code [id number]  Willis, David, MD Inpatient   03/31/2017 20:25 04/03/2017 18:53 Full Code [id number]  Qureshi, Waqas T, MD Inpatient   03/13/2017 01:41 03/14/2017 15:32 Full Code [id number]  Willis, David, MD Inpatient   02/24/2017 04:20 02/27/2017 14:10 Full Code [id number]  Diamond, Michael S, MD Inpatient       TOTAL TIME TAKING CARE OF THIS PATIENT: 45 minutes.    Angela Maier M.D on 07/19/2017 at 2:44 AM  Between 7am to 6pm - Pager - [phone]  After 6pm go to [email or web address] - password [email or web address]  [facility]  Office  [phone]  CC: Primary care physician; Kennedy, Lauren Renee, NP     

## 2017-07-19 NOTE — Plan of Care (Signed)
Chest pain free since arrival.  Sleeping after med for headache given.  Afib/flutter noted on telemetry.

## 2017-07-19 NOTE — Progress Notes (Signed)
Inpatient Diabetes Program Recommendations  AACE/ADA: New Consensus Statement on Inpatient Glycemic Control (2015)  Target Ranges:  Prepandial:   less than 140 mg/dL      Peak postprandial:   less than 180 mg/dL (1-2 hours)      Critically ill patients:  140 - 180 mg/dL   Results for Christopher Cummings, Christopher Cummings. (MRN [id number]) as of 07/19/2017 08:33  Ref. Range 07/19/2017 02:08 07/19/2017 08:17  Glucose-Capillary Latest Ref Range: 65 - 99 mg/dL 409116 (H) 97   Review of Glycemic Control  Diabetes history: DM2 Outpatient Diabetes medications: Jardiance 25 mg daily, Metformin XR 500 mg QAM Current orders for Inpatient glycemic control: Novolog 0-9 units TID with meals, Metformin XR 500 mg QAM, Invokana 100 mg QAM  Inpatient Diabetes Program Recommendations: Correction (SSI): Please consider ordering Novolog 0-5 units QHS for bedtime correction scale. Oral Agents: While inpatient, please discontinue oral DM medications (Invokana and Metfomrin XR). HgbA1C: Please consider order an A1C to evaluate glycemic control over the past 2-3 months.  Thanks, Christopher PennerMarie Donja Tipping, RN, MSN, CDE Diabetes Coordinator Inpatient Diabetes Program [phone] (Team Pager from 8am to 5pm)

## 2017-07-19 NOTE — Progress Notes (Signed)
ANTICOAGULATION CONSULT NOTE - Initial Consult  Pharmacy Consult for heparin Indication: chest pain/ACS  Allergies  Allergen Reactions  . Metformin And Related Other (See Comments)    Only the regular Metformin (diarrhea)    Patient Measurements: Height: 5\' 9"  (175.3 cm) Weight: 205 lb 12.8 oz (93.4 kg) IBW/kg (Calculated) : 70.7 Heparin Dosing Weight: 90 kg  Vital Signs: Temp: 97.7 F (36.5 C) (01/17 0200) Temp Source: Oral (01/17 0200) BP: 124/69 (01/17 0200) Pulse Rate: 69 (01/17 0200)  Labs: Recent Labs    07/18/17 0014 07/18/17 2104 07/19/17 0014  HGB  --  14.8  --   HCT  --  44.7  --   PLT  --  232  --   APTT 109*  --   --   LABPROT 15.4*  --   --   INR 1.23  --   --   CREATININE  --  0.90  --   TROPONINI  --  0.05* 0.06*    Estimated Creatinine Clearance: 93.6 mL/min (by C-G formula based on SCr of 0.9 mg/dL).   Medical History: Past Medical History:  Diagnosis Date  . Aortic stenosis    a. 07/2015 s/p bioprosthetic AVR (#23 [employer]) - [place]; b. 04/2017 Echo: nl fxn'ing AoV.  Marland Kitchen. CAD (coronary artery disease)    a. 2009 PCI->D1; b. 07/2015 CABG x 2 reported (LIMA-LAD noted on cath 01/2017) [facility] [place]; c. 2017/2018 Prox/Dist LCX & D1 stenting; d. 01/2017 PCI: ISR prox/dist LCX stents (PTCA), ISR D1 (med Rx); e. 04/2017 PCI: CBA for ISR- LCX 90p, 70d, D1 95; f. 05/2017 PCI: D1 99 ISR, LCX 70p (2.25x22 Onyx DES); g. 06/2017 Cath: Patent LCX and RCA stents. D1 100 ISR->Med Rx.  . Chronic systolic CHF (congestive heart failure) ([facility])    a. 04/2017 Echo: EF 35-40% with nl functioning bioprosthetic AoV, mild MR, nl PA.  . Colitis   . Diabetes mellitus with complication ([facility])   . Diverticulitis   . Essential hypertension   . Hyperlipidemia   . Ischemic cardiomyopathy    a. 04/2017 Echo: EF 35-40%.  . Rectal bleeding    a. 03/2017 -> f/u @ [facility].    Medications:  Scheduled:  . aspirin EC  81 mg Oral Daily  . atorvastatin  80 mg Oral QHS   . canagliflozin  100 mg Oral QAC breakfast  . clopidogrel  75 mg Oral Daily  . digoxin  0.0625 mg Oral Daily  . docusate sodium  100 mg Oral BID  . heparin  4,000 Units Intravenous Once  . insulin aspart  0-9 Units Subcutaneous TID WC  . isosorbide mononitrate  60 mg Oral Daily  . metFORMIN  500 mg Oral Q breakfast  . metoprolol succinate  50 mg Oral Daily  . pantoprazole  40 mg Oral Daily  . ranolazine  1,000 mg Oral BID  . sodium chloride flush  3 mL Intravenous Q12H  . spironolactone  12.5 mg Oral Daily    Assessment: Patient admitted for CP w/ trops 0.05 >> 0.06, EKG WNL Patient does not appear to be on any anticoagulants except for DAPT therapy (ASA, plavix) Patient is being started on heparin drip for NSTEMI  Goal of Therapy:  Heparin level 0.3-0.7 units/ml Monitor platelets by anticoagulation protocol: Yes   Plan:  Give 4000 units bolus x 1  Will start heparin drip @ 1100 units/hr  Will check a HL @ 0400  Baseline labs drawn -- aPTT drawn  after heparin had been running Will monitor and adjust based on HL's and will monitor daily CBC's  Thomasene Ripple, PharmD, BCPS Clinical Pharmacist 07/19/2017

## 2017-07-19 NOTE — Consult Note (Signed)
Cardiology Consult    Patient ID: Christopher Else Sr. MRN: [id number], DOB/AGE: 05-Mar-1951   Admit date: 07/18/2017 Date of Consult: 07/19/2017  Primary Physician: Galen Manila, NP Primary Cardiologist: Lorine Bears, MD Requesting Provider: Henreitta Leber, MD  Patient Profile    Christopher Else Sr. is a 67 y.o. male with a history of CAD s/p prior 2 vessel CABG w/ subsequent stenting and multiple PCI's over the past year, aortic stenosis status post bioprosthetic AVR, hypertension, hyperlipidemia, ischemic cardiomyopathy with an EF of 35-40%, HFrEF, diabetes, and history of rectal bleeding, who is being seen today for the evaluation of recurrent chest pain at the request of Dr. Donah Driver.  Past Medical History   Past Medical History:  Diagnosis Date  . Aortic stenosis    a. 07/2015 s/p bioprosthetic AVR (#23 [employer]) - [place]; b. 04/2017 Echo: nl fxn'ing AoV.  Marland Kitchen CAD (coronary artery disease)    a. 2009 PCI->D1; b. 07/2015 CABG x 2 reported (LIMA-LAD noted on cath 01/2017) [facility] [place]; c. 2017/2018 Prox/Dist LCX & D1 stenting; d. 01/2017 PCI: ISR prox/dist LCX stents (PTCA), ISR D1 (med Rx); e. 04/2017 PCI: CBA for ISR- LCX 90p, 70d, D1 95; f. 05/2017 PCI: D1 99 ISR, LCX 70p (2.25x22 Onyx DES); g. 06/2017 Cath: Patent LCX and RCA stents. D1 100 ISR->Med Rx.  . Chronic systolic CHF (congestive heart failure) (HCC)    a. 04/2017 Echo: EF 35-40% with nl functioning bioprosthetic AoV, mild MR, nl PA.  . Colitis   . Diabetes mellitus with complication (HCC)   . Diverticulitis   . Essential hypertension   . Hyperlipidemia   . Ischemic cardiomyopathy    a. 04/2017 Echo: EF 35-40%.  . Permanent atrial fibrillation (HCC)    a. CHA2DS2VASc = 4-->No OAC 2/2 ongoing ASA/Plavix and h/o rectal bleeding.  . Rectal bleeding    a. 03/2017 -> f/u @ [facility].    Past Surgical History:  Procedure Laterality Date  . AORTIC VALVE REPLACEMENT    . CORONARY ARTERY BYPASS GRAFT    .  CORONARY BALLOON ANGIOPLASTY N/A 04/02/2017   Procedure: CORONARY BALLOON ANGIOPLASTY;  Surgeon: Lennette Bihari, MD;  Location: [facility] INVASIVE CV LAB;  Service: Cardiovascular;  Laterality: N/A;  . CORONARY STENT INTERVENTION N/A 02/26/2017   Procedure: CORONARY STENT INTERVENTION;  Surgeon: Iran Ouch, MD;  Location: [facility] INVASIVE CV LAB;  Service: Cardiovascular;  Laterality: N/A;  . CORONARY STENT INTERVENTION N/A 05/08/2017   Procedure: CORONARY STENT INTERVENTION;  Surgeon: Yvonne Kendall, MD;  Location: [facility] INVASIVE CV LAB;  Service: Cardiovascular;  Laterality: N/A;  . CORONARY/GRAFT ANGIOGRAPHY N/A 02/26/2017   Procedure: CORONARY/GRAFT ANGIOGRAPHY;  Surgeon: Iran Ouch, MD;  Location: [facility] INVASIVE CV LAB;  Service: Cardiovascular;  Laterality: N/A;  . CORONARY/GRAFT ANGIOGRAPHY N/A 04/02/2017   Procedure: CORONARY/GRAFT ANGIOGRAPHY;  Surgeon: Lennette Bihari, MD;  Location: [facility] INVASIVE CV LAB;  Service: Cardiovascular;  Laterality: N/A;  . CORONARY/GRAFT ANGIOGRAPHY N/A 05/08/2017   Procedure: CORONARY/GRAFT ANGIOGRAPHY;  Surgeon: Yvonne Kendall, MD;  Location: [facility] INVASIVE CV LAB;  Service: Cardiovascular;  Laterality: N/A;  . JOINT REPLACEMENT Left    KNEE  . KNEE ARTHROPLASTY  2000  . LEFT HEART CATH AND CORONARY ANGIOGRAPHY N/A 06/15/2017   Procedure: LEFT HEART CATH AND CORONARY ANGIOGRAPHY;  Surgeon: Iran Ouch, MD;  Location: [facility] INVASIVE CV LAB;  Service: Cardiovascular;  Laterality: N/A;     Allergies  Allergies  Allergen Reactions  . Metformin And  Related Other (See Comments)    Only the regular Metformin (diarrhea)    History of Present Illness    67-year-old male with the above complex past medical history including CAD status post prior stenting of the diagonal in 2009 followed by CABG x2 performed in [place] in January 2017.  Following that, he required stents to the right coronary artery and proximal and distal left circumflex.  Other  history includes aortic stenosis status post bioprosthetic aortic valve replacement at the time of his CABG in January 2017, hypertension, hyperlipidemia, diabetes, and intermittent rectal bleeding.  He has had multiple hospitalizations related to chest pain with finding of in-stent restenosis of the diagonal and proximal and distal left circumflex in August 2018 with subsequent PTCA of the proximal and distal left circumflex.  An initial attempt was made at medical therapy of the diagonal branch however, in October 2018, he required cutting balloon angioplasty within the diagonal stent as well as proximal and distal left circumflex stents, in the setting of recurrent angina.  In late October, he was readmitted with chest pain and underwent stress testing, which was nonischemic.  In November, he was admitted with recurrent chest pain and non-STEMI, and was found again to have severe narrowing/in-stent restenosis in the proximal left circumflex and diagonal.  The proximal left circumflex was treated with a drug-eluting stent and medical therapy was recommended for the diagonal.  He was placed on Ranexa therapy but could not afford it.  He was readmitted in mid December with chest pain and mild troponin elevations (troponin is chronically elevated at a low level) and given recurrent chest pain, decision was made to pursue catheterization.  This time, RCA and circumflex stents were patent while the diagonal stent was completely occluded.  Continue medical therapy was recommended.  He was seen in the office in late December and was doing well on medical therapy.  He says that he continues to do well and has been walking regularly and also participating in cardiac rehabilitation without any significant chest pain, dyspnea, or limitations.  Notably, he was in the emergency department on December 24 with weakness and hematuria.  He was diagnosed with a urinary tract infection and this has since resolved.  On January 16,  he felt weak and washed out throughout the day and stayed in bed until about 3 PM.  When he finally did get out of bed, he had a "bubble like sensation" in the center of his chest.  He says it felt like he had swallowed a pill that got stuck.  He was trying to belch but it did not improve.  That discomfort moved into his shoulders, back, and into his head and ears.  He said it felt like his ears were on fire.  He took Tums and several nitroglycerin without any change in symptoms.  He took a hot shower thinking it might help and symptoms did not change at all.  Due to persistence of symptoms, after about 5-1/2 hours of ongoing symptoms, he called our on-call staff and was advised to present to the emergency department.  Here, ECG was nonacute.  Vital signs are stable.  Troponin was mildly elevated at 0.04 and subsequently rose to 0.06 before coming back down to 0.05.  He continues to have a 4/10 bubblelike sensation in the center of his chest with ongoing back, shoulder, and ear/head pain.  He says this is different than prior anginal symptoms and there has been no assoc dyspnea.  Inpatient Medications    .  aspirin EC  81 mg Oral Daily  . atorvastatin  80 mg Oral QHS  . clopidogrel  75 mg Oral Daily  . digoxin  0.0625 mg Oral Daily  . docusate sodium  100 mg Oral BID  . insulin aspart  0-9 Units Subcutaneous TID WC  . isosorbide mononitrate  60 mg Oral Daily  . metoprolol succinate  50 mg Oral Daily  . pantoprazole  40 mg Oral Daily  . ranolazine  1,000 mg Oral BID  . sodium chloride flush  3 mL Intravenous Q12H  . spironolactone  12.5 mg Oral Daily    Family History    Family History  Problem Relation Age of Onset  . CAD Mother   . Heart failure Mother   . Lupus Mother   . CAD Brother   . Heart attack Brother   . Prostate cancer Father   . Diabetes Sister   . Healthy Paternal Grandmother   . Prostate cancer Paternal Grandfather   . Healthy Brother   . Healthy Sister    indicated  that his mother is deceased. He indicated that his father is deceased. He indicated that both of his sisters are alive. He indicated that only one of his two brothers is alive. He indicated that his maternal grandmother is deceased. He indicated that his maternal grandfather is deceased. He indicated that his paternal grandmother is deceased. He indicated that his paternal grandfather is deceased.   Social History    Social History   Socioeconomic History  . Marital status: Divorced    Spouse name: Not on file  . Number of children: 1  . Years of education: 40  . Highest education level: 12th grade  Social Needs  . Financial resource strain: Not hard at all  . Food insecurity - worry: Never true  . Food insecurity - inability: Never true  . Transportation needs - medical: No  . Transportation needs - non-medical: No  Occupational History  . Not on file  Tobacco Use  . Smoking status: Never Smoker  . Smokeless tobacco: Never Used  Substance and Sexual Activity  . Alcohol use: No    Comment: No Alcohol 12 years, but drank 2 "bottles of liquor" each week for several years prior to quitting  . Drug use: No  . Sexual activity: Not Currently  Other Topics Concern  . Not on file  Social History Narrative  . Not on file     Review of Systems    General:  No chills, fever, night sweats or weight changes.  Cardiovascular:   +++ chest pain, shoulder, back pain w/ headache/earache. No dyspnea on exertion, edema, orthopnea, palpitations, paroxysmal nocturnal dyspnea. Dermatological: No rash, lesions/masses Respiratory: No cough, dyspnea Urologic: No hematuria, dysuria Abdominal:   No nausea, vomiting, diarrhea, bright red blood per rectum, melena, or hematemesis Neurologic:  No visual changes, wkns, changes in mental status. All other systems reviewed and are otherwise negative except as noted above.  Physical Exam    Blood pressure (!) 110/57, pulse 74, temperature 98.7 F (37.1  C), resp. rate 16, height 5\' 9"  (1.753 m), weight 205 lb 12.8 oz (93.4 kg), SpO2 94 %.  General: Pleasant, NAD Psych: Normal affect. Neuro: Alert and oriented X 3. Moves all extremities spontaneously. HEENT: Normal  Neck: Supple without JVD. L carotid bruit vs radiated murmur. Lungs:  Resp regular and unlabored, CTA. Heart: RRR, 2/6 syst murmur @ USB, 3/6 @ LLSB  apex. No s3, s4, or murmurs. Abdomen:  Soft, non-tender, non-distended, BS + x 4.  Extremities: No clubbing, cyanosis or edema. DP/PT/Radials 2+ and equal bilaterally.  Labs     Recent Labs    07/18/17 2104 07/19/17 0014 07/19/17 0635  TROPONINI 0.05* 0.06* 0.04*   Lab Results  Component Value Date   WBC 8.5 07/19/2017   HGB 13.6 07/19/2017   HCT 40.5 07/19/2017   MCV 82.7 07/19/2017   PLT 201 07/19/2017    Recent Labs  Lab 07/19/17 0635  NA 134*  K 3.6  CL 100*  CO2 24  BUN 18  CREATININE 0.88  CALCIUM 9.2  GLUCOSE 105*   Lab Results  Component Value Date   CHOL 80 04/25/2017   HDL 29 (L) 04/25/2017   LDLCALC 36 04/25/2017   TRIG 77 04/25/2017    Radiology Studies    Dg Chest 2 View  Result Date: 07/18/2017 CLINICAL DATA:  Chest pain today EXAM: CHEST  2 VIEW COMPARISON:  06/25/2017 FINDINGS: Borderline cardiomegaly. Normal vascularity. Lungs under aerated and grossly clear. No pneumothorax or pleural effusion. Postop changes from aortic valve repair. IMPRESSION: No active cardiopulmonary disease. Electronically Signed   By: Jolaine Click M.D.   On: 07/18/2017 21:25   Dg Chest 2 View  Result Date: 06/25/2017 CLINICAL DATA:  Weakness. EXAM: CHEST  2 VIEW COMPARISON:  06/14/2017 FINDINGS: Stable mild cardiac enlargement following prior CABG and aortic valve replacement. Stable mild scarring in the left lower lung. There is no evidence of pulmonary edema, consolidation, pneumothorax, nodule or pleural fluid. The bony thorax is unremarkable. IMPRESSION: Stable mild cardiomegaly. Stable left lower lung  scarring. No acute findings. Electronically Signed   By: Irish Lack M.D.   On: 06/25/2017 17:08    ECG & Cardiac Imaging    Afib, 100, inc RBBB, non-specific T changes.  Assessment & Plan    1.  Midsternal chest pain/coronary artery disease/elevated troponin: Patient with complex history of coronary artery disease status post prior two-vessel CABG with multiple interventions, most recently involving the proximal and distal left circumflex as well as the diagonal.  On his most recent catheterization in mid December, diagonal was completely occluded while the circumflex and right coronary artery stents were widely patent.  LIMA to the LAD also widely patent.  Medical therapy recommended.  He had been doing well on aspirin, Plavix, beta-blocker, Ranexa, and long-acting nitrate therapy and has been participating in cardiac rehabilitation without symptoms or limitations.  He had recurrent chest discomfort which she describes as being different from prior angina, starting on the afternoon of January 16 and remaining ever since.  He continues to have 4/10 bubblelike sensation in the center of his chest with pain in his shoulders, headache, and earache.  Troponin is mildly elevated with a peak of 0.06.  He does have chronic troponin elevation typically runs 0.03-0.05.  Symptoms are atypical.  Given recent catheterization data and unimpressive ECG and flat troponin trend, pursue ongoing medical therapy.  Try GI cocktail and otherwise continue current cardiac medications.  Follow-up troponin.  Ambulate.  Possible discharge in the next 24 hours.  2.  HFrEF/ischemic cardiomyopathy: Euvolemic on exam.  Beta-blocker and Spironolactone.  Previously on lisinopril but this was discontinued at his discharge on December 15 in the setting of low blood pressure during catheterization.  Blood pressure somewhat soft today at 110.  We will continue to hold off on ACE inhibitor/ARB/ARNI.  3.  Chronic atrial fibrillation:  Rate controlled on beta-blocker.  Patient has a CHA2DS2VASc of 4.  He is not on oral anticoagulation in the setting of ongoing ASA/Plavix Rx (last PCI/DES to LCX in 05/2017) and rectal bleeding in Aug/Sept 2018 ([facility]  In the long run, he would benefit from addition of OAC and probable discontinuation of ASA given need for P2Y12 inhibitor.  Will defer for now, but must continue to consider, esp w/ no recurrent rectal bleeding and stable H/H.  4.  Essential HTN: stable on  blocker, nitrate, spiro.  5.  HL:  LDL 36 in 04/2017.  6.  Type II DM:  A1c 9.3 in Nov - per IM.  7.  Ao Stenosis:  S/p bioprosthetic AVR in 07/2015.  Nl fxn by echo in 04/2017.  8.  H/o rectal bleeding:  No recurrence. H/H stable.  See discussion above.  Signed, Nicolasa Duckinghristopher Esmae Donathan, NP 07/19/2017, 12:42 PM  For questions or updates, please contact   Please consult [email or web address] for contact info under Cardiology/STEMI.

## 2017-07-19 NOTE — Progress Notes (Signed)
ANTICOAGULATION CONSULT NOTE - Initial Consult  Pharmacy Consult for heparin Indication: chest pain/ACS  Allergies  Allergen Reactions  . Metformin And Related Other (See Comments)    Only the regular Metformin (diarrhea)    Patient Measurements: Height: 5\' 9"  (175.3 cm) Weight: 205 lb 12.8 oz (93.4 kg) IBW/kg (Calculated) : 70.7 Heparin Dosing Weight: 90 kg  Vital Signs: Temp: 98.7 F (37.1 C) (01/17 0819) Temp Source: Oral (01/17 0354) BP: 110/57 (01/17 0819) Pulse Rate: 74 (01/17 0819)  Labs: Recent Labs    07/18/17 0014  07/18/17 2104 07/19/17 0014 07/19/17 0635 07/19/17 1220  HGB  --   --  14.8  --  13.6  --   HCT  --   --  44.7  --  40.5  --   PLT  --   --  232  --  201  --   APTT 109*  --   --   --   --   --   LABPROT 15.4*  --   --   --   --   --   INR 1.23  --   --   --   --   --   HEPARINUNFRC  --   --   --   --  0.51 0.44  CREATININE  --   --  0.90  --  0.88  --   TROPONINI  --    < > 0.05* 0.06* 0.04* 0.03*   < > = values in this interval not displayed.    Estimated Creatinine Clearance: 95.7 mL/min (by C-G formula based on SCr of 0.88 mg/dL).   Medical History: Past Medical History:  Diagnosis Date  . Aortic stenosis    a. 07/2015 s/p bioprosthetic AVR (#23 [employer]) - [place]; b. 04/2017 Echo: nl fxn'ing AoV.  Marland Kitchen. CAD (coronary artery disease)    a. 2009 PCI->D1; b. 07/2015 CABG x 2 reported (LIMA-LAD noted on cath 01/2017) [facility] [place]; c. 2017/2018 Prox/Dist LCX & D1 stenting; d. 01/2017 PCI: ISR prox/dist LCX stents (PTCA), ISR D1 (med Rx); e. 04/2017 PCI: CBA for ISR- LCX 90p, 70d, D1 95; f. 05/2017 PCI: D1 99 ISR, LCX 70p (2.25x22 Onyx DES); g. 06/2017 Cath: Patent LCX and RCA stents. D1 100 ISR->Med Rx.  . Chronic systolic CHF (congestive heart failure) (HCC)    a. 04/2017 Echo: EF 35-40% with nl functioning bioprosthetic AoV, mild MR, nl PA.  . Colitis   . Diabetes mellitus with complication (HCC)   . Diverticulitis   .  Essential hypertension   . Hyperlipidemia   . Ischemic cardiomyopathy    a. 04/2017 Echo: EF 35-40%.  . Permanent atrial fibrillation (HCC)    a. CHA2DS2VASc = 4-->No OAC 2/2 ongoing ASA/Plavix and h/o rectal bleeding.  . Rectal bleeding    a. 03/2017 -> f/u @ [facility].    Medications:  Scheduled:  . aspirin EC  81 mg Oral Daily  . atorvastatin  80 mg Oral QHS  . clopidogrel  75 mg Oral Daily  . digoxin  0.0625 mg Oral Daily  . docusate sodium  100 mg Oral BID  . insulin aspart  0-9 Units Subcutaneous TID WC  . isosorbide mononitrate  60 mg Oral Daily  . metoprolol succinate  50 mg Oral Daily  . pantoprazole  40 mg Oral Daily  . ranolazine  1,000 mg Oral BID  . sodium chloride flush  3 mL Intravenous Q12H  . spironolactone  12.5 mg  Oral Daily    Assessment: Patient admitted for CP w/ trops 0.05 >> 0.06, EKG WNL Patient does not appear to be on any anticoagulants except for DAPT therapy (ASA, plavix) Patient is being started on heparin drip for NSTEMI  Goal of Therapy:  Heparin level 0.3-0.7 units/ml Monitor platelets by anticoagulation protocol: Yes   Plan:  1/17 @0630  HL therapeutic at 0.51. Will Continue current heparin drip rate of 1100 units/hr and recheck HL in 6 hours.   1/17 Confirmatory Level @ 1220- HL therapeutic at 0.44. Will continue current Heparin drip rate of 1100units/hr and recheck Hl and CBC with daily AM labs.   Gardner Candle, PharmD, BCPS Clinical Pharmacist 07/19/2017 1:26 PM

## 2017-07-19 NOTE — Progress Notes (Signed)
[facility] [place] [facility]   PATIENT NAME: Christopher Cummings    MR#:  [id number]  DATE OF BIRTH:  1951/07/02  SUBJECTIVE:  CHIEF COMPLAINT:   Chief Complaint  Patient presents with  . Chest Pain   -Complicated cardiac history, status post bypass surgery and stents last in November 2018 presenting again with chest pain -Still complains of midsternal, epigastric pain radiating to his upper back and also headache  REVIEW OF SYSTEMS:  Review of Systems  Constitutional: Negative for chills, fever and malaise/fatigue.  HENT: Negative for congestion, ear discharge, hearing loss and nosebleeds.   Eyes: Negative for blurred vision and double vision.  Respiratory: Negative for cough, shortness of breath and wheezing.   Cardiovascular: Positive for chest pain. Negative for palpitations and leg swelling.  Gastrointestinal: Negative for abdominal pain, constipation, diarrhea, nausea and vomiting.  Genitourinary: Negative for dysuria and urgency.  Musculoskeletal: Negative for myalgias.  Neurological: Positive for headaches. Negative for dizziness, sensory change, speech change, focal weakness and seizures.  Psychiatric/Behavioral: Negative for depression.    DRUG ALLERGIES:   Allergies  Allergen Reactions  . Metformin And Related Other (See Comments)    Only the regular Metformin (diarrhea)    VITALS:  Blood pressure (!) 110/57, pulse 74, temperature 98.7 F (37.1 C), resp. rate 16, height 5\' 9"  (1.753 m), weight 93.4 kg (205 lb 12.8 oz), SpO2 94 %.  PHYSICAL EXAMINATION:  Physical Exam  GENERAL:  67 y.o.-year-old patient lying in the bed with no acute distress.  EYES: Pupils equal, round, reactive to light and accommodation. No scleral icterus. Extraocular muscles intact.  HEENT: Head atraumatic, normocephalic. Oropharynx and nasopharynx clear.  NECK:  Supple, no jugular venous distention. No thyroid enlargement, no tenderness.  LUNGS: Normal breath  sounds bilaterally, no wheezing, rales,rhonchi or crepitation. No use of accessory muscles of respiration.  CARDIOVASCULAR: S1, S2 normal. No rubs, or gallops. 2/6 systolic murmur present ABDOMEN: Soft, nontender, nondistended. Bowel sounds present. No organomegaly or mass.  EXTREMITIES: No pedal edema, cyanosis, or clubbing.  NEUROLOGIC: Cranial nerves II through XII are intact. Muscle strength 5/5 in all extremities. Sensation intact. Gait not checked.  PSYCHIATRIC: The patient is alert and oriented x 3.  SKIN: No obvious rash, lesion, or ulcer.    LABORATORY PANEL:   CBC Recent Labs  Lab 07/19/17 0635  WBC 8.5  HGB 13.6  HCT 40.5  PLT 201   ------------------------------------------------------------------------------------------------------------------  Chemistries  Recent Labs  Lab 07/19/17 0635  NA 134*  K 3.6  CL 100*  CO2 24  GLUCOSE 105*  BUN 18  CREATININE 0.88  CALCIUM 9.2   ------------------------------------------------------------------------------------------------------------------  Cardiac Enzymes Recent Labs  Lab 07/19/17 0635  TROPONINI 0.04*   ------------------------------------------------------------------------------------------------------------------  RADIOLOGY:  Dg Chest 2 View  Result Date: 07/18/2017 CLINICAL DATA:  Chest pain today EXAM: CHEST  2 VIEW COMPARISON:  06/25/2017 FINDINGS: Borderline cardiomegaly. Normal vascularity. Lungs under aerated and grossly clear. No pneumothorax or pleural effusion. Postop changes from aortic valve repair. IMPRESSION: No active cardiopulmonary disease. Electronically Signed   By: Jolaine ClickArthur  Hoss M.D.   On: 07/18/2017 21:25    EKG:   Orders placed or performed during the hospital encounter of 07/18/17  . ED EKG within 10 minutes  . ED EKG within 10 minutes  . ED EKG  . ED EKG    ASSESSMENT AND PLAN:   67 y/o M with PMH of HTN, CAD s/p CABG and stents- last in Nov 2018, bio prosthetic  aortic  valve replacement, chronic systolic CHF, persistent afib not on anticoagulation due to h/o GI bleed presents secondary to chest pain .  #1 unstable angina-has complicated cardiac history, status post bypass surgery and stents put in October 2018 and also Nov 2018 - last cath in Dec 2018 and medical mgmt recommended - doing cardiac rehab. -pending cardiology consult. Troponins slightly elevated only but plataeued - on heparin drip and Nitro. 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  #2 chronic systolic CHF-well compensated. Lasix as needed at home  #3 chronic A. fib-on digoxin and Toprol -Not on chronic anticoagulation due to history of life-threatening GI bleed. -Continue aspirin  #4 diabetes mellitus- hold oral meds- metformin and  Jardiance - SSI for now  #5 CAD-stable. Continue cardiac medications - On aspirin, Plavix, statin, Imdur and Toprol  #6 DVT prophylaxis-on heparin drip     All the records are reviewed and case discussed with Care Management/Social Workerr. Management plans discussed with the patient, family and they are in agreement.  CODE STATUS: Full Code  TOTAL TIME TAKING CARE OF THIS PATIENT: 38 minutes.   POSSIBLE D/C IN 1-2 DAYS, DEPENDING ON CLINICAL CONDITION.   Enid Baas M.D on 07/19/2017 at 10:05 AM  Between 7am to 6pm - Pager - [phone]  After 6pm go to [email or web address] - Social research officer, government  [facility]  Office  [phone]  CC: Primary care physician; Galen Manila, NP

## 2017-07-19 NOTE — Progress Notes (Signed)
Nutrition Brief Note  Patient identified on the Malnutrition Screening Tool (MST) Report  67 y/o M with PMH of HTN, CAD s/p CABG and stents- last in Nov 2018, bio prosthetic aortic valve replacement, chronic systolic CHF, persistent afib not on anticoagulation due to h/o GI bleed presents secondary to chest pain.   Wt Readings from Last 15 Encounters:  07/19/17 205 lb 12.8 oz (93.4 kg)  07/04/17 219 lb 6 oz (99.5 kg)  06/29/17 217 lb 12.8 oz (98.8 kg)  06/25/17 219 lb (99.3 kg)  06/21/17 219 lb 8 oz (99.6 kg)  06/16/17 219 lb (99.3 kg)  06/13/17 224 lb 12 oz (101.9 kg)  06/06/17 219 lb (99.3 kg)  06/01/17 218 lb 8 oz (99.1 kg)  05/21/17 224 lb (101.6 kg)  05/21/17 223 lb 6.4 oz (101.3 kg)  05/15/17 228 lb (103.4 kg)  05/11/17 228 lb 6 oz (103.6 kg)  05/09/17 226 lb (102.5 kg)  04/30/17 230 lb 9.6 oz (104.6 kg)    Body mass index is 30.39 kg/m. Patient meets criteria for obesity based on current BMI.   Current diet order is CHO controlled, patient is consuming approximately 100% of meals at this time. Labs and medications reviewed.   No nutrition interventions warranted at this time. If nutrition issues arise, please consult RD.   Betsey Holidayasey Geneveive Furness MS, RD, LDN Pager #- [phone] After Hours Pager: [phone]

## 2017-07-20 ENCOUNTER — Telehealth: Payer: Self-pay | Admitting: Cardiovascular Disease

## 2017-07-20 DIAGNOSIS — K219 Gastro-esophageal reflux disease without esophagitis: Secondary | ICD-10-CM | POA: Diagnosis not present

## 2017-07-20 DIAGNOSIS — R079 Chest pain, unspecified: Secondary | ICD-10-CM | POA: Diagnosis present

## 2017-07-20 DIAGNOSIS — M546 Pain in thoracic spine: Secondary | ICD-10-CM | POA: Diagnosis not present

## 2017-07-20 DIAGNOSIS — I10 Essential (primary) hypertension: Secondary | ICD-10-CM

## 2017-07-20 DIAGNOSIS — E1159 Type 2 diabetes mellitus with other circulatory complications: Secondary | ICD-10-CM

## 2017-07-20 DIAGNOSIS — R0789 Other chest pain: Secondary | ICD-10-CM

## 2017-07-20 DIAGNOSIS — M549 Dorsalgia, unspecified: Secondary | ICD-10-CM

## 2017-07-20 LAB — CBC
HEMATOCRIT: 39.9 % — AB (ref 40.0–52.0)
Hemoglobin: 13.3 g/dL (ref 13.0–18.0)
MCH: 27.3 pg (ref 26.0–34.0)
MCHC: 33.4 g/dL (ref 32.0–36.0)
MCV: 81.8 fL (ref 80.0–100.0)
Platelets: 205 10*3/uL (ref 150–440)
RBC: 4.87 MIL/uL (ref 4.40–5.90)
RDW: 16.2 % — ABNORMAL HIGH (ref 11.5–14.5)
WBC: 8.5 10*3/uL (ref 3.8–10.6)

## 2017-07-20 LAB — GLUCOSE, CAPILLARY
GLUCOSE-CAPILLARY: 183 mg/dL — AB (ref 65–99)
Glucose-Capillary: 121 mg/dL — ABNORMAL HIGH (ref 65–99)

## 2017-07-20 LAB — HEPARIN LEVEL (UNFRACTIONATED): Heparin Unfractionated: 0.61 IU/mL (ref 0.30–0.70)

## 2017-07-20 MED ORDER — CYCLOBENZAPRINE HCL 10 MG PO TABS: 5.0000 mg | ORAL_TABLET | Freq: Three times a day (TID) | ORAL | Status: DC | PRN

## 2017-07-20 MED ORDER — ALUM & MAG HYDROXIDE-SIMETH 200-200-20 MG/5ML PO SUSP: 30.0000 mL | Freq: Four times a day (QID) | ORAL | 0 refills | Status: AC | PRN

## 2017-07-20 MED ORDER — CYCLOBENZAPRINE HCL 5 MG PO TABS: 5.0000 mg | ORAL_TABLET | Freq: Three times a day (TID) | ORAL | 0 refills | Status: AC | PRN

## 2017-07-20 NOTE — Telephone Encounter (Signed)
TCM....  Patient is being discharged   They saw C. Brion AlimentBerge  They are scheduled to see Brion AlimentBerge on 1/29  They were seen for Midsternal chest pain  They need to be seen within 1 week  Pt not added to wait list   Please call

## 2017-07-20 NOTE — Discharge Summary (Signed)
[facility] NAME: Christopher Cummings    MR#:  [id number]  DATE OF BIRTH:  1950/11/04  DATE OF ADMISSION:  07/18/2017   ADMITTING PHYSICIAN: Amelia Jo, MD  DATE OF DISCHARGE: 07/20/2017  1:24 PM  PRIMARY CARE PHYSICIAN: [name], NP   ADMISSION DIAGNOSIS:   First degree heart block [I44.0] NSTEMI (non-ST elevated myocardial infarction) ([place]) [I21.4] Nonintractable headache, unspecified chronicity pattern, unspecified headache type [R51]  DISCHARGE DIAGNOSIS:   Principal Problem:   Chest tightness Active Problems:   HTN (hypertension)   Diabetes ([place])   H/O prosthetic aortic valve replacement   Gastroesophageal reflux disease   Back pain   Chest pain   SECONDARY DIAGNOSIS:   Past Medical History:  Diagnosis Date  . Aortic stenosis    a. 07/2015 s/p bioprosthetic AVR (#23 [employer]) - [place]; b. 04/2017 Echo: nl fxn'ing AoV.  Marland Kitchen CAD (coronary artery disease)    a. 2009 PCI->D1; b. 07/2015 CABG x 2 reported (LIMA-LAD noted on cath 01/2017) [facility] - [place]; c. 2017/2018 Prox/Dist LCX & D1 stenting; d. 01/2017 PCI: ISR prox/dist LCX stents (PTCA), ISR D1 (med Rx); e. 04/2017 PCI: CBA for ISR- LCX 90p, 70d, D1 95; f. 05/2017 PCI: D1 99 ISR, LCX 70p (2.25x22 [employer] DES); g. 06/2017 Cath: Patent LCX and RCA stents. D1 100 ISR->Med Rx.  . Chronic systolic CHF (congestive heart failure) ([place])    a. 04/2017 Echo: EF 35-40% with nl functioning bioprosthetic AoV, mild MR, nl PA.  . Colitis   . Diabetes mellitus with complication ([place])   . Diverticulitis   . Essential hypertension   . Hyperlipidemia   . Ischemic cardiomyopathy    a. 04/2017 Echo: EF 35-40%.  . Permanent atrial fibrillation ([place])    a. CHA2DS2VASc = 4-->No OAC 2/2 ongoing ASA/Plavix and h/o rectal bleeding.  . Rectal bleeding    a. 03/2017 -> f/u @ [facility].    HOSPITAL COURSE:   67 y/o M with PMH of HTN, CAD s/p CABG and stents- last in Nov  2018, bio prosthetic aortic valve replacement, chronic systolic CHF, persistent afib not on anticoagulation due to h/o GI bleed presents secondary to chest pain.  #1 chest pain- secondary to GI causes and also musculoskeletal -has complicated cardiac history, status post bypass surgery and stents put in October 2018 and alsoNov2018 - last cath in Dec 2018 and medical mgmt recommended - doing cardiac rehab. -Troponins slightly elevated only but plataeued -Appreciate cardiology consult. Initially admitted and was started on heparin drip. However ruled out as troponins plateaued and his symptoms improved with GI cocktail. 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  #2 chronic systolic CHF-well compensated. Lasix as needed at home  #3 chronic A. fib-on digoxin and Toprol -Not on chronic anticoagulation due to history of life-threatening GI bleed. -Continue aspirin  #4 diabetes mellitus-  restarted home meds- metformin and Jardiance  #5 CAD-stable. Continue cardiac medications - On aspirin, Plavix, statin, Imdur and Toprol  Ambulating well prior to discharge. Will be discharged today    DISCHARGE CONDITIONS:   Guarded  CONSULTS OBTAINED:   Treatment Team:  Minna Merritts, MD  DRUG ALLERGIES:   Allergies  Allergen Reactions  . Metformin And Related Other (See Comments)    Only the regular Metformin (diarrhea)  DISCHARGE MEDICATIONS:   Allergies as of 07/20/2017      Reactions   Metformin And Related Other (See Comments)   Only the regular Metformin (diarrhea)      Medication List    TAKE these medications   acetaminophen 325 MG tablet Commonly known as:  TYLENOL Take 2 tablets (650 mg total) by mouth every 6 (six) hours as needed for mild pain (or Fever >/= 101).   alum & mag hydroxide-simeth 200-200-20 MG/5ML  suspension Commonly known as:  MAALOX/MYLANTA Take 30 mLs by mouth every 6 (six) hours as needed for indigestion or heartburn.   aspirin EC 81 MG tablet Take 1 tablet (81 mg total) by mouth daily.   atorvastatin 80 MG tablet Commonly known as:  LIPITOR Take 1 tablet (80 mg total) by mouth at bedtime.   blood glucose meter kit and supplies Dispense based on patient and insurance preference. Use 1 time daily as directed. (FOR ICD-9 250.00, 250.01).   Blood Pressure Cuff Misc 1 Units by Does not apply route daily.   clopidogrel 75 MG tablet Commonly known as:  PLAVIX Take 1 tablet (75 mg total) daily by mouth.   cyclobenzaprine 5 MG tablet Commonly known as:  FLEXERIL Take 1 tablet (5 mg total) by mouth 3 (three) times daily as needed for muscle spasms.   digoxin 0.125 MG tablet Commonly known as:  LANOXIN Take 0.5 tablets (0.0625 mg total) by mouth daily.   docusate sodium 100 MG capsule Commonly known as:  COLACE Take 1 capsule (100 mg total) by mouth 2 (two) times daily.   empagliflozin 25 MG Tabs tablet Commonly known as:  JARDIANCE Take 25 mg daily by mouth.   isosorbide mononitrate 60 MG 24 hr tablet Commonly known as:  IMDUR Take 1 tablet (60 mg total) by mouth daily.   meclizine 25 MG tablet Commonly known as:  ANTIVERT Take 25 mg by mouth 3 (three) times daily as needed for dizziness.   metFORMIN 500 MG 24 hr tablet Commonly known as:  GLUCOPHAGE-XR Take 1 tablet (500 mg total) daily with breakfast by mouth.   metoprolol succinate 50 MG 24 hr tablet Commonly known as:  TOPROL-XL Take one tablet (50 mg) daily if systolic blood pressure is >100   nitroGLYCERIN 0.4 MG SL tablet Commonly known as:  NITROSTAT Place 1 tablet (0.4 mg total) under the tongue every 5 (five) minutes as needed for chest pain.   ondansetron 4 MG disintegrating tablet Commonly known as:  ZOFRAN ODT Take 1 tablet (4 mg total) by mouth every 8 (eight) hours as needed for nausea or  vomiting.   pantoprazole 40 MG tablet Commonly known as:  PROTONIX Take 1 tablet (40 mg total) daily by mouth.   ranolazine 1000 MG SR tablet Commonly known as:  RANEXA Take 1 tablet (1,000 mg total) by mouth 2 (two) times daily.   spironolactone 25 MG tablet Commonly known as:  ALDACTONE Take 0.5 tablets (12.5 mg total) daily by mouth.        DISCHARGE INSTRUCTIONS:   1. Please follow up in 2 weeks 2. Cardiology follow-up in 1-2 weeks  DIET:   Cardiac diet and Diabetic diet  ACTIVITY:   Activity as tolerated  OXYGEN:   Home Oxygen: No.  Oxygen Delivery: room air  DISCHARGE LOCATION:   home   If you experience worsening of your admission symptoms, develop shortness of breath, life threatening emergency, suicidal or homicidal thoughts you must seek medical attention immediately by calling  911 or calling your MD immediately  if symptoms less severe.  You Must read complete instructions/literature along with all the possible adverse reactions/side effects for all the Medicines you take and that have been prescribed to you. Take any new Medicines after you have completely understood and accpet all the possible adverse reactions/side effects.   Please note  You were cared for by a hospitalist during your hospital stay. If you have any questions about your discharge medications or the care you received while you were in the hospital after you are discharged, you can call the unit and asked to speak with the hospitalist on call if the hospitalist that took care of you is not available. Once you are discharged, your primary care physician will handle any further medical issues. Please note that NO REFILLS for any discharge medications will be authorized once you are discharged, as it is imperative that you return to your primary care physician (or establish a relationship with a primary care physician if you do not have one) for your aftercare needs so that they can reassess  your need for medications and monitor your lab values.    On the day of Discharge:  VITAL SIGNS:   Blood pressure 126/67, pulse 65, temperature 97.6 F (36.4 C), temperature source Oral, resp. rate 16, height _0  (1.753 m), weight 96.7 kg (213 lb 3.2 oz), SpO2 95 %.  PHYSICAL EXAMINATION:    GENERAL:  67 y.o.-year-old patient lying in the bed with no acute distress.  EYES: Pupils equal, round, reactive to light and accommodation. No scleral icterus. Extraocular muscles intact.  HEENT: Head atraumatic, normocephalic. Oropharynx and nasopharynx clear.  NECK:  Supple, no jugular venous distention. No thyroid enlargement, no tenderness.  LUNGS: Normal breath sounds bilaterally, no wheezing, rales,rhonchi or crepitation. No use of accessory muscles of respiration.  CARDIOVASCULAR: S1, S2 normal. No rubs, or gallops. 2/6 systolic murmur present ABDOMEN: Soft, nontender, nondistended. Bowel sounds present. No organomegaly or mass.  EXTREMITIES: No pedal edema, cyanosis, or clubbing.  NEUROLOGIC: Cranial nerves II through XII are intact. Muscle strength 5/5 in all extremities. Sensation intact. Gait not checked.  PSYCHIATRIC: The patient is alert and oriented x 3.  SKIN: No obvious rash, lesion, or ulcer.      DATA REVIEW:   CBC Recent Labs  Lab 07/20/17 0455  WBC 8.5  HGB 13.3  HCT 39.9*  PLT 205    Chemistries  Recent Labs  Lab 07/19/17 0635  NA 134*  K 3.6  CL 100*  CO2 24  GLUCOSE 105*  BUN 18  CREATININE 0.88  CALCIUM 9.2     Microbiology Results  Results for orders placed or performed during the hospital encounter of 06/25/17  Urine Culture     Status: Abnormal   Collection Time: 06/25/17  7:26 PM  Result Value Ref Range Status   Specimen Description   Final    URINE, RANDOM Performed at [facility], [address], [place], [place] [address]    Special Requests   Final    NONE Performed at [facility], [address]  [place], [place] [address]    Culture >=100,000 COLONIES/mL ESCHERICHIA COLI (A)  Final   Report Status 06/28/2017 FINAL  Final   Organism ID, Bacteria ESCHERICHIA COLI (A)  Final      Susceptibility   Escherichia coli - MIC*    AMPICILLIN 8 SENSITIVE Sensitive     CEFAZOLIN <=4 SENSITIVE Sensitive     CEFTRIAXONE <=  1 SENSITIVE Sensitive     CIPROFLOXACIN <=0.25 SENSITIVE Sensitive     GENTAMICIN <=1 SENSITIVE Sensitive     IMIPENEM <=0.25 SENSITIVE Sensitive     NITROFURANTOIN <=16 SENSITIVE Sensitive     TRIMETH/SULFA <=20 SENSITIVE Sensitive     AMPICILLIN/SULBACTAM <=2 SENSITIVE Sensitive     PIP/TAZO <=4 SENSITIVE Sensitive     Extended ESBL NEGATIVE Sensitive     * >=100,000 COLONIES/mL ESCHERICHIA COLI    RADIOLOGY:  No results found.   Management plans discussed with the patient, family and they are in agreement.  CODE STATUS:     Code Status Orders  (From admission, onward)        Start     Ordered   07/19/17 0203  Full code  Continuous     07/19/17 0203    Code Status History    Date Active Date Inactive Code Status Order ID Comments User Context   06/14/2017 12:45 06/16/2017 17:07 Full Code [id number]  Saundra Shelling, MD Inpatient   05/31/2017 10:53 06/01/2017 16:03 Full Code [id number]  Epifanio Lesches, MD ED   05/07/2017 02:49 05/09/2017 18:58 Full Code [id number]  Lance Coon, MD Inpatient   04/24/2017 00:35 04/26/2017 18:44 Full Code [id number]  Lance Coon, MD Inpatient   03/31/2017 20:25 04/03/2017 18:53 Full Code [id number]  Flossie Dibble, MD Inpatient   03/13/2017 01:41 03/14/2017 15:32 Full Code [id number]  Lance Coon, MD Inpatient   02/24/2017 04:20 02/27/2017 14:10 Full Code [id number]  Harrie Foreman, MD Inpatient      TOTAL TIME TAKING CARE OF THIS PATIENT: 38 minutes.    Gladstone Lighter M.D on 07/20/2017 at 1:54 PM  Between 7am to 6pm - Pager - [phone]  After 6pm go to [email or web address] - password [email or web address] [facility]  [employer]  [facility]  Office  [phone]  CC: Primary care physician; [name], NP   Note: This dictation was prepared with Dragon dictation along with smaller phrase technology. Any transcriptional errors that result from this process are unintentional.

## 2017-07-20 NOTE — Progress Notes (Signed)
Progress Note  Patient Name: Christopher Mohler Sr. Date of Encounter: 07/20/2017  Primary Cardiologist: Lorine Bears, MD  Subjective   Chest pain ("bubble in chest") resolved immediately after receiving GI cocktail yesterday afternoon.  He still has discomfort and soreness in his bilateral trapezius that extends into his neck and occipital area.  Soreness is worse with palpation but does not change with flexion/extension/rotation of his head/neck or movement/use of his arms/shoulders.  Inpatient Medications    Scheduled Meds: . aspirin EC  81 mg Oral Daily  . atorvastatin  80 mg Oral QHS  . clopidogrel  75 mg Oral Daily  . digoxin  0.0625 mg Oral Daily  . docusate sodium  100 mg Oral BID  . insulin aspart  0-9 Units Subcutaneous TID WC  . isosorbide mononitrate  60 mg Oral Daily  . metoprolol succinate  50 mg Oral Daily  . pantoprazole  40 mg Oral Daily  . ranolazine  1,000 mg Oral BID  . sodium chloride flush  3 mL Intravenous Q12H  . spironolactone  12.5 mg Oral Daily   Continuous Infusions:  PRN Meds: acetaminophen, bisacodyl, HYDROcodone-acetaminophen, meclizine, nitroGLYCERIN, nitroGLYCERIN, ondansetron **OR** ondansetron (ZOFRAN) IV, sodium chloride flush   Vital Signs    Vitals:   07/19/17 0819 07/19/17 1600 07/19/17 2031 07/20/17 0346  BP: (!) 110/57 111/64 113/65 124/84  Pulse: 74 89 73 75  Resp: 16 20 18 18   Temp: 98.7 F (37.1 C) 97.7 F (36.5 C) 97.8 F (36.6 C) 97.6 F (36.4 C)  TempSrc:  Oral Oral Oral  SpO2: 94% 96% 96% 99%  Weight:    213 lb 3.2 oz (96.7 kg)  Height:        Intake/Output Summary (Last 24 hours) at 07/20/2017 0745 Last data filed at 07/20/2017 0348 Gross per 24 hour  Intake 600 ml  Output 550 ml  Net 50 ml   Filed Weights   07/18/17 2103 07/19/17 0200 07/20/17 0346  Weight: 219 lb (99.3 kg) 205 lb 12.8 oz (93.4 kg) 213 lb 3.2 oz (96.7 kg)    Physical Exam   GEN: Well nourished, well developed, in no acute distress.    HEENT: Grossly normal.  Neck: Supple, no JVD, + L carotid bruit, no masses. Cardiac: RRR, 2/6 syst murmur @ USB, 3/6 @ LLSB  apex. No rubs, or gallops. No clubbing, cyanosis, edema.  Radials/DP/PT 2+ and equal bilaterally.  Respiratory:  Respirations regular and unlabored, clear to auscultation bilaterally. GI: Soft, nontender, nondistended, BS + x 4. MS: no deformity or atrophy. Skin: warm and dry, no rash. Neuro:  Strength and sensation are intact. Psych: AAOx3.  Normal affect.  Labs    Chemistry Recent Labs  Lab 07/18/17 2104 07/19/17 0635  NA 137 134*  K 3.9 3.6  CL 100* 100*  CO2 26 24  GLUCOSE 175* 105*  BUN 17 18  CREATININE 0.90 0.88  CALCIUM 9.6 9.2  GFRNONAA >60 >60  GFRAA >60 >60  ANIONGAP 11 10     Hematology Recent Labs  Lab 07/18/17 2104 07/19/17 0635 07/20/17 0455  WBC 11.1* 8.5 8.5  RBC 5.45 4.90 4.87  HGB 14.8 13.6 13.3  HCT 44.7 40.5 39.9*  MCV 82.2 82.7 81.8  MCH 27.1 27.7 27.3  MCHC 33.0 33.5 33.4  RDW 16.5* 16.4* 16.2*  PLT 232 201 205    Cardiac Enzymes Recent Labs  Lab 07/18/17 2104 07/19/17 0014 07/19/17 0635 07/19/17 1220  TROPONINI 0.05* 0.06* 0.04* 0.03*  Radiology    Dg Chest 2 View  Result Date: 07/18/2017 CLINICAL DATA:  Chest pain today EXAM: CHEST  2 VIEW COMPARISON:  06/25/2017 FINDINGS: Borderline cardiomegaly. Normal vascularity. Lungs under aerated and grossly clear. No pneumothorax or pleural effusion. Postop changes from aortic valve repair. IMPRESSION: No active cardiopulmonary disease. Electronically Signed   By: Jolaine Click M.D.   On: 07/18/2017 21:25    Telemetry    Afib, 80's - Personally Reviewed  Patient Profile     67 y.o. male with a history of CAD s/p prior 2 vessel CABG w/ subsequent stenting and multiple PCI's over the past year, aortic stenosis status post bioprosthetic AVR, hypertension, hyperlipidemia, ischemic cardiomyopathy with an EF of 35-40%, HFrEF, diabetes, and history of rectal  bleeding who was admitted 1/16 with recurrent chest pain and back/neck/head discomfort.  Assessment & Plan    1.  Midsternal chest pain/CAD/Elevated troponin:  Pt w/ complex h/o CAD s/p prior 2 vessel CABG w/ multiple interventions, most recently involving the prox/dist LCX and D1.  Cath 06/2017 revealed stable LCX/RCA anatomy w/ occluded Diag. LIMA  LAD patent.  He has been medically managed since and did well but developed weakness associated with a 'bubble like' sensation in the center of his chest on 1/16. This was assoc with upper back, trapezius, neck, and head pain.  Trop mildly elevated on arrival @ 0.05.  Trend has been flat.  He has a h/o chronic, mild trop elevation.  Chest discomfort resolved on 1/17 after receiving GI cocktail (after persisting for nearly 24 hrs).  Trapezius/neck discomfort persists and is worsened with palpation.  Presentation is atypical and does not represent ACS.  No further cardiac eval planned.  Cont current cardiac meds including asa, statin, plavix, nitrate,  blocker, and ranexa.  Ambulate.  Ok for d/c from our perspective.  2.  Upper back/Trapezius/Neck pain/tenderness:  Area is sore and tender to palpation.  No change with position changes.  Pain improved significantly with vicodin overnight.  Will add flexeril.  Could consider tramadol for breakthrough pain if flexeril ineffective.  3.  HFrEF/ICM:  Euvolemic.  Cont  blocker, spiro, digoxin.  Prev on acei but d/c'd 12/15 in setting of soft BPs during cath.  Pressure trending soft - 1-teens.  Look to resume acei as outpt if bp stable. Don't think BP would tolerate entresto.  4.  Essential HTN:  Stable on  blocker, nitrate, spiro.  5.  HL:  LDL 36 in 04/2017.  6.  Type II DM:  A1c 9.3 in Nov.  Per IM.  7.  Ao Stenosis:  S/p bioprosthetic AVR in 07/2015.  Nl fxn by echo 04/2017.  8.  Chronic Afib: rate controlled on  blocker.  CHA2DS2VASc = 4.  Not currently on [facility] in the setting of ongoing ASA/Plavix (last  DES to LCX 05/2017) and rectal bleeding in Aug/Set 2018 (seen by GI @ [facility]).  H/H has been stable.  Will need to readdress as outpt - ? Add DOAC, cont plavix, d/c ASA.  9.  Carotid Arterial Dzs:  Bruit on left.  Says he had an u/s in [place].  No results available in Care Everywhere but it looks like he had a CTA head and MRI head 08/2016.  Will need f/u u/s as outpt.  Cont asa, statin.  10.  H/o Rectal Bleeding:  No recurrence.  H/H stable.  Signed, Nicolasa Ducking, NP  07/20/2017, 7:45 AM    For questions or updates, please contact   Please  consult [email or web address] for contact info under Cardiology/STEMI.   Attending Note Patient seen and examined, agree with detailed note above,  Patient presentation and plan discussed on rounds.   Chest discomfort resolved after GI cocktail yesterday Overnight and this morning with discomfort in his upper thoracic spine radiating into posterior shoulders neck Took 2 Vicodin reports that he slept well and feels somewhat better this morning No further "bubble in his chest" Getting ready to ambulate around the hallways  On physical exam no JVP, lungs clear to auscultation bilaterally heart sounds regular with normal S1-S2, with no carotid bruits appreciated, abdomen soft nontender no significant lower extremity edema A/P Atypical chest pain Possibly GI related symptoms improved after GI cocktail  cardiac enzymes non-trending, EKG essentially unchanged Different from previous anginal symptoms No further Ischemic workup needed at this time  Ischemic cardiomyopathy  euvolemic Continue beta blocker, Aldactone  Chronic atrial fibrillation flutter Rate well controlled  on aspirin Plavix Dilemma concerning risk and benefit of anticoagulation discussed with him in detail history of GI bleeding August and [facility] As an outpatient we need to readdress restarting his anticoagulation For now aspirin and Plavix given recent stent  placement November 2018  Greater than 50% was spent in counseling and coordination of care with patient Total encounter time 25 minutes or more   Signed: Dossie Arbourim Yoav Okane  M.D., Ph.D. [facility]

## 2017-07-20 NOTE — Telephone Encounter (Signed)
[facility].  Patient understands to follow up with provider Ward Givenshris Berge NP on 07/31/17 at 11:30 [facility]. Patient understands discharge instructions? Yes Patient understands medications and regiment? Yes Patient understands to bring all medications to this visit? Yes  Spoke with patient and he denied any questions at this time. Confirmed appointment with no further concerns.

## 2017-07-20 NOTE — Progress Notes (Signed)
ANTICOAGULATION CONSULT NOTE - Initial Consult  Pharmacy Consult for heparin Indication: chest pain/ACS  Allergies  Allergen Reactions  . Metformin And Related Other (See Comments)    Only the regular Metformin (diarrhea)    Patient Measurements: Height: 5\' 9"  (175.3 cm) Weight: 213 lb 3.2 oz (96.7 kg) IBW/kg (Calculated) : 70.7 Heparin Dosing Weight: 90 kg  Vital Signs: Temp: 97.6 F (36.4 C) (01/18 0346) Temp Source: Oral (01/18 0346) BP: 124/84 (01/18 0346) Pulse Rate: 75 (01/18 0346)  Labs: Recent Labs    07/18/17 0014  07/18/17 2104 07/19/17 0014 07/19/17 0635 07/19/17 1220 07/20/17 0455  HGB  --    < > 14.8  --  13.6  --  13.3  HCT  --   --  44.7  --  40.5  --  39.9*  PLT  --   --  232  --  201  --  205  APTT 109*  --   --   --   --   --   --   LABPROT 15.4*  --   --   --   --   --   --   INR 1.23  --   --   --   --   --   --   HEPARINUNFRC  --   --   --   --  0.51 0.44 0.61  CREATININE  --   --  0.90  --  0.88  --   --   TROPONINI  --    < > 0.05* 0.06* 0.04* 0.03*  --    < > = values in this interval not displayed.    Estimated Creatinine Clearance: 97.3 mL/min (by C-G formula based on SCr of 0.88 mg/dL).   Medical History: Past Medical History:  Diagnosis Date  . Aortic stenosis    a. 07/2015 s/p bioprosthetic AVR (#23 [employer]) - [place]; b. 04/2017 Echo: nl fxn'ing AoV.  Marland Kitchen. CAD (coronary artery disease)    a. 2009 PCI->D1; b. 07/2015 CABG x 2 reported (LIMA-LAD noted on cath 01/2017) [facility] [place]; c. 2017/2018 Prox/Dist LCX & D1 stenting; d. 01/2017 PCI: ISR prox/dist LCX stents (PTCA), ISR D1 (med Rx); e. 04/2017 PCI: CBA for ISR- LCX 90p, 70d, D1 95; f. 05/2017 PCI: D1 99 ISR, LCX 70p (2.25x22 Onyx DES); g. 06/2017 Cath: Patent LCX and RCA stents. D1 100 ISR->Med Rx.  . Chronic systolic CHF (congestive heart failure) (HCC)    a. 04/2017 Echo: EF 35-40% with nl functioning bioprosthetic AoV, mild MR, nl PA.  . Colitis   . Diabetes  mellitus with complication (HCC)   . Diverticulitis   . Essential hypertension   . Hyperlipidemia   . Ischemic cardiomyopathy    a. 04/2017 Echo: EF 35-40%.  . Permanent atrial fibrillation (HCC)    a. CHA2DS2VASc = 4-->No OAC 2/2 ongoing ASA/Plavix and h/o rectal bleeding.  . Rectal bleeding    a. 03/2017 -> f/u @ [facility].    Medications:  Scheduled:  . aspirin EC  81 mg Oral Daily  . atorvastatin  80 mg Oral QHS  . clopidogrel  75 mg Oral Daily  . digoxin  0.0625 mg Oral Daily  . docusate sodium  100 mg Oral BID  . insulin aspart  0-9 Units Subcutaneous TID WC  . isosorbide mononitrate  60 mg Oral Daily  . metoprolol succinate  50 mg Oral Daily  . pantoprazole  40 mg Oral Daily  .  ranolazine  1,000 mg Oral BID  . sodium chloride flush  3 mL Intravenous Q12H  . spironolactone  12.5 mg Oral Daily    Assessment: Patient admitted for CP w/ trops 0.05 >> 0.06, EKG WNL Patient does not appear to be on any anticoagulants except for DAPT therapy (ASA, plavix) Patient is being started on heparin drip for NSTEMI  Goal of Therapy:  Heparin level 0.3-0.7 units/ml Monitor platelets by anticoagulation protocol: Yes   Plan:  1/17 @0630  HL therapeutic at 0.51. Will Continue current heparin drip rate of 1100 units/hr and recheck HL in 6 hours.   01/18 HL 0.61 therapeutic. Will continue current rate and will recheck HL/CBC w/ am labs.  Thomasene Ripple, PharmD, BCPS Clinical Pharmacist 07/20/2017 6:10 AM

## 2017-07-20 NOTE — Progress Notes (Signed)
IVs and tele removed from patient. Discharge instructions given to patient. Verbalized understanding. No distress at this time.

## 2017-07-23 ENCOUNTER — Other Ambulatory Visit: Payer: Self-pay

## 2017-07-23 ENCOUNTER — Encounter: Payer: Self-pay | Admitting: *Deleted

## 2017-07-23 ENCOUNTER — Telehealth: Payer: Self-pay | Admitting: *Deleted

## 2017-07-23 DIAGNOSIS — Z9861 Coronary angioplasty status: Secondary | ICD-10-CM

## 2017-07-23 DIAGNOSIS — I214 Non-ST elevation (NSTEMI) myocardial infarction: Secondary | ICD-10-CM

## 2017-07-23 MED ORDER — DIGOXIN 125 MCG PO TABS: 0.0625 mg | ORAL_TABLET | Freq: Every day | ORAL | 3 refills | Status: DC

## 2017-07-23 NOTE — Telephone Encounter (Signed)
Christopher Cummings has been in the hospital for first degree heart block and NSTEMI.  He is home and feeling better.  He will need clearance in order to return to rehab.

## 2017-07-24 ENCOUNTER — Telehealth: Payer: Self-pay | Admitting: Cardiovascular Disease

## 2017-07-24 NOTE — Telephone Encounter (Signed)
S/w patient. He was recently discharged on 07/20/17 from the hospital for chest tightness. Patient has upcoming appointment on 07/31/17. Advised patient to wait until upcoming appointment and re-evaluation and at that time he discuss restarting cardiac rehab with provider. Patient was agreeable with plan.

## 2017-07-24 NOTE — Telephone Encounter (Signed)
Patient states after hospital they told him to stop cardiac rehab He would like to continue going to rehab if possibe Please call to discuss

## 2017-07-25 ENCOUNTER — Ambulatory Visit: Payer: BLUE CROSS/BLUE SHIELD | Admitting: Nurse Practitioner

## 2017-07-31 ENCOUNTER — Encounter: Payer: Self-pay | Admitting: *Deleted

## 2017-07-31 ENCOUNTER — Ambulatory Visit: Payer: BLUE CROSS/BLUE SHIELD | Admitting: Nurse Practitioner

## 2017-07-31 ENCOUNTER — Encounter: Payer: Self-pay | Admitting: Nurse Practitioner

## 2017-07-31 VITALS — BP 92/60 | HR 88 | Ht 69.0 in | Wt 213.8 lb

## 2017-07-31 DIAGNOSIS — I10 Essential (primary) hypertension: Secondary | ICD-10-CM

## 2017-07-31 DIAGNOSIS — I2510 Atherosclerotic heart disease of native coronary artery without angina pectoris: Secondary | ICD-10-CM

## 2017-07-31 DIAGNOSIS — I255 Ischemic cardiomyopathy: Secondary | ICD-10-CM

## 2017-07-31 DIAGNOSIS — I482 Chronic atrial fibrillation: Secondary | ICD-10-CM

## 2017-07-31 DIAGNOSIS — I5022 Chronic systolic (congestive) heart failure: Secondary | ICD-10-CM | POA: Diagnosis not present

## 2017-07-31 DIAGNOSIS — I214 Non-ST elevation (NSTEMI) myocardial infarction: Secondary | ICD-10-CM

## 2017-07-31 DIAGNOSIS — E785 Hyperlipidemia, unspecified: Secondary | ICD-10-CM | POA: Diagnosis not present

## 2017-07-31 DIAGNOSIS — Z9861 Coronary angioplasty status: Secondary | ICD-10-CM

## 2017-07-31 DIAGNOSIS — I4821 Permanent atrial fibrillation: Secondary | ICD-10-CM

## 2017-07-31 NOTE — Progress Notes (Signed)
Office Visit    Patient Name: Christopher Labrie Sr. Date of Encounter: 07/31/2017  Primary Care Provider:  [name], NP Primary Cardiologist:  Kathlyn Sacramento, MD  Chief Complaint    67 year old male with a history of CAD status post prior two-vessel CABG with subsequent stenting and multiple PCI's over the past year, aortic stenosis status post bioprosthetic aortic valve replacement, hypertension, hyperlipidemia, ischemic cardiomyopathy with an EF of 35-40%, HFrEF, diabetes, and history of rectal bleeding, who presents for follow-up after recent hospitalization for atypical chest pain.  Past Medical History    Past Medical History:  Diagnosis Date  . Aortic stenosis    a. 07/2015 s/p bioprosthetic AVR (#23 [employer]) - [place]; b. 04/2017 Echo: nl fxn'ing AoV.  Marland Kitchen CAD (coronary artery disease)    a. 2009 PCI->D1; b. 07/2015 CABG x 2 reported (LIMA-LAD noted on cath 01/2017) [facility] [place]; c. 2017/2018 Prox/Dist LCX & D1 stenting; d. 01/2017 PCI: ISR prox/dist LCX stents (PTCA), ISR D1 (med Rx); e. 04/2017 PCI: CBA for ISR- LCX 90p, 70d, D1 95; f. 05/2017 PCI: D1 99 ISR, LCX 70p (2.25x22 Onyx DES); g. 06/2017 Cath: Patent LCX and RCA stents. D1 100 ISR->Med Rx.  . Chronic systolic CHF (congestive heart failure) ([place])    a. 04/2017 Echo: EF 35-40% with nl functioning bioprosthetic AoV, mild MR, nl [place].  . Colitis   . Diabetes mellitus with complication ([place])   . Diverticulitis   . Essential hypertension   . Hyperlipidemia   . Ischemic cardiomyopathy    a. 04/2017 Echo: EF 35-40%.  . Permanent atrial fibrillation ([place])    a. CHA2DS2VASc = 4-->No OAC 2/2 ongoing ASA/Plavix and h/o rectal bleeding.  . Rectal bleeding    a. 03/2017 -> f/u @ [facility].   Past Surgical History:  Procedure Laterality Date  . AORTIC VALVE REPLACEMENT    . CORONARY ARTERY BYPASS GRAFT    . CORONARY BALLOON ANGIOPLASTY N/A 04/02/2017   Procedure: CORONARY BALLOON ANGIOPLASTY;  Surgeon:  Troy Sine, MD;  Location: [place] [facility];  Service: Cardiovascular;  Laterality: N/A;  . CORONARY STENT INTERVENTION N/A 02/26/2017   Procedure: CORONARY STENT INTERVENTION;  Surgeon: Wellington Hampshire, MD;  Location: [place] [facility];  Service: Cardiovascular;  Laterality: N/A;  . CORONARY STENT INTERVENTION N/A 05/08/2017   Procedure: CORONARY STENT INTERVENTION;  Surgeon: Nelva Bush, MD;  Location: [place] [facility];  Service: Cardiovascular;  Laterality: N/A;  . CORONARY/GRAFT ANGIOGRAPHY N/A 02/26/2017   Procedure: CORONARY/GRAFT ANGIOGRAPHY;  Surgeon: Wellington Hampshire, MD;  Location: [place] [facility];  Service: Cardiovascular;  Laterality: N/A;  . CORONARY/GRAFT ANGIOGRAPHY N/A 04/02/2017   Procedure: CORONARY/GRAFT ANGIOGRAPHY;  Surgeon: Troy Sine, MD;  Location: [place] [facility];  Service: Cardiovascular;  Laterality: N/A;  . CORONARY/GRAFT ANGIOGRAPHY N/A 05/08/2017   Procedure: CORONARY/GRAFT ANGIOGRAPHY;  Surgeon: Nelva Bush, MD;  Location: [place] [facility];  Service: Cardiovascular;  Laterality: N/A;  . JOINT REPLACEMENT Left    KNEE  . KNEE ARTHROPLASTY  2000  . LEFT HEART CATH AND CORONARY ANGIOGRAPHY N/A 06/15/2017   Procedure: LEFT HEART CATH AND CORONARY ANGIOGRAPHY;  Surgeon: Wellington Hampshire, MD;  Location: [place] [facility];  Service: Cardiovascular;  Laterality: N/A;    Allergies  Allergies  Allergen Reactions  . Metformin And Related Other (See Comments)    Only the regular Metformin (diarrhea)    History of Present Illness  67 year old male with the above complex past medical history including coronary artery disease status post prior stenting of the diagonal 2009 followed by CABG x2 performed in [place] in January 2017.  Following that, he required stents to the right coronary artery and proximal and distal left circumflex.  Other history includes aortic stenosis status post bioprosthetic AVR at the time of his  CABG in January 2017, hypertension, hyperlipidemia, diabetes, and intermittent rectal bleeding.  He has had multiple hospitalizations related to chest pain with finding of in-stent restenosis of the diagonal and proximal and distal left circumflex in August 2018, with subsequent PTCA of the proximal and distal left circumflex.  An initial attempt was made at medical therapy of the diagonal branch however, in October 2018, he required cutting but angioplasty within the diagonal stent as well as proximal and distal left circumflex stents, in the setting of recurrent angina.  In late October 2018, he was readmitted with chest pain and underwent stress testing, which was nonischemic.  In November 2018, he was readmitted with recurrent chest pain and non-STEMI, and was found again to have severe narrowing/in-stent restenosis in the proximal left circumflex and diagonal.  The left circumflex was treated with a drug-eluting stent while the diagonal, was managed medically.  In mid December 2018, he was readmitted with chest pain and mild troponin elevation.  Catheterization revealed patent RCA and circumflex stents with a total occlusion of the diagonal.  Medical therapy was recommended.  I last saw him in clinic in late December, at which time he was doing well, but he was readmitted January 17 with a "bubble like sensation" in the center of his chest which she described as feeling as though pill had gotten stuck.  The discomfort moved into his shoulders, back, and head/ears.  This was persistent over 24 hours.  His troponin was minimally elevated with a peak of 0.06, more or less consistent with prior minor elevations.  Symptoms improved significantly with a GI cocktail.  We saw him during hospitalization and felt that symptoms were nonischemic and thus we did not perform any additional ischemic evaluation.  Since his hospitalization, he has done exceptionally well.  He has not had any chest pain.  He has been walking  some but not as frequently as previously, secondary to the cold.  He denies dyspnea, palpitations, PND, orthopnea, dizziness, syncope, edema, or early satiety.  Home Medications    Prior to Admission medications   Medication Sig Start Date End Date Taking? Authorizing Provider  acetaminophen (TYLENOL) 325 MG tablet Take 2 tablets (650 mg total) by mouth every 6 (six) hours as needed for mild pain (or Fever >/= 101). 04/26/17   Nicholes Mango, MD  alum & mag hydroxide-simeth (MAALOX/MYLANTA) 200-200-20 MG/5ML suspension Take 30 mLs by mouth every 6 (six) hours as needed for indigestion or heartburn. 07/20/17   Gladstone Lighter, MD  aspirin EC 81 MG tablet Take 1 tablet (81 mg total) by mouth daily. 06/13/17   Theora Gianotti, NP  atorvastatin (LIPITOR) 80 MG tablet Take 1 tablet (80 mg total) by mouth at bedtime. 04/30/17   [name], NP  blood glucose meter kit and supplies Dispense based on patient and insurance preference. Use 1 time daily as directed. (FOR ICD-9 250.00, 250.01). 04/30/17   [name], NP  Blood Pressure Monitoring (BLOOD PRESSURE CUFF) MISC 1 Units by Does not apply route daily. 04/30/17   [name], NP  clopidogrel (PLAVIX) 75 MG tablet Take 1  tablet (75 mg total) daily by mouth. 05/21/17   Dunn, Areta Haber, PA-C  cyclobenzaprine (FLEXERIL) 5 MG tablet Take 1 tablet (5 mg total) by mouth 3 (three) times daily as needed for muscle spasms. 07/20/17   Gladstone Lighter, MD  digoxin (LANOXIN) 0.125 MG tablet Take 0.5 tablets (0.0625 mg total) by mouth daily. 07/23/17 07/23/18  Rise Mu, PA-C  docusate sodium (COLACE) 100 MG capsule Take 1 capsule (100 mg total) by mouth 2 (two) times daily. 06/29/17   [name], NP  empagliflozin (JARDIANCE) 25 MG TABS tablet Take 25 mg daily by mouth. 05/15/17   [name], NP  isosorbide mononitrate (IMDUR) 60 MG 24 hr tablet Take 1 tablet (60 mg total) by mouth daily. 06/13/17  09/11/17  Theora Gianotti, NP  meclizine (ANTIVERT) 25 MG tablet Take 25 mg by mouth 3 (three) times daily as needed for dizziness.    [provider]  metFORMIN (GLUCOPHAGE-XR) 500 MG 24 hr tablet Take 1 tablet (500 mg total) daily with breakfast by mouth. 05/15/17 05/15/18  [name], NP  metoprolol succinate (TOPROL-XL) 50 MG 24 hr tablet Take one tablet (50 mg) daily if systolic blood pressure is >100 06/21/17   Theora Gianotti, NP  nitroGLYCERIN (NITROSTAT) 0.4 MG SL tablet Place 1 tablet (0.4 mg total) under the tongue every 5 (five) minutes as needed for chest pain. 07/13/17   Wellington Hampshire, MD  ondansetron (ZOFRAN ODT) 4 MG disintegrating tablet Take 1 tablet (4 mg total) by mouth every 8 (eight) hours as needed for nausea or vomiting. 06/25/17   Rudene Re, MD  pantoprazole (PROTONIX) 40 MG tablet Take 1 tablet (40 mg total) daily by mouth. 05/21/17   Dunn, Areta Haber, PA-C  ranolazine (RANEXA) 1000 MG SR tablet Take 1 tablet (1,000 mg total) by mouth 2 (two) times daily. 07/10/17   Wellington Hampshire, MD  spironolactone (ALDACTONE) 25 MG tablet Take 0.5 tablets (12.5 mg total) daily by mouth. 05/21/17 08/19/17  Rise Mu, PA-C    Review of Systems    As above, doing well since hospitalization.  He denies chest pain, palpitations, dyspnea, pnd, orthopnea, n, v, dizziness, syncope, edema, weight gain, or early satiety.  All other systems reviewed and are otherwise negative except as noted above.  Physical Exam    VS:  BP 92/60 (BP Location: Left Arm, Patient Position: Sitting, Cuff Size: Normal)   Pulse 88   Ht 5' 9"  (1.753 m)   Wt 213 lb 12 oz (97 kg)   BMI 31.57 kg/m  , BMI Body mass index is 31.57 kg/m. GEN: Well nourished, well developed, in no acute distress.  HEENT: normal.  Neck: Supple, no JVD, carotid bruits, or masses. Cardiac: RRR, 2/6 systolic murmur at the upper sternal border, no rubs, or gallops. No clubbing, cyanosis,  edema.  Radials/DP/PT 2+ and equal bilaterally.  Respiratory:  Respirations regular and unlabored, clear to auscultation bilaterally. GI: Soft, nontender, nondistended, BS + x 4. MS: no deformity or atrophy. Skin: warm and dry, no rash. Neuro:  Strength and sensation are intact. Psych: Normal affect.  Accessory Clinical Findings    ECG -atrial fibrillation, 88, nonspecific T changes.  Assessment & Plan    1.  Coronary artery disease: Patient with extensive cardiac history as outlined above.  Most recent catheterization in December revealed patent proximal and distal circumflex stents as well as patent right coronary artery stent.  The stent in the diagonal is totally  occluded.  He was recently readmitted with atypical chest discomfort that improved with GI cocktail.  He has had no recurrence of chest pain since his hospitalization.  He will resume cardiac rehab and is cleared to do so.  He remains on aspirin, statin, Plavix, beta-blocker, nitrate, and Ranexa therapy.  2.  Essential hypertension: Blood pressure remains soft but stable.  He has not had any presyncope or orthostasis.  Continue current regimen.  3.  Hyperlipidemia: LDL 36 in October 2018.  Normal LFTs.  Continue statin therapy.  4.  Ischemic cardiomyopathy/HFrEF: EF 35-40%.  His weight has been stable at home and he is euvolemic on exam.  He remains on beta-blocker, spironolactone, and digoxin.  ACE inhibitor was previously discontinued in the setting of low blood pressures and there is no room to add this back now.  5.  Type 2 diabetes mellitus: A1c was 9.3 in November.  This is followed by primary care at [facility].  6.  Aortic stenosis: Status post bioprosthetic AVR.  Echo in October 2018 showed normal valve function.  7.  Permanent atrial fibrillation: Rate controlled on beta-blocker and digoxin.  No oral anticoagulation given ongoing dual antiplatelet therapy with prior history of rectal bleeding in September 2018.  We will need  to reconsider risks and benefits of oral anticoagulation going forward.  Hemoglobins have been stable for several months now.  8.  Disposition: Patient would like to push out follow-up to 3 months.  We will have him see Dr. Fletcher Anon at that time.  Murray Hodgkins, NP 07/31/2017, 12:07 PM

## 2017-07-31 NOTE — Patient Instructions (Signed)
Medication Instructions:  Your physician recommends that you continue on your current medications as directed. Please refer to the Current Medication list given to you today.   Labwork: none  Testing/Procedures: none  Follow-Up: Your physician recommends that you schedule a follow-up appointment in: 3 MONTHS WITH DR ARIDA.  If you need a refill on your cardiac medications before your next appointment, please call your pharmacy.   

## 2017-08-01 ENCOUNTER — Encounter: Payer: Self-pay | Admitting: *Deleted

## 2017-08-01 DIAGNOSIS — Z9861 Coronary angioplasty status: Secondary | ICD-10-CM

## 2017-08-01 DIAGNOSIS — I214 Non-ST elevation (NSTEMI) myocardial infarction: Secondary | ICD-10-CM

## 2017-08-01 NOTE — Progress Notes (Signed)
Cardiac Individual Treatment Plan  Patient Details  Name: Christopher Crisco Sr. MRN: [id number] Date of Birth: Nov 15, 1950 Referring Provider:     Cardiac Rehab from 05/21/2017 in [facility] Cardiac and Pulmonary Rehab  Referring Provider  Kathlyn Sacramento MD      Initial Encounter Date:    Cardiac Rehab from 05/21/2017 in [facility] Cardiac and Pulmonary Rehab  Date  05/21/17  Referring Provider  Kathlyn Sacramento MD      Visit Diagnosis: NSTEMI (non-ST elevated myocardial infarction) ([place])  S/P PTCA (percutaneous transluminal coronary angioplasty)  Patient's Home Medications on Admission:  Current Outpatient Medications:  .  acetaminophen (TYLENOL) 325 MG tablet, Take 2 tablets (650 mg total) by mouth every 6 (six) hours as needed for mild pain (or Fever >/= 101)., Disp: , Rfl:  .  alum & mag hydroxide-simeth (MAALOX/MYLANTA) 200-200-20 MG/5ML suspension, Take 30 mLs by mouth every 6 (six) hours as needed for indigestion or heartburn., Disp: 355 mL, Rfl: 0 .  aspirin EC 81 MG tablet, Take 1 tablet (81 mg total) by mouth daily., Disp: , Rfl:  .  atorvastatin (LIPITOR) 80 MG tablet, Take 1 tablet (80 mg total) by mouth at bedtime., Disp: 90 tablet, Rfl: 3 .  blood glucose meter kit and supplies, Dispense based on patient and insurance preference. Use 1 time daily as directed. (FOR ICD-9 250.00, 250.01)., Disp: 1 each, Rfl: 0 .  Blood Pressure Monitoring (BLOOD PRESSURE CUFF) MISC, 1 Units by Does not apply route daily., Disp: 1 each, Rfl: 0 .  clopidogrel (PLAVIX) 75 MG tablet, Take 1 tablet (75 mg total) daily by mouth., Disp: 90 tablet, Rfl: 3 .  cyclobenzaprine (FLEXERIL) 5 MG tablet, Take 1 tablet (5 mg total) by mouth 3 (three) times daily as needed for muscle spasms., Disp: 20 tablet, Rfl: 0 .  digoxin (LANOXIN) 0.125 MG tablet, Take 0.5 tablets (0.0625 mg total) by mouth daily., Disp: 15 tablet, Rfl: 3 .  docusate sodium (COLACE) 100 MG capsule, Take 1 capsule (100 mg total) by mouth 2 (two)  times daily., Disp: 60 capsule, Rfl: 5 .  empagliflozin (JARDIANCE) 25 MG TABS tablet, Take 25 mg daily by mouth., Disp: 30 tablet, Rfl: 5 .  isosorbide mononitrate (IMDUR) 60 MG 24 hr tablet, Take 1 tablet (60 mg total) by mouth daily., Disp: 60 tablet, Rfl: 6 .  meclizine (ANTIVERT) 25 MG tablet, Take 25 mg by mouth 3 (three) times daily as needed for dizziness., Disp: , Rfl:  .  metFORMIN (GLUCOPHAGE-XR) 500 MG 24 hr tablet, Take 1 tablet (500 mg total) daily with breakfast by mouth., Disp: 30 tablet, Rfl: 5 .  metoprolol succinate (TOPROL-XL) 50 MG 24 hr tablet, Take one tablet (50 mg) daily if systolic blood pressure is >100, Disp: 30 tablet, Rfl: 3 .  nitroGLYCERIN (NITROSTAT) 0.4 MG SL tablet, Place 1 tablet (0.4 mg total) under the tongue every 5 (five) minutes as needed for chest pain., Disp: 25 tablet, Rfl: 0 .  ondansetron (ZOFRAN ODT) 4 MG disintegrating tablet, Take 1 tablet (4 mg total) by mouth every 8 (eight) hours as needed for nausea or vomiting., Disp: 20 tablet, Rfl: 0 .  pantoprazole (PROTONIX) 40 MG tablet, Take 1 tablet (40 mg total) daily by mouth., Disp: 90 tablet, Rfl: 3 .  ranolazine (RANEXA) 1000 MG SR tablet, Take 1 tablet (1,000 mg total) by mouth 2 (two) times daily., Disp: 180 tablet, Rfl: 0 .  spironolactone (ALDACTONE) 25 MG tablet, Take 0.5 tablets (12.5 mg total) daily by  mouth., Disp: 45 tablet, Rfl: 3  Past Medical History: Past Medical History:  Diagnosis Date  . Aortic stenosis    a. 07/2015 s/p bioprosthetic AVR (#23 [employer]) - [place]; b. 04/2017 Echo: nl fxn'ing AoV.  Marland Kitchen CAD (coronary artery disease)    a. 2009 PCI->D1; b. 07/2015 CABG x 2 reported (LIMA-LAD noted on cath 01/2017) [facility] - [place]; c. 2017/2018 Prox/Dist LCX & D1 stenting; d. 01/2017 PCI: ISR prox/dist LCX stents (PTCA), ISR D1 (med Rx); e. 04/2017 PCI: CBA for ISR- LCX 90p, 70d, D1 95; f. 05/2017 PCI: D1 99 ISR, LCX 70p (2.25x22 Onyx DES); g. 06/2017 Cath: Patent LCX and RCA  stents. D1 100 ISR->Med Rx.  . Chronic systolic CHF (congestive heart failure) ([place])    a. 04/2017 Echo: EF 35-40% with nl functioning bioprosthetic AoV, mild MR, nl [place].  . Colitis   . Diabetes mellitus with complication ([place])   . Diverticulitis   . Essential hypertension   . Hyperlipidemia   . Ischemic cardiomyopathy    a. 04/2017 Echo: EF 35-40%.  . Permanent atrial fibrillation ([place])    a. CHA2DS2VASc = 4-->No OAC 2/2 ongoing ASA/Plavix and h/o rectal bleeding.  . Rectal bleeding    a. 03/2017 -> f/u @ [facility].    Tobacco Use: Social History   Tobacco Use  Smoking Status Never Smoker  Smokeless Tobacco Never Used    Labs: Recent Review Flowsheet Data    Labs for ITP Cardiac and Pulmonary Rehab Latest Ref Rng & Units 02/25/2017 03/31/2017 04/25/2017 05/07/2017   Cholestrol 0 - 200 mg/dL 89 108 80 -   LDLCALC 0 - 99 mg/dL 42 43 36 -   HDL >40 mg/dL 35(Christopher Cummings) 37(Christopher Cummings) 29(Christopher Cummings) -   Trlycerides <150 mg/dL 60 138 77 -   Hemoglobin A1c 4.8 - 5.6 % - 8.1(H) - 9.3(H)       Exercise Target Goals:    Exercise Program Goal: Individual exercise prescription set using results from initial 6 min walk test and THRR while considering  patient's activity barriers and safety.   Exercise Prescription Goal: Initial exercise prescription builds to 30-45 minutes a day of aerobic activity, 2-3 days per week.  Home exercise guidelines will be given to patient during program as part of exercise prescription that the participant will acknowledge.  Activity Barriers & Risk Stratification: Activity Barriers & Cardiac Risk Stratification - 05/21/17 1222      Activity Barriers & Cardiac Risk Stratification   Activity Barriers  Other (comment);Deconditioning;Muscular Weakness;Balance Concerns;Decreased Ventricular Function    Comments  Vertigo    Cardiac Risk Stratification  High       6 Minute Walk: 6 Minute Walk    Row Name 05/21/17 1221         6 Minute Walk   Phase  Initial     Distance  1165  feet     Walk Time  6 minutes     # of Rest Breaks  0     MPH  2.21     METS  2.51     RPE  7     VO2 Peak  8.8     Symptoms  No     Resting HR  80 bpm     Resting BP  132/70     Resting Oxygen Saturation   96 %     Exercise Oxygen Saturation  during 6 min walk  97 %     Max Ex. HR  115 bpm     Max Ex. BP  132/76     2 Minute Post BP  126/60        Oxygen Initial Assessment:   Oxygen Re-Evaluation:   Oxygen Discharge (Final Oxygen Re-Evaluation):   Initial Exercise Prescription: Initial Exercise Prescription - 05/21/17 1200      Date of Initial Exercise RX and Referring Provider   Date  05/21/17    Referring Provider  Kathlyn Sacramento MD      Recumbant Bike   Level  2    RPM  50    Watts  16    Minutes  15    METs  2.5      NuStep   Level  2    SPM  80    Minutes  15    METs  2.5      Track   Laps  32    Minutes  15    METs  2.45      Prescription Details   Frequency (times per week)  3    Duration  Progress to 45 minutes of aerobic exercise without signs/symptoms of physical distress      Intensity   THRR 40-80% of Max Heartrate  110-141    Ratings of Perceived Exertion  11-13    Perceived Dyspnea  0-4      Progression   Progression  Continue to progress workloads to maintain intensity without signs/symptoms of physical distress.      Resistance Training   Training Prescription  Yes    Weight  3 lbs    Reps  10-15       Perform Capillary Blood Glucose checks as needed.  Exercise Prescription Changes: Exercise Prescription Changes    Row Name 05/21/17 1200 05/29/17 1300 06/07/17 0900 06/13/17 1300 06/28/17 1400     Response to Exercise   Blood Pressure (Admit)  132/70  124/72  -  124/64  124/56   Blood Pressure (Exercise)  132/76  130/72  -  126/84  124/56   Blood Pressure (Exit)  126/60  126/64  -  120/62  118/60   Heart Rate (Admit)  80 bpm  101 bpm  -  96 bpm  93 bpm   Heart Rate (Exercise)  115 bpm  120 bpm  -  127 bpm  107 bpm    Heart Rate (Exit)  80 bpm  87 bpm  -  90 bpm  91 bpm   Oxygen Saturation (Admit)  96 %  -  -  -  -   Oxygen Saturation (Exercise)  97 %  -  -  -  -   Rating of Perceived Exertion (Exercise)  7  13  -  12  12   Symptoms  none  none  -  none  none   Comments  walk test results  first full day of exercise  -  -  -   Duration  -  Continue with 45 min of aerobic exercise without signs/symptoms of physical distress.  -  Continue with 45 min of aerobic exercise without signs/symptoms of physical distress.  Continue with 45 min of aerobic exercise without signs/symptoms of physical distress.   Intensity  -  THRR unchanged  -  THRR unchanged  THRR unchanged     Progression   Progression  -  Continue to progress workloads to maintain intensity without signs/symptoms of physical distress.  -  Continue to progress workloads to maintain intensity  without signs/symptoms of physical distress.  Continue to progress workloads to maintain intensity without signs/symptoms of physical distress.   Average METs  -  2.67  -  2.38  2.43     Resistance Training   Training Prescription  -  Yes  -  Yes  Yes   Weight  -  5 lbs  -  5 lbs  5 lbs   Reps  -  10-15  -  10-15  10-15     Interval Training   Interval Training  -  No  -  No  No     Recumbant Bike   Level  -  2  -  2  2   Watts  -  20  -  16  16   Minutes  -  15  -  15  15   METs  -  2.63  -  2.47  2.47     NuStep   Level  -  2  -  2  -   Minutes  -  15  -  15  -   METs  -  2.7  -  2.3  -     Track   Laps  -  37  -  30  30   Minutes  -  15  -  15  15   METs  -  2.7  -  2.38  2.38     Home Exercise Plan   Plans to continue exercise at  -  -  Home (comment) walk or at the mall  Home (comment) walk or at the mall  Home (comment) walk or at the mall   Frequency  -  -  Add 1 additional day to program exercise sessions.  Add 1 additional day to program exercise sessions.  Add 1 additional day to program exercise sessions.   Initial Home Exercises  Provided  -  -  06/07/17  06/07/17  06/07/17   Row Name 07/10/17 1600 07/23/17 1600           Response to Exercise   Blood Pressure (Admit)  122/64  126/70      Blood Pressure (Exercise)  172/78  130/74      Blood Pressure (Exit)  132/64  112/60      Heart Rate (Admit)  83 bpm  88 bpm      Heart Rate (Exercise)  123 bpm  110 bpm      Heart Rate (Exit)  98 bpm  103 bpm      Rating of Perceived Exertion (Exercise)  16  14      Symptoms  none  none      Duration  Continue with 30 min of aerobic exercise without signs/symptoms of physical distress.  Continue with 30 min of aerobic exercise without signs/symptoms of physical distress.      Intensity  THRR unchanged  THRR unchanged        Progression   Progression  Continue to progress workloads to maintain intensity without signs/symptoms of physical distress.  Continue to progress workloads to maintain intensity without signs/symptoms of physical distress.      Average METs  2.62  2.72        Resistance Training   Training Prescription  Yes  Yes      Weight  5 lbs  5 lbs      Reps  10-15  10-15        Interval Training  Interval Training  No  No        Recumbant Bike   Level  2  2      Watts  16  26      Minutes  15  15      METs  2.47  2.79        NuStep   Level  2  2      Minutes  15  15      METs  3  3        Track   Laps  30  30      Minutes  15  15      METs  2.38  2.38        Home Exercise Plan   Plans to continue exercise at  Home (comment) walk or at the mall  Home (comment) walk or at the mall      Frequency  Add 1 additional day to program exercise sessions.  Add 1 additional day to program exercise sessions.      Initial Home Exercises Provided  06/07/17  06/07/17         Exercise Comments: Exercise Comments    Row Name 05/23/17 0754 06/07/17 0914         Exercise Comments  First full day of exercise!  Patient was oriented to gym and equipment including functions, settings, policies, and procedures.   Patient's individual exercise prescription and treatment plan were reviewed.  All starting workloads were established based on the results of the 6 minute walk test done at initial orientation visit.  The plan for exercise progression was also introduced and progression will be customized based on patient's performance and goals  Reviewed home exercise with pt today.  Pt plans to walk for exercise.  Reviewed THR, pulse, RPE, sign and symptoms, NTG use, and when to call 911 or MD.  Also discussed weather considerations and indoor options.  Pt voiced understanding.         Exercise Goals and Review: Exercise Goals    Row Name 05/21/17 1224             Exercise Goals   Increase Physical Activity  Yes       Intervention  Provide advice, education, support and counseling about physical activity/exercise needs.;Develop an individualized exercise prescription for aerobic and resistive training based on initial evaluation findings, risk stratification, comorbidities and participant's personal goals.       Expected Outcomes  Achievement of increased cardiorespiratory fitness and enhanced flexibility, muscular endurance and strength shown through measurements of functional capacity and personal statement of participant.       Increase Strength and Stamina  Yes       Intervention  Provide advice, education, support and counseling about physical activity/exercise needs.;Develop an individualized exercise prescription for aerobic and resistive training based on initial evaluation findings, risk stratification, comorbidities and participant's personal goals.       Expected Outcomes  Achievement of increased cardiorespiratory fitness and enhanced flexibility, muscular endurance and strength shown through measurements of functional capacity and personal statement of participant.       Able to understand and use rate of perceived exertion (RPE) scale  Yes       Intervention  Provide education and explanation on  how to use RPE scale       Expected Outcomes  Short Term: Able to use RPE daily in rehab to express subjective intensity level;Long Term:  Able to use  RPE to guide intensity level when exercising independently       Knowledge and understanding of Target Heart Rate Range (THRR)  Yes       Intervention  Provide education and explanation of THRR including how the numbers were predicted and where they are located for reference       Expected Outcomes  Short Term: Able to state/look up THRR;Long Term: Able to use THRR to govern intensity when exercising independently;Short Term: Able to use daily as guideline for intensity in rehab       Able to check pulse independently  Yes       Intervention  Provide education and demonstration on how to check pulse in carotid and radial arteries.;Review the importance of being able to check your own pulse for safety during independent exercise       Expected Outcomes  Short Term: Able to explain why pulse checking is important during independent exercise;Long Term: Able to check pulse independently and accurately       Understanding of Exercise Prescription  Yes       Intervention  Provide education, explanation, and written materials on patient's individual exercise prescription       Expected Outcomes  Short Term: Able to explain program exercise prescription;Long Term: Able to explain home exercise prescription to exercise independently          Exercise Goals Re-Evaluation : Exercise Goals Re-Evaluation    Row Name 05/23/17 0755 05/29/17 1353 06/07/17 0914 06/28/17 1341 07/10/17 1640     Exercise Goal Re-Evaluation   Exercise Goals Review  Understanding of Exercise Prescription;Knowledge and understanding of Target Heart Rate Range (THRR);Able to understand and use rate of perceived exertion (RPE) scale;Able to check pulse independently  Understanding of Exercise Prescription;Increase Physical Activity;Increase Strength and Stamina  Increase Physical  Activity;Able to check pulse independently;Able to understand and use rate of perceived exertion (RPE) scale;Increase Strength and Stamina;Knowledge and understanding of Target Heart Rate Range (THRR);Understanding of Exercise Prescription  Increase Physical Activity;Increase Strength and Stamina;Understanding of Exercise Prescription  Increase Physical Activity;Increase Strength and Stamina;Understanding of Exercise Prescription   Comments  Reviewed RPE scale, THR and program prescription with pt today.  Pt voiced understanding and was given a copy of goals to take home.   Callie completed his first full day of exercise.  He did well.  We will continue to monitor his progression.  Reviewed home exercise with pt today.  Pt plans to walk for exercise.  Reviewed THR, pulse, RPE, sign and symptoms, NTG use, and when to call 911 or MD.  Also discussed weather considerations and indoor options.  Pt voiced understanding.  Christopher Cummings was cleared to return to rehab after his follow up office visit.  He returned today and was able to complete his workloads without a problem.  He was disappointed in himself that he could not exercise for the last week due to a UTI.  He is ready to get back into his home exercise as he has missed his walking regimine.  We will continue to monitor his progression.  Christopher Cummings continues to do well in rehab.  He has been getting up to 30 laps now regularly again!  So far he has not had chest pain during exercise since returning.  We will continue to monitor his progresion.    Expected Outcomes  Short: Use RPE daily to regulate intensity.  Long: Follow program prescription in THR.  Short: Continue to attend Cardiac Rehab.  Long: Follow program prescription.  Short - Pt will add one day per week outside program sessions Long -Pt will exercise independently  Short: Continue to attend rehab regularly.  Long: Continue to exercise regularly.   Short: Try to increase workloads again.  Long: Continue to  work on Animator.    [place] Name 07/23/17 1649             Exercise Goal Re-Evaluation   Exercise Goals Review  Increase Physical Activity;Increase Strength and Stamina;Understanding of Exercise Prescription       Comments  Christopher Cummings had been doing well in rehab.  He had not been feeling well off and on.  He is now up to 26 watts on the recumbent  bike.  Once he is cleared to return, we will continue to monitor his progression.        Expected Outcomes  Short: Cleared to return to rehab.  Long: Continue to build strength and stamina.           Discharge Exercise Prescription (Final Exercise Prescription Changes): Exercise Prescription Changes - 07/23/17 1600      Response to Exercise   Blood Pressure (Admit)  126/70    Blood Pressure (Exercise)  130/74    Blood Pressure (Exit)  112/60    Heart Rate (Admit)  88 bpm    Heart Rate (Exercise)  110 bpm    Heart Rate (Exit)  103 bpm    Rating of Perceived Exertion (Exercise)  14    Symptoms  none    Duration  Continue with 30 min of aerobic exercise without signs/symptoms of physical distress.    Intensity  THRR unchanged      Progression   Progression  Continue to progress workloads to maintain intensity without signs/symptoms of physical distress.    Average METs  2.72      Resistance Training   Training Prescription  Yes    Weight  5 lbs    Reps  10-15      Interval Training   Interval Training  No      Recumbant Bike   Level  2    Watts  26    Minutes  15    METs  2.79      NuStep   Level  2    Minutes  15    METs  3      Track   Laps  30    Minutes  15    METs  2.38      Home Exercise Plan   Plans to continue exercise at  Home (comment) walk or at the mall    Frequency  Add 1 additional day to program exercise sessions.    Initial Home Exercises Provided  06/07/17       Nutrition:  Target Goals: Understanding of nutrition guidelines, daily intake of sodium <1536m, cholesterol <2070m  calories 30% from fat and 7% or less from saturated fats, daily to have 5 or more servings of fruits and vegetables.  Biometrics: Pre Biometrics - 05/21/17 1225      Pre Biometrics   Height  5' 9.25" (1.759 m)    Weight  223 lb 6.4 oz (101.3 kg)    Waist Circumference  43.5 inches    Hip Circumference  41 inches    Waist to Hip Ratio  1.06 %    BMI (Calculated)  32.75    Single Leg Stand  1.22 seconds        Nutrition Therapy  Plan and Nutrition Goals: Nutrition Therapy & Goals - 07/05/17 1046      Nutrition Therapy   Diet  TLC    Drug/Food Interactions  Statins/Certain Fruits    Protein (specify units)  12oz    Fiber  30 grams    Saturated Fats  16 max. grams    Sodium  1500 grams      Personal Nutrition Goals   Nutrition Goal  Continue to increase daily walking milage    Personal Goal #2  Try consuming 4oz of fruit juice or a couple pieces of hard candy such as life savers when blood sugar readings are low, wait 9mn and test blood glucose again    Personal Goal #3  Choose white meats when eating out more often to help reduce total sodium and fat in the meal    Comments  patient is a diabetic but does not follow a strict diabetic diet. tries to eat more vegetables than starches. does not eat consistently throughout the day      Intervention Plan   Intervention  Prescribe, educate and counsel regarding individualized specific dietary modifications aiming towards targeted core components such as weight, hypertension, lipid management, diabetes, heart failure and other comorbidities.    Expected Outcomes  Short Term Goal: Understand basic principles of dietary content, such as calories, fat, sodium, cholesterol and nutrients.;Short Term Goal: A plan has been developed with personal nutrition goals set during dietitian appointment.;Long Term Goal: Adherence to prescribed nutrition plan.       Nutrition Assessments: Nutrition Assessments - 05/21/17 1221      MEDFICTS Scores    Pre Score  24       Nutrition Goals Re-Evaluation: Nutrition Goals Re-Evaluation    Row Name 06/28/17 1418             Goals   Current Weight  220 lb (99.8 kg)       Nutrition Goal  Meet with dietician and talk about appetite       Comment  Scheduled an appt with dietician during class on Jan 3 to talk about improving his appetite.       Expected Outcome  Short: Meet with dietician  Long: Follow recommendations set bu dietician          Nutrition Goals Discharge (Final Nutrition Goals Re-Evaluation): Nutrition Goals Re-Evaluation - 06/28/17 1418      Goals   Current Weight  220 lb (99.8 kg)    Nutrition Goal  Meet with dietician and talk about appetite    Comment  Scheduled an appt with dietician during class on Jan 3 to talk about improving his appetite.    Expected Outcome  Short: Meet with dietician  Long: Follow recommendations set bu dietician       Psychosocial: Target Goals: Acknowledge presence or absence of significant depression and/or stress, maximize coping skills, provide positive support system. Participant is able to verbalize types and ability to use techniques and skills needed for reducing stress and depression.   Initial Review & Psychosocial Screening: Initial Psych Review & Screening - 05/21/17 1224      Initial Review   Current issues with  Current Stress Concerns    Source of Stress Concerns  Chronic Illness;Transportation    Comments  [place]. He has recently moved in with his sister and brother in law who have a 230year old autistic son in a group home.  CBracysays  he misses his 3 year old Grandchildren that he took care of every other weekend for his son and daughter in law. Christopher Cummings admits that was his reason to quit drinking 13 years ago. He said he had his first heart attack 13 years ago about a month after he quit drinking a pint of liquor on Friday and a 5th or 2 on Saturday night.  Christopher Cummings recently retired from working at [employer] for [employer] years and said he has been in and out of the hospital every since. Christopher Cummings reports that he has never smoked. Christopher Cummings said he used to go to a large mall outside of [place] and sometimes walk 10 miles but is frustrated since he has still had heart problems including his recenlty NSTEMI/PTCA.  Christopher Cummings said he doesn't eat like he should and isn't hungry much. He reports his blood sugars in the am are sometimes 250 but sometimes 120. Discussed maybe eating something since he takes long acting Metformin. The short acting Metformin is listed as an allergy since it causes diarrhea so much. Christopher Cummings carries his NTG sl and knows to discard it once it is opened per his MD he said he discards it after 6 months. He has a new bottle ready to start next week. Christopher Cummings given infor about [employer] and bus line in case his family can't drive him. Christopher Cummings said he really doesn't want to drive at times since he has vertigo at times      [place]?  Yes      Barriers   Psychosocial barriers to participate in program  The patient should benefit from training in stress management and relaxation.      Screening Interventions   Interventions  Yes;Encouraged to exercise;To provide support and resources with identified psychosocial needs    Expected Outcomes  Short Term goal: Utilizing psychosocial counselor, staff and physician to assist with identification of specific Stressors or current issues interfering with healing process. Setting desired goal for each stressor or current issue identified.;Long Term Goal: Stressors or current issues are controlled or eliminated.;Short Term goal: Identification and review with participant of any Quality of Life or Depression concerns found by scoring the questionnaire.;Long Term goal: The participant improves quality of Life and PHQ9 Scores as seen by post scores and/or verbalization of changes        Quality of Life Scores:  Quality of Life - 05/21/17 1225      Quality of Life Scores   Health/Function Pre  18.43 %    Socioeconomic Pre  25.64 %    Psych/Spiritual Pre  24 %    Family Pre  27 %    GLOBAL Pre  22.18 %      Scores of 19 and below usually indicate a poorer quality of life in these areas.  A difference of  2-3 points is a clinically meaningful difference.  A difference of 2-3 points in the total score of the Quality of Life Index has been associated with significant improvement in overall quality of life, self-image, physical symptoms, and general health in studies assessing change in quality of life.  PHQ-9: Recent Review Flowsheet Data    Depression screen [facility] 2/9 07/04/2017 05/21/2017 05/11/2017 04/30/2017   Decreased Interest 0 0 0 0   Down, Depressed, Hopeless 0 0 0 0   PHQ - 2 Score 0 0 0 0   Altered sleeping - 1 - -   Tired, decreased energy - 1 - -  Change in appetite - 1 - -   Feeling bad or failure about yourself  - 0 - -   Trouble concentrating - 0 - -   Moving slowly or fidgety/restless - 2 - -   Suicidal thoughts - 0 - -   PHQ-9 Score - 5 - -   Difficult doing work/chores - Somewhat difficult - -     Interpretation of Total Score  Total Score Depression Severity:  1-4 = Minimal depression, 5-9 = Mild depression, 10-14 = Moderate depression, 15-19 = Moderately severe depression, 20-27 = Severe depression   Psychosocial Evaluation and Intervention: Psychosocial Evaluation - 05/23/17 0940      Psychosocial Evaluation & Interventions   Interventions  Encouraged to exercise with the program and follow exercise prescription;Relaxation education;Stress management education    Comments  Counselor met with Mr. Lambert Duris) today for initial psychosocial evaluation.  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  He reports having been in the hospital 5x since  August with chest pain, etc.  He reports the last stent two weeks ago has made the most change in his increased energy levels and mood.  Bayne has other health issues with diabetes, colitis, diverticulitis, vertigo and high blood pressure.  He reports sleeping well - sometimes "too much."  He finally got his appetite back recently and he was pleased about that the day before Thanksgiving!  Ronn denies a history of depression or anxiety or any current symptoms and he reports his mood is generally positive most of the time.  Audie has multiple stressors with moving from [place] to [place] this past August.  His health is also an issue.  But he mostly is missing his twin 19 year old grandchildren in [place] currently.  He has goals to lose weight; be able to walk up to 10 miles a day - as he reports doing often prior to his cardiact issues; and he would like to feel more energy and less tired.  Staff will follow with Christopher Cummings throughout the course of this program.      Expected Outcomes  Christopher Cummings will benefit from consistent exercise to achieve his stated goals.  He will be meeting with the dietician to address his weight loss goals.  The educational and psychoeducational components of this program will be helpful in [place] learning more about his condition and positive ways to cope.  Staff will follow.    Continue Psychosocial Services   Follow up required by staff       Psychosocial Re-Evaluation: Psychosocial Re-Evaluation    [place] Name 06/07/17 1252 06/28/17 1422           Psychosocial Re-Evaluation   Current issues with  Current Stress Concerns  Current Stress Concerns      Comments  Unable to afford Ranexa since $700/month so [employer] stopped that and increased his Imdur 7m. CTavinis going to verify with Dr. AFletcher Anonif he is suppose to ahv 937mImdur.   Johnie's health has continued to be his biggest stressor.  He feels that he does not have a lot of other stress and tries not to let anything get to  him too much.  He has also been disappointed that he has not been able to exercise as much as he would like to do.  His sleep continues to be a struggle because he is constantly getting up  to go to the bathroom throughout the night.  He tries to make the best of it.  He has found the exercise in the program to be very helpful and is helping get healthier as well.       Expected Outcomes  -  Short: Continue to attend rehab regularly. Long: Continue to work to improving health.      Interventions  -  Encouraged to attend Cardiac Rehabilitation for the exercise;Stress management education      Continue Psychosocial Services   -  Follow up required by staff        Initial Review   Source of Stress Concerns  -  Unable to perform yard/household activities;Chronic Illness;Unable to participate in former interests or hobbies;Transportation         Psychosocial Discharge (Final Psychosocial Re-Evaluation): Psychosocial Re-Evaluation - 06/28/17 1422      Psychosocial Re-Evaluation   Current issues with  Current Stress Concerns    Comments  Christopher Cummings health has continued to be his biggest stressor.  He feels that he does not have a lot of other stress and tries not to let anything get to him too much.  He has also been disappointed that he has not been able to exercise as much as he would like to do.  His sleep continues to be a struggle because he is constantly getting up to go to the bathroom throughout the night.  He tries to make the best of it.  He has found the exercise in the program to be very helpful and is helping get healthier as well.     Expected Outcomes  Short: Continue to attend rehab regularly. Long: Continue to work to improving health.    Interventions  Encouraged to attend Cardiac Rehabilitation for the exercise;Stress management education    Continue Psychosocial Services   Follow up required by staff      Initial Review   Source of Stress Concerns  Unable to perform yard/household  activities;Chronic Illness;Unable to participate in former interests or hobbies;Transportation       Vocational Rehabilitation: Provide vocational rehab assistance to qualifying candidates.   Vocational Rehab Evaluation & Intervention: Vocational Rehab - 05/21/17 1206      Initial Vocational Rehab Evaluation & Intervention   Assessment shows need for Vocational Rehabilitation  No       Education: Education Goals: Education classes will be provided on a variety of topics geared toward better understanding of heart health and risk factor modification. Participant will state understanding/return demonstration of topics presented as noted by education test scores.  Learning Barriers/Preferences: Learning Barriers/Preferences - 05/21/17 1204      Learning Barriers/Preferences   Learning Barriers  Hearing    Learning Preferences  Group Instruction       Education Topics:  AED/CPR: - Group verbal and written instruction with the use of models to demonstrate the basic use of the AED with the basic ABC's of resuscitation.   General Nutrition Guidelines/Fats and Fiber: -Group instruction provided by verbal, written material, models and posters to present the general guidelines for heart healthy nutrition. Gives an explanation and review of dietary fats and fiber.   Controlling Sodium/Reading Food Labels: -Group verbal and written material supporting the discussion of sodium use in heart healthy nutrition. Review and explanation with models, verbal and written materials for utilization of the food label.   Exercise Physiology & General Exercise Guidelines: - Group verbal and written instruction with models to review the exercise physiology of the  cardiovascular system and associated critical values. Provides general exercise guidelines with specific guidelines to those with heart or lung disease.    Cardiac Rehab from 07/17/2017 in [facility] Cardiac and Pulmonary Rehab  Date  06/28/17   Educator  [facility]  Instruction Review Code  1- Verbalizes Understanding      Aerobic Exercise & Resistance Training: - Gives group verbal and written instruction on the various components of exercise. Focuses on aerobic and resistive training programs and the benefits of this training and how to safely progress through these programs..   Cardiac Rehab from 07/17/2017 in [facility] Cardiac and Pulmonary Rehab  Date  07/05/17  Educator  [facility]  Instruction Review Code  1- Verbalizes Understanding      Flexibility, Balance, Mind/Body Relaxation: Provides group verbal/written instruction on the benefits of flexibility and balance training, including mind/body exercise modes such as yoga, pilates and tai chi.  Demonstration and skill practice provided.   Cardiac Rehab from 07/17/2017 in [facility] Cardiac and Pulmonary Rehab  Date  07/10/17  Educator  [facility]  Instruction Review Code  1- Verbalizes Understanding      Stress and Anxiety: - Provides group verbal and written instruction about the health risks of elevated stress and causes of high stress.  Discuss the correlation between heart/lung disease and anxiety and treatment options. Review healthy ways to manage with stress and anxiety.   Depression: - Provides group verbal and written instruction on the correlation between heart/lung disease and depressed mood, treatment options, and the stigmas associated with seeking treatment.   Cardiac Rehab from 07/17/2017 in [facility] Cardiac and Pulmonary Rehab  Date  06/07/17  Educator  [facility]  Instruction Review Code  1- Verbalizes Understanding      Anatomy & Physiology of the Heart: - Group verbal and written instruction and models provide basic cardiac anatomy and physiology, with the coronary electrical and arterial systems. Review of Valvular disease and Heart Failure   Cardiac Procedures: - Group verbal and written instruction to review commonly prescribed medications for heart disease. Reviews the medication,  class of the drug, and side effects. Includes the steps to properly store meds and maintain the prescription regimen. (beta blockers and nitrates)   Cardiac Medications I: - Group verbal and written instruction to review commonly prescribed medications for heart disease. Reviews the medication, class of the drug, and side effects. Includes the steps to properly store meds and maintain the prescription regimen.   Cardiac Rehab from 07/17/2017 in [facility] Cardiac and Pulmonary Rehab  Date  07/17/17  Educator  [facility]  Instruction Review Code  1- Verbalizes Understanding      Cardiac Medications II: -Group verbal and written instruction to review commonly prescribed medications for heart disease. Reviews the medication, class of the drug, and side effects. (all other drug classes)   Cardiac Rehab from 07/17/2017 in [facility] Cardiac and Pulmonary Rehab  Date  07/12/17  Educator  [facility]  Instruction Review Code  1- Verbalizes Understanding       Go Sex-Intimacy & Heart Disease, Get SMART - Goal Setting: - Group verbal and written instruction through game format to discuss heart disease and the return to sexual intimacy. Provides group verbal and written material to discuss and apply goal setting through the application of the S.M.A.R.T. Method.   Other Matters of the Heart: - Provides group verbal, written materials and models to describe Stable Angina and Peripheral Artery. Includes description of the disease process and treatment options available to the cardiac patient.  Exercise & Equipment Safety: - Individual verbal instruction and demonstration of equipment use and safety with use of the equipment.   Cardiac Rehab from 07/17/2017 in [facility] Cardiac and Pulmonary Rehab  Date  05/21/17  Educator  C.EnterkinRN  Instruction Review Code  3- Needs Reinforcement      Infection Prevention: - Provides verbal and written material to individual with discussion of infection control including proper hand  washing and proper equipment cleaning during exercise session.   Cardiac Rehab from 07/17/2017 in [facility] Cardiac and Pulmonary Rehab  Date  05/21/17  Educator  Loletha Grayer ENterkinRN  Instruction Review Code  1- Verbalizes Understanding      Falls Prevention: - Provides verbal and written material to individual with discussion of falls prevention and safety.   Cardiac Rehab from 07/17/2017 in [facility] Cardiac and Pulmonary Rehab  Date  05/21/17  Educator  C. ENterkinRN  Instruction Review Code  1- Verbalizes Understanding      Diabetes: - Individual verbal and written instruction to review signs/symptoms of diabetes, desired ranges of glucose level fasting, after meals and with exercise. Acknowledge that pre and post exercise glucose checks will be done for 3 sessions at entry of program.   Cardiac Rehab from 07/17/2017 in [facility] Cardiac and Pulmonary Rehab  Date  05/21/17  Educator  C. [place]  Instruction Review Code  3- Needs Reinforcement      Know Your Numbers and Risk Factors: -Group verbal and written instruction about important numbers in your health.  Discussion of what are risk factors and how they play a role in the disease process.  Review of Cholesterol, Blood Pressure, Diabetes, and BMI and the role they play in your overall health.   Cardiac Rehab from 07/17/2017 in [facility] Cardiac and Pulmonary Rehab  Date  07/12/17  Educator  [facility]  Instruction Review Code  1- Verbalizes Understanding      Sleep Hygiene: -Provides group verbal and written instruction about how sleep can affect your health.  Define sleep hygiene, discuss sleep cycles and impact of sleep habits. Review good sleep hygiene tips.    Other: -Provides group and verbal instruction on various topics (see comments)   Knowledge Questionnaire Score: Knowledge Questionnaire Score - 05/23/17 0821      Knowledge Questionnaire Score   Pre Score  14/28 Reviewed with patient       Core Components/Risk Factors/Patient Goals  at Admission: Personal Goals and Risk Factors at Admission - 05/21/17 1224      Core Components/Risk Factors/Patient Goals on Admission    Weight Management  Yes;Obesity;Weight Loss    Intervention  Weight Management: Develop a combined nutrition and exercise program designed to reach desired caloric intake, while maintaining appropriate intake of nutrient and fiber, sodium and fats, and appropriate energy expenditure required for the weight goal.;Weight Management: Provide education and appropriate resources to help participant work on and attain dietary goals.;Weight Management/Obesity: Establish reasonable short term and long term weight goals.    Admit Weight  223 lb 6.4 oz (101.3 kg)    Goal Weight: Short Term  218 lb (98.9 kg)    Goal Weight: Long Term  213 lb (96.6 kg)    Expected Outcomes  Short Term: Continue to assess and modify interventions until short term weight is achieved;Long Term: Adherence to nutrition and physical activity/exercise program aimed toward attainment of established weight goal;Weight Loss: Understanding of general recommendations for a balanced deficit meal plan, which promotes 1-2 lb weight loss per week and includes a negative  energy balance of [id number] kcal/d;Understanding recommendations for meals to include 15-35% energy as protein, 25-35% energy from fat, 35-60% energy from carbohydrates, less than 261m of dietary cholesterol, 20-35 gm of total fiber daily;Understanding of distribution of calorie intake throughout the day with the consumption of 4-5 meals/snacks    Diabetes  Yes    Intervention  Provide education about signs/symptoms and action to take for hypo/hyperglycemia.;Provide education about proper nutrition, including hydration, and aerobic/resistive exercise prescription along with prescribed medications to achieve blood glucose in normal ranges: Fasting glucose 65-99 mg/dL    Expected Outcomes  Short Term: Participant verbalizes understanding of the  signs/symptoms and immediate care of hyper/hypoglycemia, proper foot care and importance of medication, aerobic/resistive exercise and nutrition plan for blood glucose control.;Long Term: Attainment of HbA1C < 7%.    Heart Failure  Yes    Intervention  Provide a combined exercise and nutrition program that is supplemented with education, support and counseling about heart failure. Directed toward relieving symptoms such as shortness of breath, decreased exercise tolerance, and extremity edema.    Expected Outcomes  Improve functional capacity of life;Short term: Attendance in program 2-3 days a week with increased exercise capacity. Reported lower sodium intake. Reported increased fruit and vegetable intake. Reports medication compliance.;Short term: Daily weights obtained and reported for increase. Utilizing diuretic protocols set by physician.;Long term: Adoption of self-care skills and reduction of barriers for early signs and symptoms recognition and intervention leading to self-care maintenance.    Hypertension  Yes    Intervention  Provide education on lifestyle modifcations including regular physical activity/exercise, weight management, moderate sodium restriction and increased consumption of fresh fruit, vegetables, and low fat dairy, alcohol moderation, and smoking cessation.;Monitor prescription use compliance.    Expected Outcomes  Short Term: Continued assessment and intervention until BP is < 140/967mHG in hypertensive participants. < 130/8011mG in hypertensive participants with diabetes, heart failure or chronic kidney disease.;Long Term: Maintenance of blood pressure at goal levels.    Lipids  Yes    Intervention  Provide education and support for participant on nutrition & aerobic/resistive exercise along with prescribed medications to achieve LDL <61m46mDL >40mg65m Expected Outcomes  Short Term: Participant states understanding of desired cholesterol values and is compliant with  medications prescribed. Participant is following exercise prescription and nutrition guidelines.;Long Term: Cholesterol controlled with medications as prescribed, with individualized exercise RX and with personalized nutrition plan. Value goals: LDL < 61mg,46m > 40 mg.    Stress  Yes    Intervention  Offer individual and/or small group education and counseling on adjustment to heart disease, stress management and health-related lifestyle change. Teach and support self-help strategies.;Refer participants experiencing significant psychosocial distress to appropriate mental health specialists for further evaluation and treatment. When possible, include family members and significant others in education/counseling sessions.    Expected Outcomes  Short Term: Participant demonstrates changes in health-related behavior, relaxation and other stress management skills, ability to obtain effective social support, and compliance with psychotropic medications if prescribed.;Long Term: Emotional wellbeing is indicated by absence of clinically significant psychosocial distress or social isolation.       Core Components/Risk Factors/Patient Goals Review:  Goals and Risk Factor Review    Row Name 06/28/17 1348             Core Components/Risk Factors/Patient Goals Review   Personal Goals Review  Weight Management/Obesity;Heart Failure;Hypertension;Diabetes;Lipids       Review  CharleDeshoneen doing well in rehab.  His weight has been holding steady around 220 lbs.  He has not had any heart failure sysmtoms and continues to monitor his salt and fluid intake daily.  His blood pressure has been good, but he continues get lightheaded but not sure if could be his vertigo or something else.  He also continues to monitor his blood sugars daily.  He recently had a low of 89 fasting, which is the best it has been in a long time.         Expected Outcomes  Short: Continue to track weight and heart failure symptoms.  Long:  Continue to work on risk factor modifications.           Core Components/Risk Factors/Patient Goals at Discharge (Final Review):  Goals and Risk Factor Review - 06/28/17 1348      Core Components/Risk Factors/Patient Goals Review   Personal Goals Review  Weight Management/Obesity;Heart Failure;Hypertension;Diabetes;Lipids    Review  Srihith has been doing well in rehab.  His weight has been holding steady around 220 lbs.  He has not had any heart failure sysmtoms and continues to monitor his salt and fluid intake daily.  His blood pressure has been good, but he continues get lightheaded but not sure if could be his vertigo or something else.  He also continues to monitor his blood sugars daily.  He recently had a low of 89 fasting, which is the best it has been in a long time.      Expected Outcomes  Short: Continue to track weight and heart failure symptoms.  Long: Continue to work on risk factor modifications.        ITP Comments: ITP Comments    Row Name 05/21/17 1212 06/05/17 1040 06/06/17 0600 06/07/17 1249 06/14/17 0931   ITP Comments  Christopher Cummings moved from [place] recently to be closer to family. He has recently moved in with his sister and brother in law who have a 47 year old autistic son in a group home.  Christopher Cummings says he misses his 51 year old Grandchildren that he took care of every other weekend for his son and daughter in law. Malaquias admits that was his reason to quit drinking 13 years ago. He said he had his first heart attack 13 years ago about a month after he quit drinking a pint of liquor on Friday and a 5th or 2 on Saturday night. Christopher Cummings recently retired from working at [employer] for [employer] years and said he has been in and out of the hospital every since. Eliazer reports that he has never smoked. Kiano said he used to go to a large mall outside of [place] and sometimes walk 10 miles but is frustrated since he has still had heart problems including his recenlty  NSTEMI/PTCA.  Christopher Cummings said he doesn't eat like he should and isn't hungry much. He reports his blood sugars in the am are sometimes 250 but sometimes 120. Discussed maybe eating something since he takes long acting Metformin. The short acting Metformin is listed as an allergy since it causes diarrhea so much. Christopher Cummings carries his NTG sl and knows to discard it once it is opened per his MD he said he discards it after 6 months. He has a new bottle ready to start next week. Christopher Cummings given infor about [facility] and bus line in case his family can't drive him. Christopher Cummings said he really doesn't want to drive at times since he has vertigo at times.   Christopher Cummings recieved clearance  to return to rehab this morning.  He was able to exercise without any problems.   30 day review. Continue with ITP unless directed changes per Medical Director review.   Per ER report yesterday :Christopher Galan Sr. is a 67 y.o. male with a history of colitis, diverticulitis as well as diabetes and coronary artery disease on Plavix who is presenting to the emergency department today with one episode of what he describes as "coffee ground stool."  He says that he has had 7 episodes of diarrhea today.  No recent antibiotics but multiple recent hospitalizations over the past several weeks.  Says that he has some diffuse abdominal cramping which is intermittent.  No pain at this time.  No nausea or vomiting.  Just discharged recently for congestive heart failure.  Says that he was discharged on Ranexa and has been experiencing some intermittent episodes of chest pain that are relieved by nitroglycerin.  However, he has been unable to afford the Ranexa and so has not been taking it.  He does take Imdur, 30 mg daily.  From Emerg Dept note today: The patient was at cardiac rehab, when he had a sharp chest pain that went from the lateral axillary line around towards the midline on the left side.  He then developed a lightheaded sensation with severe nausea but no  vomiting.  He had associated cold and clammy feeling.  No palpitations, no syncope.  The patient states that his chest pain has now turned into a "dull ache."  He took one nitroglycerin without significant improvement.  He has not had any of his daily medications, including his aspirin.  The patient also reports that Friday, Saturday, and Sunday night, he was awoken from his sleep with chest pain and took 3 nitroglycerin overnight each night at approximately 2-hour intervals which improved his pain.  The patient was seen and evaluated here 12/5 for concern of "coffee-ground stool."  He was found to have guaiac negative testing and stable blood counts and was discharged home with a plan for outpatient GI follow-up.  The patient has not had any further black or tarry, or bloody stools.   Row Name 07/04/17 8337 07/12/17 1113 07/23/17 1648 07/31/17 0840 08/01/17 0611   ITP Comments  30 day review. Continue with ITP unless directed changes per Medical Director review.   Sheehan had trouble with vertigo today and could not exercise as much.  He said he had some chest pain that was anxiety.  He stayed for the length of session and got in some intermittent exercise.  He was not able to do resistance training today. He called his ride to also bring his medication with to pick him up.  After resting for a bit, he was able to walk out on his own to meet his ride.  Vin has been in the hospital for first degree heart block and NSTEMI.  He is home and feeling better.  He will need clearance in order to return to rehab.   Cortlan has been granted clearance to return to rehab after his appointment today.  Dr. Fletcher Anon said that he did not have another heart attack and it was just indegistion.  He is cleared to return without restricitions.  30 Day review. Continue with ITP unless directed changes per Medical Director review.       Comments:

## 2017-08-02 ENCOUNTER — Telehealth: Payer: Self-pay | Admitting: Cardiovascular Disease

## 2017-08-02 NOTE — Telephone Encounter (Signed)
S/w patient. States when he woke up his new bottle of Nitro had a pill missing. He's not sure if he woke up in the night with chest pain and took one or just took one without knowing. Suggested he work out a system with his sister and brother in law whom he lives with to avoid this and taking any other medications without knowing. Family in agreeable with plan. Advised if he continued to do things in his sleep he was unaware of then he should contact his PCP. Patient states he has felt completely fine today, no chest pain.

## 2017-08-02 NOTE — Telephone Encounter (Signed)
Pt calling stating concerned  He states this morning he woke up and his nitro (new bottle) had a pill missing  He states he is not sure if he woke up at night and took one and if he did if he had chest pain He is confused and would like a call back

## 2017-08-05 ENCOUNTER — Emergency Department
Admission: EM | Admit: 2017-08-05 | Discharge: 2017-08-06 | Disposition: A | Discharge status: Home / Self Care | Payer: BLUE CROSS/BLUE SHIELD | Attending: Emergency Medicine | Admitting: Emergency Medicine

## 2017-08-05 ENCOUNTER — Other Ambulatory Visit: Payer: Self-pay

## 2017-08-05 ENCOUNTER — Emergency Department: Payer: BLUE CROSS/BLUE SHIELD

## 2017-08-05 ENCOUNTER — Telehealth: Payer: Self-pay | Admitting: Cardiology

## 2017-08-05 DIAGNOSIS — Z7901 Long term (current) use of anticoagulants: Secondary | ICD-10-CM | POA: Insufficient documentation

## 2017-08-05 DIAGNOSIS — I110 Hypertensive heart disease with heart failure: Secondary | ICD-10-CM | POA: Insufficient documentation

## 2017-08-05 DIAGNOSIS — Z7982 Long term (current) use of aspirin: Secondary | ICD-10-CM | POA: Diagnosis not present

## 2017-08-05 DIAGNOSIS — I2510 Atherosclerotic heart disease of native coronary artery without angina pectoris: Secondary | ICD-10-CM | POA: Insufficient documentation

## 2017-08-05 DIAGNOSIS — E119 Type 2 diabetes mellitus without complications: Secondary | ICD-10-CM | POA: Diagnosis not present

## 2017-08-05 DIAGNOSIS — Z96652 Presence of left artificial knee joint: Secondary | ICD-10-CM | POA: Insufficient documentation

## 2017-08-05 DIAGNOSIS — Z7984 Long term (current) use of oral hypoglycemic drugs: Secondary | ICD-10-CM | POA: Diagnosis not present

## 2017-08-05 DIAGNOSIS — R197 Diarrhea, unspecified: Secondary | ICD-10-CM | POA: Diagnosis not present

## 2017-08-05 DIAGNOSIS — Z951 Presence of aortocoronary bypass graft: Secondary | ICD-10-CM | POA: Diagnosis not present

## 2017-08-05 DIAGNOSIS — R0789 Other chest pain: Secondary | ICD-10-CM

## 2017-08-05 DIAGNOSIS — R079 Chest pain, unspecified: Secondary | ICD-10-CM | POA: Diagnosis present

## 2017-08-05 DIAGNOSIS — I5022 Chronic systolic (congestive) heart failure: Secondary | ICD-10-CM | POA: Diagnosis not present

## 2017-08-05 DIAGNOSIS — Z955 Presence of coronary angioplasty implant and graft: Secondary | ICD-10-CM | POA: Insufficient documentation

## 2017-08-05 LAB — CBC
HCT: 41.8 % (ref 40.0–52.0)
Hemoglobin: 13.9 g/dL (ref 13.0–18.0)
MCH: 27.4 pg (ref 26.0–34.0)
MCHC: 33.2 g/dL (ref 32.0–36.0)
MCV: 82.5 fL (ref 80.0–100.0)
PLATELETS: 257 10*3/uL (ref 150–440)
RBC: 5.06 MIL/uL (ref 4.40–5.90)
RDW: 16.3 % — ABNORMAL HIGH (ref 11.5–14.5)
WBC: 10.6 10*3/uL (ref 3.8–10.6)

## 2017-08-05 LAB — BASIC METABOLIC PANEL
Anion gap: 10 (ref 5–15)
BUN: 18 mg/dL (ref 6–20)
CALCIUM: 9.5 mg/dL (ref 8.9–10.3)
CO2: 25 mmol/L (ref 22–32)
CREATININE: 0.83 mg/dL (ref 0.61–1.24)
Chloride: 101 mmol/L (ref 101–111)
Glucose, Bld: 246 mg/dL — ABNORMAL HIGH (ref 65–99)
Potassium: 3.8 mmol/L (ref 3.5–5.1)
SODIUM: 136 mmol/L (ref 135–145)

## 2017-08-05 LAB — HEPATIC FUNCTION PANEL
ALT: 18 U/L (ref 17–63)
AST: 24 U/L (ref 15–41)
Albumin: 4 g/dL (ref 3.5–5.0)
Alkaline Phosphatase: 92 U/L (ref 38–126)
BILIRUBIN DIRECT: 0.2 mg/dL (ref 0.1–0.5)
BILIRUBIN INDIRECT: 0.4 mg/dL (ref 0.3–0.9)
Total Bilirubin: 0.6 mg/dL (ref 0.3–1.2)
Total Protein: 7.7 g/dL (ref 6.5–8.1)

## 2017-08-05 LAB — TROPONIN I

## 2017-08-05 LAB — LIPASE, BLOOD: Lipase: 39 U/L (ref 11–51)

## 2017-08-05 MED ORDER — MORPHINE SULFATE (PF) 4 MG/ML IV SOLN
4.0000 mg | Freq: Once | INTRAVENOUS | Status: AC
  Administered 2017-08-05: 4 mg via INTRAVENOUS
  Filled 2017-08-05: qty 1

## 2017-08-05 MED ORDER — SODIUM CHLORIDE 0.9 % IV BOLUS (SEPSIS)
500.0000 mL | Freq: Once | INTRAVENOUS | Status: AC
  Administered 2017-08-05: 500 mL via INTRAVENOUS

## 2017-08-05 MED ORDER — SODIUM CHLORIDE 0.9 % IV BOLUS (SEPSIS): 1000.0000 mL | Freq: Once | INTRAVENOUS | Status: DC

## 2017-08-05 NOTE — ED Triage Notes (Signed)
Pt complains of central chest pain, diarrhea. Pt states diarrhea started at 1700 today. Pt states chest pain started at 1930. Pt states he took 2 ntg without relief. Pt appears uncomfortable.

## 2017-08-05 NOTE — ED Provider Notes (Signed)
[facility] Emergency Department Provider Note  ____________________________________________   I have reviewed the triage vital signs and the nursing notes. Where available I have reviewed prior notes and, if possible and indicated, outside hospital notes.    HISTORY  Chief Complaint Chest Pain and Diarrhea    HPI Christopher Lipson Sr. is a 67 y.o. male of CAD multiple MIs, EF of 35-40%, atrial fibrillation, aortic stenosis, who has a most recent cardiac cath in December 2018, about 6 weeks ago which showed, despite continual and ongoing angina pains, no change in his cardiac profile, presents today with diarrhea.  He had diarrhea for 5 times today.  Nonbloody non-melanotic, he has no abdominal pain.  He states that when he realized he was coming to the emergency department he began to have some chest discomfort.  He thinks it might have been the anxiety about coming to the department which caused it.  He did not vomit.  States the chest pain was a mild discomfort.  Was not exactly like his prior chest pain.  Also felt a little bit like a burning sensation.  He took nitro which did not help it.  Patient has pain of one variety or another with some great frequency he states.  Sometimes nitro helps and sometimes it does not.  He is unclear if this is similar to his prior angina. He has no abdominal pain, the chest pain is a dull discomfort which does not radiate, substernal/epigastric.  Nearly gone now.   Past Medical History:  Diagnosis Date  . Aortic stenosis    a. 07/2015 s/p bioprosthetic AVR (#23 [employer]) - [place]; b. 04/2017 Echo: nl fxn'ing AoV.  Marland Kitchen CAD (coronary artery disease)    a. 2009 PCI->D1; b. 07/2015 CABG x 2 reported (LIMA-LAD noted on cath 01/2017) [facility] - [place]; c. 2017/2018 Prox/Dist LCX & D1 stenting; d. 01/2017 PCI: ISR prox/dist LCX stents (PTCA), ISR D1 (med Rx); e. 04/2017 PCI: CBA for ISR- LCX 90p, 70d, D1 95; f. 05/2017 PCI: D1 99 ISR,  LCX 70p (2.25x22 Onyx DES); g. 06/2017 Cath: Patent LCX and RCA stents. D1 100 ISR->Med Rx.  . Chronic systolic CHF (congestive heart failure) ([place])    a. 04/2017 Echo: EF 35-40% with nl functioning bioprosthetic AoV, mild MR, nl PA.  . Colitis   . Diabetes mellitus with complication ([place])   . Diverticulitis   . Essential hypertension   . Hyperlipidemia   . Ischemic cardiomyopathy    a. 04/2017 Echo: EF 35-40%.  . Permanent atrial fibrillation ([place])    a. CHA2DS2VASc = 4-->No OAC 2/2 ongoing ASA/Plavix and h/o rectal bleeding.  . Rectal bleeding    a. 03/2017 -> f/u @ [facility].    Patient Active Problem List   Diagnosis Date Noted  . Back pain 07/20/2017  . Chest pain 07/20/2017  . Chest tightness 05/31/2017  . Gastroesophageal reflux disease 05/29/2017  . Chronic systolic heart failure ([place]) 05/12/2017  . H/O prosthetic aortic valve replacement 04/01/2017  . Unstable angina ([place]) 03/31/2017  . HTN (hypertension) 03/12/2017  . Diabetes ([place]) 03/12/2017  . CAD (coronary artery disease) 03/12/2017    Past Surgical History:  Procedure Laterality Date  . AORTIC VALVE REPLACEMENT    . CORONARY ARTERY BYPASS GRAFT    . CORONARY BALLOON ANGIOPLASTY N/A 04/02/2017   Procedure: CORONARY BALLOON ANGIOPLASTY;  Surgeon: Troy Sine, MD;  Location: [place] [facility];  Service: Cardiovascular;  Laterality: N/A;  . CORONARY  STENT INTERVENTION N/A 02/26/2017   Procedure: CORONARY STENT INTERVENTION;  Surgeon: Wellington Hampshire, MD;  Location: [place] [facility];  Service: Cardiovascular;  Laterality: N/A;  . CORONARY STENT INTERVENTION N/A 05/08/2017   Procedure: CORONARY STENT INTERVENTION;  Surgeon: Nelva Bush, MD;  Location: [place] [facility];  Service: Cardiovascular;  Laterality: N/A;  . CORONARY/GRAFT ANGIOGRAPHY N/A 02/26/2017   Procedure: CORONARY/GRAFT ANGIOGRAPHY;  Surgeon: Wellington Hampshire, MD;  Location: [place] [facility];  Service: Cardiovascular;  Laterality: N/A;   . CORONARY/GRAFT ANGIOGRAPHY N/A 04/02/2017   Procedure: CORONARY/GRAFT ANGIOGRAPHY;  Surgeon: Troy Sine, MD;  Location: [place] [facility];  Service: Cardiovascular;  Laterality: N/A;  . CORONARY/GRAFT ANGIOGRAPHY N/A 05/08/2017   Procedure: CORONARY/GRAFT ANGIOGRAPHY;  Surgeon: Nelva Bush, MD;  Location: [place] [facility];  Service: Cardiovascular;  Laterality: N/A;  . JOINT REPLACEMENT Left    KNEE  . KNEE ARTHROPLASTY  2000  . LEFT HEART CATH AND CORONARY ANGIOGRAPHY N/A 06/15/2017   Procedure: LEFT HEART CATH AND CORONARY ANGIOGRAPHY;  Surgeon: Wellington Hampshire, MD;  Location: [place] [facility];  Service: Cardiovascular;  Laterality: N/A;    Prior to Admission medications   Medication Sig Start Date End Date Taking? Authorizing Provider  acetaminophen (TYLENOL) 325 MG tablet Take 2 tablets (650 mg total) by mouth every 6 (six) hours as needed for mild pain (or Fever >/= 101). 04/26/17   Nicholes Mango, MD  alum & mag hydroxide-simeth (MAALOX/MYLANTA) 200-200-20 MG/5ML suspension Take 30 mLs by mouth every 6 (six) hours as needed for indigestion or heartburn. 07/20/17   Gladstone Lighter, MD  aspirin EC 81 MG tablet Take 1 tablet (81 mg total) by mouth daily. 06/13/17   Theora Gianotti, NP  atorvastatin (LIPITOR) 80 MG tablet Take 1 tablet (80 mg total) by mouth at bedtime. 04/30/17   [name], NP  blood glucose meter kit and supplies Dispense based on patient and insurance preference. Use 1 time daily as directed. (FOR ICD-9 250.00, 250.01). 04/30/17   [name], NP  Blood Pressure Monitoring (BLOOD PRESSURE CUFF) MISC 1 Units by Does not apply route daily. 04/30/17   [name], NP  clopidogrel (PLAVIX) 75 MG tablet Take 1 tablet (75 mg total) daily by mouth. 05/21/17   Dunn, Areta Haber, PA-C  cyclobenzaprine (FLEXERIL) 5 MG tablet Take 1 tablet (5 mg total) by mouth 3 (three) times daily as needed for muscle spasms. 07/20/17    Gladstone Lighter, MD  digoxin (LANOXIN) 0.125 MG tablet Take 0.5 tablets (0.0625 mg total) by mouth daily. 07/23/17 07/23/18  Rise Mu, PA-C  docusate sodium (COLACE) 100 MG capsule Take 1 capsule (100 mg total) by mouth 2 (two) times daily. 06/29/17   [name], NP  empagliflozin (JARDIANCE) 25 MG TABS tablet Take 25 mg daily by mouth. 05/15/17   [name], NP  isosorbide mononitrate (IMDUR) 60 MG 24 hr tablet Take 1 tablet (60 mg total) by mouth daily. 06/13/17 09/11/17  Theora Gianotti, NP  meclizine (ANTIVERT) 25 MG tablet Take 25 mg by mouth 3 (three) times daily as needed for dizziness.    [provider]  metFORMIN (GLUCOPHAGE-XR) 500 MG 24 hr tablet Take 1 tablet (500 mg total) daily with breakfast by mouth. 05/15/17 05/15/18  [name], NP  metoprolol succinate (TOPROL-XL) 50 MG 24 hr tablet Take one tablet (50 mg) daily if systolic blood pressure is >100 06/21/17   Theora Gianotti,  NP  nitroGLYCERIN (NITROSTAT) 0.4 MG SL tablet Place 1 tablet (0.4 mg total) under the tongue every 5 (five) minutes as needed for chest pain. 07/13/17   Wellington Hampshire, MD  ondansetron (ZOFRAN ODT) 4 MG disintegrating tablet Take 1 tablet (4 mg total) by mouth every 8 (eight) hours as needed for nausea or vomiting. 06/25/17   Rudene Re, MD  pantoprazole (PROTONIX) 40 MG tablet Take 1 tablet (40 mg total) daily by mouth. 05/21/17   Dunn, Areta Haber, PA-C  ranolazine (RANEXA) 1000 MG SR tablet Take 1 tablet (1,000 mg total) by mouth 2 (two) times daily. 07/10/17   Wellington Hampshire, MD  spironolactone (ALDACTONE) 25 MG tablet Take 0.5 tablets (12.5 mg total) daily by mouth. 05/21/17 08/19/17  Rise Mu, PA-C    Allergies Metformin and related  Family History  Problem Relation Age of Onset  . CAD Mother   . Heart failure Mother   . Lupus Mother   . CAD Brother   . Heart attack Brother   . Prostate cancer Father   . Diabetes Sister    . Healthy Paternal Grandmother   . Prostate cancer Paternal Grandfather   . Healthy Brother   . Healthy Sister     Social History Social History   Tobacco Use  . Smoking status: Never Smoker  . Smokeless tobacco: Never Used  Substance Use Topics  . Alcohol use: No    Comment: No Alcohol 12 years, but drank 2 "bottles of liquor" each week for several years prior to quitting  . Drug use: No    Review of Systems Constitutional: No fever/chills Eyes: No visual changes. ENT: No sore throat. No stiff neck no neck pain Cardiovascular: + chest pain. Respiratory: Denies shortness of breath. Gastrointestinal:   no vomiting.  No diarrhea.  No constipation. Genitourinary: Negative for dysuria. Musculoskeletal: Negative lower extremity swelling Skin: Negative for rash. Neurological: Negative for severe headaches, focal weakness or numbness.   ____________________________________________   PHYSICAL EXAM:  VITAL SIGNS: ED Triage Vitals [08/05/17 2027]  Enc Vitals Group     BP 98/67     Pulse Rate 84     Resp 16     Temp 97.8 F (36.6 C)     Temp Source Oral     SpO2 97 %     Weight 213 lb (96.6 kg)     Height 5' 10"  (1.778 m)     Head Circumference      Peak Flow      Pain Score 6     Pain Loc      Pain Edu?      Excl. in [place]?     Constitutional: Alert and oriented. Well appearing and in no acute distress. Eyes: Conjunctivae are normal Head: Atraumatic HEENT: No congestion/rhinnorhea. Mucous membranes are moist.  Oropharynx non-erythematous Neck:   Nontender with no meningismus, no masses, no stridor Cardiovascular: Normal rate, regular rhythm. Grossly normal heart sounds.  Good peripheral circulation. Respiratory: Normal respiratory effort.  No retractions. Lungs CTAB. Abdominal: Soft and nontender. No distention. No guarding no rebound Back:  There is no focal tenderness or step off.  there is no midline tenderness there are no lesions noted. there is no CVA  tenderness Musculoskeletal: No lower extremity tenderness, no upper extremity tenderness. No joint effusions, no DVT signs strong distal pulses no edema Neurologic:  Normal speech and language. No gross focal neurologic deficits are appreciated.  Skin:  Skin is warm, dry and  intact. No rash noted. Psychiatric: Mood and affect are anxious. Speech and behavior are normal.  ____________________________________________   LABS (all labs ordered are listed, but only abnormal results are displayed)  Labs Reviewed  BASIC METABOLIC PANEL - Abnormal; Notable for the following components:      Result Value   Glucose, Bld 246 (*)    All other components within normal limits  CBC - Abnormal; Notable for the following components:   RDW 16.3 (*)    All other components within normal limits  TROPONIN I  HEPATIC FUNCTION PANEL  LIPASE, BLOOD    Pertinent labs  results that were available during my care of the patient were reviewed by me and considered in my medical decision making (see chart for details). ____________________________________________  EKG  I personally interpreted any EKGs ordered by me or triage Atrial fibrillation rate 87 bpm, nonspecific ST changes noted, borderline ST elevation in lead III which is old.  No acute change noted from prior EKG ____________________________________________  RADIOLOGY  Pertinent labs & imaging results that were available during my care of the patient were reviewed by me and considered in my medical decision making (see chart for details). If possible, patient and/or family made aware of any abnormal findings.  Dg Chest 2 View  Result Date: 08/05/2017 CLINICAL DATA:  Central chest pain with diarrhea. EXAM: CHEST  2 VIEW COMPARISON:  07/18/2017. FINDINGS: The heart is enlarged. Prior median sternotomy for aortic valve replacement and CABG. Mild fibrotic change in the lung fields but no consolidation or edema. No effusion or pneumothorax. Bones  unremarkable IMPRESSION: No active cardiopulmonary disease.  Stable chest. Electronically Signed   By: Staci Righter M.D.   On: 08/05/2017 21:26   ____________________________________________    PROCEDURES  Procedure(s) performed: None  Procedures  Critical Care performed: None  ____________________________________________   INITIAL IMPRESSION / ASSESSMENT AND PLAN / ED COURSE  Pertinent labs & imaging results that were available during my care of the patient were reviewed by me and considered in my medical decision making (see chart for details).  Patient here with diarrhea and some atypical chest pain.  I did give him 4 morphine as much as an anxiolytic as a direct treatment of his chest pain as he had had nitro medially prior to coming in which did not help.  The nitro did lower his pressure which rapidly recovered.  Patient's blood work is quite reassuring he does normally have some degree of troponin elevation but does not have it today.  EKG does not to my read show any acute change, very low suspicion for ACS.  Although the patient is certainly at risk for it.  Serial abdominal exams are quite reassuring he has no abdominal tenderness.  He feels 100% better after a small fluid bolus, and he would like to go home.  He and I did extensively discuss that I cannot ensure he will be safe from a cardiac or other point of view if he does go home given his significant cardiac history admission is probably a safer option for him but he is adamant that he will go home.  He does not feel that he was having angina.  He denies any nausea vomiting or diarrhea since he has been here.  And he feels 100% better.  He and I have agreed that he will stay however for a repeat troponin, and if that is negative then he declines admission which has been offered and encouraged.  Thus far, at  this time, there does not appear to be clinical evidence to support the diagnosis of pulmonary embolus, dissection,  myocarditis, endocarditis, pericarditis, pericardial tamponade, acute coronary syndrome, pneumothorax, pneumonia, or any other acute intrathoracic pathology that will require admission or acute intervention. Nor is there evidence of any significant intra-abdominal pathology causing this discomfort.  Considering the patient's symptoms, medical history, and physical examination today, I have low suspicion for cholecystitis or biliary pathology, pancreatitis, perforation or bowel obstruction, hernia, intra-abdominal abscess, AAA or dissection, volvulus or intussusception, mesenteric ischemia, ischemic gut, pyelonephritis or appendicitis.  Signed out at the end of my shift to Dr. Dahlia Client who will reassess the patient prior to discharge after negative troponin if such is found.   ____________________________________________   FINAL CLINICAL IMPRESSION(S) / ED DIAGNOSES  Final diagnoses:  None      This chart was dictated using voice recognition software.  Despite best efforts to proofread,  errors can occur which can change meaning.      Schuyler Amor, MD 08/05/17 [phone]

## 2017-08-05 NOTE — Discharge Instructions (Signed)
return to the emergency room for any new or worrisome symptoms.  We have discussed and advised admission to the hospital you would prefer to go home.  This is certainly your choice, and in that context not unreasonable but it does limit our ability to continue to monitor you.  We are reassured by the findings we have seen so far.  If you have any new or worrisome symptoms including chest pain, shortness of breath, abdominal pain, bloody diarrhea, black stool, change your mind about further workup, fever or you feel worse in any way please return to the emergency department.

## 2017-08-05 NOTE — Telephone Encounter (Addendum)
[name] is a 67 year old gentleman with complex coronary artery disease and multiple recent admissions for chest pain with last LHC in December 2018. He called tonight due to recurrent chest pain. He reports that pain started 40 minutes prior to our phone call and has been constant. Pain is located in the central chest and is sharp in character. Not relieved by NG. No nausea, diaphoresis or shortness of breath. Reports that the pain is different from prior angina but has had multiple different characteristics of chest pain previously, including sharp central pain. Angiography in 06/2017 after recurrent CP showed patent RCA and LCx stents with total occlusion of the diagonal. Given recurrent chest pain, in a gentleman with a complex history of CAD, I advised the patient to come to the emergency department but to not drive himself. He noted understanding and will present to the emergency department tonight.

## 2017-08-06 ENCOUNTER — Other Ambulatory Visit: Payer: Self-pay

## 2017-08-06 ENCOUNTER — Emergency Department
Admission: EM | Admit: 2017-08-06 | Discharge: 2017-08-06 | Disposition: A | Discharge status: Home / Self Care | Payer: BLUE CROSS/BLUE SHIELD | Source: Home / Self Care | Attending: Emergency Medicine | Admitting: Emergency Medicine

## 2017-08-06 ENCOUNTER — Emergency Department: Payer: BLUE CROSS/BLUE SHIELD

## 2017-08-06 ENCOUNTER — Telehealth: Payer: Self-pay | Admitting: Cardiovascular Disease

## 2017-08-06 ENCOUNTER — Encounter: Payer: Self-pay | Admitting: Emergency Medicine

## 2017-08-06 ENCOUNTER — Ambulatory Visit: Payer: BLUE CROSS/BLUE SHIELD | Admitting: Podiatry

## 2017-08-06 DIAGNOSIS — I110 Hypertensive heart disease with heart failure: Secondary | ICD-10-CM | POA: Insufficient documentation

## 2017-08-06 DIAGNOSIS — I5022 Chronic systolic (congestive) heart failure: Secondary | ICD-10-CM | POA: Insufficient documentation

## 2017-08-06 DIAGNOSIS — Y999 Unspecified external cause status: Secondary | ICD-10-CM

## 2017-08-06 DIAGNOSIS — Z955 Presence of coronary angioplasty implant and graft: Secondary | ICD-10-CM

## 2017-08-06 DIAGNOSIS — W0110XA Fall on same level from slipping, tripping and stumbling with subsequent striking against unspecified object, initial encounter: Secondary | ICD-10-CM

## 2017-08-06 DIAGNOSIS — Z7982 Long term (current) use of aspirin: Secondary | ICD-10-CM

## 2017-08-06 DIAGNOSIS — I2510 Atherosclerotic heart disease of native coronary artery without angina pectoris: Secondary | ICD-10-CM | POA: Insufficient documentation

## 2017-08-06 DIAGNOSIS — Z79899 Other long term (current) drug therapy: Secondary | ICD-10-CM

## 2017-08-06 DIAGNOSIS — Z7901 Long term (current) use of anticoagulants: Secondary | ICD-10-CM

## 2017-08-06 DIAGNOSIS — Z951 Presence of aortocoronary bypass graft: Secondary | ICD-10-CM

## 2017-08-06 DIAGNOSIS — E119 Type 2 diabetes mellitus without complications: Secondary | ICD-10-CM | POA: Insufficient documentation

## 2017-08-06 DIAGNOSIS — Y92019 Unspecified place in single-family (private) house as the place of occurrence of the external cause: Secondary | ICD-10-CM | POA: Insufficient documentation

## 2017-08-06 DIAGNOSIS — Y9301 Activity, walking, marching and hiking: Secondary | ICD-10-CM

## 2017-08-06 DIAGNOSIS — S20211A Contusion of right front wall of thorax, initial encounter: Secondary | ICD-10-CM

## 2017-08-06 DIAGNOSIS — W19XXXA Unspecified fall, initial encounter: Secondary | ICD-10-CM

## 2017-08-06 LAB — BASIC METABOLIC PANEL
Anion gap: 10 (ref 5–15)
BUN: 16 mg/dL (ref 6–20)
CHLORIDE: 101 mmol/L (ref 101–111)
CO2: 23 mmol/L (ref 22–32)
Calcium: 9.1 mg/dL (ref 8.9–10.3)
Creatinine, Ser: 0.96 mg/dL (ref 0.61–1.24)
GFR calc Af Amer: >60 mL/min (ref >60)
GFR calc non Af Amer: >60 mL/min (ref >60)
Glucose, Bld: 340 mg/dL — ABNORMAL HIGH (ref 65–99)
Potassium: 3.4 mmol/L — ABNORMAL LOW (ref 3.5–5.1)
SODIUM: 134 mmol/L — AB (ref 135–145)

## 2017-08-06 LAB — CBC
HCT: 41.1 % (ref 40.0–52.0)
Hemoglobin: 13.5 g/dL (ref 13.0–18.0)
MCH: 27.4 pg (ref 26.0–34.0)
MCHC: 32.9 g/dL (ref 32.0–36.0)
MCV: 83.3 fL (ref 80.0–100.0)
Platelets: 240 10*3/uL (ref 150–440)
RBC: 4.93 MIL/uL (ref 4.40–5.90)
RDW: 16.3 % — AB (ref 11.5–14.5)
WBC: 9.4 10*3/uL (ref 3.8–10.6)

## 2017-08-06 LAB — TROPONIN I
Troponin I: 0.04 ng/mL (ref <0.03)
Troponin I: <0.03 ng/mL (ref <0.03)

## 2017-08-06 MED ORDER — PREDNISONE 50 MG PO TABS: 50.0000 mg | ORAL_TABLET | Freq: Every day | ORAL | 0 refills | Status: AC

## 2017-08-06 MED ORDER — HYDROCODONE-ACETAMINOPHEN 5-325 MG PO TABS: 1.0000 | ORAL_TABLET | ORAL | 0 refills | Status: DC | PRN

## 2017-08-06 NOTE — Telephone Encounter (Signed)
Lm with Pt brother for patient to call back and schedule an appointment   Pt was seen in ED on 08/05/17 for CP  He is a patient of Dr Kirke CorinArida   Will need to be called again

## 2017-08-06 NOTE — ED Notes (Signed)
Pt states if we check his heart enzymes it is normal for them to be come back elevated.

## 2017-08-06 NOTE — ED Triage Notes (Addendum)
Pt presents to ED after he suffered a mechanical fall while walking into his house. Pt states he tripped on a brick and landed on his chest on the hardwood floors. Redness noted to the right side and center of his chest. Area is tender to touch. Denies sob. Worse when taking a breath. Incident occurred around 1600

## 2017-08-06 NOTE — Telephone Encounter (Signed)
Patient not home. Left message with family member to have patient call us back when able.

## 2017-08-06 NOTE — ED Notes (Signed)
Reviewed discharge instructions and follow-up care, with patient. Patient verbalized understanding of all information reviewed. Patient stable, with no distress noted at this time.

## 2017-08-06 NOTE — Telephone Encounter (Signed)
Pt is scheduled to see Christopher Cummings on 08/21/17  He is wanting to know if it is okay for him to go back to Physical Therapy   Please advise

## 2017-08-06 NOTE — ED Provider Notes (Signed)
[facility] Emergency Department Provider Note  ____________________________________________  Time seen: Approximately 9:33 PM  I have reviewed the triage vital signs and the nursing notes.   HISTORY  Chief Complaint Fall and Chest Pain    HPI Christopher Asper Sr. is a 67 y.o. male who presents emergency department complaining of right anterior chest wall pain status post a fall.  Patient reports that he was at home, tripped entering his house and fell landing on his chest wall.  Patient reports initially he had.  Throughout the day he became more tender throughout the right anterior chest wall.  Patient denies hitting his head or lose consciousness.  He reports that he has had no shortness of breath associated with same.  He reports that pain is reproducible with movement or palpation.  Patient has a history of coronary artery disease, aortic stenosis, CHF, diabetes, hypertension, STEMI and non-STEMI.  Patient denies any complaints with these ongoing problems.  Patient reports that his chest pain is significantly different than his normal cardiac chest pain.  Patient is not taking medication for this complaint prior to arrival.  Patient denies any other complaints at this time.  Past Medical History:  Diagnosis Date  . Aortic stenosis    a. 07/2015 s/p bioprosthetic AVR (#23 [employer]) - [place]; b. 04/2017 Echo: nl fxn'ing AoV.  Marland Kitchen CAD (coronary artery disease)    a. 2009 PCI->D1; b. 07/2015 CABG x 2 reported (LIMA-LAD noted on cath 01/2017) [facility] - [place]; c. 2017/2018 Prox/Dist LCX & D1 stenting; d. 01/2017 PCI: ISR prox/dist LCX stents (PTCA), ISR D1 (med Rx); e. 04/2017 PCI: CBA for ISR- LCX 90p, 70d, D1 95; f. 05/2017 PCI: D1 99 ISR, LCX 70p (2.25x22 Onyx DES); g. 06/2017 Cath: Patent LCX and RCA stents. D1 100 ISR->Med Rx.  . Chronic systolic CHF (congestive heart failure) ([place])    a. 04/2017 Echo: EF 35-40% with nl functioning bioprosthetic AoV, mild MR,  nl PA.  . Colitis   . Diabetes mellitus with complication ([place])   . Diverticulitis   . Essential hypertension   . Hyperlipidemia   . Ischemic cardiomyopathy    a. 04/2017 Echo: EF 35-40%.  . Permanent atrial fibrillation ([place])    a. CHA2DS2VASc = 4-->No OAC 2/2 ongoing ASA/Plavix and h/o rectal bleeding.  . Rectal bleeding    a. 03/2017 -> f/u @ [facility].    Patient Active Problem List   Diagnosis Date Noted  . Back pain 07/20/2017  . Chest pain 07/20/2017  . Chest tightness 05/31/2017  . Gastroesophageal reflux disease 05/29/2017  . Chronic systolic heart failure ([place]) 05/12/2017  . H/O prosthetic aortic valve replacement 04/01/2017  . Unstable angina ([place]) 03/31/2017  . HTN (hypertension) 03/12/2017  . Diabetes ([place]) 03/12/2017  . CAD (coronary artery disease) 03/12/2017    Past Surgical History:  Procedure Laterality Date  . AORTIC VALVE REPLACEMENT    . CORONARY ARTERY BYPASS GRAFT    . CORONARY BALLOON ANGIOPLASTY N/A 04/02/2017   Procedure: CORONARY BALLOON ANGIOPLASTY;  Surgeon: Troy Sine, MD;  Location: [place] [facility];  Service: Cardiovascular;  Laterality: N/A;  . CORONARY STENT INTERVENTION N/A 02/26/2017   Procedure: CORONARY STENT INTERVENTION;  Surgeon: Wellington Hampshire, MD;  Location: [place] [facility];  Service: Cardiovascular;  Laterality: N/A;  . CORONARY STENT INTERVENTION N/A 05/08/2017   Procedure: CORONARY STENT INTERVENTION;  Surgeon: Nelva Bush, MD;  Location: [place] [facility];  Service: Cardiovascular;  Laterality:  N/A;  . Remus Blake ANGIOGRAPHY N/A 02/26/2017   Procedure: CORONARY/GRAFT ANGIOGRAPHY;  Surgeon: Wellington Hampshire, MD;  Location: [place] [facility];  Service: Cardiovascular;  Laterality: N/A;  . CORONARY/GRAFT ANGIOGRAPHY N/A 04/02/2017   Procedure: CORONARY/GRAFT ANGIOGRAPHY;  Surgeon: Troy Sine, MD;  Location: [place] [facility];  Service: Cardiovascular;  Laterality: N/A;  . CORONARY/GRAFT ANGIOGRAPHY N/A  05/08/2017   Procedure: CORONARY/GRAFT ANGIOGRAPHY;  Surgeon: Nelva Bush, MD;  Location: [place] [facility];  Service: Cardiovascular;  Laterality: N/A;  . JOINT REPLACEMENT Left    KNEE  . KNEE ARTHROPLASTY  2000  . LEFT HEART CATH AND CORONARY ANGIOGRAPHY N/A 06/15/2017   Procedure: LEFT HEART CATH AND CORONARY ANGIOGRAPHY;  Surgeon: Wellington Hampshire, MD;  Location: [place] [facility];  Service: Cardiovascular;  Laterality: N/A;    Prior to Admission medications   Medication Sig Start Date End Date Taking? Authorizing Provider  acetaminophen (TYLENOL) 325 MG tablet Take 2 tablets (650 mg total) by mouth every 6 (six) hours as needed for mild pain (or Fever >/= 101). 04/26/17   Nicholes Mango, MD  alum & mag hydroxide-simeth (MAALOX/MYLANTA) 200-200-20 MG/5ML suspension Take 30 mLs by mouth every 6 (six) hours as needed for indigestion or heartburn. 07/20/17   Gladstone Lighter, MD  aspirin EC 81 MG tablet Take 1 tablet (81 mg total) by mouth daily. 06/13/17   Theora Gianotti, NP  atorvastatin (LIPITOR) 80 MG tablet Take 1 tablet (80 mg total) by mouth at bedtime. 04/30/17   [name], NP  blood glucose meter kit and supplies Dispense based on patient and insurance preference. Use 1 time daily as directed. (FOR ICD-9 250.00, 250.01). 04/30/17   [name], NP  Blood Pressure Monitoring (BLOOD PRESSURE CUFF) MISC 1 Units by Does not apply route daily. 04/30/17   [name], NP  clopidogrel (PLAVIX) 75 MG tablet Take 1 tablet (75 mg total) daily by mouth. 05/21/17   Dunn, Areta Haber, PA-C  cyclobenzaprine (FLEXERIL) 5 MG tablet Take 1 tablet (5 mg total) by mouth 3 (three) times daily as needed for muscle spasms. 07/20/17   Gladstone Lighter, MD  digoxin (LANOXIN) 0.125 MG tablet Take 0.5 tablets (0.0625 mg total) by mouth daily. 07/23/17 07/23/18  Rise Mu, PA-C  docusate sodium (COLACE) 100 MG capsule Take 1 capsule (100 mg total) by mouth 2  (two) times daily. 06/29/17   [name], NP  empagliflozin (JARDIANCE) 25 MG TABS tablet Take 25 mg daily by mouth. 05/15/17   [name], NP  HYDROcodone-acetaminophen (NORCO/VICODIN) 5-325 MG tablet Take 1 tablet by mouth every 4 (four) hours as needed for moderate pain. 08/06/17   Cheyann Blecha, Charline Bills, PA-C  isosorbide mononitrate (IMDUR) 60 MG 24 hr tablet Take 1 tablet (60 mg total) by mouth daily. 06/13/17 09/11/17  Theora Gianotti, NP  meclizine (ANTIVERT) 25 MG tablet Take 25 mg by mouth 3 (three) times daily as needed for dizziness.    [provider]  metFORMIN (GLUCOPHAGE-XR) 500 MG 24 hr tablet Take 1 tablet (500 mg total) daily with breakfast by mouth. 05/15/17 05/15/18  [name], NP  metoprolol succinate (TOPROL-XL) 50 MG 24 hr tablet Take one tablet (50 mg) daily if systolic blood pressure is >100 06/21/17   Theora Gianotti, NP  nitroGLYCERIN (NITROSTAT) 0.4 MG SL tablet Place 1 tablet (0.4 mg total) under the tongue every 5 (five) minutes as needed for chest pain. 07/13/17   Arida,  Mertie Clause, MD  ondansetron (ZOFRAN ODT) 4 MG disintegrating tablet Take 1 tablet (4 mg total) by mouth every 8 (eight) hours as needed for nausea or vomiting. 06/25/17   Rudene Re, MD  pantoprazole (PROTONIX) 40 MG tablet Take 1 tablet (40 mg total) daily by mouth. 05/21/17   Dunn, Areta Haber, PA-C  predniSONE (DELTASONE) 50 MG tablet Take 1 tablet (50 mg total) by mouth daily with breakfast. 08/06/17   Autum Benfer, Charline Bills, PA-C  ranolazine (RANEXA) 1000 MG SR tablet Take 1 tablet (1,000 mg total) by mouth 2 (two) times daily. 07/10/17   Wellington Hampshire, MD  spironolactone (ALDACTONE) 25 MG tablet Take 0.5 tablets (12.5 mg total) daily by mouth. 05/21/17 08/19/17  Rise Mu, PA-C    Allergies Metformin and related  Family History  Problem Relation Age of Onset  . CAD Mother   . Heart failure Mother   . Lupus Mother   . CAD Brother    . Heart attack Brother   . Prostate cancer Father   . Diabetes Sister   . Healthy Paternal Grandmother   . Prostate cancer Paternal Grandfather   . Healthy Brother   . Healthy Sister     Social History Social History   Tobacco Use  . Smoking status: Never Smoker  . Smokeless tobacco: Never Used  Substance Use Topics  . Alcohol use: No    Comment: No Alcohol 12 years, but drank 2 "bottles of liquor" each week for several years prior to quitting  . Drug use: No     Review of Systems  Constitutional: No fever/chills Eyes: No visual changes. No discharge ENT: No upper respiratory complaints. Cardiovascular: no chest pain. Respiratory: no cough. No SOB. Gastrointestinal: No abdominal pain.  No nausea, no vomiting.  No diarrhea.  No constipation. Musculoskeletal: Positve for right upper anterior chest wall pain. Skin: Negative for rash, abrasions, lacerations, ecchymosis. Neurological: Negative for headaches, focal weakness or numbness. 10-point ROS otherwise negative.  ____________________________________________   PHYSICAL EXAM:  VITAL SIGNS: ED Triage Vitals  Enc Vitals Group     BP 08/06/17 1920 (!) 112/56     Pulse Rate 08/06/17 1920 78     Resp 08/06/17 1920 18     Temp 08/06/17 1920 98 F (36.7 C)     Temp Source 08/06/17 1920 Oral     SpO2 08/06/17 1920 94 %     Weight 08/06/17 1918 213 lb (96.6 kg)     Height 08/06/17 1918 5' 10"  (1.778 m)     Head Circumference --      Peak Flow --      Pain Score 08/06/17 1917 9     Pain Loc --      Pain Edu? --      Excl. in [place]? --      Constitutional: Alert and oriented. Well appearing and in no acute distress. Eyes: Conjunctivae are normal. PERRL. EOMI. Head: Atraumatic. ENT:      Ears:       Nose: No congestion/rhinnorhea.      Mouth/Throat: Mucous membranes are moist.  Neck: No stridor.  No cervical spine tenderness to palpation.  Cardiovascular: Normal rate, regular rhythm. Normal S1 and S2.  No  murmurs, rubs, gallops.  Good peripheral circulation. Respiratory: Normal respiratory effort without tachypnea or retractions. Lungs CTAB. Good air entry to the bases with no decreased or absent breath sounds. Gastrointestinal: Bowel sounds 4 quadrants. Soft and nontender to palpation. No guarding or rigidity.  No palpable masses. No distention.  Musculoskeletal: Full range of motion to all extremities. No gross deformities appreciated.  Visible deformity to right anterior chest wall on inspection.  Equal chest rise and fall.  Patient is diffusely tender to palpation from ribs 3 through 10.  No palpable abnormality.  No paradoxical chest wall movement.  No flail segment.  Good underlying breath sounds bilaterally. Neurologic:  Normal speech and language. No gross focal neurologic deficits are appreciated.  Skin:  Skin is warm, dry and intact. No rash noted. Psychiatric: Mood and affect are normal. Speech and behavior are normal. Patient exhibits appropriate insight and judgement.   ____________________________________________   LABS (all labs ordered are listed, but only abnormal results are displayed)  Labs Reviewed  BASIC METABOLIC PANEL - Abnormal; Notable for the following components:      Result Value   Sodium 134 (*)    Potassium 3.4 (*)    Glucose, Bld 340 (*)    All other components within normal limits  CBC - Abnormal; Notable for the following components:   RDW 16.3 (*)    All other components within normal limits  TROPONIN I - Abnormal; Notable for the following components:   Troponin I 0.04 (*)    All other components within normal limits   ____________________________________________  EKG  ED ECG REPORT I, Charline Bills Juleen Sorrels,  personally viewed and interpreted this ECG.   Date: 08/06/2017  EKG Time: 1916 hrs.  Rate: 77 bpm  Rhythm: normal sinus rhythm, incomplete right bundle branch block  Axis: Normal axis  Intervals:Incomplete right bundle branch block  ST&T  Change: No ST elevations or depressions noted.  PR, QRS, QT interval within normal limits.  When compared with previous EKG, patient is now in normal sinus rhythm compared to A. fib.  Visualization of incomplete right bundle branch block which was not there previously.   ____________________________________________  RADIOLOGY Diamantina Providence Aram Domzalski, personally viewed and evaluated these images (plain radiographs) as part of my medical decision making, as well as reviewing the written report by the radiologist.  Dg Chest 2 View  Result Date: 08/06/2017 CLINICAL DATA:  Pain following fall EXAM: CHEST  2 VIEW COMPARISON:  August 05, 2017 FINDINGS: There is scarring in the left base region. There is no edema or consolidation. Heart is upper normal in size with pulmonary vascularity within normal limits. No adenopathy. There is an aortic valve replacement. Patient is status post internal mammary bypass grafting. No pneumothorax. There is degenerative change in the thoracic spine. IMPRESSION: Scarring left base. No edema or consolidation. Postoperative changes. No pneumothorax. Electronically Signed   By: Lowella Grip III M.D.   On: 08/06/2017 19:33    ____________________________________________    PROCEDURES  Procedure(s) performed:    Procedures    Medications - No data to display   ____________________________________________   INITIAL IMPRESSION / ASSESSMENT AND PLAN / ED COURSE  Pertinent labs & imaging results that were available during my care of the patient were reviewed by me and considered in my medical decision making (see chart for details).  Review of the [place] CSRS was performed in accordance of the [place] prior to dispensing any controlled drugs.  Clinical Course as of Aug 06 2226  Mon Aug 06, 2017  2211 She presented to the emergency department complaining of right anterior chest wall pain status post a mechanical fall.  Patient with reassuring exam.  Chest x-ray  reveals no osseous abnormality.  Patient's labs returned with elevated  troponin.  EKG reveals no indication of STEMI.  At this time, I feel that troponin is likely elevated due to the trauma, however I will consult cardiology for their recommendation on patient disposition.  [JC]  2211 I discussed the patient's case with cardiologist, Dr. Fletcher Anon, who is in fact the patient's cardiologist.  He reports that patient slightly elevated troponin in the setting of trauma as well as when considering the patient's baseline is not of concern at this time.  I feel that the patient would be suitable for discharge and cardiologist agrees.  [JC]    Clinical Course User Index [JC] Darrion Macaulay, Charline Bills, PA-C    Patient's diagnosis is consistent with fall with contusion of the right anterior chest wall.  Patient's exam is reassuring.  EKG and chest x-ray was reassuring.  Patient did have an elevated troponin.  I discussed the patient with his cardiologist who agrees the patient at this time would be suitable for discharge.  No further workup at this time.. Patient will be discharged home with prescriptions for #8 of Norco and 5 days of prednisone for symptom relief. Patient is to follow up with primary care or cardiology as needed or otherwise directed. Patient is given ED precautions to return to the ED for any worsening or new symptoms.     ____________________________________________  FINAL CLINICAL IMPRESSION(S) / ED DIAGNOSES  Final diagnoses:  Fall, initial encounter  Contusion of right chest wall, initial encounter      NEW MEDICATIONS STARTED DURING THIS VISIT:  ED Discharge Orders        Ordered    HYDROcodone-acetaminophen (NORCO/VICODIN) 5-325 MG tablet  Every 4 hours PRN     08/06/17 2227    predniSONE (DELTASONE) 50 MG tablet  Daily with breakfast     08/06/17 2227          This chart was dictated using voice recognition software/Dragon. Despite best efforts to proofread, errors  can occur which can change the meaning. Any change was purely unintentional.    Darletta Moll, PA-C 08/06/17 2228    Delman Kitten, MD 08/07/17 [phone]

## 2017-08-06 NOTE — ED Notes (Signed)
Pt states that he tripped walking into the house and tripped on a step and fell face first hitting his chest - c/o right sided chest pain with inspiration - he c/o generalized aching all over

## 2017-08-06 NOTE — ED Provider Notes (Signed)
-----------------------------------------   1:43 AM on 08/06/2017 -----------------------------------------   Blood pressure 123/68, pulse 81, temperature 97.8 F (36.6 C), temperature source Oral, resp. rate 13, height 5\' 10"  (1.778 m), weight 96.6 kg (213 lb), SpO2 97 %.  Assuming care from Dr. Alphonzo LemmingsMcShane.  In short, Christopher ElseCharles Luthi Sr. is a 67 y.o. male with a chief complaint of Chest Pain and Diarrhea .  Refer to the original H&P for additional details.  The current plan of care is to follow up the repeat troponin.     The patient's repeat troponin was negative.  I went into reassess the patient and his chest pain was gone.  He states that he would consider coming in just because he is tired and did not want to have to drive home but I did inform him that since his troponin was negative and his chest pain is resolved it would probably be better for him to follow-up with his cardiologist in the morning.  The patient will be discharged to follow-up with his cardiologist.   [name], [name], MD 08/06/17 [phone]

## 2017-08-07 ENCOUNTER — Encounter: Payer: BLUE CROSS/BLUE SHIELD | Attending: Cardiovascular Disease

## 2017-08-07 ENCOUNTER — Encounter: Payer: Self-pay | Admitting: *Deleted

## 2017-08-07 ENCOUNTER — Telehealth: Payer: Self-pay | Admitting: *Deleted

## 2017-08-07 DIAGNOSIS — I252 Old myocardial infarction: Secondary | ICD-10-CM | POA: Insufficient documentation

## 2017-08-07 DIAGNOSIS — E785 Hyperlipidemia, unspecified: Secondary | ICD-10-CM | POA: Insufficient documentation

## 2017-08-07 DIAGNOSIS — I214 Non-ST elevation (NSTEMI) myocardial infarction: Secondary | ICD-10-CM

## 2017-08-07 DIAGNOSIS — Z7984 Long term (current) use of oral hypoglycemic drugs: Secondary | ICD-10-CM | POA: Insufficient documentation

## 2017-08-07 DIAGNOSIS — Z7902 Long term (current) use of antithrombotics/antiplatelets: Secondary | ICD-10-CM | POA: Insufficient documentation

## 2017-08-07 DIAGNOSIS — Z7982 Long term (current) use of aspirin: Secondary | ICD-10-CM | POA: Insufficient documentation

## 2017-08-07 DIAGNOSIS — Z79899 Other long term (current) drug therapy: Secondary | ICD-10-CM | POA: Insufficient documentation

## 2017-08-07 DIAGNOSIS — I2510 Atherosclerotic heart disease of native coronary artery without angina pectoris: Secondary | ICD-10-CM | POA: Insufficient documentation

## 2017-08-07 DIAGNOSIS — E119 Type 2 diabetes mellitus without complications: Secondary | ICD-10-CM | POA: Insufficient documentation

## 2017-08-07 DIAGNOSIS — I10 Essential (primary) hypertension: Secondary | ICD-10-CM | POA: Insufficient documentation

## 2017-08-07 DIAGNOSIS — Z9861 Coronary angioplasty status: Secondary | ICD-10-CM

## 2017-08-07 DIAGNOSIS — Z955 Presence of coronary angioplasty implant and graft: Secondary | ICD-10-CM | POA: Insufficient documentation

## 2017-08-07 NOTE — Telephone Encounter (Signed)
Called to check on status of return.  Christopher Cummings was cleared to return by Dr. Kirke CorinArida last week, but has not returned.  Upon chart review, he present to the ED after a fall at home with a chest contusion.  Unable to leave message at home or on mobile at this time.

## 2017-08-08 ENCOUNTER — Other Ambulatory Visit: Payer: Self-pay | Admitting: *Deleted

## 2017-08-08 MED ORDER — DIGOXIN 125 MCG PO TABS: 0.0625 mg | ORAL_TABLET | Freq: Every day | ORAL | 3 refills | Status: DC

## 2017-08-08 NOTE — Telephone Encounter (Signed)
Christopher Cummings called back.  He has the flu and soreness from his fall.  He is hoping to return on Tuesday.

## 2017-08-17 ENCOUNTER — Ambulatory Visit: Payer: BLUE CROSS/BLUE SHIELD | Admitting: Cardiovascular Disease

## 2017-08-21 ENCOUNTER — Ambulatory Visit: Payer: BLUE CROSS/BLUE SHIELD | Admitting: Nurse Practitioner

## 2017-08-21 ENCOUNTER — Other Ambulatory Visit: Payer: Self-pay

## 2017-08-21 ENCOUNTER — Inpatient Hospital Stay
Admission: EM | Admit: 2017-08-21 | Discharge: 2017-08-23 | DRG: 281 | Disposition: A | Discharge status: Home / Self Care | Payer: BLUE CROSS/BLUE SHIELD | Attending: Internal Medicine | Admitting: Internal Medicine

## 2017-08-21 ENCOUNTER — Encounter: Payer: Self-pay | Admitting: Medical Oncology

## 2017-08-21 ENCOUNTER — Emergency Department: Payer: BLUE CROSS/BLUE SHIELD

## 2017-08-21 DIAGNOSIS — I214 Non-ST elevation (NSTEMI) myocardial infarction: Secondary | ICD-10-CM | POA: Diagnosis not present

## 2017-08-21 DIAGNOSIS — Z7982 Long term (current) use of aspirin: Secondary | ICD-10-CM

## 2017-08-21 DIAGNOSIS — E785 Hyperlipidemia, unspecified: Secondary | ICD-10-CM | POA: Diagnosis not present

## 2017-08-21 DIAGNOSIS — I5022 Chronic systolic (congestive) heart failure: Secondary | ICD-10-CM

## 2017-08-21 DIAGNOSIS — Z955 Presence of coronary angioplasty implant and graft: Secondary | ICD-10-CM | POA: Diagnosis present

## 2017-08-21 DIAGNOSIS — Z7984 Long term (current) use of oral hypoglycemic drugs: Secondary | ICD-10-CM

## 2017-08-21 DIAGNOSIS — Z8249 Family history of ischemic heart disease and other diseases of the circulatory system: Secondary | ICD-10-CM

## 2017-08-21 DIAGNOSIS — R739 Hyperglycemia, unspecified: Secondary | ICD-10-CM

## 2017-08-21 DIAGNOSIS — F419 Anxiety disorder, unspecified: Secondary | ICD-10-CM | POA: Diagnosis present

## 2017-08-21 DIAGNOSIS — I480 Paroxysmal atrial fibrillation: Secondary | ICD-10-CM | POA: Diagnosis present

## 2017-08-21 DIAGNOSIS — R079 Chest pain, unspecified: Secondary | ICD-10-CM | POA: Diagnosis present

## 2017-08-21 DIAGNOSIS — I25110 Atherosclerotic heart disease of native coronary artery with unstable angina pectoris: Secondary | ICD-10-CM | POA: Diagnosis present

## 2017-08-21 DIAGNOSIS — I10 Essential (primary) hypertension: Secondary | ICD-10-CM | POA: Diagnosis not present

## 2017-08-21 DIAGNOSIS — I255 Ischemic cardiomyopathy: Secondary | ICD-10-CM

## 2017-08-21 DIAGNOSIS — Z7902 Long term (current) use of antithrombotics/antiplatelets: Secondary | ICD-10-CM | POA: Diagnosis not present

## 2017-08-21 DIAGNOSIS — E1165 Type 2 diabetes mellitus with hyperglycemia: Secondary | ICD-10-CM | POA: Diagnosis present

## 2017-08-21 DIAGNOSIS — Z79899 Other long term (current) drug therapy: Secondary | ICD-10-CM

## 2017-08-21 DIAGNOSIS — Z9861 Coronary angioplasty status: Secondary | ICD-10-CM

## 2017-08-21 DIAGNOSIS — I110 Hypertensive heart disease with heart failure: Secondary | ICD-10-CM | POA: Diagnosis present

## 2017-08-21 DIAGNOSIS — T82855A Stenosis of coronary artery stent, initial encounter: Secondary | ICD-10-CM | POA: Diagnosis present

## 2017-08-21 DIAGNOSIS — Y831 Surgical operation with implant of artificial internal device as the cause of abnormal reaction of the patient, or of later complication, without mention of misadventure at the time of the procedure: Secondary | ICD-10-CM | POA: Diagnosis present

## 2017-08-21 DIAGNOSIS — Z96652 Presence of left artificial knee joint: Secondary | ICD-10-CM | POA: Diagnosis present

## 2017-08-21 DIAGNOSIS — Z7952 Long term (current) use of systemic steroids: Secondary | ICD-10-CM

## 2017-08-21 DIAGNOSIS — Z953 Presence of xenogenic heart valve: Secondary | ICD-10-CM

## 2017-08-21 DIAGNOSIS — Z888 Allergy status to other drugs, medicaments and biological substances status: Secondary | ICD-10-CM

## 2017-08-21 DIAGNOSIS — I482 Chronic atrial fibrillation: Secondary | ICD-10-CM

## 2017-08-21 DIAGNOSIS — I252 Old myocardial infarction: Secondary | ICD-10-CM

## 2017-08-21 DIAGNOSIS — I2510 Atherosclerotic heart disease of native coronary artery without angina pectoris: Secondary | ICD-10-CM | POA: Diagnosis not present

## 2017-08-21 DIAGNOSIS — Z951 Presence of aortocoronary bypass graft: Secondary | ICD-10-CM

## 2017-08-21 DIAGNOSIS — E119 Type 2 diabetes mellitus without complications: Secondary | ICD-10-CM | POA: Diagnosis not present

## 2017-08-21 DIAGNOSIS — I481 Persistent atrial fibrillation: Secondary | ICD-10-CM | POA: Diagnosis present

## 2017-08-21 LAB — CBC
HEMATOCRIT: 44.9 % (ref 40.0–52.0)
HEMOGLOBIN: 15 g/dL (ref 13.0–18.0)
MCH: 27.7 pg (ref 26.0–34.0)
MCHC: 33.4 g/dL (ref 32.0–36.0)
MCV: 83.1 fL (ref 80.0–100.0)
Platelets: 239 10*3/uL (ref 150–440)
RBC: 5.4 MIL/uL (ref 4.40–5.90)
RDW: 15.4 % — AB (ref 11.5–14.5)
WBC: 10.2 10*3/uL (ref 3.8–10.6)

## 2017-08-21 LAB — GLUCOSE, CAPILLARY
GLUCOSE-CAPILLARY: 243 mg/dL — AB (ref 65–99)
Glucose-Capillary: 222 mg/dL — ABNORMAL HIGH (ref 65–99)

## 2017-08-21 LAB — BASIC METABOLIC PANEL
Anion gap: 15 (ref 5–15)
BUN: 10 mg/dL (ref 6–20)
CHLORIDE: 98 mmol/L — AB (ref 101–111)
CO2: 21 mmol/L — ABNORMAL LOW (ref 22–32)
Calcium: 9.8 mg/dL (ref 8.9–10.3)
Creatinine, Ser: 0.9 mg/dL (ref 0.61–1.24)
Glucose, Bld: 438 mg/dL — ABNORMAL HIGH (ref 65–99)
POTASSIUM: 4.3 mmol/L (ref 3.5–5.1)
SODIUM: 134 mmol/L — AB (ref 135–145)

## 2017-08-21 LAB — TROPONIN I
TROPONIN I: 0.36 ng/mL — AB (ref <0.03)
TROPONIN I: 0.69 ng/mL — AB (ref <0.03)
Troponin I: <0.03 ng/mL (ref <0.03)

## 2017-08-21 LAB — HEPARIN LEVEL (UNFRACTIONATED): Heparin Unfractionated: 0.32 IU/mL (ref 0.30–0.70)

## 2017-08-21 LAB — PROTIME-INR
INR: 1.02
Prothrombin Time: 13.3 seconds (ref 11.4–15.2)

## 2017-08-21 LAB — APTT: aPTT: 27 seconds (ref 24–36)

## 2017-08-21 MED ORDER — NITROGLYCERIN 2 % TD OINT
1.0000 [in_us] | TOPICAL_OINTMENT | Freq: Once | TRANSDERMAL | Status: AC
  Administered 2017-08-21: 1 [in_us] via TOPICAL

## 2017-08-21 MED ORDER — NITROGLYCERIN 2 % TD OINT
TOPICAL_OINTMENT | TRANSDERMAL | Status: AC
  Filled 2017-08-21: qty 1

## 2017-08-21 MED ORDER — PANTOPRAZOLE SODIUM 40 MG PO TBEC
40.0000 mg | DELAYED_RELEASE_TABLET | Freq: Every day | ORAL | Status: DC
  Administered 2017-08-23: 40 mg via ORAL
  Filled 2017-08-21: qty 1

## 2017-08-21 MED ORDER — ASPIRIN EC 81 MG PO TBEC
81.0000 mg | DELAYED_RELEASE_TABLET | Freq: Every day | ORAL | Status: DC
  Administered 2017-08-23: 81 mg via ORAL
  Filled 2017-08-21: qty 1

## 2017-08-21 MED ORDER — HEPARIN SODIUM (PORCINE) 5000 UNIT/ML IJ SOLN: 5000.0000 [IU] | Freq: Three times a day (TID) | INTRAMUSCULAR | Status: DC

## 2017-08-21 MED ORDER — SODIUM CHLORIDE 0.9% FLUSH
3.0000 mL | Freq: Two times a day (BID) | INTRAVENOUS | Status: DC
  Administered 2017-08-21 – 2017-08-22 (×2): 3 mL via INTRAVENOUS

## 2017-08-21 MED ORDER — ONDANSETRON 4 MG PO TBDP
4.0000 mg | ORAL_TABLET | Freq: Three times a day (TID) | ORAL | Status: DC | PRN
  Filled 2017-08-21: qty 1

## 2017-08-21 MED ORDER — ONDANSETRON HCL 4 MG/2ML IJ SOLN: 4.0000 mg | Freq: Four times a day (QID) | INTRAMUSCULAR | Status: DC | PRN

## 2017-08-21 MED ORDER — DIGOXIN 0.0625 MG HALF TABLET
0.0625 mg | ORAL_TABLET | Freq: Every day | ORAL | Status: DC
Start: 2017-08-22 — End: 2017-08-23
  Administered 2017-08-23: 0.0625 mg via ORAL
  Filled 2017-08-21 (×2): qty 1

## 2017-08-21 MED ORDER — HEPARIN BOLUS VIA INFUSION
4000.0000 [IU] | Freq: Once | INTRAVENOUS | Status: DC
  Filled 2017-08-21: qty 4000

## 2017-08-21 MED ORDER — NITROGLYCERIN 0.4 MG SL SUBL
0.4000 mg | SUBLINGUAL_TABLET | SUBLINGUAL | Status: DC | PRN
  Administered 2017-08-21 – 2017-08-23 (×4): 0.4 mg via SUBLINGUAL
  Filled 2017-08-21 (×4): qty 1

## 2017-08-21 MED ORDER — CLOPIDOGREL BISULFATE 75 MG PO TABS
75.0000 mg | ORAL_TABLET | Freq: Every day | ORAL | Status: DC
  Administered 2017-08-23: 75 mg via ORAL
  Filled 2017-08-21: qty 1

## 2017-08-21 MED ORDER — DOCUSATE SODIUM 100 MG PO CAPS
100.0000 mg | ORAL_CAPSULE | Freq: Two times a day (BID) | ORAL | Status: DC
  Administered 2017-08-22 – 2017-08-23 (×2): 100 mg via ORAL
  Filled 2017-08-21 (×3): qty 1

## 2017-08-21 MED ORDER — RANOLAZINE ER 500 MG PO TB12
1000.0000 mg | ORAL_TABLET | Freq: Two times a day (BID) | ORAL | Status: DC
  Administered 2017-08-21 – 2017-08-23 (×3): 1000 mg via ORAL
  Filled 2017-08-21 (×5): qty 2

## 2017-08-21 MED ORDER — NITROGLYCERIN 0.4 MG SL SUBL: 0.4000 mg | SUBLINGUAL_TABLET | SUBLINGUAL | Status: DC | PRN

## 2017-08-21 MED ORDER — ACETAMINOPHEN 650 MG RE SUPP: 650.0000 mg | Freq: Four times a day (QID) | RECTAL | Status: DC | PRN

## 2017-08-21 MED ORDER — ONDANSETRON HCL 4 MG PO TABS: 4.0000 mg | ORAL_TABLET | Freq: Four times a day (QID) | ORAL | Status: DC | PRN

## 2017-08-21 MED ORDER — NITROGLYCERIN 2 % TD OINT
1.0000 [in_us] | TOPICAL_OINTMENT | Freq: Four times a day (QID) | TRANSDERMAL | Status: DC
  Administered 2017-08-22 (×2): 1 [in_us] via TOPICAL
  Filled 2017-08-21 (×2): qty 1

## 2017-08-21 MED ORDER — SPIRONOLACTONE 12.5 MG HALF TABLET
12.5000 mg | ORAL_TABLET | Freq: Every day | ORAL | Status: DC
  Administered 2017-08-23: 12.5 mg via ORAL
  Filled 2017-08-21 (×2): qty 1

## 2017-08-21 MED ORDER — METOPROLOL SUCCINATE ER 50 MG PO TB24
50.0000 mg | ORAL_TABLET | Freq: Every day | ORAL | Status: DC
  Administered 2017-08-23: 50 mg via ORAL
  Filled 2017-08-21: qty 1

## 2017-08-21 MED ORDER — ISOSORBIDE MONONITRATE ER 60 MG PO TB24: 60.0000 mg | ORAL_TABLET | Freq: Every day | ORAL | Status: DC

## 2017-08-21 MED ORDER — INSULIN ASPART 100 UNIT/ML ~~LOC~~ SOLN
0.0000 [IU] | Freq: Three times a day (TID) | SUBCUTANEOUS | Status: DC
  Administered 2017-08-21: 3 [IU] via SUBCUTANEOUS
  Administered 2017-08-22 (×2): 2 [IU] via SUBCUTANEOUS
  Administered 2017-08-23: 1 [IU] via SUBCUTANEOUS
  Filled 2017-08-21 (×4): qty 1

## 2017-08-21 MED ORDER — FENTANYL CITRATE (PF) 100 MCG/2ML IJ SOLN
50.0000 ug | INTRAMUSCULAR | Status: DC | PRN
Start: 2017-08-21 — End: 2017-08-21
  Administered 2017-08-21: 50 ug via INTRAVENOUS
  Filled 2017-08-21: qty 2

## 2017-08-21 MED ORDER — ATORVASTATIN CALCIUM 20 MG PO TABS
80.0000 mg | ORAL_TABLET | Freq: Every day | ORAL | Status: DC
  Administered 2017-08-21 – 2017-08-22 (×2): 80 mg via ORAL
  Filled 2017-08-21 (×2): qty 4

## 2017-08-21 MED ORDER — MECLIZINE HCL 25 MG PO TABS
25.0000 mg | ORAL_TABLET | Freq: Three times a day (TID) | ORAL | Status: DC | PRN
  Filled 2017-08-21: qty 1

## 2017-08-21 MED ORDER — BISACODYL 5 MG PO TBEC
5.0000 mg | DELAYED_RELEASE_TABLET | Freq: Every day | ORAL | Status: DC | PRN
  Filled 2017-08-21: qty 1

## 2017-08-21 MED ORDER — ACETAMINOPHEN 325 MG PO TABS
650.0000 mg | ORAL_TABLET | Freq: Four times a day (QID) | ORAL | Status: DC | PRN
Start: 2017-08-21 — End: 2017-08-23

## 2017-08-21 MED ORDER — SODIUM CHLORIDE 0.9 % IV SOLN
INTRAVENOUS | Status: DC
  Administered 2017-08-21: 20:00:00 via INTRAVENOUS

## 2017-08-21 MED ORDER — HEPARIN (PORCINE) IN NACL 100-0.45 UNIT/ML-% IJ SOLN
1200.0000 [IU]/h | INTRAMUSCULAR | Status: DC
  Administered 2017-08-21: 1200 [IU]/h via INTRAVENOUS
  Filled 2017-08-21 (×2): qty 250

## 2017-08-21 NOTE — Consult Note (Signed)
Cardiology Consultation:   Patient ID: Christopher Armato Sr.; [id number]; December 04, 1950   Admit date: 08/21/2017 Date of Consult: 08/21/2017  Primary Care Provider: [name], NP Primary Cardiologist: Kathlyn Sacramento, MD Primary Electrophysiologist:  None   Patient Profile:   Christopher Ivory Sr. is a 67 y.o. male with a hx of CAD s/p CABG and multiple PCIs (most recently 05/2017), aortic stenosis s/p bioprosthetic AVR, HTN, HLD, ischemic cardiomyopathy with chronic systolic heart failure, diabetes mellitus, and rectal bleeding who is being seen today for the evaluation of chest pain at the request of Dr. Vianne Bulls.  History of Present Illness:   Christopher Cummings has been in his usual state of health and participated in cardiac rehab this morning.  He was scheduled to be seen in our office this afternoon and was seated in the waiting area at approximately 1 PM.  He had sudden onset of acute substernal chest pain radiating to the back and the left arm/jaw.  Pain was 10/10 in severity and was accompanied by some shortness of breath and palpitations; he states it felt as though his heart were racing out of his chest.  He had never felt this sort of pain before and was taken immediately to the emergency department.  He received sublingual nitroglycerin with gradual improvement in the pain.  He has subsequently noted continued decrease in the pain after application of nitroglycerin paste.  Currently his discomfort is 3/10, though he states "I could run a marathon right now."  Christopher Cummings denies any clear precipitants of today's episode.  He has been compliant with his medications and has been participating in cardiac rehab without any recurrence of symptoms.  He notes that he tripped and fell onto his chest wall about 2 weeks ago.  He noted some mild right-sided chest wall tenderness, though this has now resolved.  Past Medical History:  Diagnosis Date  . Aortic stenosis    a. 07/2015 s/p  bioprosthetic AVR (#23 [employer]) - [place]; b. 04/2017 Echo: nl fxn'ing AoV.  Marland Kitchen CAD (coronary artery disease)    a. 2009 PCI->D1; b. 07/2015 CABG x 2 reported (LIMA-LAD noted on cath 01/2017) [facility] [place]; c. 2017/2018 Prox/Dist LCX & D1 stenting; d. 01/2017 PCI: ISR prox/dist LCX stents (PTCA), ISR D1 (med Rx); e. 04/2017 PCI: CBA for ISR- LCX 90p, 70d, D1 95; f. 05/2017 PCI: D1 99 ISR, LCX 70p (2.25x22 Onyx DES); g. 06/2017 Cath: Patent LCX and RCA stents. D1 100 ISR->Med Rx.  . Chronic systolic CHF (congestive heart failure) ([place])    a. 04/2017 Echo: EF 35-40% with nl functioning bioprosthetic AoV, mild MR, nl PA.  . Colitis   . Diabetes mellitus with complication ([place])   . Diverticulitis   . Essential hypertension   . Hyperlipidemia   . Ischemic cardiomyopathy    a. 04/2017 Echo: EF 35-40%.  . Permanent atrial fibrillation ([place])    a. CHA2DS2VASc = 4-->No OAC 2/2 ongoing ASA/Plavix and h/o rectal bleeding.  . Rectal bleeding    a. 03/2017 -> f/u @ [facility].    Past Surgical History:  Procedure Laterality Date  . AORTIC VALVE REPLACEMENT    . CORONARY ARTERY BYPASS GRAFT    . CORONARY BALLOON ANGIOPLASTY N/A 04/02/2017   Procedure: CORONARY BALLOON ANGIOPLASTY;  Surgeon: Troy Sine, MD;  Location: [place] CV [facility];  Service: Cardiovascular;  Laterality: N/A;  . CORONARY STENT INTERVENTION N/A 02/26/2017   Procedure: CORONARY STENT INTERVENTION;  Surgeon: Wellington Hampshire,  MD;  Location: [place] [facility];  Service: Cardiovascular;  Laterality: N/A;  . CORONARY STENT INTERVENTION N/A 05/08/2017   Procedure: CORONARY STENT INTERVENTION;  Surgeon: Nelva Bush, MD;  Location: [place] [facility];  Service: Cardiovascular;  Laterality: N/A;  . CORONARY/GRAFT ANGIOGRAPHY N/A 02/26/2017   Procedure: CORONARY/GRAFT ANGIOGRAPHY;  Surgeon: Wellington Hampshire, MD;  Location: [place] [facility];  Service: Cardiovascular;  Laterality: N/A;  . CORONARY/GRAFT ANGIOGRAPHY N/A  04/02/2017   Procedure: CORONARY/GRAFT ANGIOGRAPHY;  Surgeon: Troy Sine, MD;  Location: [place] [facility];  Service: Cardiovascular;  Laterality: N/A;  . CORONARY/GRAFT ANGIOGRAPHY N/A 05/08/2017   Procedure: CORONARY/GRAFT ANGIOGRAPHY;  Surgeon: Nelva Bush, MD;  Location: [place] [facility];  Service: Cardiovascular;  Laterality: N/A;  . JOINT REPLACEMENT Left    KNEE  . KNEE ARTHROPLASTY  2000  . LEFT HEART CATH AND CORONARY ANGIOGRAPHY N/A 06/15/2017   Procedure: LEFT HEART CATH AND CORONARY ANGIOGRAPHY;  Surgeon: Wellington Hampshire, MD;  Location: [place] [facility];  Service: Cardiovascular;  Laterality: N/A;     Home Medications:  Prior to Admission medications   Medication Sig Start Date Khyla Mccumbers Date Taking? Authorizing Provider  aspirin EC 81 MG tablet Take 1 tablet (81 mg total) by mouth daily. 06/13/17  Yes Theora Gianotti, NP  atorvastatin (LIPITOR) 80 MG tablet Take 1 tablet (80 mg total) by mouth at bedtime. 04/30/17  Yes [name], NP  clopidogrel (PLAVIX) 75 MG tablet Take 1 tablet (75 mg total) daily by mouth. 05/21/17  Yes Dunn, Areta Haber, PA-C  digoxin (LANOXIN) 0.125 MG tablet Take 0.5 tablets (0.0625 mg total) by mouth daily. 08/08/17 08/08/18 Yes Dunn, Areta Haber, PA-C  docusate sodium (COLACE) 100 MG capsule Take 1 capsule (100 mg total) by mouth 2 (two) times daily. 06/29/17  Yes [name], NP  empagliflozin (JARDIANCE) 25 MG TABS tablet Take 25 mg daily by mouth. 05/15/17  Yes [name], NP  isosorbide mononitrate (IMDUR) 60 MG 24 hr tablet Take 1 tablet (60 mg total) by mouth daily. 06/13/17 09/11/17 Yes Theora Gianotti, NP  metFORMIN (GLUCOPHAGE-XR) 500 MG 24 hr tablet Take 1 tablet (500 mg total) daily with breakfast by mouth. 05/15/17 05/15/18 Yes [name], NP  metoprolol succinate (TOPROL-XL) 50 MG 24 hr tablet Take one tablet (50 mg) daily if systolic blood pressure is >100 06/21/17  Yes Theora Gianotti, NP  pantoprazole (PROTONIX) 40 MG tablet Take 1 tablet (40 mg total) daily by mouth. 05/21/17  Yes Dunn, Areta Haber, PA-C  predniSONE (DELTASONE) 50 MG tablet Take 1 tablet (50 mg total) by mouth daily with breakfast. 08/06/17  Yes Cuthriell, Charline Bills, PA-C  ranolazine (RANEXA) 1000 MG SR tablet Take 1 tablet (1,000 mg total) by mouth 2 (two) times daily. 07/10/17  Yes Wellington Hampshire, MD  spironolactone (ALDACTONE) 25 MG tablet Take 0.5 tablets (12.5 mg total) daily by mouth. 05/21/17 08/21/17 Yes Dunn, Areta Haber, PA-C  acetaminophen (TYLENOL) 325 MG tablet Take 2 tablets (650 mg total) by mouth every 6 (six) hours as needed for mild pain (or Fever >/= 101). 04/26/17   Nicholes Mango, MD  alum & mag hydroxide-simeth (MAALOX/MYLANTA) 200-200-20 MG/5ML suspension Take 30 mLs by mouth every 6 (six) hours as needed for indigestion or heartburn. 07/20/17   Gladstone Lighter, MD  blood glucose meter kit and supplies Dispense based on patient and insurance preference. Use 1 time daily as directed. (FOR ICD-9 250.00, 250.01).  04/30/17   [name], NP  Blood Pressure Monitoring (BLOOD PRESSURE CUFF) MISC 1 Units by Does not apply route daily. 04/30/17   [name], NP  cyclobenzaprine (FLEXERIL) 5 MG tablet Take 1 tablet (5 mg total) by mouth 3 (three) times daily as needed for muscle spasms. 07/20/17   Gladstone Lighter, MD  HYDROcodone-acetaminophen (NORCO/VICODIN) 5-325 MG tablet Take 1 tablet by mouth every 4 (four) hours as needed for moderate pain. Patient not taking: Reported on 08/21/2017 08/06/17   Cuthriell, Charline Bills, PA-C  meclizine (ANTIVERT) 25 MG tablet Take 25 mg by mouth 3 (three) times daily as needed for dizziness.    [provider]  nitroGLYCERIN (NITROSTAT) 0.4 MG SL tablet Place 1 tablet (0.4 mg total) under the tongue every 5 (five) minutes as needed for chest pain. 07/13/17   Wellington Hampshire, MD  ondansetron (ZOFRAN ODT) 4 MG disintegrating  tablet Take 1 tablet (4 mg total) by mouth every 8 (eight) hours as needed for nausea or vomiting. 06/25/17   Rudene Re, MD    Inpatient Medications: Scheduled Meds: . [START ON 08/22/2017] aspirin EC  81 mg Oral Daily  . atorvastatin  80 mg Oral QHS  . [START ON 08/22/2017] clopidogrel  75 mg Oral Daily  . [START ON 08/22/2017] digoxin  0.0625 mg Oral Daily  . docusate sodium  100 mg Oral BID  . insulin aspart  0-9 Units Subcutaneous TID WC  . [START ON 08/22/2017] metoprolol succinate  50 mg Oral Daily  . nitroGLYCERIN  1 inch Topical Q6H  . pantoprazole  40 mg Oral Daily  . ranolazine  1,000 mg Oral BID  . [START ON 08/22/2017] spironolactone  12.5 mg Oral Daily   Continuous Infusions: . sodium chloride    . heparin 1,200 Units/hr (08/21/17 1517)   PRN Meds: acetaminophen **OR** acetaminophen, bisacodyl, meclizine, nitroGLYCERIN, ondansetron **OR** ondansetron (ZOFRAN) IV, ondansetron  Allergies:    Allergies  Allergen Reactions  . Metformin And Related Other (See Comments)    Only the regular Metformin (diarrhea)    Social History:   Social History   Socioeconomic History  . Marital status: Divorced    Spouse name: Not on file  . Number of children: 1  . Years of education: 84  . Highest education level: 12th grade  Social Needs  . Financial resource strain: Not hard at all  . Food insecurity - worry: Never true  . Food insecurity - inability: Never true  . Transportation needs - medical: No  . Transportation needs - non-medical: No  Occupational History  . Not on file  Tobacco Use  . Smoking status: Never Smoker  . Smokeless tobacco: Never Used  Substance and Sexual Activity  . Alcohol use: No    Comment: No Alcohol 12 years, but drank 2 "bottles of liquor" each week for several years prior to quitting  . Drug use: No  . Sexual activity: Not Currently  Other Topics Concern  . Not on file  Social History Narrative  . Not on file    Family History:    Family History  Problem Relation Age of Onset  . CAD Mother   . Heart failure Mother   . Lupus Mother   . CAD Brother   . Heart attack Brother   . Prostate cancer Father   . Diabetes Sister   . Healthy Paternal Grandmother   . Prostate cancer Paternal Grandfather   . Healthy Brother   . Healthy Sister  ROS:  Please see the history of present illness. All other ROS reviewed and negative.     Physical Exam/Data:   Vitals:   08/21/17 1700 08/21/17 1800 08/21/17 1835 08/21/17 1836  BP: 132/60 116/72  138/68  Pulse: 73 66  75  Resp: _0 Temp:    97.7 F (36.5 C)  TempSrc:    Oral  SpO2: 92% 94%  91%  Weight:   211 lb 12.8 oz (96.1 kg)   Height:   _1  (1.753 m)     Intake/Output Summary (Last 24 hours) at 08/21/2017 1909 Last data filed at 08/21/2017 1848 Gross per 24 hour  Intake -  Output 350 ml  Net -350 ml   Filed Weights   08/21/17 1352 08/21/17 1835  Weight: 214 lb (97.1 kg) 211 lb 12.8 oz (96.1 kg)   Body mass index is 31.28 kg/m.  General: Obese man, seated comfortably in the exam room. HEENT: normal Lymph: no adenopathy Neck: no JVD Endocrine:  No thryomegaly Vascular: No carotid bruits; 1+ radial pulses bilaterally. Cardiac:  normal S1, S2; RRR; 1/6 systolic murmur.  No rubs or gallops. Lungs:  clear to auscultation bilaterally, no wheezing, rhonchi or rales  Abd: soft, nontender, no hepatomegaly  Ext: no edema Musculoskeletal:  No deformities, BUE and BLE strength normal and equal Skin: warm and dry  Neuro:  CNs 2-12 intact, no focal abnormalities noted Psych:  Normal affect   EKG:  The EKG was personally reviewed and demonstrates: Normal sinus rhythm with poor R wave progression and nonspecific ST segment changes. Telemetry:  Telemetry was personally reviewed and demonstrates: Normal sinus rhythm with PACs.  Relevant CV Studies: LHC (06/15/17): 1.  Significant underlying three-vessel coronary artery disease with patent stent in the  RCA with only mild in-stent restenosis, patent proximal left circumflex stent with no significant restenosis, patent distal left circumflex stent with moderate restenosis.  The LIMA to LAD is known to be patent and was not injected.  The first diagonal is completely occluded.  2.  Ascending aortogram was performed to ensure there are no other grafts were present.  This showed no evidence of patent grafts.  The prostatic aortic valve appears intact with no significant regurgitation.  Laboratory Data:  Chemistry Recent Labs  Lab 08/21/17 1354  NA 134*  K 4.3  CL 98*  CO2 21*  GLUCOSE 438*  BUN 10  CREATININE 0.90  CALCIUM 9.8  GFRNONAA >60  GFRAA >60  ANIONGAP 15    No results for input(s): PROT, ALBUMIN, AST, ALT, ALKPHOS, BILITOT in the last 168 hours. Hematology Recent Labs  Lab 08/21/17 1354  WBC 10.2  RBC 5.40  HGB 15.0  HCT 44.9  MCV 83.1  MCH 27.7  MCHC 33.4  RDW 15.4*  PLT 239   Cardiac Enzymes Recent Labs  Lab 08/21/17 1354 08/21/17 1731  TROPONINI <0.03 0.36*   No results for input(s): TROPIPOC in the last 168 hours.  BNPNo results for input(s): BNP, PROBNP in the last 168 hours.  DDimer No results for input(s): DDIMER in the last 168 hours.  Radiology/Studies:  Dg Chest Portable 1 View  Result Date: 08/21/2017 CLINICAL DATA:  Chest pain. EXAM: PORTABLE CHEST 1 VIEW COMPARISON:  08/06/2017. FINDINGS: Prior median sternotomy and cardiac valve replacement. Cardiomegaly with normal pulmonary vascularity. Low lung volumes. No pleural effusion or pneumothorax IMPRESSION: 1. Prior median sternotomy and cardiac valve replacement. Cardiomegaly with normal pulmonary vascularity. 2.  No focal infiltrate. Electronically Signed  By: [place]   On: 08/21/2017 14:27    Assessment and Plan:   NSTEMI Patient reports acute onset of chest pain this afternoon while seated in the waiting room for office.  There are no clear precipitants, though Christopher Cummings also  noted palpitations at the time.  I wonder if he may have had a salvo of atrial fibrillation with rapid ventricular response.  He continues to have mild chest discomfort, though it has improved considerably over the last few hours.  EKG shows nonspecific changes.  Troponin has increased from <0.03 -> 0.36.  Continue heparin infusion and nitroglycerin paste.  If chest pain does not improve over the next few hours, patient may require urgent catheterization.  We will reassess symptoms and troponin tomorrow to determine need for repeat catheterization.  Of note, Christopher Cummings has had Numerous catheterizations as recently as 06/2017.  Continue dual antiplatelet therapy with aspirin and clopidogrel.  Continue metoprolol succinate and ranolazine.  Continue high intensity statin.  Keep n.p.o. after midnight.  Ischemic cardiomyopathy with chronic systolic heart failure Mr. Al appears euvolemic on exam.  Continue metoprolol, spironolactone, and digoxin.  It appears that Christopher Cummings is not currently on an ACE inhibitor/ARB due to hypotension.  Check digoxin level in the morning.  Chronic atrial fibrillation EKG and telemetry shows sinus rhythm with PACs.  Continue metoprolol, digoxin, and heparin infusion.  Long-term anticoagulation currently being deferred due to GI bleed in the past and ongoing DAPT.  Hyperlipidemia Goal LDL less than 70.  Continue atorvastatin 80 mg daily.  Aortic stenosis status post bioprosthetic AVR I do not believe patient's acute chest pain is related to valvular incompetence.  Normal function noted by most recent echo in 04/2017.  Continue aspirin.  Essential hypertension Blood pressure quite elevated earlier today but now upper normal to mildly elevated.  Consider adding ACE inhibitor/ARB prior to discharge if blood pressure remains normal/elevated.  Type 2 diabetes mellitus Suboptimally controlled with hemoglobin A1c of 9.3 in November.  Further  management per internal medicine.  For questions or updates, please contact [place] Please consult [email or web address] for contact info under [facility] Cardiology.   Signed, Nelva Bush, MD  08/21/2017 7:09 PM

## 2017-08-21 NOTE — Progress Notes (Signed)
Daily Session Note  Patient Details  Name: Christopher Penkala Sr. MRN: [id number] Date of Birth: Feb 14, 1951 Referring Provider:     Cardiac Rehab from 05/21/2017 in [facility] Cardiac [facility]  Referring Provider  Kathlyn Sacramento MD      Encounter Date: 08/21/2017  Check In: Session Check In - 08/21/17 0904      Check-In   Location  [facility]-Cardiac & Pulmonary Rehab    Staff Present  Alberteen Sam, MA, RCEP, CCRP, Exercise Physiologist;Amanda Oletta Darter, BA, ACSM CEP, Exercise Physiologist;Susanne Bice, RN, BSN, CCRP    Supervising physician immediately available to respond to emergencies  See telemetry face sheet for immediately available ER MD    Medication changes reported      No    Fall or balance concerns reported     No    Warm-up and Cool-down  Performed on first and last piece of equipment    Resistance Training Performed  Yes    VAD Patient?  No      Pain Assessment   Currently in Pain?  No/denies    Multiple Pain Sites  No          Social History   Tobacco Use  Smoking Status Never Smoker  Smokeless Tobacco Never Used    Goals Met:  Independence with exercise equipment Exercise tolerated well No report of cardiac concerns or symptoms Strength training completed today  Goals Unmet:  Not Applicable  Comments: Pt able to follow exercise prescription today without complaint.  Will continue to monitor for progression.    Dr. Emily Filbert is Medical Director for [facility].

## 2017-08-21 NOTE — Progress Notes (Signed)
Patient admitted to unit. Oriented to room, call bell, and staff. Bed in lowest position. Fall safety plan reviewed. Focused assessment to Epic. Skin assessment verified with Rea CollegeJessica RN. Telemetry box verification with tele clerk- Box#: [id number] Will continue to monitor.  Refusing bed alarm while he eats dinner. Educated on safety.

## 2017-08-21 NOTE — Progress Notes (Addendum)
ANTICOAGULATION CONSULT NOTE - Initial Consult  Pharmacy Consult for heparin Indication: chest pain/ACS  Allergies  Allergen Reactions  . Metformin And Related Other (See Comments)    Only the regular Metformin (diarrhea)    Patient Measurements: Height: 5\' 9"  (175.3 cm) Weight: 211 lb 12.8 oz (96.1 kg) IBW/kg (Calculated) : 70.7 Heparin Dosing Weight: 91 kg  Vital Signs: Temp: 98.2 F (36.8 C) (02/19 2015) Temp Source: Oral (02/19 1836) BP: 99/74 (02/19 2015) Pulse Rate: 131 (02/19 2015)  Labs: Recent Labs    08/21/17 1354 08/21/17 1409 08/21/17 1731 08/21/17 2129  HGB 15.0  --   --   --   HCT 44.9  --   --   --   PLT 239  --   --   --   APTT  --  27  --   --   LABPROT  --  13.3  --   --   INR  --  1.02  --   --   HEPARINUNFRC  --   --   --  0.32  CREATININE 0.90  --   --   --   TROPONINI <0.03  --  0.36* 0.69*    Estimated Creatinine Clearance: 94.9 mL/min (by C-G formula based on SCr of 0.9 mg/dL).   Medical History: Past Medical History:  Diagnosis Date  . Aortic stenosis    a. 07/2015 s/p bioprosthetic AVR (#23 [employer]) - [place]; b. 04/2017 Echo: nl fxn'ing AoV.  Marland Kitchen. CAD (coronary artery disease)    a. 2009 PCI->D1; b. 07/2015 CABG x 2 reported (LIMA-LAD noted on cath 01/2017) [facility] [place]; c. 2017/2018 Prox/Dist LCX & D1 stenting; d. 01/2017 PCI: ISR prox/dist LCX stents (PTCA), ISR D1 (med Rx); e. 04/2017 PCI: CBA for ISR- LCX 90p, 70d, D1 95; f. 05/2017 PCI: D1 99 ISR, LCX 70p (2.25x22 Onyx DES); g. 06/2017 Cath: Patent LCX and RCA stents. D1 100 ISR->Med Rx.  . Chronic systolic CHF (congestive heart failure) ([facility])    a. 04/2017 Echo: EF 35-40% with nl functioning bioprosthetic AoV, mild MR, nl PA.  . Colitis   . Diabetes mellitus with complication ([facility])   . Diverticulitis   . Essential hypertension   . Hyperlipidemia   . Ischemic cardiomyopathy    a. 04/2017 Echo: EF 35-40%.  . Permanent atrial fibrillation ([facility])    a. CHA2DS2VASc =  4-->No OAC 2/2 ongoing ASA/Plavix and h/o rectal bleeding.  . Rectal bleeding    a. 03/2017 -> f/u @ [facility].    Medications:  Infusions:  . sodium chloride 50 mL/hr at 08/21/17 1956  . heparin 1,200 Units/hr (08/21/17 1517)    Assessment: 66 yom from Dr. Windell HummingbirdGollan's office d/t left-sided chest pain x 30 min PTA. Hx MI and patient reports this feels similar. Initial troponin <0.03, abnormal ECG with possible anterior infarct and T wave abnormalities. Pharmacy consulted to dose UFH for ACS. No PTA OAC noted.  Goal of Therapy:  Heparin level 0.3-0.7 units/ml Monitor platelets by anticoagulation protocol: Yes   Plan:  Give 4000 units bolus x 1 Start heparin infusion at 1200 units/hr Check anti-Xa level in 6 hours and daily while on heparin Continue to monitor H&H and platelets   02/19 2130 HL 0.32. Continue current regimen. Recheck heparin level and CBC with tomorrow AM labs.   02/20 AM heparin level 0.33. Continue current regimen. Recheck heparin level and CBC with tomorrow AM labs.   Valentina Alcoser S, Pharm.D., BCPS Clinical Pharmacist  08/21/2017,10:57 PM

## 2017-08-21 NOTE — ED Notes (Signed)
ED TO INPATIENT HANDOFF REPORT  Name/Age/Gender Christopher Ivory Sr. 67 y.o. male  Code Status    Code Status Orders  (From admission, onward)        Start     Ordered   08/21/17 1506  Full code  Continuous     08/21/17 1507    Code Status History    Date Active Date Inactive Code Status Order ID Comments User Context   07/19/2017 02:03 07/20/2017 16:24 Full Code [id number]  Amelia Jo, MD Inpatient   06/14/2017 12:45 06/16/2017 17:07 Full Code [id number]  Saundra Shelling, MD Inpatient   05/31/2017 10:53 06/01/2017 16:03 Full Code [id number]  Epifanio Lesches, MD ED   05/07/2017 02:49 05/09/2017 18:58 Full Code [id number]  Lance Coon, MD Inpatient   04/24/2017 00:35 04/26/2017 18:44 Full Code [id number]  Lance Coon, MD Inpatient   03/31/2017 20:25 04/03/2017 18:53 Full Code [id number]  Flossie Dibble, MD Inpatient   03/13/2017 01:41 03/14/2017 15:32 Full Code [id number]  Lance Coon, MD Inpatient   02/24/2017 04:20 02/27/2017 14:10 Full Code [id number]  Harrie Foreman, MD Inpatient      Home/SNF/Other Home  Chief Complaint chest pain  Level of Care/Admitting Diagnosis ED Disposition    ED Disposition Condition [place]: [place] [[id number]]  Level of Care: Telemetry [5]  Diagnosis: Chest pain [599357]  Admitting Physician: Epifanio Lesches [[id number]]  Attending Physician: Epifanio Lesches [phone]  PT Class (Do Not Modify): Observation [104]  PT Acc Code (Do Not Modify): Observation [10022]       Medical History Past Medical History:  Diagnosis Date  . Aortic stenosis    a. 07/2015 s/p bioprosthetic AVR (#23 [employer]) - [place]; b. 04/2017 Echo: nl fxn'ing AoV.  Marland Kitchen CAD (coronary artery disease)    a. 2009 PCI->D1; b. 07/2015 CABG x 2 reported (LIMA-LAD noted on cath 01/2017) [facility] - [place]; c. 2017/2018 Prox/Dist LCX & D1 stenting; d. 01/2017 PCI: ISR prox/dist LCX stents (PTCA), ISR D1 (med Rx); e.  04/2017 PCI: CBA for ISR- LCX 90p, 70d, D1 95; f. 05/2017 PCI: D1 99 ISR, LCX 70p (2.25x22 Onyx DES); g. 06/2017 Cath: Patent LCX and RCA stents. D1 100 ISR->Med Rx.  . Chronic systolic CHF (congestive heart failure) ([place])    a. 04/2017 Echo: EF 35-40% with nl functioning bioprosthetic AoV, mild MR, nl PA.  . Colitis   . Diabetes mellitus with complication ([place])   . Diverticulitis   . Essential hypertension   . Hyperlipidemia   . Ischemic cardiomyopathy    a. 04/2017 Echo: EF 35-40%.  . Permanent atrial fibrillation ([place])    a. CHA2DS2VASc = 4-->No OAC 2/2 ongoing ASA/Plavix and h/o rectal bleeding.  . Rectal bleeding    a. 03/2017 -> f/u @ [facility].    Allergies Allergies  Allergen Reactions  . Metformin And Related Other (See Comments)    Only the regular Metformin (diarrhea)    IV Location/Drains/Wounds Patient Lines/Drains/Airways Status   Active Line/Drains/Airways    Name:   Placement date:   Placement time:   Site:   Days:   Peripheral IV 08/21/17 Right Forearm   08/21/17    1411    Forearm   less than 1   Peripheral IV 08/21/17 Left Hand   08/21/17    -    Hand   less than 1          Labs/Imaging Results for orders placed or performed during  the hospital encounter of 08/21/17 (from the past 48 hour(s))  Basic metabolic panel     Status: Abnormal   Collection Time: 08/21/17  1:54 PM  Result Value Ref Range   Sodium 134 (L) 135 - 145 mmol/L   Potassium 4.3 3.5 - 5.1 mmol/L    Comment: HEMOLYSIS AT THIS LEVEL MAY AFFECT RESULT   Chloride 98 (L) 101 - 111 mmol/L   CO2 21 (L) 22 - 32 mmol/L   Glucose, Bld 438 (H) 65 - 99 mg/dL   BUN 10 6 - 20 mg/dL   Creatinine, Ser 0.90 0.61 - 1.24 mg/dL   Calcium 9.8 8.9 - 10.3 mg/dL   GFR calc non Af Amer >60 >60 mL/min   GFR calc Af Amer >60 >60 mL/min    Comment: (NOTE) The eGFR has been calculated using the CKD EPI equation. This calculation has not been validated in all clinical situations. eGFR's persistently <60 mL/min  signify possible Chronic Kidney Disease.    Anion gap 15 5 - 15    Comment: Performed at [facility], [address] [place], [place] [address]  CBC     Status: Abnormal   Collection Time: 08/21/17  1:54 PM  Result Value Ref Range   WBC 10.2 3.8 - 10.6 K/uL   RBC 5.40 4.40 - 5.90 MIL/uL   Hemoglobin 15.0 13.0 - 18.0 g/dL   HCT 44.9 40.0 - 52.0 %   MCV 83.1 80.0 - 100.0 fL   MCH 27.7 26.0 - 34.0 pg   MCHC 33.4 32.0 - 36.0 g/dL   RDW 15.4 (H) 11.5 - 14.5 %   Platelets 239 150 - 440 K/uL    Comment: Performed at [facility], [address] [place], [place] [address]  Troponin I     Status: None   Collection Time: 08/21/17  1:54 PM  Result Value Ref Range   Troponin I <0.03 <0.03 ng/mL    Comment: Performed at [facility], [address] [place], [place] [address]  Protime-INR     Status: None   Collection Time: 08/21/17  2:09 PM  Result Value Ref Range   Prothrombin Time 13.3 11.4 - 15.2 seconds   INR 1.02     Comment: Performed at [facility], [address] [place], [place] [address]  APTT     Status: None   Collection Time: 08/21/17  2:09 PM  Result Value Ref Range   aPTT 27 24 - 36 seconds    Comment: Performed at [facility], [address] [place], [place] [address]   Dg Chest Portable 1 View  Result Date: 08/21/2017 CLINICAL DATA:  Chest pain. EXAM: PORTABLE CHEST 1 VIEW COMPARISON:  08/06/2017. FINDINGS: Prior median sternotomy and cardiac valve replacement. Cardiomegaly with normal pulmonary vascularity. Low lung volumes. No pleural effusion or pneumothorax IMPRESSION: 1. Prior median sternotomy and cardiac valve replacement. Cardiomegaly with normal pulmonary vascularity. 2.  No focal infiltrate. Electronically Signed   By: Marcello Moores  Register   On: 08/21/2017 14:27    Pending Labs Unresulted Labs (From admission, onward)   Start     Ordered   08/22/17 6283  Basic metabolic panel  Tomorrow morning,   STAT      08/21/17 1507   08/22/17 0500  CBC  Tomorrow morning,   STAT     08/21/17 1507   08/21/17 2200  Heparin level (unfractionated)  Once-Timed,   STAT     08/21/17 1455   08/21/17  1507  Troponin I  Now then every 6 hours,   STAT     08/21/17 1507      Vitals/Pain Today's Vitals   08/21/17 1522 08/21/17 1600 08/21/17 1700 08/21/17 1738  BP: (!) 128/59 (!) 145/71 132/60   Pulse: 78 70 73   Resp: 12 17 17    Temp: 98 F (36.7 C)     TempSrc: Oral     SpO2: 98% 97% 92%   Weight:      Height:      PainSc: 4    3     Isolation Precautions No active isolations  Medications Medications  nitroGLYCERIN (NITROSTAT) SL tablet 0.4 mg (0.4 mg Sublingual Given 08/21/17 1415)  heparin ADULT infusion 100 units/mL (25000 units/256m sodium chloride 0.45%) (1,200 Units/hr Intravenous Transfusing/Transfer 08/21/17 1742)  aspirin EC tablet 81 mg (not administered)  atorvastatin (LIPITOR) tablet 80 mg (not administered)  clopidogrel (PLAVIX) tablet 75 mg (not administered)  digoxin (LANOXIN) tablet 0.0625 mg (not administered)  pantoprazole (PROTONIX) EC tablet 40 mg (not administered)  ondansetron (ZOFRAN-ODT) disintegrating tablet 4 mg (not administered)  nitroGLYCERIN (NITROSTAT) SL tablet 0.4 mg (not administered)  meclizine (ANTIVERT) tablet 25 mg (not administered)  metoprolol succinate (TOPROL-XL) 24 hr tablet 50 mg (not administered)  heparin injection 5,000 Units (not administered)  0.9 %  sodium chloride infusion (not administered)  acetaminophen (TYLENOL) tablet 650 mg (not administered)    Or  acetaminophen (TYLENOL) suppository 650 mg (not administered)  docusate sodium (COLACE) capsule 100 mg (not administered)  bisacodyl (DULCOLAX) EC tablet 5 mg (not administered)  ondansetron (ZOFRAN) tablet 4 mg (not administered)    Or  ondansetron (ZOFRAN) injection 4 mg (not administered)  insulin aspart (novoLOG) injection 0-9 Units (not administered)  nitroGLYCERIN (NITROGLYN) 2 %  ointment 1 inch (not administered)  ranolazine (RANEXA) 12 hr tablet 1,000 mg (not administered)  spironolactone (ALDACTONE) tablet 12.5 mg (not administered)  nitroGLYCERIN (NITROGLYN) 2 % ointment 1 inch (1 inch Topical Given 08/21/17 1450)    Mobility walks

## 2017-08-21 NOTE — Plan of Care (Signed)
Patient is currently relieved of chest pain. Patient was able to complete profile.

## 2017-08-21 NOTE — H&P (Signed)
[place] [facility] [place] NAME: Christopher Cummings    MR#:  [id number]  DATE OF BIRTH:  December 18, 1950  DATE OF ADMISSION:  08/21/2017  PRIMARY CARE PHYSICIAN: [name], NP   REQUESTING/REFERRING PHYSICIAN: Dr. Merlyn Lot  CHIEF COMPLAINT: Chest pain   Chief Complaint  Patient presents with  . Chest Pain    HISTORY OF PRESENT ILLNESS:  Christopher Cummings  is a 67 y.o. male with a known history of essential hypertension, diabetes mellitus type 2, chronic angina had midsternalchest pain when he was sitting for cardiology appointment with Dr. Theda Sers office.  Patient was diaphoretic and had midsternal chest pain 8 of 10 out  in severity, and radiated to left arm.  Patient received sublingual nitro, Nitropaste no in chest pain is around 4 out of 10 in severity.  EKG showed no acute changes.  Started on heparin drip.  Admitted to telemetry for unstable angina.  PAST MEDICAL HISTORY:   Past Medical History:  Diagnosis Date  . Aortic stenosis    a. 07/2015 s/p bioprosthetic AVR (#23 [employer]) - [place]; b. 04/2017 Echo: nl fxn'ing AoV.  Marland Kitchen CAD (coronary artery disease)    a. 2009 PCI->D1; b. 07/2015 CABG x 2 reported (LIMA-LAD noted on cath 01/2017) [facility] [place]; c. 2017/2018 Prox/Dist LCX & D1 stenting; d. 01/2017 PCI: ISR prox/dist LCX stents (PTCA), ISR D1 (med Rx); e. 04/2017 PCI: CBA for ISR- LCX 90p, 70d, D1 95; f. 05/2017 PCI: D1 99 ISR, LCX 70p (2.25x22 Onyx DES); g. 06/2017 Cath: Patent LCX and RCA stents. D1 100 ISR->Med Rx.  . Chronic systolic CHF (congestive heart failure) ([place])    a. 04/2017 Echo: EF 35-40% with nl functioning bioprosthetic AoV, mild MR, nl PA.  . Colitis   . Diabetes mellitus with complication ([place])   . Diverticulitis   . Essential hypertension   . Hyperlipidemia   . Ischemic cardiomyopathy    a. 04/2017 Echo: EF 35-40%.  . Permanent atrial fibrillation ([place])    a. CHA2DS2VASc = 4-->No OAC  2/2 ongoing ASA/Plavix and h/o rectal bleeding.  . Rectal bleeding    a. 03/2017 -> f/u @ [facility].    PAST SURGICAL HISTOIRY:   Past Surgical History:  Procedure Laterality Date  . AORTIC VALVE REPLACEMENT    . CORONARY ARTERY BYPASS GRAFT    . CORONARY BALLOON ANGIOPLASTY N/A 04/02/2017   Procedure: CORONARY BALLOON ANGIOPLASTY;  Surgeon: Troy Sine, MD;  Location: [place] [facility];  Service: Cardiovascular;  Laterality: N/A;  . CORONARY STENT INTERVENTION N/A 02/26/2017   Procedure: CORONARY STENT INTERVENTION;  Surgeon: Wellington Hampshire, MD;  Location: [place] [facility];  Service: Cardiovascular;  Laterality: N/A;  . CORONARY STENT INTERVENTION N/A 05/08/2017   Procedure: CORONARY STENT INTERVENTION;  Surgeon: Nelva Bush, MD;  Location: [place] [facility];  Service: Cardiovascular;  Laterality: N/A;  . CORONARY/GRAFT ANGIOGRAPHY N/A 02/26/2017   Procedure: CORONARY/GRAFT ANGIOGRAPHY;  Surgeon: Wellington Hampshire, MD;  Location: [place] [facility];  Service: Cardiovascular;  Laterality: N/A;  . CORONARY/GRAFT ANGIOGRAPHY N/A 04/02/2017   Procedure: CORONARY/GRAFT ANGIOGRAPHY;  Surgeon: Troy Sine, MD;  Location: [place] [facility];  Service: Cardiovascular;  Laterality: N/A;  . CORONARY/GRAFT ANGIOGRAPHY N/A 05/08/2017   Procedure: CORONARY/GRAFT ANGIOGRAPHY;  Surgeon: Nelva Bush, MD;  Location: [place] [facility];  Service: Cardiovascular;  Laterality: N/A;  . JOINT REPLACEMENT Left    KNEE  .  KNEE ARTHROPLASTY  2000  . LEFT HEART CATH AND CORONARY ANGIOGRAPHY N/A 06/15/2017   Procedure: LEFT HEART CATH AND CORONARY ANGIOGRAPHY;  Surgeon: Wellington Hampshire, MD;  Location: [place] [facility];  Service: Cardiovascular;  Laterality: N/A;    SOCIAL HISTORY:   Social History   Tobacco Use  . Smoking status: Never Smoker  . Smokeless tobacco: Never Used  Substance Use Topics  . Alcohol use: No    Comment: No Alcohol 12 years, but drank 2 "bottles of  liquor" each week for several years prior to quitting    FAMILY HISTORY:   Family History  Problem Relation Age of Onset  . CAD Mother   . Heart failure Mother   . Lupus Mother   . CAD Brother   . Heart attack Brother   . Prostate cancer Father   . Diabetes Sister   . Healthy Paternal Grandmother   . Prostate cancer Paternal Grandfather   . Healthy Brother   . Healthy Sister     DRUG ALLERGIES:   Allergies  Allergen Reactions  . Metformin And Related Other (See Comments)    Only the regular Metformin (diarrhea)    REVIEW OF SYSTEMS:  CONSTITUTIONAL: No fever, fatigue or weakness.  EYES: No blurred or double vision.  EARS, NOSE, AND THROAT: No tinnitus or ear pain.  RESPIRATORY: No cough, shortness of breath, wheezing or hemoptysis.  CARDIOVASCULAR: Midsternal chest pain and going to the left arm today associated with diaphoresis GASTROINTESTINAL: No nausea, vomiting, diarrhea or abdominal pain.  GENITOURINARY: No dysuria, hematuria.  ENDOCRINE: No polyuria, nocturia,  HEMATOLOGY: No anemia, easy bruising or bleeding SKIN: No rash or lesion. MUSCULOSKELETAL: No joint pain or arthritis.   NEUROLOGIC: No tingling, numbness, weakness.  PSYCHIATRY: No anxiety or depression.   MEDICATIONS AT HOME:   Prior to Admission medications   Medication Sig Start Date End Date Taking? Authorizing Provider  aspirin EC 81 MG tablet Take 1 tablet (81 mg total) by mouth daily. 06/13/17  Yes Theora Gianotti, NP  atorvastatin (LIPITOR) 80 MG tablet Take 1 tablet (80 mg total) by mouth at bedtime. 04/30/17  Yes [name], NP  clopidogrel (PLAVIX) 75 MG tablet Take 1 tablet (75 mg total) daily by mouth. 05/21/17  Yes Dunn, Areta Haber, PA-C  digoxin (LANOXIN) 0.125 MG tablet Take 0.5 tablets (0.0625 mg total) by mouth daily. 08/08/17 08/08/18 Yes Dunn, Areta Haber, PA-C  docusate sodium (COLACE) 100 MG capsule Take 1 capsule (100 mg total) by mouth 2 (two) times daily. 06/29/17  Yes  [name], NP  empagliflozin (JARDIANCE) 25 MG TABS tablet Take 25 mg daily by mouth. 05/15/17  Yes [name], NP  isosorbide mononitrate (IMDUR) 60 MG 24 hr tablet Take 1 tablet (60 mg total) by mouth daily. 06/13/17 09/11/17 Yes Theora Gianotti, NP  metFORMIN (GLUCOPHAGE-XR) 500 MG 24 hr tablet Take 1 tablet (500 mg total) daily with breakfast by mouth. 05/15/17 05/15/18 Yes [name], NP  metoprolol succinate (TOPROL-XL) 50 MG 24 hr tablet Take one tablet (50 mg) daily if systolic blood pressure is >100 06/21/17  Yes Theora Gianotti, NP  pantoprazole (PROTONIX) 40 MG tablet Take 1 tablet (40 mg total) daily by mouth. 05/21/17  Yes Dunn, Areta Haber, PA-C  predniSONE (DELTASONE) 50 MG tablet Take 1 tablet (50 mg total) by mouth daily with breakfast. 08/06/17  Yes Cuthriell, Charline Bills, PA-C  ranolazine (RANEXA) 1000 MG SR tablet Take 1 tablet (1,000  mg total) by mouth 2 (two) times daily. 07/10/17  Yes Wellington Hampshire, MD  spironolactone (ALDACTONE) 25 MG tablet Take 0.5 tablets (12.5 mg total) daily by mouth. 05/21/17 08/21/17 Yes Dunn, Areta Haber, PA-C  acetaminophen (TYLENOL) 325 MG tablet Take 2 tablets (650 mg total) by mouth every 6 (six) hours as needed for mild pain (or Fever >/= 101). 04/26/17   Nicholes Mango, MD  alum & mag hydroxide-simeth (MAALOX/MYLANTA) 200-200-20 MG/5ML suspension Take 30 mLs by mouth every 6 (six) hours as needed for indigestion or heartburn. 07/20/17   Gladstone Lighter, MD  blood glucose meter kit and supplies Dispense based on patient and insurance preference. Use 1 time daily as directed. (FOR ICD-9 250.00, 250.01). 04/30/17   [name], NP  Blood Pressure Monitoring (BLOOD PRESSURE CUFF) MISC 1 Units by Does not apply route daily. 04/30/17   [name], NP  cyclobenzaprine (FLEXERIL) 5 MG tablet Take 1 tablet (5 mg total) by mouth 3 (three) times daily as needed for muscle spasms. 07/20/17    Gladstone Lighter, MD  HYDROcodone-acetaminophen (NORCO/VICODIN) 5-325 MG tablet Take 1 tablet by mouth every 4 (four) hours as needed for moderate pain. Patient not taking: Reported on 08/21/2017 08/06/17   Cuthriell, Charline Bills, PA-C  meclizine (ANTIVERT) 25 MG tablet Take 25 mg by mouth 3 (three) times daily as needed for dizziness.    [provider]  nitroGLYCERIN (NITROSTAT) 0.4 MG SL tablet Place 1 tablet (0.4 mg total) under the tongue every 5 (five) minutes as needed for chest pain. 07/13/17   Wellington Hampshire, MD  ondansetron (ZOFRAN ODT) 4 MG disintegrating tablet Take 1 tablet (4 mg total) by mouth every 8 (eight) hours as needed for nausea or vomiting. 06/25/17   Rudene Re, MD      VITAL SIGNS:  Blood pressure 133/67, pulse 78, temperature 98 F (36.7 C), temperature source Oral, resp. rate 12, height _0  (1.753 m), weight 97.1 kg (214 lb), SpO2 98 %.  PHYSICAL EXAMINATION:  GENERAL:  67 y.o.-year-old patient lying in the bed with no acute distress.  EYES: Pupils equal, round, reactive to light and accommodation. No scleral icterus. Extraocular muscles intact.  HEENT: Head atraumatic, normocephalic. Oropharynx and nasopharynx clear.  NECK:  Supple, no jugular venous distention. No thyroid enlargement, no tenderness.  LUNGS: Normal breath sounds bilaterally, no wheezing, rales,rhonchi or crepitation. No use of accessory muscles of respiration.  CARDIOVASCULAR: S1, S2 normal. No murmurs, rubs, or gallops.  ABDOMEN: Soft, nontender, nondistended. Bowel sounds present. No organomegaly or mass.  EXTREMITIES: No pedal edema, cyanosis, or clubbing.  NEUROLOGIC: Cranial nerves II through XII are intact. Muscle strength 5/5 in all extremities. Sensation intact. Gait not checked.  PSYCHIATRIC: The patient is alert and oriented x 3.  SKIN: No obvious rash, lesion, or ulcer.   LABORATORY PANEL:   CBC Recent Labs  Lab 08/21/17 1354  WBC 10.2  HGB 15.0  HCT 44.9   PLT 239   ------------------------------------------------------------------------------------------------------------------  Chemistries  Recent Labs  Lab 08/21/17 1354  NA 134*  K 4.3  CL 98*  CO2 21*  GLUCOSE 438*  BUN 10  CREATININE 0.90  CALCIUM 9.8   ------------------------------------------------------------------------------------------------------------------  Cardiac Enzymes Recent Labs  Lab 08/21/17 1354  TROPONINI <0.03   ------------------------------------------------------------------------------------------------------------------  RADIOLOGY:  Dg Chest Portable 1 View  Result Date: 08/21/2017 CLINICAL DATA:  Chest pain. EXAM: PORTABLE CHEST 1 VIEW COMPARISON:  08/06/2017. FINDINGS: Prior median sternotomy and cardiac valve replacement. Cardiomegaly with normal  pulmonary vascularity. Low lung volumes. No pleural effusion or pneumothorax IMPRESSION: 1. Prior median sternotomy and cardiac valve replacement. Cardiomegaly with normal pulmonary vascularity. 2.  No focal infiltrate. Electronically Signed   By: Marcello Moores  Register   On: 08/21/2017 14:27    EKG:   Orders placed or performed during the hospital encounter of 08/21/17  . ED EKG within 10 minutes  . ED EKG within 10 minutes   EKG showed no ST-T changes. IMPRESSION AND PLAN:   67 year old male patient with multiple medical problems of hypertension, diabetes mellitus type 2, history of CAD status post CABG, multiple PCI's over the last year went to cardiology  office for appointment with Dr. Rockey Situ where he had additional chest pain with diaphoresis.  Patient was 2 hours of cardiac rehab and went to see cardiologist today where he developed chest pain. 1.  Chest pain and diaphoresis concerning for unstable angina: With history of CAD, CABG, multiple stents, bioprosthetic aortic valve replacement patient will be admitted to telemetry, continue aspirin, beta-blockers, statins, started on heparin drip,  patient does have Nitropaste on, cardiology is consulted.  Patient chest pain decreased with nitroglycerin.  Because of troponins, cardiology consulted, continue heparin drip, monitor on telemetry.  Patient had non-ST elevation MI November 2018 that that time he had severe narrowing and in-stent stenosis in proximal left circumflex and diagonal for which he had drug-eluting stent in diagonal patient is already on Ranexa thousand milligrams twice daily, Aldactone, beta-blockers,.  Aspirin, statin, Plavix. #2/ diabetes mellitus type 2: Hold Invokana, continue sliding scale insulin with coverage.    All the records are reviewed and case discussed with ED provider. Management plans discussed with the patient, family and they are in agreement.  CODE STATU; full code  TOTAL TIME TAKING CARE OF THIS PATIENT: 55 minutes.    Epifanio Lesches M.D on 08/21/2017 at 3:24 PM  Between 7am to 6pm - Pager - [phone]  After 6pm go to [email or web address] - password [email or web address] [facility]  [place] [facility]  Office  [phone]  CC: Primary care physician; [name], NP  Note: This dictation was prepared with Dragon dictation along with smaller phrase technology. Any transcriptional errors that result from this process are unintentional.

## 2017-08-21 NOTE — ED Notes (Signed)
Report was given to [facility]

## 2017-08-21 NOTE — ED Notes (Signed)
defib pads applied

## 2017-08-21 NOTE — ED Triage Notes (Signed)
Pt to ED from Dr Windell HummingbirdGollan's office. Pt reports he began having left sided chest pain about 30 min pta. Pt reports pain is radiating down left arm and he feels sob. Hx of MI in past and this feels similar.

## 2017-08-21 NOTE — Progress Notes (Signed)
ANTICOAGULATION CONSULT NOTE - Initial Consult  Pharmacy Consult for heparin Indication: chest pain/ACS  Allergies  Allergen Reactions  . Metformin And Related Other (See Comments)    Only the regular Metformin (diarrhea)    Patient Measurements: Height: 5\' 9"  (175.3 cm) Weight: 214 lb (97.1 kg) IBW/kg (Calculated) : 70.7 Heparin Dosing Weight: 91 kg  Vital Signs: Temp: 98.2 F (36.8 C) (02/19 1352) Temp Source: Oral (02/19 1352) BP: 156/67 (02/19 1429) Pulse Rate: 93 (02/19 1429)  Labs: Recent Labs    08/21/17 1354 08/21/17 1409  HGB 15.0  --   HCT 44.9  --   PLT 239  --   LABPROT  --  13.3  INR  --  1.02  CREATININE 0.90  --   TROPONINI <0.03  --     Estimated Creatinine Clearance: 95.4 mL/min (by C-G formula based on SCr of 0.9 mg/dL).   Medical History: Past Medical History:  Diagnosis Date  . Aortic stenosis    a. 07/2015 s/p bioprosthetic AVR (#23 [employer]) - [place]; b. 04/2017 Echo: nl fxn'ing AoV.  Marland Kitchen. CAD (coronary artery disease)    a. 2009 PCI->D1; b. 07/2015 CABG x 2 reported (LIMA-LAD noted on cath 01/2017) [facility] [place]; c. 2017/2018 Prox/Dist LCX & D1 stenting; d. 01/2017 PCI: ISR prox/dist LCX stents (PTCA), ISR D1 (med Rx); e. 04/2017 PCI: CBA for ISR- LCX 90p, 70d, D1 95; f. 05/2017 PCI: D1 99 ISR, LCX 70p (2.25x22 Onyx DES); g. 06/2017 Cath: Patent LCX and RCA stents. D1 100 ISR->Med Rx.  . Chronic systolic CHF (congestive heart failure) ([facility])    a. 04/2017 Echo: EF 35-40% with nl functioning bioprosthetic AoV, mild MR, nl PA.  . Colitis   . Diabetes mellitus with complication ([facility])   . Diverticulitis   . Essential hypertension   . Hyperlipidemia   . Ischemic cardiomyopathy    a. 04/2017 Echo: EF 35-40%.  . Permanent atrial fibrillation ([facility])    a. CHA2DS2VASc = 4-->No OAC 2/2 ongoing ASA/Plavix and h/o rectal bleeding.  . Rectal bleeding    a. 03/2017 -> f/u @ [facility].    Medications:  Infusions:  . heparin       Assessment: 66 yom from Dr. Windell HummingbirdGollan's office d/t left-sided chest pain x 30 min PTA. Hx MI and patient reports this feels similar. Initial troponin <0.03, abnormal ECG with possible anterior infarct and T wave abnormalities. Pharmacy consulted to dose UFH for ACS. No PTA OAC noted.  Goal of Therapy:  Heparin level 0.3-0.7 units/ml Monitor platelets by anticoagulation protocol: Yes   Plan:  Give 4000 units bolus x 1 Start heparin infusion at 1200 units/hr Check anti-Xa level in 6 hours and daily while on heparin Continue to monitor H&H and platelets  Carola FrostNathan A Khamila Bassinger, Pharm.D., BCPS Clinical Pharmacist 08/21/2017,2:56 PM

## 2017-08-21 NOTE — ED Notes (Signed)
Call to 2A to give report.  RN to call back

## 2017-08-21 NOTE — ED Provider Notes (Signed)
[facility] Emergency Department Provider Note    First MD Initiated Contact with Patient 08/21/17 1358     (approximate)  I have reviewed the triage vital signs and the nursing notes.   HISTORY  Chief Complaint Chest Pain    HPI Christopher Mccarter Sr. is a 67 y.o. male presents from Dr. Donivan Scull office with acute chest pain.  Patient reportedly was in cardiac rehab this morning and was otherwise feeling well until he finished this and started having crushing chest pain radiating to his left shoulder and jaw associated with diaphoresis.  Currently rating the pain is 10 out of 10 in severity.  While at the clinic he had a stat EKG performed that showed some nonspecific ST changes he was sent to the ER for further evaluation.  Denies any fevers.  No nausea or vomiting.  Denies any lower extremity swelling.  States this feels identical to his previous heart attack.  Past Medical History:  Diagnosis Date  . Aortic stenosis    a. 07/2015 s/p bioprosthetic AVR (#23 [employer]) - PA; b. 04/2017 Echo: nl fxn'ing AoV.  Marland Kitchen CAD (coronary artery disease)    a. 2009 PCI->D1; b. 07/2015 CABG x 2 reported (LIMA-LAD noted on cath 01/2017) [facility] - [place]; c. 2017/2018 Prox/Dist LCX & D1 stenting; d. 01/2017 PCI: ISR prox/dist LCX stents (PTCA), ISR D1 (med Rx); e. 04/2017 PCI: CBA for ISR- LCX 90p, 70d, D1 95; f. 05/2017 PCI: D1 99 ISR, LCX 70p (2.25x22 On[employer] DES); g. 06/2017 Cath: Patent LCX and RCA stents. D1 100 ISR->Med Rx.  . Chronic systolic CHF (congestive heart failure) ([place])    a. 04/2017 Echo: EF 35-40% with nl functioning bioprosthetic AoV, mild MR, nl PA.  . Colitis   . Diabetes mellitus with complication ([place])   . Diverticulitis   . Essential hypertension   . Hyperlipidemia   . Ischemic cardiomyopathy    a. 04/2017 Echo: EF 35-40%.  . Permanent atrial fibrillation ([place])    a. CHA2DS2VASc = 4-->No OAC 2/2 ongoing ASA/Plavix and h/o rectal bleeding.  .  Rectal bleeding    a. 03/2017 -> f/u @ [facility].   Family History  Problem Relation Age of Onset  . CAD Mother   . Heart failure Mother   . Lupus Mother   . CAD Brother   . Heart attack Brother   . Prostate cancer Father   . Diabetes Sister   . Healthy Paternal Grandmother   . Prostate cancer Paternal Grandfather   . Healthy Brother   . Healthy Sister    Past Surgical History:  Procedure Laterality Date  . AORTIC VALVE REPLACEMENT    . CORONARY ARTERY BYPASS GRAFT    . CORONARY BALLOON ANGIOPLASTY N/A 04/02/2017   Procedure: CORONARY BALLOON ANGIOPLASTY;  Surgeon: Troy Sine, MD;  Location: [place] [facility];  Service: Cardiovascular;  Laterality: N/A;  . CORONARY STENT INTERVENTION N/A 02/26/2017   Procedure: CORONARY STENT INTERVENTION;  Surgeon: Wellington Hampshire, MD;  Location: [place] [facility];  Service: Cardiovascular;  Laterality: N/A;  . CORONARY STENT INTERVENTION N/A 05/08/2017   Procedure: CORONARY STENT INTERVENTION;  Surgeon: Nelva Bush, MD;  Location: [place] [facility];  Service: Cardiovascular;  Laterality: N/A;  . CORONARY/GRAFT ANGIOGRAPHY N/A 02/26/2017   Procedure: CORONARY/GRAFT ANGIOGRAPHY;  Surgeon: Wellington Hampshire, MD;  Location: [place] [facility];  Service: Cardiovascular;  Laterality: N/A;  . CORONARY/GRAFT ANGIOGRAPHY N/A 04/02/2017   Procedure: Remus Blake  ANGIOGRAPHY;  Surgeon: Troy Sine, MD;  Location: [place] [facility];  Service: Cardiovascular;  Laterality: N/A;  . CORONARY/GRAFT ANGIOGRAPHY N/A 05/08/2017   Procedure: CORONARY/GRAFT ANGIOGRAPHY;  Surgeon: Nelva Bush, MD;  Location: [place] [facility];  Service: Cardiovascular;  Laterality: N/A;  . JOINT REPLACEMENT Left    KNEE  . KNEE ARTHROPLASTY  2000  . LEFT HEART CATH AND CORONARY ANGIOGRAPHY N/A 06/15/2017   Procedure: LEFT HEART CATH AND CORONARY ANGIOGRAPHY;  Surgeon: Wellington Hampshire, MD;  Location: [place] [facility];  Service: Cardiovascular;   Laterality: N/A;   Patient Active Problem List   Diagnosis Date Noted  . Back pain 07/20/2017  . Chest pain 07/20/2017  . Chest tightness 05/31/2017  . Gastroesophageal reflux disease 05/29/2017  . Chronic systolic heart failure ([place]) 05/12/2017  . H/O prosthetic aortic valve replacement 04/01/2017  . Unstable angina ([place]) 03/31/2017  . HTN (hypertension) 03/12/2017  . Diabetes ([place]) 03/12/2017  . CAD (coronary artery disease) 03/12/2017      Prior to Admission medications   Medication Sig Start Date End Date Taking? Authorizing Provider  aspirin EC 81 MG tablet Take 1 tablet (81 mg total) by mouth daily. 06/13/17  Yes Theora Gianotti, NP  atorvastatin (LIPITOR) 80 MG tablet Take 1 tablet (80 mg total) by mouth at bedtime. 04/30/17  Yes [name], NP  clopidogrel (PLAVIX) 75 MG tablet Take 1 tablet (75 mg total) daily by mouth. 05/21/17  Yes Dunn, Areta Haber, PA-C  digoxin (LANOXIN) 0.125 MG tablet Take 0.5 tablets (0.0625 mg total) by mouth daily. 08/08/17 08/08/18 Yes Dunn, Areta Haber, PA-C  docusate sodium (COLACE) 100 MG capsule Take 1 capsule (100 mg total) by mouth 2 (two) times daily. 06/29/17  Yes [name], NP  empagliflozin (JARDIANCE) 25 MG TABS tablet Take 25 mg daily by mouth. 05/15/17  Yes [name], NP  isosorbide mononitrate (IMDUR) 60 MG 24 hr tablet Take 1 tablet (60 mg total) by mouth daily. 06/13/17 09/11/17 Yes Theora Gianotti, NP  metFORMIN (GLUCOPHAGE-XR) 500 MG 24 hr tablet Take 1 tablet (500 mg total) daily with breakfast by mouth. 05/15/17 05/15/18 Yes [name], NP  metoprolol succinate (TOPROL-XL) 50 MG 24 hr tablet Take one tablet (50 mg) daily if systolic blood pressure is >100 06/21/17  Yes Theora Gianotti, NP  pantoprazole (PROTONIX) 40 MG tablet Take 1 tablet (40 mg total) daily by mouth. 05/21/17  Yes Dunn, Areta Haber, PA-C  predniSONE (DELTASONE) 50 MG tablet Take 1 tablet (50 mg total) by  mouth daily with breakfast. 08/06/17  Yes Cuthriell, Charline Bills, PA-C  ranolazine (RANEXA) 1000 MG SR tablet Take 1 tablet (1,000 mg total) by mouth 2 (two) times daily. 07/10/17  Yes Wellington Hampshire, MD  spironolactone (ALDACTONE) 25 MG tablet Take 0.5 tablets (12.5 mg total) daily by mouth. 05/21/17 08/21/17 Yes Dunn, Areta Haber, PA-C  acetaminophen (TYLENOL) 325 MG tablet Take 2 tablets (650 mg total) by mouth every 6 (six) hours as needed for mild pain (or Fever >/= 101). 04/26/17   Nicholes Mango, MD  alum & mag hydroxide-simeth (MAALOX/MYLANTA) 200-200-20 MG/5ML suspension Take 30 mLs by mouth every 6 (six) hours as needed for indigestion or heartburn. 07/20/17   Gladstone Lighter, MD  blood glucose meter kit and supplies Dispense based on patient and insurance preference. Use 1 time daily as directed. (FOR ICD-9 250.00, 250.01). 04/30/17   [name], NP  Blood Pressure Monitoring (BLOOD PRESSURE  CUFF) MISC 1 Units by Does not apply route daily. 04/30/17   [name], NP  cyclobenzaprine (FLEXERIL) 5 MG tablet Take 1 tablet (5 mg total) by mouth 3 (three) times daily as needed for muscle spasms. 07/20/17   Gladstone Lighter, MD  HYDROcodone-acetaminophen (NORCO/VICODIN) 5-325 MG tablet Take 1 tablet by mouth every 4 (four) hours as needed for moderate pain. Patient not taking: Reported on 08/21/2017 08/06/17   Cuthriell, Charline Bills, PA-C  meclizine (ANTIVERT) 25 MG tablet Take 25 mg by mouth 3 (three) times daily as needed for dizziness.    [provider]  nitroGLYCERIN (NITROSTAT) 0.4 MG SL tablet Place 1 tablet (0.4 mg total) under the tongue every 5 (five) minutes as needed for chest pain. 07/13/17   Wellington Hampshire, MD  ondansetron (ZOFRAN ODT) 4 MG disintegrating tablet Take 1 tablet (4 mg total) by mouth every 8 (eight) hours as needed for nausea or vomiting. 06/25/17   Rudene Re, MD    Allergies Metformin and related    Social History Social History    Tobacco Use  . Smoking status: Never Smoker  . Smokeless tobacco: Never Used  Substance Use Topics  . Alcohol use: No    Comment: No Alcohol 12 years, but drank 2 "bottles of liquor" each week for several years prior to quitting  . Drug use: No    Review of Systems Patient denies headaches, rhinorrhea, blurry vision, numbness, shortness of breath, chest pain, edema, cough, abdominal pain, nausea, vomiting, diarrhea, dysuria, fevers, rashes or hallucinations unless otherwise stated above in HPI. ____________________________________________   PHYSICAL EXAM:  VITAL SIGNS: Vitals:   08/21/17 1500 08/21/17 1522  BP: 133/67   Pulse: 77 78  Resp: 18 12  Temp:  98 F (36.7 C)  SpO2: 96% 98%    Constitutional: Alert and oriented. Ill appearing, diaphoretic Eyes: Conjunctivae are normal.  Head: Atraumatic. Nose: No congestion/rhinnorhea. Mouth/Throat: Mucous membranes are moist.   Neck: No stridor. Painless ROM.  Cardiovascular: mild tachycardia, regular rhythm.  Good peripheral circulation. Respiratory: Normal respiratory effort.  No retractions. Lungs CTAB. Gastrointestinal: Soft and nontender. No distention. No abdominal bruits. No CVA tenderness. Genitourinary:  Musculoskeletal: No lower extremity tenderness nor edema.  No joint effusions. Neurologic:  Normal speech and language. No gross focal neurologic deficits are appreciated. No facial droop Skin:  Skin is warm, dry and intact. No rash noted. Psychiatric: anxious appearing  ____________________________________________   LABS (all labs ordered are listed, but only abnormal results are displayed)  Results for orders placed or performed during the hospital encounter of 08/21/17 (from the past 24 hour(s))  Basic metabolic panel     Status: Abnormal   Collection Time: 08/21/17  1:54 PM  Result Value Ref Range   Sodium 134 (L) 135 - 145 mmol/L   Potassium 4.3 3.5 - 5.1 mmol/L   Chloride 98 (L) 101 - 111 mmol/L   CO2  21 (L) 22 - 32 mmol/L   Glucose, Bld 438 (H) 65 - 99 mg/dL   BUN 10 6 - 20 mg/dL   Creatinine, Ser 0.90 0.61 - 1.24 mg/dL   Calcium 9.8 8.9 - 10.3 mg/dL   GFR calc non Af Amer >60 >60 mL/min   GFR calc Af Amer >60 >60 mL/min   Anion gap 15 5 - 15  CBC     Status: Abnormal   Collection Time: 08/21/17  1:54 PM  Result Value Ref Range   WBC 10.2 3.8 - 10.6 K/uL  RBC 5.40 4.40 - 5.90 MIL/uL   Hemoglobin 15.0 13.0 - 18.0 g/dL   HCT 44.9 40.0 - 52.0 %   MCV 83.1 80.0 - 100.0 fL   MCH 27.7 26.0 - 34.0 pg   MCHC 33.4 32.0 - 36.0 g/dL   RDW 15.4 (H) 11.5 - 14.5 %   Platelets 239 150 - 440 K/uL  Troponin I     Status: None   Collection Time: 08/21/17  1:54 PM  Result Value Ref Range   Troponin I <0.03 <0.03 ng/mL  Protime-INR     Status: None   Collection Time: 08/21/17  2:09 PM  Result Value Ref Range   Prothrombin Time 13.3 11.4 - 15.2 seconds   INR 1.02    ____________________________________________  EKG My review and personal interpretation at Time: 13:50   Indication: chest pain  Rate: 95  Rhythm: sinus Axis: normal Other: st changes in inferior leads, no STEMI criteria,    My review and personal interpretation at Time: 14:16 Indication: chest pain  Rate: 95  Rhythm: sinus Axis: normal Other: st changes in inferior leads, no STEMI criteria,   ____________________________________________  RADIOLOGY  I personally reviewed all radiographic images ordered to evaluate for the above acute complaints and reviewed radiology reports and findings.  These findings were personally discussed with the patient.  Please see medical record for radiology report.  ____________________________________________   PROCEDURES  Procedure(s) performed:  .Critical Care Performed by: Merlyn Lot, MD Authorized by: Merlyn Lot, MD   Critical care provider statement:    Critical care time (minutes):  30   Critical care time was exclusive of:  Separately billable procedures and  treating other patients   Critical care was necessary to treat or prevent imminent or life-threatening deterioration of the following conditions:  Cardiac failure   Critical care was time spent personally by me on the following activities:  Development of treatment plan with patient or surrogate, discussions with consultants, evaluation of patient's response to treatment, examination of patient, obtaining history from patient or surrogate, ordering and performing treatments and interventions, ordering and review of laboratory studies, ordering and review of radiographic studies, pulse oximetry, re-evaluation of patient's condition and review of old charts      Critical Care performed: yes ____________________________________________   INITIAL IMPRESSION / [place] / ED COURSE  Pertinent labs & imaging results that were available during my care of the patient were reviewed by me and considered in my medical decision making (see chart for details).  DDX: ACS, pericarditis, esophagitis, boerhaaves, pe, dissection, pna, bronchitis, costochondritis   Christopher Monette Sr. is a 67 y.o. who presents to the ED with chest pain as described above.  EKG showed no dynamic changes but there is slightly change from previous.  X-ray shows no mediastinal widening or pneumothorax.  Based on his presentation I am concerned for unstable angina.  Blood work will be sent for the above differential.  Will provide IV pain medication.  He is already on aspirin and Plavix.  Clinical Course as of Aug 21 1522  Tue Aug 21, 2017  1439 Patient's pain improving now down to a 4 out of 10.  Appears much more comfortable.  We will continue with blood pressure management.  Initial troponin negative.  [PR]    Clinical Course User Index [PR] Merlyn Lot, MD   Discussed case with Dr. Saunders Revel who agrees with medical management at this time.  Patient started on heparin drip.  Symptoms and blood pressure  improving  with nitroglycerin.  We will continue to monitor.  Patient will require admission the hospital for further evaluation and management.  ____________________________________________   FINAL CLINICAL IMPRESSION(S) / ED DIAGNOSES  Final diagnoses:  Chest pain, unspecified type  HTN (hypertension), malignant  Hyperglycemia      NEW MEDICATIONS STARTED DURING THIS VISIT:  New Prescriptions   No medications on file     Note:  This document was prepared using Dragon voice recognition software and may include unintentional dictation errors.    Merlyn Lot, MD 08/21/17 1524

## 2017-08-22 ENCOUNTER — Encounter: Payer: Self-pay | Admitting: *Deleted

## 2017-08-22 ENCOUNTER — Encounter: Payer: Self-pay | Admitting: Certified Registered Nurse Anesthetist

## 2017-08-22 ENCOUNTER — Encounter
Admission: EM | Disposition: A | Discharge status: Home / Self Care | Payer: Self-pay | Source: Home / Self Care | Attending: Internal Medicine

## 2017-08-22 DIAGNOSIS — Z79899 Other long term (current) drug therapy: Secondary | ICD-10-CM | POA: Diagnosis not present

## 2017-08-22 DIAGNOSIS — E785 Hyperlipidemia, unspecified: Secondary | ICD-10-CM | POA: Diagnosis present

## 2017-08-22 DIAGNOSIS — T82855A Stenosis of coronary artery stent, initial encounter: Secondary | ICD-10-CM | POA: Diagnosis present

## 2017-08-22 DIAGNOSIS — Y831 Surgical operation with implant of artificial internal device as the cause of abnormal reaction of the patient, or of later complication, without mention of misadventure at the time of the procedure: Secondary | ICD-10-CM | POA: Diagnosis present

## 2017-08-22 DIAGNOSIS — Z888 Allergy status to other drugs, medicaments and biological substances status: Secondary | ICD-10-CM | POA: Diagnosis not present

## 2017-08-22 DIAGNOSIS — Z951 Presence of aortocoronary bypass graft: Secondary | ICD-10-CM | POA: Diagnosis not present

## 2017-08-22 DIAGNOSIS — I480 Paroxysmal atrial fibrillation: Secondary | ICD-10-CM | POA: Diagnosis present

## 2017-08-22 DIAGNOSIS — F419 Anxiety disorder, unspecified: Secondary | ICD-10-CM | POA: Diagnosis present

## 2017-08-22 DIAGNOSIS — I5022 Chronic systolic (congestive) heart failure: Secondary | ICD-10-CM | POA: Diagnosis present

## 2017-08-22 DIAGNOSIS — I481 Persistent atrial fibrillation: Secondary | ICD-10-CM | POA: Diagnosis present

## 2017-08-22 DIAGNOSIS — I214 Non-ST elevation (NSTEMI) myocardial infarction: Secondary | ICD-10-CM | POA: Diagnosis present

## 2017-08-22 DIAGNOSIS — Z7902 Long term (current) use of antithrombotics/antiplatelets: Secondary | ICD-10-CM | POA: Diagnosis not present

## 2017-08-22 DIAGNOSIS — I200 Unstable angina: Secondary | ICD-10-CM | POA: Diagnosis not present

## 2017-08-22 DIAGNOSIS — I255 Ischemic cardiomyopathy: Secondary | ICD-10-CM | POA: Diagnosis present

## 2017-08-22 DIAGNOSIS — I110 Hypertensive heart disease with heart failure: Secondary | ICD-10-CM | POA: Diagnosis present

## 2017-08-22 DIAGNOSIS — I25110 Atherosclerotic heart disease of native coronary artery with unstable angina pectoris: Secondary | ICD-10-CM | POA: Diagnosis present

## 2017-08-22 DIAGNOSIS — Z7982 Long term (current) use of aspirin: Secondary | ICD-10-CM | POA: Diagnosis not present

## 2017-08-22 DIAGNOSIS — Z7984 Long term (current) use of oral hypoglycemic drugs: Secondary | ICD-10-CM | POA: Diagnosis not present

## 2017-08-22 DIAGNOSIS — Z8249 Family history of ischemic heart disease and other diseases of the circulatory system: Secondary | ICD-10-CM | POA: Diagnosis not present

## 2017-08-22 DIAGNOSIS — Z7952 Long term (current) use of systemic steroids: Secondary | ICD-10-CM | POA: Diagnosis not present

## 2017-08-22 DIAGNOSIS — Z953 Presence of xenogenic heart valve: Secondary | ICD-10-CM | POA: Diagnosis not present

## 2017-08-22 DIAGNOSIS — I252 Old myocardial infarction: Secondary | ICD-10-CM | POA: Diagnosis not present

## 2017-08-22 DIAGNOSIS — E1165 Type 2 diabetes mellitus with hyperglycemia: Secondary | ICD-10-CM | POA: Diagnosis present

## 2017-08-22 DIAGNOSIS — Z96652 Presence of left artificial knee joint: Secondary | ICD-10-CM | POA: Diagnosis present

## 2017-08-22 HISTORY — PX: CORONARY/GRAFT ANGIOGRAPHY: CATH118237

## 2017-08-22 LAB — GLUCOSE, CAPILLARY
GLUCOSE-CAPILLARY: 119 mg/dL — AB (ref 65–99)
GLUCOSE-CAPILLARY: 181 mg/dL — AB (ref 65–99)
Glucose-Capillary: 163 mg/dL — ABNORMAL HIGH (ref 65–99)
Glucose-Capillary: 138 mg/dL — ABNORMAL HIGH (ref 65–99)
Glucose-Capillary: 177 mg/dL — ABNORMAL HIGH (ref 65–99)

## 2017-08-22 LAB — CBC
HCT: 40.5 % (ref 40.0–52.0)
Hemoglobin: 13.5 g/dL (ref 13.0–18.0)
MCH: 27.7 pg (ref 26.0–34.0)
MCHC: 33.3 g/dL (ref 32.0–36.0)
MCV: 83.2 fL (ref 80.0–100.0)
PLATELETS: 205 10*3/uL (ref 150–440)
RBC: 4.87 MIL/uL (ref 4.40–5.90)
RDW: 16.1 % — AB (ref 11.5–14.5)
WBC: 8.6 10*3/uL (ref 3.8–10.6)

## 2017-08-22 LAB — BASIC METABOLIC PANEL
Anion gap: 11 (ref 5–15)
BUN: 10 mg/dL (ref 6–20)
CALCIUM: 8.9 mg/dL (ref 8.9–10.3)
CO2: 26 mmol/L (ref 22–32)
CREATININE: 0.69 mg/dL (ref 0.61–1.24)
Chloride: 103 mmol/L (ref 101–111)
GFR calc non Af Amer: >60 mL/min (ref >60)
GLUCOSE: 152 mg/dL — AB (ref 65–99)
Potassium: 3.1 mmol/L — ABNORMAL LOW (ref 3.5–5.1)
Sodium: 140 mmol/L (ref 135–145)

## 2017-08-22 LAB — DIGOXIN LEVEL: DIGOXIN LVL: 0.5 ng/mL — AB (ref 0.8–2.0)

## 2017-08-22 LAB — TROPONIN I: Troponin I: 0.75 ng/mL (ref <0.03)

## 2017-08-22 LAB — HEPARIN LEVEL (UNFRACTIONATED): Heparin Unfractionated: 0.33 IU/mL (ref 0.30–0.70)

## 2017-08-22 SURGERY — CORONARY/GRAFT ANGIOGRAPHY
Anesthesia: Moderate Sedation

## 2017-08-22 MED ORDER — HEPARIN (PORCINE) IN NACL 2-0.9 UNIT/ML-% IJ SOLN
INTRAMUSCULAR | Status: AC
  Filled 2017-08-22: qty 500

## 2017-08-22 MED ORDER — POTASSIUM CHLORIDE CRYS ER 20 MEQ PO TBCR
40.0000 meq | EXTENDED_RELEASE_TABLET | Freq: Two times a day (BID) | ORAL | Status: AC
  Administered 2017-08-22 (×2): 40 meq via ORAL
  Filled 2017-08-22: qty 2

## 2017-08-22 MED ORDER — SODIUM CHLORIDE 0.9 % IV SOLN: INTRAVENOUS | Status: DC

## 2017-08-22 MED ORDER — ASPIRIN 81 MG PO CHEW
81.0000 mg | CHEWABLE_TABLET | Freq: Once | ORAL | Status: AC
  Administered 2017-08-22: 81 mg via ORAL

## 2017-08-22 MED ORDER — HEPARIN SODIUM (PORCINE) 1000 UNIT/ML IJ SOLN
INTRAMUSCULAR | Status: AC
  Filled 2017-08-22: qty 1

## 2017-08-22 MED ORDER — FENTANYL CITRATE (PF) 100 MCG/2ML IJ SOLN
INTRAMUSCULAR | Status: DC | PRN
  Administered 2017-08-22: 50 ug via INTRAVENOUS
  Administered 2017-08-22: 25 ug via INTRAVENOUS

## 2017-08-22 MED ORDER — SODIUM CHLORIDE 0.9% FLUSH
3.0000 mL | Freq: Two times a day (BID) | INTRAVENOUS | Status: DC
  Administered 2017-08-22: 3 mL via INTRAVENOUS

## 2017-08-22 MED ORDER — VERAPAMIL HCL 2.5 MG/ML IV SOLN
INTRAVENOUS | Status: AC
  Filled 2017-08-22: qty 2

## 2017-08-22 MED ORDER — MIDAZOLAM HCL 2 MG/2ML IJ SOLN
INTRAMUSCULAR | Status: AC
  Filled 2017-08-22: qty 2

## 2017-08-22 MED ORDER — ISOSORBIDE MONONITRATE ER 30 MG PO TB24
30.0000 mg | ORAL_TABLET | Freq: Every day | ORAL | Status: DC
  Administered 2017-08-22 – 2017-08-23 (×2): 30 mg via ORAL
  Filled 2017-08-22: qty 1

## 2017-08-22 MED ORDER — MIDAZOLAM HCL 2 MG/2ML IJ SOLN
INTRAMUSCULAR | Status: DC | PRN
  Administered 2017-08-22 (×2): 1 mg via INTRAVENOUS

## 2017-08-22 MED ORDER — IOPAMIDOL (ISOVUE-300) INJECTION 61%
INTRAVENOUS | Status: DC | PRN
  Administered 2017-08-22: 50 mL via INTRA_ARTERIAL

## 2017-08-22 MED ORDER — SODIUM CHLORIDE 0.9 % IV SOLN: 250.0000 mL | INTRAVENOUS | Status: DC | PRN

## 2017-08-22 MED ORDER — DIPHENHYDRAMINE HCL 25 MG PO CAPS
25.0000 mg | ORAL_CAPSULE | Freq: Every evening | ORAL | Status: DC | PRN
  Administered 2017-08-22: 25 mg via ORAL
  Filled 2017-08-22: qty 1

## 2017-08-22 MED ORDER — SODIUM CHLORIDE 0.9% FLUSH: 3.0000 mL | INTRAVENOUS | Status: DC | PRN

## 2017-08-22 MED ORDER — FENTANYL CITRATE (PF) 100 MCG/2ML IJ SOLN
INTRAMUSCULAR | Status: AC
  Filled 2017-08-22: qty 2

## 2017-08-22 MED ORDER — ASPIRIN 81 MG PO CHEW
CHEWABLE_TABLET | ORAL | Status: AC
  Administered 2017-08-22: 10:00:00
  Filled 2017-08-22: qty 1

## 2017-08-22 MED ORDER — ENOXAPARIN SODIUM 40 MG/0.4ML ~~LOC~~ SOLN: 40.0000 mg | SUBCUTANEOUS | Status: DC

## 2017-08-22 MED ORDER — SODIUM CHLORIDE 0.9 % IV SOLN: INTRAVENOUS | Status: AC

## 2017-08-22 MED ORDER — POTASSIUM CHLORIDE CRYS ER 20 MEQ PO TBCR
EXTENDED_RELEASE_TABLET | ORAL | Status: AC
  Administered 2017-08-22: 10:00:00
  Filled 2017-08-22: qty 2

## 2017-08-22 SURGICAL SUPPLY — 12 items
CANNULA 5F STIFF (CANNULA) ×2 IMPLANT
CATH INFINITI 5 FR IM (CATHETERS) ×2 IMPLANT
CATH INFINITI 5FR JL4 (CATHETERS) IMPLANT
CATH INFINITI JR4 5F (CATHETERS) ×2 IMPLANT
GLIDESHEATH SLEND SS 6F .021 (SHEATH) ×2 IMPLANT
KIT MANI 3VAL PERCEP (MISCELLANEOUS) ×2 IMPLANT
PACK CARDIAC CATH (CUSTOM PROCEDURE TRAY) ×2 IMPLANT
SET INTRO CAPELLA COAXIAL (SET/KITS/TRAYS/PACK) ×2 IMPLANT
SHEATH PINNACLE 5F 10CM (SHEATH) ×2 IMPLANT
WIRE EMERALD 3MM-J .035X260CM (WIRE) IMPLANT
WIRE GUIDERIGHT .035X150 (WIRE) ×2 IMPLANT
WIRE ROSEN-J .035X260CM (WIRE) ×2 IMPLANT

## 2017-08-22 NOTE — Interval H&P Note (Signed)
History and Physical Interval Note:  08/22/2017 9:52 AM  Christopher Elseharles Doughman Sr.  has presented today for cardiac catheterization, with the diagnosis of NSTEMI. The various methods of treatment have been discussed with the patient and family. After consideration of risks, benefits and other options for treatment, the patient has consented to  Procedure(s): LEFT HEART CATH AND CORONARY ANGIOGRAPHY (N/A) as a surgical intervention .  The patient's history has been reviewed, patient examined, no change in status, stable for surgery.  I have reviewed the patient's chart and labs.  Questions were answered to the patient's satisfaction.    Cath Lab Visit (complete for each Cath Lab visit)  Clinical Evaluation Leading to the Procedure:   ACS: Yes.    Non-ACS:  N/A  Galia Rahm

## 2017-08-22 NOTE — H&P (View-Only) (Signed)
Progress Note  Patient Name: Christopher Kloepfer Sr. Date of Encounter: 08/22/2017  Primary Cardiologist: Kirke Corin  Subjective   Continues to note a "sensation" in his chest that "just feels off." He cannot describe this any further. Troponin trend 0.75. K+ 4.3-->3.1. Digoxin 0.5.    Inpatient Medications    Scheduled Meds: . aspirin EC  81 mg Oral Daily  . atorvastatin  80 mg Oral QHS  . clopidogrel  75 mg Oral Daily  . digoxin  0.0625 mg Oral Daily  . docusate sodium  100 mg Oral BID  . insulin aspart  0-9 Units Subcutaneous TID WC  . metoprolol succinate  50 mg Oral Daily  . nitroGLYCERIN  1 inch Topical Q6H  . pantoprazole  40 mg Oral Daily  . ranolazine  1,000 mg Oral BID  . sodium chloride flush  3 mL Intravenous Q12H  . sodium chloride flush  3 mL Intravenous Q12H  . spironolactone  12.5 mg Oral Daily   Continuous Infusions: . sodium chloride 50 mL/hr at 08/21/17 1956  . sodium chloride    . [START ON 08/23/2017] sodium chloride    . heparin 1,200 Units/hr (08/21/17 1517)   PRN Meds: sodium chloride, acetaminophen **OR** acetaminophen, bisacodyl, diphenhydrAMINE, meclizine, nitroGLYCERIN, ondansetron **OR** ondansetron (ZOFRAN) IV, ondansetron, sodium chloride flush   Vital Signs    Vitals:   08/21/17 1836 08/21/17 2015 08/22/17 0007 08/22/17 0429  BP: 138/68 99/74 119/69 98/70  Pulse: 75 (!) 131 91 87  Resp: 18 18  18   Temp: 97.7 F (36.5 C) 98.2 F (36.8 C)  98 F (36.7 C)  TempSrc: Oral Oral  Oral  SpO2: 91% 93% 97% 94%  Weight:    211 lb 14.4 oz (96.1 kg)  Height:        Intake/Output Summary (Last 24 hours) at 08/22/2017 0805 Last data filed at 08/22/2017 0744 Gross per 24 hour  Intake 697.33 ml  Output 1600 ml  Net -902.67 ml   Filed Weights   08/21/17 1352 08/21/17 1835 08/22/17 0429  Weight: 214 lb (97.1 kg) 211 lb 12.8 oz (96.1 kg) 211 lb 14.4 oz (96.1 kg)    Telemetry    NSR - Personally Reviewed  ECG    n/a - Personally  Reviewed  Physical Exam   GEN: No acute distress.   Neck: No JVD. Cardiac: RRR, no murmurs, rubs, or gallops.  Respiratory: Clear to auscultation bilaterally.  GI: Soft, nontender, non-distended.   MS: No edema; No deformity. Neuro:  Alert and oriented x 3; Nonfocal.  Psych: Normal affect.  Labs    Chemistry Recent Labs  Lab 08/21/17 1354 08/22/17 0307  NA 134* 140  K 4.3 3.1*  CL 98* 103  CO2 21* 26  GLUCOSE 438* 152*  BUN 10 10  CREATININE 0.90 0.69  CALCIUM 9.8 8.9  GFRNONAA >60 >60  GFRAA >60 >60  ANIONGAP 15 11     Hematology Recent Labs  Lab 08/21/17 1354 08/22/17 0307  WBC 10.2 8.6  RBC 5.40 4.87  HGB 15.0 13.5  HCT 44.9 40.5  MCV 83.1 83.2  MCH 27.7 27.7  MCHC 33.4 33.3  RDW 15.4* 16.1*  PLT 239 205    Cardiac Enzymes Recent Labs  Lab 08/21/17 1354 08/21/17 1731 08/21/17 2129 08/22/17 0307  TROPONINI <0.03 0.36* 0.69* 0.75*   No results for input(s): TROPIPOC in the last 168 hours.   BNPNo results for input(s): BNP, PROBNP in the last 168 hours.   DDimer No  results for input(s): DDIMER in the last 168 hours.   Radiology    Dg Chest Portable 1 View  Result Date: 08/21/2017 IMPRESSION: 1. Prior median sternotomy and cardiac valve replacement. Cardiomegaly with normal pulmonary vascularity. 2.  No focal infiltrate. Electronically Signed   By: Maisie Fushomas  Register   On: 08/21/2017 14:27    Cardiac Studies   LHC 06/15/2017: Coronary Findings   Diagnostic  Dominance: Right  Left Main  Vessel is angiographically normal.  Left Anterior Descending  Prox LAD lesion 70% stenosed  Prox LAD lesion is 70% stenosed.  Mid LAD lesion 70% stenosed  Mid LAD lesion is 70% stenosed.  Dist LAD lesion 80% stenosed  Dist LAD lesion is 80% stenosed.  First Diagonal Branch  Ost 1st Diag to 1st Diag lesion 100% stenosed  Ost 1st Diag to 1st Diag lesion is 100% stenosed. The lesion was previously treated using a stent (unknown type) and angioplasty .  Previously placed stent displays restenosis.  Left Circumflex  Prox Cx lesion 0% stenosed  Previously placed Prox Cx drug eluting stent is widely patent.  Dist Cx lesion 40% stenosed  Dist Cx lesion is 40% stenosed. The lesion was previously treated using a stent (unknown type) and angioplasty .  First Obtuse Marginal Branch  Vessel is small in size. There is mild disease in the vessel.  Third Obtuse Marginal Branch  Ost 3rd Mrg lesion 90% stenosed  Ost 3rd Mrg lesion is 90% stenosed.  Right Coronary Artery  Prox RCA lesion 20% stenosed  Prox RCA lesion is 20% stenosed. The lesion was previously treated.  Mid RCA lesion 30% stenosed  Mid RCA lesion is 30% stenosed.  Right Posterior Descending Artery  Vessel is angiographically normal.  RPDA lesion 60% stenosed  RPDA lesion is 60% stenosed.  Right Posterior Atrioventricular Branch  There is mild disease in the vessel.  First Right Posterolateral  There is mild disease in the vessel.  Second Right Posterolateral  There is mild disease in the vessel.  Third Right Posterolateral  There is mild disease in the vessel.  Free LIMA Graft to Dist LAD  LIMA graft was not injected.  Intervention   No interventions have been documented.  Coronary Diagrams   Diagnostic Diagram         Conclusion     Mid LAD lesion is 70% stenosed.  Dist LAD lesion is 80% stenosed.  Ost 1st Diag to 1st Diag lesion is 100% stenosed.  Prox LAD lesion is 70% stenosed.  Ost 3rd Mrg lesion is 90% stenosed.  Dist Cx lesion is 40% stenosed.  Previously placed Prox Cx drug eluting stent is widely patent.  Mid RCA lesion is 30% stenosed.  LIMA graft was not injected.  Prox RCA lesion is 20% stenosed.  RPDA lesion is 60% stenosed.   1.  Significant underlying three-vessel coronary artery disease with patent stent in the RCA with only mild in-stent restenosis, patent proximal left circumflex stent with no significant restenosis, patent  distal left circumflex stent with moderate restenosis.  The LIMA to LAD is known to be patent and was not injected.  The first diagonal is completely occluded.  2.  Ascending aortogram was performed to ensure there are no other grafts were present.  This showed no evidence of patent grafts.  The prostatic aortic valve appears intact with no significant regurgitation.  Recommendations: No significant change in coronary anatomy since most recent cardiac catheterization.  Recommend continuing medical therapy for refractory angina.  His  blood pressure was low throughout the catheterization.  I am going to hold lisinopril for now.  We might need to focus on maximizing the dose of beta-blocker in order to prevent tachycardic episodes which might be contributing to his angina.  The patient can be discharged home from a cardiac standpoint.     Patient Profile     67 y.o. male with history of CAD s/p CABG and multiple PCIs (most recently 05/2017), aortic stenosis s/p bioprosthetic AVR, HTN, HLD, ischemic cardiomyopathy with chronic systolic heart failure, diabetes mellitus, and rectal bleeding who is being seen today for the evaluation of chest pain.  Assessment & Plan    1. NSTEMI: -Continues to "just feel off" -Troponin trend to 0.75 to date, continue to cycle until peak -For LHC this morning -Heparin gtt -DAPT with ASA and Plavix -Metoprolol -Ranexa -Risks and benefits of cardiac catheterization have been discussed with the patient including risks of bleeding, bruising, infection, kidney damage, stroke, heart attack, and death. The patient understands these risks and is willing to proceed with the procedure. All questions have been answered and concerns listened to  2. CAD: -As above  3. ICM: -He does not appear grossly volume overloaded -Continue metoprolol, spironolactone, and digoxin -Not on ACEi/ARB due to relative hypotension  4. Persistent Afib: -Remains in sinus  rhythm -Metoprolol, digoxin, heparin -Long term anticoagulation has been deferred due to GI bleed in the past with ongoing DAPT  5. HLD: -Lipitor -Goal LDL < 70  6. HTN: -Improved -Continue current medications  7. DM2: -Per IM   For questions or updates, please contact [facility] Please consult [email or web address] for contact info under Cardiology/STEMI.    Signed, Eula Listen, PA-C [facility] Pager: [phone] 08/22/2017, 8:05 AM

## 2017-08-22 NOTE — Progress Notes (Signed)
[facility] [place] [facility]   PATIENT NAME: Christopher ElseCharles Cummings    MR#:  [id number]  DATE OF BIRTH:  02-13-1951  SUBJECTIVE: Seen at the bedside, admitted for chest pain and found to have non-ST elevation MI.  Scheduled for cardiac cath.  On heparin drip.  CHIEF COMPLAINT:   Chief Complaint  Patient presents with  . Chest Pain    REVIEW OF SYSTEMS:   ROS CONSTITUTIONAL: Has fatigue. EYES: No blurred or double vision.  EARS, NOSE, AND THROAT: No tinnitus or ear pain.  RESPIRATORY: No cough, shortness of breath, wheezing or hemoptysis.  CARDIOVASCULAR: No chest pain, orthopnea, edema.  GASTROINTESTINAL: No nausea, vomiting, diarrhea or abdominal pain.  GENITOURINARY: No dysuria, hematuria.  ENDOCRINE: No polyuria, nocturia,  HEMATOLOGY: No anemia, easy bruising or bleeding SKIN: No rash or lesion. MUSCULOSKELETAL: No joint pain or arthritis.   NEUROLOGIC: No tingling, numbness, weakness.  PSYCHIATRY: No anxiety or depression.   DRUG ALLERGIES:   Allergies  Allergen Reactions  . Metformin And Related Other (See Comments)    Only the regular Metformin (diarrhea)    VITALS:  Blood pressure 123/64, pulse 72, temperature 97.8 F (36.6 C), temperature source Oral, resp. rate 13, height 5\' 9"  (1.753 m), weight 96.1 kg (211 lb 14.4 oz), SpO2 97 %.  PHYSICAL EXAMINATION:  GENERAL:  67 y.o.-year-old patient lying in the bed with no acute distress.  EYES: Pupils equal, round, reactive to light and accommodation. No scleral icterus. Extraocular muscles intact.  HEENT: Head atraumatic, normocephalic. Oropharynx and nasopharynx clear.  NECK:  Supple, no jugular venous distention. No thyroid enlargement, no tenderness.  LUNGS: Normal breath sounds bilaterally, no wheezing, rales,rhonchi or crepitation. No use of accessory muscles of respiration.  CARDIOVASCULAR: S1, S2 normal. No murmurs, rubs, or gallops.  ABDOMEN: Soft, nontender, nondistended. Bowel  sounds present. No organomegaly or mass.  EXTREMITIES: No pedal edema, cyanosis, or clubbing.  NEUROLOGIC: Cranial nerves II through XII are intact. Muscle strength 5/5 in all extremities. Sensation intact. Gait not checked.  PSYCHIATRIC: The patient is alert and oriented x 3.  SKIN: No obvious rash, lesion, or ulcer.    LABORATORY PANEL:   CBC Recent Labs  Lab 08/22/17 0307  WBC 8.6  HGB 13.5  HCT 40.5  PLT 205   ------------------------------------------------------------------------------------------------------------------  Chemistries  Recent Labs  Lab 08/22/17 0307  NA 140  K 3.1*  CL 103  CO2 26  GLUCOSE 152*  BUN 10  CREATININE 0.69  CALCIUM 8.9   ------------------------------------------------------------------------------------------------------------------  Cardiac Enzymes Recent Labs  Lab 08/22/17 0307  TROPONINI 0.75*   ------------------------------------------------------------------------------------------------------------------  RADIOLOGY:  Dg Chest Portable 1 View  Result Date: 08/21/2017 CLINICAL DATA:  Chest pain. EXAM: PORTABLE CHEST 1 VIEW COMPARISON:  08/06/2017. FINDINGS: Prior median sternotomy and cardiac valve replacement. Cardiomegaly with normal pulmonary vascularity. Low lung volumes. No pleural effusion or pneumothorax IMPRESSION: 1. Prior median sternotomy and cardiac valve replacement. Cardiomegaly with normal pulmonary vascularity. 2.  No focal infiltrate. Electronically Signed   By: Maisie Fushomas  Register   On: 08/21/2017 14:27    EKG:   Orders placed or performed during the hospital encounter of 08/21/17  . ED EKG within 10 minutes  . ED EKG within 10 minutes    ASSESSMENT AND PLAN:  67 year old male patient with history of extensive coronary artery disease CABG, multiple PCI's left-sided chest pain, diaphoresis: Admitted for unstable angina.  Patient has been taken to cardiac cath, cardiac cath revealed essentially no new  changes  with patent stents, chronically occluded diagonal stent, patent bypass grafts.  Medical management advised, monitor overnight today, continue dual antiplatelet therapy, Imdur, Ranexa. 2.  Essential hypertension: Continue beta-blockers, ACE inhibitors. 3.  Hyperlipidemia: Continue statins 4.  Diabetes mellitus type 2: Uncontrolled, check hemoglobin A1c, seen by diabetic nurse.;continue SSI WITH COVERAGE     All the records are reviewed and case discussed with Care Management/Social Workerr. Management plans discussed with the patient, family and they are in agreement.  CODE STATUS:full  TOTAL TIME TAKING CARE OF THIS PATIENT: .   POSSIBLE D/C IN 1-2 DAYS, DEPENDING ON CLINICAL CONDITION.   Katha Hamming M.D on 08/22/2017 at 1:07 PM  Between 7am to 6pm - Pager - [phone]  After 6pm go to [email or web address] - password [email or web address] [facility]  [place] [facility]  Office  [phone]  CC: Primary care physician; Galen Manila, NP   Note: This dictation was prepared with Dragon dictation along with smaller phrase technology. Any transcriptional errors that result from this process are unintentional.

## 2017-08-22 NOTE — Care Management (Signed)
Patient has had 9 admissions for cardiac sx.  Presented from cardiology office with chest pain.  Elevated troponins. Extensive cardiac history- CABG and multiple stents. Cath did not show any new occlusions. It appears patient is compliant with cardiac regimen.

## 2017-08-22 NOTE — Progress Notes (Signed)
Inpatient Diabetes Program Recommendations  AACE/ADA: New Consensus Statement on Inpatient Glycemic Control (2015)  Target Ranges:  Prepandial:   less than 140 mg/dL      Peak postprandial:   less than 180 mg/dL (1-2 hours)      Critically ill patients:  140 - 180 mg/dL   Results for Christopher Cummings, Christopher Cummings. (MRN [id number]) as of 08/22/2017 10:53  Ref. Range 05/07/2017 15:07  Hemoglobin A1C Latest Ref Range: 4.8 - 5.6 % 9.3 (H)   Results for Christopher Cummings, Christopher Cummings. (MRN [id number]) as of 08/22/2017 10:53  Ref. Range 08/21/2017 17:56 08/21/2017 20:53 08/22/2017 08:18  Glucose-Capillary Latest Ref Range: 65 - 99 mg/dL 782243 (H)  3 units Novolog 222 (H) 163 (H)  2 units Novolog    Admit with: NSTEMI  History: DM  Home DM Meds: Metformin 500 mg QAM       Jardiance 25 mg daily  Current Insulin Orders: Novolog Sensitive Correction Scale/ SSI (0-9 units) TID AC      MD- Please consider placing orders for current Hemoglobin A1c level  Last one on file was 9.3% back on 05/07/17     --Will follow patient during hospitalization--  Ambrose FinlandJeannine Johnston Gusta Marksberry RN, MSN, CDE Diabetes Coordinator Inpatient Glycemic Control Team Team Pager: [phone] (8a-5p)

## 2017-08-22 NOTE — Progress Notes (Signed)
Progress Note  Patient Name: Christopher Kloepfer Sr. Date of Encounter: 08/22/2017  Primary Cardiologist: Kirke Corin  Subjective   Continues to note a "sensation" in his chest that "just feels off." He cannot describe this any further. Troponin trend 0.75. K+ 4.3-->3.1. Digoxin 0.5.    Inpatient Medications    Scheduled Meds: . aspirin EC  81 mg Oral Daily  . atorvastatin  80 mg Oral QHS  . clopidogrel  75 mg Oral Daily  . digoxin  0.0625 mg Oral Daily  . docusate sodium  100 mg Oral BID  . insulin aspart  0-9 Units Subcutaneous TID WC  . metoprolol succinate  50 mg Oral Daily  . nitroGLYCERIN  1 inch Topical Q6H  . pantoprazole  40 mg Oral Daily  . ranolazine  1,000 mg Oral BID  . sodium chloride flush  3 mL Intravenous Q12H  . sodium chloride flush  3 mL Intravenous Q12H  . spironolactone  12.5 mg Oral Daily   Continuous Infusions: . sodium chloride 50 mL/hr at 08/21/17 1956  . sodium chloride    . [START ON 08/23/2017] sodium chloride    . heparin 1,200 Units/hr (08/21/17 1517)   PRN Meds: sodium chloride, acetaminophen **OR** acetaminophen, bisacodyl, diphenhydrAMINE, meclizine, nitroGLYCERIN, ondansetron **OR** ondansetron (ZOFRAN) IV, ondansetron, sodium chloride flush   Vital Signs    Vitals:   08/21/17 1836 08/21/17 2015 08/22/17 0007 08/22/17 0429  BP: 138/68 99/74 119/69 98/70  Pulse: 75 (!) 131 91 87  Resp: 18 18  18   Temp: 97.7 F (36.5 C) 98.2 F (36.8 C)  98 F (36.7 C)  TempSrc: Oral Oral  Oral  SpO2: 91% 93% 97% 94%  Weight:    211 lb 14.4 oz (96.1 kg)  Height:        Intake/Output Summary (Last 24 hours) at 08/22/2017 0805 Last data filed at 08/22/2017 0744 Gross per 24 hour  Intake 697.33 ml  Output 1600 ml  Net -902.67 ml   Filed Weights   08/21/17 1352 08/21/17 1835 08/22/17 0429  Weight: 214 lb (97.1 kg) 211 lb 12.8 oz (96.1 kg) 211 lb 14.4 oz (96.1 kg)    Telemetry    NSR - Personally Reviewed  ECG    n/a - Personally  Reviewed  Physical Exam   GEN: No acute distress.   Neck: No JVD. Cardiac: RRR, no murmurs, rubs, or gallops.  Respiratory: Clear to auscultation bilaterally.  GI: Soft, nontender, non-distended.   MS: No edema; No deformity. Neuro:  Alert and oriented x 3; Nonfocal.  Psych: Normal affect.  Labs    Chemistry Recent Labs  Lab 08/21/17 1354 08/22/17 0307  NA 134* 140  K 4.3 3.1*  CL 98* 103  CO2 21* 26  GLUCOSE 438* 152*  BUN 10 10  CREATININE 0.90 0.69  CALCIUM 9.8 8.9  GFRNONAA >60 >60  GFRAA >60 >60  ANIONGAP 15 11     Hematology Recent Labs  Lab 08/21/17 1354 08/22/17 0307  WBC 10.2 8.6  RBC 5.40 4.87  HGB 15.0 13.5  HCT 44.9 40.5  MCV 83.1 83.2  MCH 27.7 27.7  MCHC 33.4 33.3  RDW 15.4* 16.1*  PLT 239 205    Cardiac Enzymes Recent Labs  Lab 08/21/17 1354 08/21/17 1731 08/21/17 2129 08/22/17 0307  TROPONINI <0.03 0.36* 0.69* 0.75*   No results for input(s): TROPIPOC in the last 168 hours.   BNPNo results for input(s): BNP, PROBNP in the last 168 hours.   DDimer No  results for input(s): DDIMER in the last 168 hours.   Radiology    Dg Chest Portable 1 View  Result Date: 08/21/2017 IMPRESSION: 1. Prior median sternotomy and cardiac valve replacement. Cardiomegaly with normal pulmonary vascularity. 2.  No focal infiltrate. Electronically Signed   By: Maisie Fushomas  Register   On: 08/21/2017 14:27    Cardiac Studies   LHC 06/15/2017: Coronary Findings   Diagnostic  Dominance: Right  Left Main  Vessel is angiographically normal.  Left Anterior Descending  Prox LAD lesion 70% stenosed  Prox LAD lesion is 70% stenosed.  Mid LAD lesion 70% stenosed  Mid LAD lesion is 70% stenosed.  Dist LAD lesion 80% stenosed  Dist LAD lesion is 80% stenosed.  First Diagonal Branch  Ost 1st Diag to 1st Diag lesion 100% stenosed  Ost 1st Diag to 1st Diag lesion is 100% stenosed. The lesion was previously treated using a stent (unknown type) and angioplasty .  Previously placed stent displays restenosis.  Left Circumflex  Prox Cx lesion 0% stenosed  Previously placed Prox Cx drug eluting stent is widely patent.  Dist Cx lesion 40% stenosed  Dist Cx lesion is 40% stenosed. The lesion was previously treated using a stent (unknown type) and angioplasty .  First Obtuse Marginal Branch  Vessel is small in size. There is mild disease in the vessel.  Third Obtuse Marginal Branch  Ost 3rd Mrg lesion 90% stenosed  Ost 3rd Mrg lesion is 90% stenosed.  Right Coronary Artery  Prox RCA lesion 20% stenosed  Prox RCA lesion is 20% stenosed. The lesion was previously treated.  Mid RCA lesion 30% stenosed  Mid RCA lesion is 30% stenosed.  Right Posterior Descending Artery  Vessel is angiographically normal.  RPDA lesion 60% stenosed  RPDA lesion is 60% stenosed.  Right Posterior Atrioventricular Branch  There is mild disease in the vessel.  First Right Posterolateral  There is mild disease in the vessel.  Second Right Posterolateral  There is mild disease in the vessel.  Third Right Posterolateral  There is mild disease in the vessel.  Free LIMA Graft to Dist LAD  LIMA graft was not injected.  Intervention   No interventions have been documented.  Coronary Diagrams   Diagnostic Diagram         Conclusion     Mid LAD lesion is 70% stenosed.  Dist LAD lesion is 80% stenosed.  Ost 1st Diag to 1st Diag lesion is 100% stenosed.  Prox LAD lesion is 70% stenosed.  Ost 3rd Mrg lesion is 90% stenosed.  Dist Cx lesion is 40% stenosed.  Previously placed Prox Cx drug eluting stent is widely patent.  Mid RCA lesion is 30% stenosed.  LIMA graft was not injected.  Prox RCA lesion is 20% stenosed.  RPDA lesion is 60% stenosed.   1.  Significant underlying three-vessel coronary artery disease with patent stent in the RCA with only mild in-stent restenosis, patent proximal left circumflex stent with no significant restenosis, patent  distal left circumflex stent with moderate restenosis.  The LIMA to LAD is known to be patent and was not injected.  The first diagonal is completely occluded.  2.  Ascending aortogram was performed to ensure there are no other grafts were present.  This showed no evidence of patent grafts.  The prostatic aortic valve appears intact with no significant regurgitation.  Recommendations: No significant change in coronary anatomy since most recent cardiac catheterization.  Recommend continuing medical therapy for refractory angina.  His  blood pressure was low throughout the catheterization.  I am going to hold lisinopril for now.  We might need to focus on maximizing the dose of beta-blocker in order to prevent tachycardic episodes which might be contributing to his angina.  The patient can be discharged home from a cardiac standpoint.     Patient Profile     67 y.o. male with history of CAD s/p CABG and multiple PCIs (most recently 05/2017), aortic stenosis s/p bioprosthetic AVR, HTN, HLD, ischemic cardiomyopathy with chronic systolic heart failure, diabetes mellitus, and rectal bleeding who is being seen today for the evaluation of chest pain.  Assessment & Plan    1. NSTEMI: -Continues to "just feel off" -Troponin trend to 0.75 to date, continue to cycle until peak -For LHC this morning -Heparin gtt -DAPT with ASA and Plavix -Metoprolol -Ranexa -Risks and benefits of cardiac catheterization have been discussed with the patient including risks of bleeding, bruising, infection, kidney damage, stroke, heart attack, and death. The patient understands these risks and is willing to proceed with the procedure. All questions have been answered and concerns listened to  2. CAD: -As above  3. ICM: -He does not appear grossly volume overloaded -Continue metoprolol, spironolactone, and digoxin -Not on ACEi/ARB due to relative hypotension  4. Persistent Afib: -Remains in sinus  rhythm -Metoprolol, digoxin, heparin -Long term anticoagulation has been deferred due to GI bleed in the past with ongoing DAPT  5. HLD: -Lipitor -Goal LDL < 70  6. HTN: -Improved -Continue current medications  7. DM2: -Per IM   For questions or updates, please contact [facility] Please consult [email or web address] for contact info under Cardiology/STEMI.    Signed, Eula Listen, PA-C [facility] Pager: [phone] 08/22/2017, 8:05 AM

## 2017-08-23 ENCOUNTER — Telehealth: Payer: Self-pay | Admitting: Cardiovascular Disease

## 2017-08-23 DIAGNOSIS — I200 Unstable angina: Secondary | ICD-10-CM

## 2017-08-23 LAB — BASIC METABOLIC PANEL
Anion gap: 10 (ref 5–15)
BUN: 12 mg/dL (ref 6–20)
CALCIUM: 9 mg/dL (ref 8.9–10.3)
CO2: 26 mmol/L (ref 22–32)
CREATININE: 0.77 mg/dL (ref 0.61–1.24)
Chloride: 104 mmol/L (ref 101–111)
GFR calc Af Amer: >60 mL/min (ref >60)
GFR calc non Af Amer: >60 mL/min (ref >60)
GLUCOSE: 152 mg/dL — AB (ref 65–99)
Potassium: 4.2 mmol/L (ref 3.5–5.1)
Sodium: 140 mmol/L (ref 135–145)

## 2017-08-23 LAB — CBC
HEMATOCRIT: 39.6 % — AB (ref 40.0–52.0)
Hemoglobin: 13.2 g/dL (ref 13.0–18.0)
MCH: 27.8 pg (ref 26.0–34.0)
MCHC: 33.4 g/dL (ref 32.0–36.0)
MCV: 83.2 fL (ref 80.0–100.0)
Platelets: 194 10*3/uL (ref 150–440)
RBC: 4.76 MIL/uL (ref 4.40–5.90)
RDW: 15.5 % — AB (ref 11.5–14.5)
WBC: 8.1 10*3/uL (ref 3.8–10.6)

## 2017-08-23 LAB — GLUCOSE, CAPILLARY
Glucose-Capillary: 134 mg/dL — ABNORMAL HIGH (ref 65–99)
Glucose-Capillary: 215 mg/dL — ABNORMAL HIGH (ref 65–99)

## 2017-08-23 MED ORDER — ALPRAZOLAM 0.25 MG PO TABS
0.2500 mg | ORAL_TABLET | Freq: Once | ORAL | Status: AC
  Administered 2017-08-23: 0.25 mg via ORAL
  Filled 2017-08-23: qty 1

## 2017-08-23 MED ORDER — HYDROCODONE-ACETAMINOPHEN 5-325 MG PO TABS
1.0000 | ORAL_TABLET | Freq: Once | ORAL | Status: AC
  Administered 2017-08-23: 1 via ORAL
  Filled 2017-08-23: qty 1

## 2017-08-23 MED ORDER — MORPHINE SULFATE (PF) 2 MG/ML IV SOLN
2.0000 mg | INTRAVENOUS | Status: DC | PRN
  Administered 2017-08-23: 2 mg via INTRAVENOUS

## 2017-08-23 MED ORDER — ALPRAZOLAM 0.25 MG PO TABS: 0.2500 mg | ORAL_TABLET | Freq: Every evening | ORAL | 0 refills | Status: AC | PRN

## 2017-08-23 MED ORDER — MORPHINE SULFATE (PF) 2 MG/ML IV SOLN
INTRAVENOUS | Status: AC
  Administered 2017-08-23: 2 mg via INTRAVENOUS
  Filled 2017-08-23: qty 1

## 2017-08-23 MED ORDER — HYDROCODONE-ACETAMINOPHEN 5-325 MG PO TABS: 1.0000 | ORAL_TABLET | ORAL | 0 refills | Status: AC | PRN

## 2017-08-23 NOTE — Progress Notes (Signed)
Pt complaining of 8/10 chest pain. MD notified. One time dose of vicodin and one time dose of xanax. I will continue to assess.

## 2017-08-23 NOTE — Progress Notes (Signed)
Dr Sheryle Hailiamond made aware and orders given.

## 2017-08-23 NOTE — Plan of Care (Signed)
Chest pressure relieved after IV Morphine.

## 2017-08-23 NOTE — Telephone Encounter (Signed)
TCM....  Patient is being discharged later today   They saw Christopher Cummings in hospital   They are scheduled to see Christopher Cummings on 08/30/17  They were seen for CP   They need to be seen within 1 Week    Please call

## 2017-08-23 NOTE — Telephone Encounter (Signed)
Admitted

## 2017-08-23 NOTE — Progress Notes (Signed)
Pt reports ongoing chest pressure sine 2045.  Nitroglycerin tab given per previous RN.  VSS.  Nitroglycerin sl X 3 tabs given with stable blood pressure.  Patient reports pressure relived from 4 to 2 on scale of 1 to 10.  EKG done.  Nonspecific ST and T wave abnormality.  No EKG in chart at this time for comparison.  MD paged.

## 2017-08-23 NOTE — Discharge Summary (Signed)
Christopher Russman Sr., is a 67 y.o. male  DOB 1950/10/03  MRN [id number].  Admission date:  08/21/2017  Admitting Physician  Epifanio Lesches, MD  Discharge Date:  08/23/2017   Primary MD  [name], NP  Recommendations for primary care physician for things to follow:   Follow-up with PCP in 1 week Follow-up with [facility] health cardiology in 1-2 weeks.   Admission Diagnosis  Hyperglycemia [R73.9] HTN (hypertension), malignant [I10] Chest pain, unspecified type [R07.9]   Discharge Diagnosis  Hyperglycemia [R73.9] HTN (hypertension), malignant [I10] Chest pain, unspecified type [R07.9]    Active Problems:   Chest pain   NSTEMI (non-ST elevated myocardial infarction) [facility])      Past Medical History:  Diagnosis Date  . Aortic stenosis    a. 07/2015 s/p bioprosthetic AVR (#23 [employer]) - [place]; b. 04/2017 Echo: nl fxn'ing AoV.  Marland Kitchen CAD (coronary artery disease)    a. 2009 PCI->D1; b. 07/2015 CABG x 2 reported (LIMA-LAD noted on cath 01/2017) [facility] - [place]; c. 2017/2018 Prox/Dist LCX & D1 stenting; d. 01/2017 PCI: ISR prox/dist LCX stents (PTCA), ISR D1 (med Rx); e. 04/2017 PCI: CBA for ISR- LCX 90p, 70d, D1 95; f. 05/2017 PCI: D1 99 ISR, LCX 70p (2.25x22 On[employer] DES); g. 06/2017 Cath: Patent LCX and RCA stents. D1 100 ISR->Med Rx.  . Chronic systolic CHF (congestive heart failure) ([place])    a. 04/2017 Echo: EF 35-40% with nl functioning bioprosthetic AoV, mild MR, nl PA.  . Colitis   . Diabetes mellitus with complication ([place])   . Diverticulitis   . Essential hypertension   . Hyperlipidemia   . Ischemic cardiomyopathy    a. 04/2017 Echo: EF 35-40%.  . Permanent atrial fibrillation ([employer])    a. CHA2DS2VASc = 4-->No OAC 2/2 ongoing ASA/Plavix and h/o rectal bleeding.  . Rectal bleeding    a. 03/2017 ->  f/u @ [facility].    Past Surgical History:  Procedure Laterality Date  . AORTIC VALVE REPLACEMENT    . CORONARY ARTERY BYPASS GRAFT    . CORONARY BALLOON ANGIOPLASTY N/A 04/02/2017   Procedure: CORONARY BALLOON ANGIOPLASTY;  Surgeon: Troy Sine, MD;  Location: [place] CV [facility];  Service: Cardiovascular;  Laterality: N/A;  . CORONARY STENT INTERVENTION N/A 02/26/2017   Procedure: CORONARY STENT INTERVENTION;  Surgeon: Wellington Hampshire, MD;  Location: [place] [facility];  Service: Cardiovascular;  Laterality: N/A;  . CORONARY STENT INTERVENTION N/A 05/08/2017   Procedure: CORONARY STENT INTERVENTION;  Surgeon: Nelva Bush, MD;  Location: [place] [facility];  Service: Cardiovascular;  Laterality: N/A;  . CORONARY/GRAFT ANGIOGRAPHY N/A 02/26/2017   Procedure: CORONARY/GRAFT ANGIOGRAPHY;  Surgeon: Wellington Hampshire, MD;  Location: [place] [facility];  Service: Cardiovascular;  Laterality: N/A;  . CORONARY/GRAFT ANGIOGRAPHY N/A 04/02/2017   Procedure: CORONARY/GRAFT ANGIOGRAPHY;  Surgeon: Troy Sine, MD;  Location: [place] [facility];  Service: Cardiovascular;  Laterality: N/A;  . CORONARY/GRAFT ANGIOGRAPHY N/A 05/08/2017   Procedure: CORONARY/GRAFT ANGIOGRAPHY;  Surgeon: Nelva Bush, MD;  Location: [place] [facility];  Service: Cardiovascular;  Laterality: N/A;  . CORONARY/GRAFT ANGIOGRAPHY N/A 08/22/2017   Procedure: CORONARY/GRAFT ANGIOGRAPHY;  Surgeon: Nelva Bush, MD;  Location: [place] [facility];  Service: Cardiovascular;  Laterality: N/A;  . JOINT REPLACEMENT Left    KNEE  . KNEE ARTHROPLASTY  2000  . LEFT HEART CATH AND CORONARY ANGIOGRAPHY N/A 06/15/2017   Procedure: LEFT HEART CATH AND CORONARY ANGIOGRAPHY;  Surgeon: Fletcher Anon,  Mertie Clause, MD;  Location: [place] [facility];  Service: Cardiovascular;  Laterality: N/A;       History of present illness and  Hospital Course:     Kindly see H&P for history of present illness and admission details, please  review complete Labs, Consult reports and Test reports for all details in brief  HPI  from the history and physical done on the day of admission 67 year old male patient with history of for coronary artery disease status post CABG, multiple PCI's, essential hypertension, hyperlipidemia, chronic systolic heart failure, rectal bleed admitted because of left-sided chest pain with diaphoresis   Hospital Course   .  #1 unstable angina with minor troponin elevation.  Admitted to telemetry.  Initially received IV heparin drip, Nitropaste.  Seen by [facility] health cardiology, patient had cardiac cath again, cardiac cath showed small vessel disease with no significant change in coronary anatomy with patent stents, bypass grafts are patent.  Cardiology recommended aggressive medical therapy.  Patient already on aspirin, Plavix, Ranexa, beta-blockers, Aldactone and patient already on Imdur at home.  [place] cardiology I spoke with Dr. Fletcher Anon today recommended continuing present medication.  Same discussed with patient.  There is a small component of anxiety and possible panic attacks.  So I told him he can take some Xanax as needed.  Patient also received morphine that helped with chest pain.  Patient admitted 9 times in the last 6 months and had numerous workup including repeated heart caths.  2.  Proximal atrial fibrillation: Maintaining sinus rhythm.  Continue aspirin, Plavix.  #3 history of ischemic cardiomyopathy: Continue beta-blockers, Aldactone, follow up with [facility] health cardiology to see if his inhibitors can be added.  As an outpatient.  Patient advised to continue cardiac rehab.  Discharge Condition: Stable   Follow UP  Follow-up Information    [name], NP. Go on 08/31/2017.   Specialty:  Nurse Practitioner Why:  Appointment Time: 9:20am Contact information: [address] [place] [address] [phone]        Wellington Hampshire, MD. Go on 08/30/2017.   Specialty:   Cardiology Why:  Appointment Time: 10:00am with Laurine Blazer Contact information: [place] [address] [phone]             Discharge Instructions  and  Discharge Medications    Allergies as of 08/23/2017      Reactions   Metformin And Related Other (See Comments)   Only the regular Metformin (diarrhea)      Medication List    TAKE these medications   acetaminophen 325 MG tablet Commonly known as:  TYLENOL Take 2 tablets (650 mg total) by mouth every 6 (six) hours as needed for mild pain (or Fever >/= 101).   ALPRAZolam 0.25 MG tablet Commonly known as:  XANAX Take 1 tablet (0.25 mg total) by mouth at bedtime as needed for anxiety.   alum & mag hydroxide-simeth 200-200-20 MG/5ML suspension Commonly known as:  MAALOX/MYLANTA Take 30 mLs by mouth every 6 (six) hours as needed for indigestion or heartburn.   aspirin EC 81 MG tablet Take 1 tablet (81 mg total) by mouth daily.   atorvastatin 80 MG tablet Commonly known as:  LIPITOR Take 1 tablet (80 mg total) by mouth at bedtime.   blood glucose meter kit and supplies Dispense based on patient and insurance preference. Use 1 time daily as directed. (FOR ICD-9 250.00, 250.01).   Blood Pressure Cuff Misc 1 Units  by Does not apply route daily.   clopidogrel 75 MG tablet Commonly known as:  PLAVIX Take 1 tablet (75 mg total) daily by mouth.   cyclobenzaprine 5 MG tablet Commonly known as:  FLEXERIL Take 1 tablet (5 mg total) by mouth 3 (three) times daily as needed for muscle spasms.   digoxin 0.125 MG tablet Commonly known as:  LANOXIN Take 0.5 tablets (0.0625 mg total) by mouth daily.   docusate sodium 100 MG capsule Commonly known as:  COLACE Take 1 capsule (100 mg total) by mouth 2 (two) times daily.   empagliflozin 25 MG Tabs tablet Commonly known as:  JARDIANCE Take 25 mg daily by mouth.   HYDROcodone-acetaminophen 5-325 MG tablet Commonly known as:   NORCO/VICODIN Take 1 tablet by mouth every 4 (four) hours as needed for moderate pain.   isosorbide mononitrate 60 MG 24 hr tablet Commonly known as:  IMDUR Take 1 tablet (60 mg total) by mouth daily.   meclizine 25 MG tablet Commonly known as:  ANTIVERT Take 25 mg by mouth 3 (three) times daily as needed for dizziness.   metFORMIN 500 MG 24 hr tablet Commonly known as:  GLUCOPHAGE-XR Take 1 tablet (500 mg total) daily with breakfast by mouth.   metoprolol succinate 50 MG 24 hr tablet Commonly known as:  TOPROL-XL Take one tablet (50 mg) daily if systolic blood pressure is >100   nitroGLYCERIN 0.4 MG SL tablet Commonly known as:  NITROSTAT Place 1 tablet (0.4 mg total) under the tongue every 5 (five) minutes as needed for chest pain.   ondansetron 4 MG disintegrating tablet Commonly known as:  ZOFRAN ODT Take 1 tablet (4 mg total) by mouth every 8 (eight) hours as needed for nausea or vomiting.   pantoprazole 40 MG tablet Commonly known as:  PROTONIX Take 1 tablet (40 mg total) daily by mouth.   predniSONE 50 MG tablet Commonly known as:  DELTASONE Take 1 tablet (50 mg total) by mouth daily with breakfast.   ranolazine 1000 MG SR tablet Commonly known as:  RANEXA Take 1 tablet (1,000 mg total) by mouth 2 (two) times daily.   spironolactone 25 MG tablet Commonly known as:  ALDACTONE Take 0.5 tablets (12.5 mg total) daily by mouth.         Diet and Activity recommendation: See Discharge Instructions above   Consults obtained -cardiology   Major procedures and Radiology Reports - PLEASE review detailed and final reports for all details, in brief -     Dg Chest 2 View  Result Date: 08/06/2017 CLINICAL DATA:  Pain following fall EXAM: CHEST  2 VIEW COMPARISON:  August 05, 2017 FINDINGS: There is scarring in the left base region. There is no edema or consolidation. Heart is upper normal in size with pulmonary vascularity within normal limits. No adenopathy. There  is an aortic valve replacement. Patient is status post internal mammary bypass grafting. No pneumothorax. There is degenerative change in the thoracic spine. IMPRESSION: Scarring left base. No edema or consolidation. Postoperative changes. No pneumothorax. Electronically Signed   By: Lowella Grip III M.D.   On: 08/06/2017 19:33   Dg Chest 2 View  Result Date: 08/05/2017 CLINICAL DATA:  Central chest pain with diarrhea. EXAM: CHEST  2 VIEW COMPARISON:  07/18/2017. FINDINGS: The heart is enlarged. Prior median sternotomy for aortic valve replacement and CABG. Mild fibrotic change in the lung fields but no consolidation or edema. No effusion or pneumothorax. Bones unremarkable IMPRESSION: No active cardiopulmonary disease.  Stable chest. Electronically Signed   By: Staci Righter M.D.   On: 08/05/2017 21:26   Dg Chest Portable 1 View  Result Date: 08/21/2017 CLINICAL DATA:  Chest pain. EXAM: PORTABLE CHEST 1 VIEW COMPARISON:  08/06/2017. FINDINGS: Prior median sternotomy and cardiac valve replacement. Cardiomegaly with normal pulmonary vascularity. Low lung volumes. No pleural effusion or pneumothorax IMPRESSION: 1. Prior median sternotomy and cardiac valve replacement. Cardiomegaly with normal pulmonary vascularity. 2.  No focal infiltrate. Electronically Signed   By: Marcello Moores  Register   On: 08/21/2017 14:27    Micro Results    No results found for this or any previous visit (from the past 240 hour(s)).     Today   Subjective:   Christopher Cummings today has no headache,no chest abdominal pain,no new weakness tingling or numbness, feels much better wants to go home today.   Objective:   Blood pressure (!) 151/56, pulse 87, temperature 98.1 F (36.7 C), temperature source Oral, resp. rate 20, height 5' 9"  (1.753 m), weight 95 kg (209 lb 6.4 oz), SpO2 91 %.   Intake/Output Summary (Last 24 hours) at 08/23/2017 1430 Last data filed at 08/23/2017 0950 Gross per 24 hour  Intake 240 ml  Output  2000 ml  Net -1760 ml    Exam Awake Alert, Oriented x 3, No new F.N deficits, Normal affect [place].AT,PERRAL Supple Neck,No JVD, No cervical lymphadenopathy appriciated.  Symmetrical Chest wall movement, Good air movement bilaterally, CTAB RRR,No Gallops,Rubs or new Murmurs, No Parasternal Heave +ve B.Sounds, Abd Soft, Non tender, No organomegaly appriciated, No rebound -guarding or rigidity. No Cyanosis, Clubbing or edema, No new Rash or bruise  Data Review   CBC w Diff:  Lab Results  Component Value Date   WBC 8.1 08/23/2017   HGB 13.2 08/23/2017   HGB 13.7 05/21/2017   HCT 39.6 (L) 08/23/2017   HCT 41.9 05/21/2017   PLT 194 08/23/2017   PLT 246 05/21/2017   LYMPHOPCT 18 06/06/2017   MONOPCT 10 06/06/2017   EOSPCT 2 06/06/2017   BASOPCT 1 06/06/2017    CMP:  Lab Results  Component Value Date   NA 140 08/23/2017   NA 145 (H) 06/13/2017   K 4.2 08/23/2017   CL 104 08/23/2017   CO2 26 08/23/2017   BUN 12 08/23/2017   BUN 13 06/13/2017   CREATININE 0.77 08/23/2017   PROT 7.7 08/05/2017   ALBUMIN 4.0 08/05/2017   BILITOT 0.6 08/05/2017   ALKPHOS 92 08/05/2017   AST 24 08/05/2017   ALT 18 08/05/2017  .   Total Time in preparing paper work, data evaluation and todays exam - 35 minutes  Epifanio Lesches M.D on 08/23/2017 at 2:30 PM    Note: This dictation was prepared with Dragon dictation along with smaller phrase technology. Any transcriptional errors that result from this process are unintentional.

## 2017-08-23 NOTE — Progress Notes (Signed)
Progress Note  Patient Name: Christopher Michele Sr. Date of Encounter: 08/23/2017  Primary Cardiologist: Lorine Bears, MD   Subjective   Cardiac catheterization yesterday showed no significant change in coronary anatomy.  He had some chest pain overnight that required morphine.  He feels better this morning.  He seems to be very anxious about his symptoms.  Inpatient Medications    Scheduled Meds: . aspirin EC  81 mg Oral Daily  . atorvastatin  80 mg Oral QHS  . clopidogrel  75 mg Oral Daily  . digoxin  0.0625 mg Oral Daily  . docusate sodium  100 mg Oral BID  . enoxaparin (LOVENOX) injection  40 mg Subcutaneous Q24H  . insulin aspart  0-9 Units Subcutaneous TID WC  . isosorbide mononitrate  30 mg Oral Daily  . metoprolol succinate  50 mg Oral Daily  . pantoprazole  40 mg Oral Daily  . ranolazine  1,000 mg Oral BID  . sodium chloride flush  3 mL Intravenous Q12H  . sodium chloride flush  3 mL Intravenous Q12H  . spironolactone  12.5 mg Oral Daily   Continuous Infusions: . sodium chloride     PRN Meds: sodium chloride, acetaminophen **OR** acetaminophen, bisacodyl, diphenhydrAMINE, meclizine, morphine injection, nitroGLYCERIN, ondansetron **OR** ondansetron (ZOFRAN) IV, ondansetron, sodium chloride flush   Vital Signs    Vitals:   08/23/17 0105 08/23/17 0411 08/23/17 0824 08/23/17 0827  BP: (!) 115/50 122/62 (!) 151/56   Pulse: 85 78 82 87  Resp:  18 20   Temp:  98.3 F (36.8 C) 98.1 F (36.7 C)   TempSrc:  Oral Oral   SpO2: 93% 99%  91%  Weight:  209 lb 6.4 oz (95 kg)    Height:        Intake/Output Summary (Last 24 hours) at 08/23/2017 0846 Last data filed at 08/23/2017 0413 Gross per 24 hour  Intake 0 ml  Output 1700 ml  Net -1700 ml   Filed Weights   08/21/17 1835 08/22/17 0429 08/23/17 0411  Weight: 211 lb 12.8 oz (96.1 kg) 211 lb 14.4 oz (96.1 kg) 209 lb 6.4 oz (95 kg)    Telemetry    Normal sinus rhythm with heart rate in the 90s- Personally  Reviewed  ECG    Not done today- Personally Reviewed  Physical Exam   GEN: No acute distress.   Neck: No JVD Cardiac: RRR, no murmurs, rubs, or gallops.  Respiratory: Clear to auscultation bilaterally. GI: Soft, nontender, non-distended  MS: No edema; No deformity. Neuro:  Nonfocal  Psych: Normal affect  Radial pulses normal with no hematoma  Labs    Chemistry Recent Labs  Lab 08/21/17 1354 08/22/17 0307 08/23/17 0355  NA 134* 140 140  K 4.3 3.1* 4.2  CL 98* 103 104  CO2 21* 26 26  GLUCOSE 438* 152* 152*  BUN 10 10 12   CREATININE 0.90 0.69 0.77  CALCIUM 9.8 8.9 9.0  GFRNONAA >60 >60 >60  GFRAA >60 >60 >60  ANIONGAP 15 11 10      Hematology Recent Labs  Lab 08/21/17 1354 08/22/17 0307 08/23/17 0355  WBC 10.2 8.6 8.1  RBC 5.40 4.87 4.76  HGB 15.0 13.5 13.2  HCT 44.9 40.5 39.6*  MCV 83.1 83.2 83.2  MCH 27.7 27.7 27.8  MCHC 33.4 33.3 33.4  RDW 15.4* 16.1* 15.5*  PLT 239 205 194    Cardiac Enzymes Recent Labs  Lab 08/21/17 1354 08/21/17 1731 08/21/17 2129 08/22/17 0307  TROPONINI <0.03 0.36*  0.69* 0.75*   No results for input(s): TROPIPOC in the last 168 hours.   BNPNo results for input(s): BNP, PROBNP in the last 168 hours.   DDimer No results for input(s): DDIMER in the last 168 hours.   Radiology    Dg Chest Portable 1 View  Result Date: 08/21/2017 CLINICAL DATA:  Chest pain. EXAM: PORTABLE CHEST 1 VIEW COMPARISON:  08/06/2017. FINDINGS: Prior median sternotomy and cardiac valve replacement. Cardiomegaly with normal pulmonary vascularity. Low lung volumes. No pleural effusion or pneumothorax IMPRESSION: 1. Prior median sternotomy and cardiac valve replacement. Cardiomegaly with normal pulmonary vascularity. 2.  No focal infiltrate. Electronically Signed   By: Maisie Fushomas  Register   On: 08/21/2017 14:27    Cardiac Studies   Cardiac catheterization yesterday:  Conclusions: 1. Severe native left coronary artery disease, not significantly changed  from prior catheterizations as recently as 06/2017.  No culprit lesion to explain acute chest pain and elevated troponin.  Question if the patient had transient tachyarrhythmia with supply-demand mismatch. 2. Patent stents in the LCx and RCA; distal LCx stent has stable 40% in-stent restenosis. 3. Chronic total occlusion of diagonal stent. 4. Widely patent LIMA to LAD, supplying a small and diffusely diseased mid/distal LAD.  Recommendations: 1. Continue medical therapy.  No obvious targets for percutaneous intervention, as most significant disease predominantly affects the small vessels.  Will restart isosorbide mononitrate, to be uptitrated as blood pressure allows. 2. Monitor overnight to ensure resolution of chest pain. 3. Continue dual antiplatelet therapy.  Yvonne Kendallhristopher End, MD [facility]  Patient Profile     67 y.o. male with history of CAD s/p CABG and multiple PCIs (most recently 05/2017), aortic stenosis s/p bioprosthetic AVR, HTN, HLD, ischemic cardiomyopathy with chronic systolic heart failure, diabetes mellitus, and rectal bleedingwho is being seen today for the evaluation of chest pain.    Assessment & Plan    1.  Unstable angina: Minor elevation in troponin.  Cardiac catheterization showed small vessel disease with no significant change in coronary anatomy since most recent cardiac catheterization.  I agree with continuing aggressive medical therapy.  Continue current antianginal medications. I agree that anxiety might be contributing to frequency of his symptoms and this will have to be addressed.  2.  Ischemic cardiomyopathy: No evidence of fluid overload.  Continue treatment with Toprol and spironolactone.  We can consider adding an ACE inhibitor or ARB as an outpatient if blood pressure allows.  3.  Paroxysmal atrial fibrillation: He remains in sinus rhythm.  Long-term anticoagulation has been deferred due to GI bleed in the past while on dual antiplatelet therapy.   I am going to discontinue digoxin.  The patient can be discharged home from a cardiac standpoint.  For questions or updates, please contact [facility] Please consult [email or web address] for contact info under Cardiology/STEMI.      Signed, Lorine BearsMuhammad Arida, MD  08/23/2017, 8:46 AM

## 2017-08-24 ENCOUNTER — Telehealth: Payer: Self-pay

## 2017-08-24 NOTE — Telephone Encounter (Signed)
I have made the 2nd attempt to contact the patient or family member in charge, in order to follow up from recently being discharged from the hospital.  

## 2017-08-24 NOTE — Telephone Encounter (Signed)
I have made the 1st attempt to contact the patient or family member in charge, in order to follow up from recently being discharged from the hospital. No voicemal set up- will call back later today.

## 2017-08-27 ENCOUNTER — Telehealth: Payer: Self-pay | Admitting: *Deleted

## 2017-08-27 NOTE — Telephone Encounter (Signed)
S/w male who answered patient's home phone. States patient is still asleep and asks for our office to "not call the house so early in the morning". Prefers calls after 1pm when patient gets up.  Notified Martie LeeSabrina who will add this info into chart

## 2017-08-27 NOTE — Telephone Encounter (Signed)
Doctor's office called to inform staff that [name] had been admitted earlier and [facility] follow up appointment this Thursday with PA

## 2017-08-27 NOTE — Telephone Encounter (Signed)
[facility]  Patient understands to follow up with provider Eula Listenyan Dunn, PA-C on Feb 28 at 10am at [place] office. Patient understands discharge instructions? yes Patient understands medications and regiment? yes Patient understands to bring all medications to this visit? yes  Pt inquires if he should go to cardiac rehab tomorrow and Thursday or wait for clearance to resume after his OV.  Discussed w/Ryan Dunn who advises to wait until after his 2/28 appt to resume cardiac rehab. Pt verbalized understanding.  Notified Meredith at Cardiac Rehab.

## 2017-08-29 ENCOUNTER — Encounter: Payer: Self-pay | Admitting: *Deleted

## 2017-08-29 DIAGNOSIS — Z9861 Coronary angioplasty status: Secondary | ICD-10-CM

## 2017-08-29 DIAGNOSIS — I214 Non-ST elevation (NSTEMI) myocardial infarction: Secondary | ICD-10-CM

## 2017-08-29 NOTE — Progress Notes (Signed)
Cardiology Office Note Date:  08/30/2017  Patient ID:  Christopher Baskett Sr., DOB 11-Dec-1950, MRN [id number] PCP:  [name], NP  Cardiologist:  Dr. Fletcher Anon, MD    Chief Complaint: Hospital follow up  History of Present Illness: Christopher Althaus Sr. is a 67 y.o. male with history of CAD status post CABG and multiple PCI's as detailed below, chronic systolic CHF due to ischemic cardiomyopathy, aortic stenosis status post bioprosthetic AVR at the time of his bypass surgery in 07/2015, HTN, HLD, diabetes mellitus, and rectal bleeding who presents for hospital follow-up after recent admission to [facility] from 2/19 through 2/21 for non-STEMI.  Patient is status post prior stenting of the diagonal in 2009 followed by two-vessel CABG performed in [place] in 07/2015.  Following that, he required stents to the RCA and proximal/distal LCx.  He has had multiple hospitalizations related to chest pain with finding of in-stent restenosis of the diagonal and proximal and distal left circumflex in 01/2017 with subsequent PTCA of the proximal and distal left circumflex.  An initial attempt was made at medical therapy of the diagonal branch however, and 04/2017, he required cutting balloon angioplasty with an the diagonal stent as well as proximal and distal left circumflex stents in the setting of recurrent angina.  And late 04/2017 he was readmitted with chest pain and underwent stress testing which was nonischemic.  In 05/2017 he was readmitted with recurrent chest pain and non-STEMI.  He was found again to have severe narrowing/in-stent restenosis in the proximal left circumflex and diagonal.  The left circumflex was treated with PCI/DES while the diagonal was medically managed.  In mid 06/2017, he was readmitted with chest pain and mild troponin elevation.  Cardiac catheterization revealed patent RCA and left circumflex stents with a total occlusion of the diagonal.  Medical management was advised.  He was  readmitted in 07/2017 with recurrent chest pain.  Troponin peaked at 0.06.  Symptoms improved with GI cocktail.  No further ischemic evaluation was performed at that time.  He was seen in the ED in early 08/2017 for diarrhea and mechanical fall.  He was most recently admitted to the hospital on 08/21/17 for recurrent chest pain and non-STEMI.  Troponin peaked at 0.75.  He underwent repeat left heart catheterization on 08/22/17 that showed severe native LAD disease, not significantly changed from prior cardiac catheterizations as recently as 06/2017.  He was also noted to have patent stents in the LCx and RCA with the distal LCx stent having stable 40% in-stent restenosis.  Chronic total occlusion of the diagonal stent was noted.  Widely patent LIMA to LAD, supplying a small and diffusely diseased mid/distal LAD.  No culprit lesion to explain the patient's acute chest pain and elevated troponin was identified.  There was question if the patient had transient tachyarrhythmia with supply demand mismatch.  Continued medical therapy was recommended given there were no obvious targets for PCI, as most of the significant disease predominantly affects the small vessels.  Post cath, he continued to be very anxious regarding his symptoms.  There was some concern if anxiety was playing a role as well.  He comes in doing well today.  He has not had any further chest pain.  He is now walking 1.5 miles daily without any issues.  He hopes to get back up to his 3 mile daily walk that he was doing back in the fall 2018.  He has been compliant with all medications.  No melena or BRBPR.  No falls.  Blood pressure at home has consistently been running in the low 381O systolic.  He is unaware of any episodes of A. fib with RVR.  He reports Xanax once nightly has helped him greatly.  He will talk with his PCP at his visit with her on 3/1 for a potential refill of this medication.   Past Medical History:  Diagnosis Date  . Aortic  stenosis    a. 07/2015 s/p bioprosthetic AVR (#23 [employer]) - [place]; b. 04/2017 Echo: nl fxn'ing AoV.  Marland Kitchen CAD (coronary artery disease)    a. 2009 PCI->D1; b. 07/2015 CABG x 2 reported (LIMA-LAD noted on cath 01/2017) [facility] - [place]; c. 2017/2018 Prox/Dist LCX & D1 stenting; d. 01/2017 PCI: ISR prox/dist LCX stents (PTCA), ISR D1 (med Rx); e. 04/2017 PCI: CBA for ISR- LCX 90p, 70d, D1 95; f. 05/2017 PCI: D1 99 ISR, LCX 70p (2.25x22 On[employer] DES); g. 06/2017 Cath: Patent LCX and RCA stents. D1 100 ISR->Med Rx.  . Chronic systolic CHF (congestive heart failure) ([place])    a. 04/2017 Echo: EF 35-40% with nl functioning bioprosthetic AoV, mild MR, nl PA.  . Colitis   . Diabetes mellitus with complication ([place])   . Diverticulitis   . Essential hypertension   . Hyperlipidemia   . Ischemic cardiomyopathy    a. 04/2017 Echo: EF 35-40%.  . Permanent atrial fibrillation ([place])    a. CHA2DS2VASc = 4-->No OAC 2/2 ongoing ASA/Plavix and h/o rectal bleeding.  . Rectal bleeding    a. 03/2017 -> f/u @ [facility].    Past Surgical History:  Procedure Laterality Date  . AORTIC VALVE REPLACEMENT    . CORONARY ARTERY BYPASS GRAFT    . CORONARY BALLOON ANGIOPLASTY N/A 04/02/2017   Procedure: CORONARY BALLOON ANGIOPLASTY;  Surgeon: Troy Sine, MD;  Location: [place] [facility];  Service: Cardiovascular;  Laterality: N/A;  . CORONARY STENT INTERVENTION N/A 02/26/2017   Procedure: CORONARY STENT INTERVENTION;  Surgeon: Wellington Hampshire, MD;  Location: [place] [facility];  Service: Cardiovascular;  Laterality: N/A;  . CORONARY STENT INTERVENTION N/A 05/08/2017   Procedure: CORONARY STENT INTERVENTION;  Surgeon: Nelva Bush, MD;  Location: [place] [facility];  Service: Cardiovascular;  Laterality: N/A;  . CORONARY/GRAFT ANGIOGRAPHY N/A 02/26/2017   Procedure: CORONARY/GRAFT ANGIOGRAPHY;  Surgeon: Wellington Hampshire, MD;  Location: [place] [facility];  Service: Cardiovascular;  Laterality: N/A;  .  CORONARY/GRAFT ANGIOGRAPHY N/A 04/02/2017   Procedure: CORONARY/GRAFT ANGIOGRAPHY;  Surgeon: Troy Sine, MD;  Location: [place] [facility];  Service: Cardiovascular;  Laterality: N/A;  . CORONARY/GRAFT ANGIOGRAPHY N/A 05/08/2017   Procedure: CORONARY/GRAFT ANGIOGRAPHY;  Surgeon: Nelva Bush, MD;  Location: [place] [facility];  Service: Cardiovascular;  Laterality: N/A;  . CORONARY/GRAFT ANGIOGRAPHY N/A 08/22/2017   Procedure: CORONARY/GRAFT ANGIOGRAPHY;  Surgeon: Nelva Bush, MD;  Location: [place] [facility];  Service: Cardiovascular;  Laterality: N/A;  . JOINT REPLACEMENT Left    KNEE  . KNEE ARTHROPLASTY  2000  . LEFT HEART CATH AND CORONARY ANGIOGRAPHY N/A 06/15/2017   Procedure: LEFT HEART CATH AND CORONARY ANGIOGRAPHY;  Surgeon: Wellington Hampshire, MD;  Location: [place] [facility];  Service: Cardiovascular;  Laterality: N/A;    Current Meds  Medication Sig  . acetaminophen (TYLENOL) 325 MG tablet Take 2 tablets (650 mg total) by mouth every 6 (six) hours as needed for mild pain (or Fever >/= 101).  Marland Kitchen ALPRAZolam (XANAX) 0.25 MG tablet Take 1  tablet (0.25 mg total) by mouth at bedtime as needed for anxiety.  Marland Kitchen alum & mag hydroxide-simeth (MAALOX/MYLANTA) 200-200-20 MG/5ML suspension Take 30 mLs by mouth every 6 (six) hours as needed for indigestion or heartburn.  Marland Kitchen aspirin EC 81 MG tablet Take 1 tablet (81 mg total) by mouth daily.  Marland Kitchen atorvastatin (LIPITOR) 80 MG tablet Take 1 tablet (80 mg total) by mouth at bedtime.  . blood glucose meter kit and supplies Dispense based on patient and insurance preference. Use 1 time daily as directed. (FOR ICD-9 250.00, 250.01).  . Blood Pressure Monitoring (BLOOD PRESSURE CUFF) MISC 1 Units by Does not apply route daily.  . clopidogrel (PLAVIX) 75 MG tablet Take 1 tablet (75 mg total) daily by mouth.  . cyclobenzaprine (FLEXERIL) 5 MG tablet Take 1 tablet (5 mg total) by mouth 3 (three) times daily as needed for muscle spasms.  .  digoxin (LANOXIN) 0.125 MG tablet Take 0.5 tablets (0.0625 mg total) by mouth daily.  Marland Kitchen docusate sodium (COLACE) 100 MG capsule Take 1 capsule (100 mg total) by mouth 2 (two) times daily.  . empagliflozin (JARDIANCE) 25 MG TABS tablet Take 25 mg daily by mouth.  Marland Kitchen HYDROcodone-acetaminophen (NORCO/VICODIN) 5-325 MG tablet Take 1 tablet by mouth every 4 (four) hours as needed for moderate pain.  . isosorbide mononitrate (IMDUR) 60 MG 24 hr tablet Take 1 tablet (60 mg total) by mouth daily.  . meclizine (ANTIVERT) 25 MG tablet Take 25 mg by mouth 3 (three) times daily as needed for dizziness.  . metFORMIN (GLUCOPHAGE-XR) 500 MG 24 hr tablet Take 1 tablet (500 mg total) daily with breakfast by mouth.  . metoprolol succinate (TOPROL-XL) 50 MG 24 hr tablet Take one tablet (50 mg) daily if systolic blood pressure is >100  . nitroGLYCERIN (NITROSTAT) 0.4 MG SL tablet Place 1 tablet (0.4 mg total) under the tongue every 5 (five) minutes as needed for chest pain.  Marland Kitchen ondansetron (ZOFRAN ODT) 4 MG disintegrating tablet Take 1 tablet (4 mg total) by mouth every 8 (eight) hours as needed for nausea or vomiting.  . pantoprazole (PROTONIX) 40 MG tablet Take 1 tablet (40 mg total) daily by mouth.  . predniSONE (DELTASONE) 50 MG tablet Take 1 tablet (50 mg total) by mouth daily with breakfast.  . ranolazine (RANEXA) 1000 MG SR tablet Take 1 tablet (1,000 mg total) by mouth 2 (two) times daily.  Marland Kitchen spironolactone (ALDACTONE) 25 MG tablet Take 0.5 tablets (12.5 mg total) daily by mouth.    Allergies:   Metformin and related   Social History:  The patient  reports that  has never smoked. he has never used smokeless tobacco. He reports that he does not drink alcohol or use drugs.   Family History:  The patient's family history includes CAD in his brother and mother; Diabetes in his sister; Healthy in his brother, paternal grandmother, and sister; Heart attack in his brother; Heart failure in his mother; Lupus in his  mother; Prostate cancer in his father and paternal grandfather.  ROS:   Review of Systems  Constitutional: Positive for malaise/fatigue. Negative for chills, diaphoresis, fever and weight loss.  HENT: Negative for congestion.   Eyes: Negative for discharge and redness.  Respiratory: Negative for cough, hemoptysis, sputum production, shortness of breath and wheezing.   Cardiovascular: Positive for chest pain. Negative for palpitations, orthopnea, claudication, leg swelling and PND.  Gastrointestinal: Negative for abdominal pain, blood in stool, heartburn, melena, nausea and vomiting.  Genitourinary: Negative for hematuria.  Musculoskeletal: Negative for falls and myalgias.  Skin: Negative for rash.  Neurological: Positive for weakness. Negative for dizziness, tingling, tremors, sensory change, speech change, focal weakness and loss of consciousness.  Endo/Heme/Allergies: Does not bruise/bleed easily.  Psychiatric/Behavioral: Negative for substance abuse. The patient is nervous/anxious.   All other systems reviewed and are negative.    PHYSICAL EXAM:  VS:  BP (!) 106/54 (BP Location: Left Arm, Patient Position: Sitting, Cuff Size: Normal)   Pulse 69   Ht 5' 9"  (1.753 m)   Wt 214 lb 8 oz (97.3 kg)   BMI 31.68 kg/m  BMI: Body mass index is 31.68 kg/m.  Physical Exam  Constitutional: He is oriented to person, place, and time. He appears well-developed and well-nourished.  HENT:  Head: Normocephalic and atraumatic.  Eyes: Right eye exhibits no discharge. Left eye exhibits no discharge.  Neck: Normal range of motion. No JVD present.  Cardiovascular: Normal rate, regular rhythm, S1 normal and S2 normal. Exam reveals no distant heart sounds, no friction rub, no midsystolic click and no opening snap.  Murmur heard.  Harsh midsystolic murmur is present with a grade of 2/6 at the upper right sternal border radiating to the neck. Pulses:      Posterior tibial pulses are 2+ on the right side,  and 2+ on the left side.  Pulmonary/Chest: Effort normal and breath sounds normal. No respiratory distress. He has no decreased breath sounds. He has no wheezes. He has no rales. He exhibits no tenderness.  Abdominal: Soft. He exhibits no distension. There is no tenderness.  Musculoskeletal: He exhibits no edema.  Neurological: He is alert and oriented to person, place, and time.  Skin: Skin is warm and dry. No cyanosis. Nails show no clubbing.  Psychiatric: He has a normal mood and affect. His speech is normal and behavior is normal. Judgment and thought content normal.     EKG:  Was ordered and interpreted by me today. Shows NSR, 69 bpm, nonspecific lateral st/t changes  Recent Labs: 03/31/2017: TSH 2.117 04/23/2017: B Natriuretic Peptide 158.0 04/24/2017: Magnesium 1.8 08/05/2017: ALT 18 08/23/2017: BUN 12; Creatinine, Ser 0.77; Hemoglobin 13.2; Platelets 194; Potassium 4.2; Sodium 140  04/25/2017: Cholesterol 80; HDL 29; LDL Cholesterol 36; Total CHOL/HDL Ratio 2.8; Triglycerides 77; VLDL 15   Estimated Creatinine Clearance: 107.3 mL/min (by C-G formula based on SCr of 0.77 mg/dL).   Wt Readings from Last 3 Encounters:  08/30/17 214 lb 8 oz (97.3 kg)  08/23/17 209 lb 6.4 oz (95 kg)  08/06/17 213 lb (96.6 kg)     Other studies reviewed: Additional studies/records reviewed today include: summarized above  ASSESSMENT AND PLAN:  1. CAD of native arteries and bypass grafts with stable angina: Currently chest pain-free.  Multiple recent cardiac catheterizations as above.  Most recent cardiac catheterization from 08/22/2017 demonstrating patent proximal and distal left circumflex stents as well as patent RCA stent.  Known occlusion of diagonal.  Medical management was advised.  He remains on optimal antianginal medical therapy including Imdur 60 mg daily, metoprolol XL 50 mg daily, Ranexa 1000 mg daily.  Relative hypotension precludes further titration of these medications at this time.  He  remains on dual antiplatelet therapy indefinitely with aspirin and Plavix.  It has been felt that some of his symptoms etiology is related to underlying anxiety/panic attack in the setting of his known severe cardiac history/CAD.  Cannot rule out the possibility of possible tachycardia mediated anginal symptoms with associated troponin leak.  Patient should  continue to advance activity as tolerated.  He should continue to participate in cardiac rehab.  No plans for further ischemic evaluation at this time.  2. ICM: He does not appear volume overloaded at this time.  Weight has been stable at home.  Continue Toprol-XL, spironolactone, and digoxin.  Check digoxin level today.  He has previously been on ACE inhibitor though this was discontinued in the setting of relative hypotension.  Given his soft blood pressure today we will continue to hold ACE inhibitor at this time.  Look to restart ACE inhibitor therapy at follow-up if blood pressure allows.  CHF education provided.  3. Persistent Afib: Currently and sinus rhythm with heart rate of 69 bpm.  As above, it has been felt that he may be having issues of A. fib with RVR leading to anginal symptoms and associated troponin leak.  Discussed with MD about the possibility of adding antiarrhythmic therapy in an effort to maintain sinus rhythm.  We will initially plan for outpatient cardiac monitoring with a Zio monitor for the next 14 days to evaluate for A. fib burden prior to committing to antiarrhythmic therapy.  Continue digoxin as above given his CHF.  Continue Toprol as above. Check digoxin level. Has not been on [facility] given need for DAPT with prior history of GI bleed in 03/2017. He is aware of stroke risk. May need to consider re-challenge of [place] in the future.   4. Status post bioprosthetic AVR: Echo in 04/2017 showed normal valve function. Follow clinically with periodic echo.   5. HTN: Blood pressure stable, though on the soft side today; this is typical  for him.  Continue current medications as above.  6. Anxiety: Much improved following initiation of Xanax during most recent hospitalization.  He may benefit from continuation of this medication.  He plans to discuss refills of Xanax with his PCP at his visit with her on 3/1.  I to agree that he may benefit from continuation of this medication.  7. HLD: LDL of 36 in 04/2017. Normal LFTs. Continue Lipitor 80 mg daily.   8. History of GI bleed: CBC 08/23/2017 with stable HGB.   9. DM2: Followed by PCP.   Disposition: F/u with me in 1 month.  Current medicines are reviewed at length with the patient today.  The patient did not have any concerns regarding medicines.  Signed, Christell Faith, PA-C 08/30/2017 10:13 AM     [facility] [place],  [id number] [phone]

## 2017-08-29 NOTE — Progress Notes (Signed)
Cardiac Individual Treatment Plan  Patient Details  Name: Wilmot Quevedo Sr. MRN: [id number] Date of Birth: 1950-08-01 Referring Provider:     Cardiac Rehab from 05/21/2017 in [facility] Cardiac and Pulmonary Rehab  Referring Provider  Kathlyn Sacramento MD      Initial Encounter Date:    Cardiac Rehab from 05/21/2017 in [facility] Cardiac and Pulmonary Rehab  Date  05/21/17  Referring Provider  Kathlyn Sacramento MD      Visit Diagnosis: NSTEMI (non-ST elevated myocardial infarction) ([place])  S/P PTCA (percutaneous transluminal coronary angioplasty)  Patient's Home Medications on Admission:  Current Outpatient Medications:  .  acetaminophen (TYLENOL) 325 MG tablet, Take 2 tablets (650 mg total) by mouth every 6 (six) hours as needed for mild pain (or Fever >/= 101)., Disp: , Rfl:  .  ALPRAZolam (XANAX) 0.25 MG tablet, Take 1 tablet (0.25 mg total) by mouth at bedtime as needed for anxiety., Disp: 30 tablet, Rfl: 0 .  alum & mag hydroxide-simeth (MAALOX/MYLANTA) 200-200-20 MG/5ML suspension, Take 30 mLs by mouth every 6 (six) hours as needed for indigestion or heartburn., Disp: 355 mL, Rfl: 0 .  aspirin EC 81 MG tablet, Take 1 tablet (81 mg total) by mouth daily., Disp: , Rfl:  .  atorvastatin (LIPITOR) 80 MG tablet, Take 1 tablet (80 mg total) by mouth at bedtime., Disp: 90 tablet, Rfl: 3 .  blood glucose meter kit and supplies, Dispense based on patient and insurance preference. Use 1 time daily as directed. (FOR ICD-9 250.00, 250.01)., Disp: 1 each, Rfl: 0 .  Blood Pressure Monitoring (BLOOD PRESSURE CUFF) MISC, 1 Units by Does not apply route daily., Disp: 1 each, Rfl: 0 .  clopidogrel (PLAVIX) 75 MG tablet, Take 1 tablet (75 mg total) daily by mouth., Disp: 90 tablet, Rfl: 3 .  cyclobenzaprine (FLEXERIL) 5 MG tablet, Take 1 tablet (5 mg total) by mouth 3 (three) times daily as needed for muscle spasms., Disp: 20 tablet, Rfl: 0 .  digoxin (LANOXIN) 0.125 MG tablet, Take 0.5 tablets (0.0625 mg  total) by mouth daily., Disp: 45 tablet, Rfl: 3 .  docusate sodium (COLACE) 100 MG capsule, Take 1 capsule (100 mg total) by mouth 2 (two) times daily., Disp: 60 capsule, Rfl: 5 .  empagliflozin (JARDIANCE) 25 MG TABS tablet, Take 25 mg daily by mouth., Disp: 30 tablet, Rfl: 5 .  HYDROcodone-acetaminophen (NORCO/VICODIN) 5-325 MG tablet, Take 1 tablet by mouth every 4 (four) hours as needed for moderate pain., Disp: 8 tablet, Rfl: 0 .  isosorbide mononitrate (IMDUR) 60 MG 24 hr tablet, Take 1 tablet (60 mg total) by mouth daily., Disp: 60 tablet, Rfl: 6 .  meclizine (ANTIVERT) 25 MG tablet, Take 25 mg by mouth 3 (three) times daily as needed for dizziness., Disp: , Rfl:  .  metFORMIN (GLUCOPHAGE-XR) 500 MG 24 hr tablet, Take 1 tablet (500 mg total) daily with breakfast by mouth., Disp: 30 tablet, Rfl: 5 .  metoprolol succinate (TOPROL-XL) 50 MG 24 hr tablet, Take one tablet (50 mg) daily if systolic blood pressure is >100, Disp: 30 tablet, Rfl: 3 .  nitroGLYCERIN (NITROSTAT) 0.4 MG SL tablet, Place 1 tablet (0.4 mg total) under the tongue every 5 (five) minutes as needed for chest pain., Disp: 25 tablet, Rfl: 0 .  ondansetron (ZOFRAN ODT) 4 MG disintegrating tablet, Take 1 tablet (4 mg total) by mouth every 8 (eight) hours as needed for nausea or vomiting., Disp: 20 tablet, Rfl: 0 .  pantoprazole (PROTONIX) 40 MG tablet, Take 1  tablet (40 mg total) daily by mouth., Disp: 90 tablet, Rfl: 3 .  predniSONE (DELTASONE) 50 MG tablet, Take 1 tablet (50 mg total) by mouth daily with breakfast., Disp: 5 tablet, Rfl: 0 .  ranolazine (RANEXA) 1000 MG SR tablet, Take 1 tablet (1,000 mg total) by mouth 2 (two) times daily., Disp: 180 tablet, Rfl: 0 .  spironolactone (ALDACTONE) 25 MG tablet, Take 0.5 tablets (12.5 mg total) daily by mouth., Disp: 45 tablet, Rfl: 3  Past Medical History: Past Medical History:  Diagnosis Date  . Aortic stenosis    a. 07/2015 s/p bioprosthetic AVR (#23 [employer]) - [place];  b. 04/2017 Echo: nl fxn'ing AoV.  Marland Kitchen CAD (coronary artery disease)    a. 2009 PCI->D1; b. 07/2015 CABG x 2 reported (LIMA-LAD noted on cath 01/2017) [facility] [place]; c. 2017/2018 Prox/Dist LCX & D1 stenting; d. 01/2017 PCI: ISR prox/dist LCX stents (PTCA), ISR D1 (med Rx); e. 04/2017 PCI: CBA for ISR- LCX 90p, 70d, D1 95; f. 05/2017 PCI: D1 99 ISR, LCX 70p (2.25x22 [employer] DES); g. 06/2017 Cath: Patent LCX and RCA stents. D1 100 ISR->Med Rx.  . Chronic systolic CHF (congestive heart failure) ([place])    a. 04/2017 Echo: EF 35-40% with nl functioning bioprosthetic AoV, mild MR, nl [place].  . Colitis   . Diabetes mellitus with complication ([place])   . Diverticulitis   . Essential hypertension   . Hyperlipidemia   . Ischemic cardiomyopathy    a. 04/2017 Echo: EF 35-40%.  . Permanent atrial fibrillation ([place])    a. CHA2DS2VASc = 4-->No OAC 2/2 ongoing ASA/Plavix and h/o rectal bleeding.  . Rectal bleeding    a. 03/2017 -> f/u @ [facility].    Tobacco Use: Social History   Tobacco Use  Smoking Status Never Smoker  Smokeless Tobacco Never Used    Labs: Recent Review Flowsheet Data    Labs for ITP Cardiac and Pulmonary Rehab Latest Ref Rng & Units 02/25/2017 03/31/2017 04/25/2017 05/07/2017   Cholestrol 0 - 200 mg/dL 89 108 80 -   LDLCALC 0 - 99 mg/dL 42 43 36 -   HDL >40 mg/dL 35(L) 37(L) 29(L) -   Trlycerides <150 mg/dL 60 138 77 -   Hemoglobin A1c 4.8 - 5.6 % - 8.1(H) - 9.3(H)       Exercise Target Goals:    Exercise Program Goal: Individual exercise prescription set using results from initial 6 min walk test and THRR while considering  patient's activity barriers and safety.   Exercise Prescription Goal: Initial exercise prescription builds to 30-45 minutes a day of aerobic activity, 2-3 days per week.  Home exercise guidelines will be given to patient during program as part of exercise prescription that the participant will acknowledge.  Activity Barriers & Risk Stratification: Activity  Barriers & Cardiac Risk Stratification - 05/21/17 1222      Activity Barriers & Cardiac Risk Stratification   Activity Barriers  Other (comment);Deconditioning;Muscular Weakness;Balance Concerns;Decreased Ventricular Function    Comments  Vertigo    Cardiac Risk Stratification  High       6 Minute Walk: 6 Minute Walk    Row Name 05/21/17 1221         6 Minute Walk   Phase  Initial     Distance  1165 feet     Walk Time  6 minutes     # of Rest Breaks  0     MPH  2.21     METS  2.51     RPE  7     VO2 Peak  8.8     Symptoms  No     Resting HR  80 bpm     Resting BP  132/70     Resting Oxygen Saturation   96 %     Exercise Oxygen Saturation  during 6 min walk  97 %     Max Ex. HR  115 bpm     Max Ex. BP  132/76     2 Minute Post BP  126/60        Oxygen Initial Assessment:   Oxygen Re-Evaluation:   Oxygen Discharge (Final Oxygen Re-Evaluation):   Initial Exercise Prescription: Initial Exercise Prescription - 05/21/17 1200      Date of Initial Exercise RX and Referring Provider   Date  05/21/17    Referring Provider  Kathlyn Sacramento MD      Recumbant Bike   Level  2    RPM  50    Watts  16    Minutes  15    METs  2.5      NuStep   Level  2    SPM  80    Minutes  15    METs  2.5      Track   Laps  32    Minutes  15    METs  2.45      Prescription Details   Frequency (times per week)  3    Duration  Progress to 45 minutes of aerobic exercise without signs/symptoms of physical distress      Intensity   THRR 40-80% of Max Heartrate  110-141    Ratings of Perceived Exertion  11-13    Perceived Dyspnea  0-4      Progression   Progression  Continue to progress workloads to maintain intensity without signs/symptoms of physical distress.      Resistance Training   Training Prescription  Yes    Weight  3 lbs    Reps  10-15       Perform Capillary Blood Glucose checks as needed.  Exercise Prescription Changes: Exercise Prescription Changes     Row Name 05/21/17 1200 05/29/17 1300 06/07/17 0900 06/13/17 1300 06/28/17 1400     Response to Exercise   Blood Pressure (Admit)  132/70  124/72  -  124/64  124/56   Blood Pressure (Exercise)  132/76  130/72  -  126/84  124/56   Blood Pressure (Exit)  126/60  126/64  -  120/62  118/60   Heart Rate (Admit)  80 bpm  101 bpm  -  96 bpm  93 bpm   Heart Rate (Exercise)  115 bpm  120 bpm  -  127 bpm  107 bpm   Heart Rate (Exit)  80 bpm  87 bpm  -  90 bpm  91 bpm   Oxygen Saturation (Admit)  96 %  -  -  -  -   Oxygen Saturation (Exercise)  97 %  -  -  -  -   Rating of Perceived Exertion (Exercise)  7  13  -  12  12   Symptoms  none  none  -  none  none   Comments  walk test results  first full day of exercise  -  -  -   Duration  -  Continue with 45 min of aerobic exercise without signs/symptoms of physical distress.  -  Continue with 45 min of aerobic exercise without signs/symptoms of physical distress.  Continue with 45 min of aerobic exercise without signs/symptoms of physical distress.   Intensity  -  THRR unchanged  -  THRR unchanged  THRR unchanged     Progression   Progression  -  Continue to progress workloads to maintain intensity without signs/symptoms of physical distress.  -  Continue to progress workloads to maintain intensity without signs/symptoms of physical distress.  Continue to progress workloads to maintain intensity without signs/symptoms of physical distress.   Average METs  -  2.67  -  2.38  2.43     Resistance Training   Training Prescription  -  Yes  -  Yes  Yes   Weight  -  5 lbs  -  5 lbs  5 lbs   Reps  -  10-15  -  10-15  10-15     Interval Training   Interval Training  -  No  -  No  No     Recumbant Bike   Level  -  2  -  2  2   Watts  -  20  -  16  16   Minutes  -  15  -  15  15   METs  -  2.63  -  2.47  2.47     NuStep   Level  -  2  -  2  -   Minutes  -  15  -  15  -   METs  -  2.7  -  2.3  -     Track   Laps  -  37  -  30  30   Minutes  -  15  -   15  15   METs  -  2.7  -  2.38  2.38     Home Exercise Plan   Plans to continue exercise at  -  -  Home (comment) walk or at the mall  Home (comment) walk or at the mall  Home (comment) walk or at the mall   Frequency  -  -  Add 1 additional day to program exercise sessions.  Add 1 additional day to program exercise sessions.  Add 1 additional day to program exercise sessions.   Initial Home Exercises Provided  -  -  06/07/17  06/07/17  06/07/17   Row Name 07/10/17 1600 07/23/17 1600 08/21/17 1500         Response to Exercise   Blood Pressure (Admit)  122/64  126/70  122/66     Blood Pressure (Exercise)  172/78  130/74  130/72     Blood Pressure (Exit)  132/64  112/60  112/60     Heart Rate (Admit)  83 bpm  88 bpm  65 bpm     Heart Rate (Exercise)  123 bpm  110 bpm  97 bpm     Heart Rate (Exit)  98 bpm  103 bpm  71 bpm     Rating of Perceived Exertion (Exercise)  _0 Symptoms  none  none  none     Duration  Continue with 30 min of aerobic exercise without signs/symptoms of physical distress.  Continue with 30 min of aerobic exercise without signs/symptoms of physical distress.  Continue with 30 min of aerobic exercise without signs/symptoms of physical distress.     Intensity  THRR unchanged  THRR  unchanged  THRR unchanged       Progression   Progression  Continue to progress workloads to maintain intensity without signs/symptoms of physical distress.  Continue to progress workloads to maintain intensity without signs/symptoms of physical distress.  Continue to progress workloads to maintain intensity without signs/symptoms of physical distress.     Average METs  2.62  2.72  2.19       Resistance Training   Training Prescription  Yes  Yes  Yes     Weight  5 lbs  5 lbs  5 lbs     Reps  10-15  10-15  10-15       Interval Training   Interval Training  No  No  No       Recumbant Bike   Level  2  2  -     Watts  16  26  -     Minutes  15  15  -     METs  2.47  2.79  -        NuStep   Level  _0 Minutes  _1 METs  _2 Track   Laps  _3 Minutes  _4 METs  2.38  2.38  2.38       Home Exercise Plan   Plans to continue exercise at  Home (comment) walk or at the mall  Home (comment) walk or at the mall  Home (comment) walk or at the mall     Frequency  Add 1 additional day to program exercise sessions.  Add 1 additional day to program exercise sessions.  Add 1 additional day to program exercise sessions.     Initial Home Exercises Provided  06/07/17  06/07/17  06/07/17        Exercise Comments: Exercise Comments    Row Name 05/23/17 0754 06/07/17 0914         Exercise Comments  First full day of exercise!  Patient was oriented to gym and equipment including functions, settings, policies, and procedures.  Patient's individual exercise prescription and treatment plan were reviewed.  All starting workloads were established based on the results of the 6 minute walk test done at initial orientation visit.  The plan for exercise progression was also introduced and progression will be customized based on patient's performance and goals  Reviewed home exercise with pt today.  Pt plans to walk for exercise.  Reviewed THR, pulse, RPE, sign and symptoms, NTG use, and when to call 911 or MD.  Also discussed weather considerations and indoor options.  Pt voiced understanding.         Exercise Goals and Review: Exercise Goals    Row Name 05/21/17 1224             Exercise Goals   Increase Physical Activity  Yes       Intervention  Provide advice, education, support and counseling about physical activity/exercise needs.;Develop an individualized exercise prescription for aerobic and resistive training based on initial evaluation findings, risk stratification, comorbidities and participant's personal goals.       Expected Outcomes  Achievement of increased cardiorespiratory fitness and enhanced flexibility,  muscular endurance and strength shown through measurements of functional capacity and personal statement of participant.  Increase Strength and Stamina  Yes       Intervention  Provide advice, education, support and counseling about physical activity/exercise needs.;Develop an individualized exercise prescription for aerobic and resistive training based on initial evaluation findings, risk stratification, comorbidities and participant's personal goals.       Expected Outcomes  Achievement of increased cardiorespiratory fitness and enhanced flexibility, muscular endurance and strength shown through measurements of functional capacity and personal statement of participant.       Able to understand and use rate of perceived exertion (RPE) scale  Yes       Intervention  Provide education and explanation on how to use RPE scale       Expected Outcomes  Short Term: Able to use RPE daily in rehab to express subjective intensity level;Long Term:  Able to use RPE to guide intensity level when exercising independently       Knowledge and understanding of Target Heart Rate Range (THRR)  Yes       Intervention  Provide education and explanation of THRR including how the numbers were predicted and where they are located for reference       Expected Outcomes  Short Term: Able to state/look up THRR;Long Term: Able to use THRR to govern intensity when exercising independently;Short Term: Able to use daily as guideline for intensity in rehab       Able to check pulse independently  Yes       Intervention  Provide education and demonstration on how to check pulse in carotid and radial arteries.;Review the importance of being able to check your own pulse for safety during independent exercise       Expected Outcomes  Short Term: Able to explain why pulse checking is important during independent exercise;Long Term: Able to check pulse independently and accurately       Understanding of Exercise Prescription  Yes        Intervention  Provide education, explanation, and written materials on patient's individual exercise prescription       Expected Outcomes  Short Term: Able to explain program exercise prescription;Long Term: Able to explain home exercise prescription to exercise independently          Exercise Goals Re-Evaluation : Exercise Goals Re-Evaluation    Row Name 05/23/17 0755 05/29/17 1353 06/07/17 0914 06/28/17 1341 07/10/17 1640     Exercise Goal Re-Evaluation   Exercise Goals Review  Understanding of Exercise Prescription;Knowledge and understanding of Target Heart Rate Range (THRR);Able to understand and use rate of perceived exertion (RPE) scale;Able to check pulse independently  Understanding of Exercise Prescription;Increase Physical Activity;Increase Strength and Stamina  Increase Physical Activity;Able to check pulse independently;Able to understand and use rate of perceived exertion (RPE) scale;Increase Strength and Stamina;Knowledge and understanding of Target Heart Rate Range (THRR);Understanding of Exercise Prescription  Increase Physical Activity;Increase Strength and Stamina;Understanding of Exercise Prescription  Increase Physical Activity;Increase Strength and Stamina;Understanding of Exercise Prescription   Comments  Reviewed RPE scale, THR and program prescription with pt today.  Pt voiced understanding and was given a copy of goals to take home.   Elzie completed his first full day of exercise.  He did well.  We will continue to monitor his progression.  Reviewed home exercise with pt today.  Pt plans to walk for exercise.  Reviewed THR, pulse, RPE, sign and symptoms, NTG use, and when to call 911 or MD.  Also discussed weather considerations and indoor options.  Pt voiced understanding.  Rayshun was cleared  to return to rehab after his follow up office visit.  He returned today and was able to complete his workloads without a problem.  He was disappointed in himself that he could not  exercise for the last week due to a UTI.  He is ready to get back into his home exercise as he has missed his walking regimine.  We will continue to monitor his progression.  Vishruth continues to do well in rehab.  He has been getting up to 30 laps now regularly again!  So far he has not had chest pain during exercise since returning.  We will continue to monitor his progresion.    Expected Outcomes  Short: Use RPE daily to regulate intensity.  Long: Follow program prescription in THR.  Short: Continue to attend Cardiac Rehab.  Long: Follow program prescription.   Short - Pt will add one day per week outside program sessions Long -Pt will exercise independently  Short: Continue to attend rehab regularly.  Long: Continue to exercise regularly.   Short: Try to increase workloads again.  Long: Continue to work on Animator.    [place] Name 07/23/17 1649 08/07/17 1510 08/21/17 1520         Exercise Goal Re-Evaluation   Exercise Goals Review  Increase Physical Activity;Increase Strength and Stamina;Understanding of Exercise Prescription  -  Increase Physical Activity;Increase Strength and Stamina;Understanding of Exercise Prescription     Comments  Jak had been doing well in rehab.  He had not been feeling well off and on.  He is now up to 26 watts on the recumbent  bike.  Once he is cleared to return, we will continue to monitor his progression.   Out since last review  Donaven returned today and was feeling better than he has for the last several weeks.  He was excited to be back and hopes to get back to his regular exercise routine.  We will continue to moniotr his progress.      Expected Outcomes  Short: Cleared to return to rehab.  Long: Continue to build strength and stamina.   -  Short: Continue to attend rehab regularly again.  Long: Continue to work on Office manager.         Discharge Exercise Prescription (Final Exercise Prescription Changes): Exercise  Prescription Changes - 08/21/17 1500      Response to Exercise   Blood Pressure (Admit)  122/66    Blood Pressure (Exercise)  130/72    Blood Pressure (Exit)  112/60    Heart Rate (Admit)  65 bpm    Heart Rate (Exercise)  97 bpm    Heart Rate (Exit)  71 bpm    Rating of Perceived Exertion (Exercise)  11    Symptoms  none    Duration  Continue with 30 min of aerobic exercise without signs/symptoms of physical distress.    Intensity  THRR unchanged      Progression   Progression  Continue to progress workloads to maintain intensity without signs/symptoms of physical distress.    Average METs  2.19      Resistance Training   Training Prescription  Yes    Weight  5 lbs    Reps  10-15      Interval Training   Interval Training  No      NuStep   Level  2    Minutes  15    METs  3      Track  Laps  30    Minutes  15    METs  2.38      Home Exercise Plan   Plans to continue exercise at  Home (comment) walk or at the mall    Frequency  Add 1 additional day to program exercise sessions.    Initial Home Exercises Provided  06/07/17       Nutrition:  Target Goals: Understanding of nutrition guidelines, daily intake of sodium '1500mg'$ , cholesterol '200mg'$ , calories 30% from fat and 7% or less from saturated fats, daily to have 5 or more servings of fruits and vegetables.  Biometrics: Pre Biometrics - 05/21/17 1225      Pre Biometrics   Height  5' 9.25" (1.759 m)    Weight  223 lb 6.4 oz (101.3 kg)    Waist Circumference  43.5 inches    Hip Circumference  41 inches    Waist to Hip Ratio  1.06 %    BMI (Calculated)  32.75    Single Leg Stand  1.22 seconds        Nutrition Therapy Plan and Nutrition Goals: Nutrition Therapy & Goals - 07/05/17 1046      Nutrition Therapy   Diet  TLC    Drug/Food Interactions  Statins/Certain Fruits    Protein (specify units)  12oz    Fiber  30 grams    Saturated Fats  16 max. grams    Sodium  1500 grams      Personal Nutrition  Goals   Nutrition Goal  Continue to increase daily walking milage    Personal Goal #2  Try consuming 4oz of fruit juice or a couple pieces of hard candy such as life savers when blood sugar readings are low, wait 60mn and test blood glucose again    Personal Goal #3  Choose white meats when eating out more often to help reduce total sodium and fat in the meal    Comments  patient is a diabetic but does not follow a strict diabetic diet. tries to eat more vegetables than starches. does not eat consistently throughout the day      Intervention Plan   Intervention  Prescribe, educate and counsel regarding individualized specific dietary modifications aiming towards targeted core components such as weight, hypertension, lipid management, diabetes, heart failure and other comorbidities.    Expected Outcomes  Short Term Goal: Understand basic principles of dietary content, such as calories, fat, sodium, cholesterol and nutrients.;Short Term Goal: A plan has been developed with personal nutrition goals set during dietitian appointment.;Long Term Goal: Adherence to prescribed nutrition plan.       Nutrition Assessments: Nutrition Assessments - 05/21/17 1221      MEDFICTS Scores   Pre Score  24       Nutrition Goals Re-Evaluation: Nutrition Goals Re-Evaluation    Row Name 06/28/17 1418             Goals   Current Weight  220 lb (99.8 kg)       Nutrition Goal  Meet with dietician and talk about appetite       Comment  Scheduled an appt with dietician during class on Jan 3 to talk about improving his appetite.       Expected Outcome  Short: Meet with dietician  Long: Follow recommendations set bu dietician          Nutrition Goals Discharge (Final Nutrition Goals Re-Evaluation): Nutrition Goals Re-Evaluation - 06/28/17 1418      Goals  Current Weight  220 lb (99.8 kg)    Nutrition Goal  Meet with dietician and talk about appetite    Comment  Scheduled an appt with dietician during  class on Jan 3 to talk about improving his appetite.    Expected Outcome  Short: Meet with dietician  Long: Follow recommendations set bu dietician       Psychosocial: Target Goals: Acknowledge presence or absence of significant depression and/or stress, maximize coping skills, provide positive support system. Participant is able to verbalize types and ability to use techniques and skills needed for reducing stress and depression.   Initial Review & Psychosocial Screening: Initial Psych Review & Screening - 05/21/17 1224      Initial Review   Current issues with  Current Stress Concerns    Source of Stress Concerns  Chronic Illness;Transportation    Comments  Gagandeep moved from [place] recently to be closer to family. He has recently moved in with his sister and brother in law who have a 51 year old autistic son in a group home.  Zykee says he misses his 75 year old Grandchildren that he took care of every other weekend for his son and daughter in law. Jojo admits that was his reason to quit drinking 13 years ago. He said he had his first heart attack 13 years ago about a month after he quit drinking a pint of liquor on Friday and a 5th or 2 on Saturday night. Kaseem recently retired from working at [employer] for [employer] years and said he has been in and out of the hospital every since. Daelon reports that he has never smoked. Nathan said he used to go to a large mall outside of [place] and sometimes walk 10 miles but is frustrated since he has still had heart problems including his recenlty NSTEMI/PTCA.  Jovany said he doesn't eat like he should and isn't hungry much. He reports his blood sugars in the am are sometimes 250 but sometimes 120. Discussed maybe eating something since he takes long acting Metformin. The short acting Metformin is listed as an allergy since it causes diarrhea so much. Fitzhugh carries his NTG sl and knows to discard it once it is opened per his MD he  said he discards it after 6 months. He has a new bottle ready to start next week. Keyan given infor about [employer] and bus line in case his family can't drive him. Almer said he really doesn't want to drive at times since he has vertigo at times      [place]?  Yes      Barriers   Psychosocial barriers to participate in program  The patient should benefit from training in stress management and relaxation.      Screening Interventions   Interventions  Yes;Encouraged to exercise;To provide support and resources with identified psychosocial needs    Expected Outcomes  Short Term goal: Utilizing psychosocial counselor, staff and physician to assist with identification of specific Stressors or current issues interfering with healing process. Setting desired goal for each stressor or current issue identified.;Long Term Goal: Stressors or current issues are controlled or eliminated.;Short Term goal: Identification and review with participant of any Quality of Life or Depression concerns found by scoring the questionnaire.;Long Term goal: The participant improves quality of Life and PHQ9 Scores as seen by post scores and/or verbalization of changes       Quality of Life Scores:  Quality of  Life - 05/21/17 1225      Quality of Life Scores   Health/Function Pre  18.43 %    Socioeconomic Pre  25.64 %    Psych/Spiritual Pre  24 %    Family Pre  27 %    GLOBAL Pre  22.18 %      Scores of 19 and below usually indicate a poorer quality of life in these areas.  A difference of  2-3 points is a clinically meaningful difference.  A difference of 2-3 points in the total score of the Quality of Life Index has been associated with significant improvement in overall quality of life, self-image, physical symptoms, and general health in studies assessing change in quality of life.  PHQ-9: Recent Review Flowsheet Data    Depression screen [facility] 2/9 07/04/2017 05/21/2017 05/11/2017  04/30/2017   Decreased Interest 0 0 0 0   Down, Depressed, Hopeless 0 0 0 0   PHQ - 2 Score 0 0 0 0   Altered sleeping - 1 - -   Tired, decreased energy - 1 - -   Change in appetite - 1 - -   Feeling bad or failure about yourself  - 0 - -   Trouble concentrating - 0 - -   Moving slowly or fidgety/restless - 2 - -   Suicidal thoughts - 0 - -   PHQ-9 Score - 5 - -   Difficult doing work/chores - Somewhat difficult - -     Interpretation of Total Score  Total Score Depression Severity:  1-4 = Minimal depression, 5-9 = Mild depression, 10-14 = Moderate depression, 15-19 = Moderately severe depression, 20-27 = Severe depression   Psychosocial Evaluation and Intervention: Psychosocial Evaluation - 05/23/17 0940      Psychosocial Evaluation & Interventions   Interventions  Encouraged to exercise with the program and follow exercise prescription;Relaxation education;Stress management education    Comments  Counselor met with Mr. Moan Haskell) today for initial psychosocial evaluation.  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  He reports having been in the hospital 5x since August with chest pain, etc.  He reports the last stent two weeks ago has made the most change in his increased energy levels and mood.  Nickalus has other health issues with diabetes, colitis, diverticulitis, vertigo and high blood pressure.  He reports sleeping well - sometimes "too much."  He finally got his appetite back recently and he was pleased about that the day before Thanksgiving!  Boykin denies a history of depression or anxiety or any current symptoms and he reports his mood is generally positive most of the time.  Taylan has multiple stressors with moving from [place] to [place] this past August.  His health is also an issue.  But he mostly is missing his twin 36 year old grandchildren in [place] currently.  He has goals to lose  weight; be able to walk up to 10 miles a day - as he reports doing often prior to his cardiact issues; and he would like to feel more energy and less tired.  Staff will follow with Juanda Crumble throughout the course of this program.      Expected Outcomes  Shamarion will benefit from consistent exercise to achieve his stated goals.  He will be meeting with the dietician to address his weight loss goals.  The educational and psychoeducational components of this  program will be helpful in [place] learning more about his condition and positive ways to cope.  Staff will follow.    Continue Psychosocial Services   Follow up required by staff       Psychosocial Re-Evaluation: Psychosocial Re-Evaluation    [place] Name 06/07/17 1252 06/28/17 1422           Psychosocial Re-Evaluation   Current issues with  Current Stress Concerns  Current Stress Concerns      Comments  Unable to afford Ranexa since $700/month so [place] stopped that and increased his Imdur '60mg'$ . Ason is going to verify with Dr. Fletcher Anon if he is suppose to ahv '90mg'$  Imdur.   Omair's health has continued to be his biggest stressor.  He feels that he does not have a lot of other stress and tries not to let anything get to him too much.  He has also been disappointed that he has not been able to exercise as much as he would like to do.  His sleep continues to be a struggle because he is constantly getting up to go to the bathroom throughout the night.  He tries to make the best of it.  He has found the exercise in the program to be very helpful and is helping get healthier as well.       Expected Outcomes  -  Short: Continue to attend rehab regularly. Long: Continue to work to improving health.      Interventions  -  Encouraged to attend Cardiac Rehabilitation for the exercise;Stress management education      Continue Psychosocial Services   -  Follow up required by staff        Initial Review   Source of Stress Concerns  -  Unable to  perform yard/household activities;Chronic Illness;Unable to participate in former interests or hobbies;Transportation         Psychosocial Discharge (Final Psychosocial Re-Evaluation): Psychosocial Re-Evaluation - 06/28/17 1422      Psychosocial Re-Evaluation   Current issues with  Current Stress Concerns    Comments  Tanush's health has continued to be his biggest stressor.  He feels that he does not have a lot of other stress and tries not to let anything get to him too much.  He has also been disappointed that he has not been able to exercise as much as he would like to do.  His sleep continues to be a struggle because he is constantly getting up to go to the bathroom throughout the night.  He tries to make the best of it.  He has found the exercise in the program to be very helpful and is helping get healthier as well.     Expected Outcomes  Short: Continue to attend rehab regularly. Long: Continue to work to improving health.    Interventions  Encouraged to attend Cardiac Rehabilitation for the exercise;Stress management education    Continue Psychosocial Services   Follow up required by staff      Initial Review   Source of Stress Concerns  Unable to perform yard/household activities;Chronic Illness;Unable to participate in former interests or hobbies;Transportation       Vocational Rehabilitation: Provide vocational rehab assistance to qualifying candidates.   Vocational Rehab Evaluation & Intervention: Vocational Rehab - 05/21/17 1206      Initial Vocational Rehab Evaluation & Intervention   Assessment shows need for Vocational Rehabilitation  No       Education: Education Goals: Education classes will be provided on  a variety of topics geared toward better understanding of heart health and risk factor modification. Participant will state understanding/return demonstration of topics presented as noted by education test scores.  Learning Barriers/Preferences: Learning  Barriers/Preferences - 05/21/17 1204      Learning Barriers/Preferences   Learning Barriers  Hearing    Learning Preferences  Group Instruction       Education Topics:  AED/CPR: - Group verbal and written instruction with the use of models to demonstrate the basic use of the AED with the basic ABC's of resuscitation.   General Nutrition Guidelines/Fats and Fiber: -Group instruction provided by verbal, written material, models and posters to present the general guidelines for heart healthy nutrition. Gives an explanation and review of dietary fats and fiber.   Controlling Sodium/Reading Food Labels: -Group verbal and written material supporting the discussion of sodium use in heart healthy nutrition. Review and explanation with models, verbal and written materials for utilization of the food label.   Exercise Physiology & General Exercise Guidelines: - Group verbal and written instruction with models to review the exercise physiology of the cardiovascular system and associated critical values. Provides general exercise guidelines with specific guidelines to those with heart or lung disease.    Cardiac Rehab from 08/21/2017 in [facility] Cardiac and Pulmonary Rehab  Date  06/28/17  Educator  [facility]  Instruction Review Code  1- Verbalizes Understanding      Aerobic Exercise & Resistance Training: - Gives group verbal and written instruction on the various components of exercise. Focuses on aerobic and resistive training programs and the benefits of this training and how to safely progress through these programs..   Cardiac Rehab from 08/21/2017 in [facility] Cardiac and Pulmonary Rehab  Date  07/05/17  Educator  [facility]  Instruction Review Code  1- Verbalizes Understanding      Flexibility, Balance, Mind/Body Relaxation: Provides group verbal/written instruction on the benefits of flexibility and balance training, including mind/body exercise modes such as yoga, pilates and tai chi.  Demonstration and  skill practice provided.   Cardiac Rehab from 08/21/2017 in [facility] Cardiac and Pulmonary Rehab  Date  07/10/17  Educator  [facility]  Instruction Review Code  1- Verbalizes Understanding      Stress and Anxiety: - Provides group verbal and written instruction about the health risks of elevated stress and causes of high stress.  Discuss the correlation between heart/lung disease and anxiety and treatment options. Review healthy ways to manage with stress and anxiety.   Depression: - Provides group verbal and written instruction on the correlation between heart/lung disease and depressed mood, treatment options, and the stigmas associated with seeking treatment.   Cardiac Rehab from 08/21/2017 in [facility] Cardiac and Pulmonary Rehab  Date  08/21/17  Educator  [facility]  Instruction Review Code  1- Verbalizes Understanding      Anatomy & Physiology of the Heart: - Group verbal and written instruction and models provide basic cardiac anatomy and physiology, with the coronary electrical and arterial systems. Review of Valvular disease and Heart Failure   Cardiac Procedures: - Group verbal and written instruction to review commonly prescribed medications for heart disease. Reviews the medication, class of the drug, and side effects. Includes the steps to properly store meds and maintain the prescription regimen. (beta blockers and nitrates)   Cardiac Medications I: - Group verbal and written instruction to review commonly prescribed medications for heart disease. Reviews the medication, class of the drug, and side effects. Includes the steps to properly store meds  and maintain the prescription regimen.   Cardiac Rehab from 08/21/2017 in [facility] Cardiac and Pulmonary Rehab  Date  07/17/17  Educator  [facility]  Instruction Review Code  1- Verbalizes Understanding      Cardiac Medications II: -Group verbal and written instruction to review commonly prescribed medications for heart disease. Reviews the medication, class  of the drug, and side effects. (all other drug classes)   Cardiac Rehab from 08/21/2017 in [facility] Cardiac and Pulmonary Rehab  Date  07/12/17  Educator  [facility]  Instruction Review Code  1- Verbalizes Understanding       Go Sex-Intimacy & Heart Disease, Get SMART - Goal Setting: - Group verbal and written instruction through game format to discuss heart disease and the return to sexual intimacy. Provides group verbal and written material to discuss and apply goal setting through the application of the S.M.A.R.T. Method.   Other Matters of the Heart: - Provides group verbal, written materials and models to describe Stable Angina and Peripheral Artery. Includes description of the disease process and treatment options available to the cardiac patient.   Exercise & Equipment Safety: - Individual verbal instruction and demonstration of equipment use and safety with use of the equipment.   Cardiac Rehab from 08/21/2017 in [facility] Cardiac and Pulmonary Rehab  Date  05/21/17  Educator  C.EnterkinRN  Instruction Review Code  3- Needs Reinforcement      Infection Prevention: - Provides verbal and written material to individual with discussion of infection control including proper hand washing and proper equipment cleaning during exercise session.   Cardiac Rehab from 08/21/2017 in [facility] Cardiac and Pulmonary Rehab  Date  05/21/17  Educator  Loletha Grayer ENterkinRN  Instruction Review Code  1- Verbalizes Understanding      Falls Prevention: - Provides verbal and written material to individual with discussion of falls prevention and safety.   Cardiac Rehab from 08/21/2017 in [facility] Cardiac and Pulmonary Rehab  Date  05/21/17  Educator  C. ENterkinRN  Instruction Review Code  1- Verbalizes Understanding      Diabetes: - Individual verbal and written instruction to review signs/symptoms of diabetes, desired ranges of glucose level fasting, after meals and with exercise. Acknowledge that pre and post exercise  glucose checks will be done for 3 sessions at entry of program.   Cardiac Rehab from 08/21/2017 in [facility] Cardiac and Pulmonary Rehab  Date  05/21/17  Educator  C. Orwin  Instruction Review Code  3- Needs Reinforcement      Know Your Numbers and Risk Factors: -Group verbal and written instruction about important numbers in your health.  Discussion of what are risk factors and how they play a role in the disease process.  Review of Cholesterol, Blood Pressure, Diabetes, and BMI and the role they play in your overall health.   Cardiac Rehab from 08/21/2017 in [facility] Cardiac and Pulmonary Rehab  Date  07/12/17  Educator  [facility]  Instruction Review Code  1- Verbalizes Understanding      Sleep Hygiene: -Provides group verbal and written instruction about how sleep can affect your health.  Define sleep hygiene, discuss sleep cycles and impact of sleep habits. Review good sleep hygiene tips.    Other: -Provides group and verbal instruction on various topics (see comments)   Knowledge Questionnaire Score: Knowledge Questionnaire Score - 05/23/17 0821      Knowledge Questionnaire Score   Pre Score  14/28 Reviewed with patient       Core Components/Risk Factors/Patient Goals at Admission:  Personal Goals and Risk Factors at Admission - 05/21/17 1224      Core Components/Risk Factors/Patient Goals on Admission    Weight Management  Yes;Obesity;Weight Loss    Intervention  Weight Management: Develop a combined nutrition and exercise program designed to reach desired caloric intake, while maintaining appropriate intake of nutrient and fiber, sodium and fats, and appropriate energy expenditure required for the weight goal.;Weight Management: Provide education and appropriate resources to help participant work on and attain dietary goals.;Weight Management/Obesity: Establish reasonable short term and long term weight goals.    Admit Weight  223 lb 6.4 oz (101.3 kg)    Goal Weight: Short Term  218  lb (98.9 kg)    Goal Weight: Long Term  213 lb (96.6 kg)    Expected Outcomes  Short Term: Continue to assess and modify interventions until short term weight is achieved;Long Term: Adherence to nutrition and physical activity/exercise program aimed toward attainment of established weight goal;Weight Loss: Understanding of general recommendations for a balanced deficit meal plan, which promotes 1-2 lb weight loss per week and includes a negative energy balance of [phone] kcal/d;Understanding recommendations for meals to include 15-35% energy as protein, 25-35% energy from fat, 35-60% energy from carbohydrates, less than '200mg'$  of dietary cholesterol, 20-35 gm of total fiber daily;Understanding of distribution of calorie intake throughout the day with the consumption of 4-5 meals/snacks    Diabetes  Yes    Intervention  Provide education about signs/symptoms and action to take for hypo/hyperglycemia.;Provide education about proper nutrition, including hydration, and aerobic/resistive exercise prescription along with prescribed medications to achieve blood glucose in normal ranges: Fasting glucose 65-99 mg/dL    Expected Outcomes  Short Term: Participant verbalizes understanding of the signs/symptoms and immediate care of hyper/hypoglycemia, proper foot care and importance of medication, aerobic/resistive exercise and nutrition plan for blood glucose control.;Long Term: Attainment of HbA1C < 7%.    Heart Failure  Yes    Intervention  Provide a combined exercise and nutrition program that is supplemented with education, support and counseling about heart failure. Directed toward relieving symptoms such as shortness of breath, decreased exercise tolerance, and extremity edema.    Expected Outcomes  Improve functional capacity of life;Short term: Attendance in program 2-3 days a week with increased exercise capacity. Reported lower sodium intake. Reported increased fruit and vegetable intake. Reports medication  compliance.;Short term: Daily weights obtained and reported for increase. Utilizing diuretic protocols set by physician.;Long term: Adoption of self-care skills and reduction of barriers for early signs and symptoms recognition and intervention leading to self-care maintenance.    Hypertension  Yes    Intervention  Provide education on lifestyle modifcations including regular physical activity/exercise, weight management, moderate sodium restriction and increased consumption of fresh fruit, vegetables, and low fat dairy, alcohol moderation, and smoking cessation.;Monitor prescription use compliance.    Expected Outcomes  Short Term: Continued assessment and intervention until BP is < 140/36m HG in hypertensive participants. < 130/862mHG in hypertensive participants with diabetes, heart failure or chronic kidney disease.;Long Term: Maintenance of blood pressure at goal levels.    Lipids  Yes    Intervention  Provide education and support for participant on nutrition & aerobic/resistive exercise along with prescribed medications to achieve LDL '70mg'$ , HDL >'40mg'$ .    Expected Outcomes  Short Term: Participant states understanding of desired cholesterol values and is compliant with medications prescribed. Participant is following exercise prescription and nutrition guidelines.;Long Term: Cholesterol controlled with medications as prescribed, with individualized exercise RX  and with personalized nutrition plan. Value goals: LDL < '70mg'$ , HDL > 40 mg.    Stress  Yes    Intervention  Offer individual and/or small group education and counseling on adjustment to heart disease, stress management and health-related lifestyle change. Teach and support self-help strategies.;Refer participants experiencing significant psychosocial distress to appropriate mental health specialists for further evaluation and treatment. When possible, include family members and significant others in education/counseling sessions.    Expected  Outcomes  Short Term: Participant demonstrates changes in health-related behavior, relaxation and other stress management skills, ability to obtain effective social support, and compliance with psychotropic medications if prescribed.;Long Term: Emotional wellbeing is indicated by absence of clinically significant psychosocial distress or social isolation.       Core Components/Risk Factors/Patient Goals Review:  Goals and Risk Factor Review    Row Name 06/28/17 1348             Core Components/Risk Factors/Patient Goals Review   Personal Goals Review  Weight Management/Obesity;Heart Failure;Hypertension;Diabetes;Lipids       Review  Harlen has been doing well in rehab.  His weight has been holding steady around 220 lbs.  He has not had any heart failure sysmtoms and continues to monitor his salt and fluid intake daily.  His blood pressure has been good, but he continues get lightheaded but not sure if could be his vertigo or something else.  He also continues to monitor his blood sugars daily.  He recently had a low of 89 fasting, which is the best it has been in a long time.         Expected Outcomes  Short: Continue to track weight and heart failure symptoms.  Long: Continue to work on risk factor modifications.           Core Components/Risk Factors/Patient Goals at Discharge (Final Review):  Goals and Risk Factor Review - 06/28/17 1348      Core Components/Risk Factors/Patient Goals Review   Personal Goals Review  Weight Management/Obesity;Heart Failure;Hypertension;Diabetes;Lipids    Review  Delonte has been doing well in rehab.  His weight has been holding steady around 220 lbs.  He has not had any heart failure sysmtoms and continues to monitor his salt and fluid intake daily.  His blood pressure has been good, but he continues get lightheaded but not sure if could be his vertigo or something else.  He also continues to monitor his blood sugars daily.  He recently had a low of 89  fasting, which is the best it has been in a long time.      Expected Outcomes  Short: Continue to track weight and heart failure symptoms.  Long: Continue to work on risk factor modifications.        ITP Comments: ITP Comments    Row Name 05/21/17 1212 06/05/17 1040 06/06/17 0600 06/07/17 1249 06/14/17 0931   ITP Comments  Roylee moved from [place] recently to be closer to family. He has recently moved in with his sister and brother in law who have a 58 year old autistic son in a group home.  Ahyan says he misses his 79 year old Grandchildren that he took care of every other weekend for his son and daughter in law. Gryphon admits that was his reason to quit drinking 13 years ago. He said he had his first heart attack 13 years ago about a month after he quit drinking a pint of liquor on Friday and a 5th or 2 on  Saturday night. Ziyon recently retired from working at [employer] for [employer] years and said he has been in and out of the hospital every since. Matt reports that he has never smoked. Gianni said he used to go to a large mall outside of [place] and sometimes walk 10 miles but is frustrated since he has still had heart problems including his recenlty NSTEMI/PTCA.  Richard said he doesn't eat like he should and isn't hungry much. He reports his blood sugars in the am are sometimes 250 but sometimes 120. Discussed maybe eating something since he takes long acting Metformin. The short acting Metformin is listed as an allergy since it causes diarrhea so much. Shamir carries his NTG sl and knows to discard it once it is opened per his MD he said he discards it after 6 months. He has a new bottle ready to start next week. Isaid given infor about [employer] and bus line in case his family can't drive him. Clance said he really doesn't want to drive at times since he has vertigo at times.   Atif recieved clearance to return to rehab this morning.  He was able to exercise without any  problems.   30 day review. Continue with ITP unless directed changes per Medical Director review.   Per ER report yesterday :Roemello Speyer Sr. is a 67 y.o. male with a history of colitis, diverticulitis as well as diabetes and coronary artery disease on Plavix who is presenting to the emergency department today with one episode of what he describes as "coffee ground stool."  He says that he has had 7 episodes of diarrhea today.  No recent antibiotics but multiple recent hospitalizations over the past several weeks.  Says that he has some diffuse abdominal cramping which is intermittent.  No pain at this time.  No nausea or vomiting.  Just discharged recently for congestive heart failure.  Says that he was discharged on Ranexa and has been experiencing some intermittent episodes of chest pain that are relieved by nitroglycerin.  However, he has been unable to afford the Ranexa and so has not been taking it.  He does take Imdur, 30 mg daily.  From Emerg Dept note today: The patient was at cardiac rehab, when he had a sharp chest pain that went from the lateral axillary line around towards the midline on the left side.  He then developed a lightheaded sensation with severe nausea but no vomiting.  He had associated cold and clammy feeling.  No palpitations, no syncope.  The patient states that his chest pain has now turned into a "dull ache."  He took one nitroglycerin without significant improvement.  He has not had any of his daily medications, including his aspirin.  The patient also reports that Friday, Saturday, and Sunday night, he was awoken from his sleep with chest pain and took 3 nitroglycerin overnight each night at approximately 2-hour intervals which improved his pain.  The patient was seen and evaluated here 12/5 for concern of "coffee-ground stool."  He was found to have guaiac negative testing and stable blood counts and was discharged home with a plan for outpatient GI follow-up.  The patient has not  had any further black or tarry, or bloody stools.   Row Name 07/04/17 2993 07/12/17 1113 07/23/17 1648 07/31/17 0840 08/01/17 0611   ITP Comments  30 day review. Continue with ITP unless directed changes per Medical Director review.   Cicero had trouble with vertigo today and could not  exercise as much.  He said he had some chest pain that was anxiety.  He stayed for the length of session and got in some intermittent exercise.  He was not able to do resistance training today. He called his ride to also bring his medication with to pick him up.  After resting for a bit, he was able to walk out on his own to meet his ride.  Chayton has been in the hospital for first degree heart block and NSTEMI.  He is home and feeling better.  He will need clearance in order to return to rehab.   Blessed has been granted clearance to return to rehab after his appointment today.  Dr. Fletcher Anon said that he did not have another heart attack and it was just indegistion.  He is cleared to return without restricitions.  30 Day review. Continue with ITP unless directed changes per Medical Director review.    [place] Name 08/07/17 1509 08/08/17 1407 08/21/17 0905 08/29/17 0618     ITP Comments  Called to check on status of return.  Dhairya was cleared to return by Dr. Fletcher Anon last week, but has not returned.  Upon chart review, he present to the ED after a fall at home with a chest contusion.  Unable to leave message at home or on mobile at this time.   Derrius called back.  He has the flu and soreness from his fall.  He is hoping to return on Tuesday.    Caron returned today after an extended absence.  He is feeling better.  30 day review. Continue with ITP unless directed changes per Medical Director review.   2 visits this month       Comments:

## 2017-08-30 ENCOUNTER — Ambulatory Visit (INDEPENDENT_AMBULATORY_CARE_PROVIDER_SITE_OTHER): Payer: BLUE CROSS/BLUE SHIELD

## 2017-08-30 ENCOUNTER — Encounter: Payer: Self-pay | Admitting: Physician Assistant

## 2017-08-30 ENCOUNTER — Ambulatory Visit: Payer: BLUE CROSS/BLUE SHIELD | Admitting: Physician Assistant

## 2017-08-30 ENCOUNTER — Encounter: Payer: Self-pay | Admitting: *Deleted

## 2017-08-30 ENCOUNTER — Telehealth: Payer: Self-pay | Admitting: *Deleted

## 2017-08-30 VITALS — BP 106/54 | HR 69 | Ht 69.0 in | Wt 214.5 lb

## 2017-08-30 DIAGNOSIS — I214 Non-ST elevation (NSTEMI) myocardial infarction: Secondary | ICD-10-CM

## 2017-08-30 DIAGNOSIS — F419 Anxiety disorder, unspecified: Secondary | ICD-10-CM | POA: Diagnosis not present

## 2017-08-30 DIAGNOSIS — I10 Essential (primary) hypertension: Secondary | ICD-10-CM | POA: Diagnosis not present

## 2017-08-30 DIAGNOSIS — I255 Ischemic cardiomyopathy: Secondary | ICD-10-CM

## 2017-08-30 DIAGNOSIS — I481 Persistent atrial fibrillation: Secondary | ICD-10-CM

## 2017-08-30 DIAGNOSIS — E785 Hyperlipidemia, unspecified: Secondary | ICD-10-CM

## 2017-08-30 DIAGNOSIS — Z952 Presence of prosthetic heart valve: Secondary | ICD-10-CM | POA: Diagnosis not present

## 2017-08-30 DIAGNOSIS — I25118 Atherosclerotic heart disease of native coronary artery with other forms of angina pectoris: Secondary | ICD-10-CM | POA: Diagnosis not present

## 2017-08-30 DIAGNOSIS — Z9861 Coronary angioplasty status: Secondary | ICD-10-CM

## 2017-08-30 DIAGNOSIS — I5022 Chronic systolic (congestive) heart failure: Secondary | ICD-10-CM

## 2017-08-30 DIAGNOSIS — I4819 Other persistent atrial fibrillation: Secondary | ICD-10-CM

## 2017-08-30 MED ORDER — RANOLAZINE ER 1000 MG PO TB12: 1000.0000 mg | ORAL_TABLET | Freq: Two times a day (BID) | ORAL | 3 refills | Status: AC

## 2017-08-30 MED ORDER — ISOSORBIDE MONONITRATE ER 60 MG PO TB24: 60.0000 mg | ORAL_TABLET | Freq: Every day | ORAL | 3 refills | Status: AC

## 2017-08-30 MED ORDER — ATORVASTATIN CALCIUM 80 MG PO TABS: 80.0000 mg | ORAL_TABLET | Freq: Every day | ORAL | 3 refills | Status: AC

## 2017-08-30 MED ORDER — METOPROLOL SUCCINATE ER 50 MG PO TB24: ORAL_TABLET | ORAL | 3 refills | Status: AC

## 2017-08-30 MED ORDER — CLOPIDOGREL BISULFATE 75 MG PO TABS: 75.0000 mg | ORAL_TABLET | Freq: Every day | ORAL | 3 refills | Status: AC

## 2017-08-30 MED ORDER — DIGOXIN 125 MCG PO TABS: 0.0625 mg | ORAL_TABLET | Freq: Every day | ORAL | 3 refills | Status: AC

## 2017-08-30 MED ORDER — SPIRONOLACTONE 25 MG PO TABS: 12.5000 mg | ORAL_TABLET | Freq: Every day | ORAL | 3 refills | Status: AC

## 2017-08-30 NOTE — Telephone Encounter (Signed)
Leonette MostCharles stopped by to let us that he was cleared to return to rehab.  He will be wearing a monitor.

## 2017-08-30 NOTE — Patient Instructions (Signed)
Medication Instructions:  Your physician recommends that you continue on your current medications as directed. Please refer to the Current Medication list given to you today.   Labwork: Your physician recommends that you return for lab work in: TODAY (DIGOXIN, CBC, BMET)   Testing/Procedures: Your physician has recommended that you wear an 14 DAY ZIO event monitor. Event monitors are medical devices that record the heart's electrical activity. Doctors most often us these monitors to diagnose arrhythmias. Arrhythmias are problems with the speed or rhythm of the heartbeat. The monitor is a small, portable device. You can wear one while you do your normal daily activities. This is usually used to diagnose what is causing palpitations/syncope (passing out).  A Zio Patch Event Heart monitor will be applied to your chest today.  You will wear the patch for 14 days. After 24 hours, you may shower with the heart monitor on. If you feel any SYMPTOMS, you may press and release the button in the middle of the monitor.    Follow-Up: Your physician recommends that you schedule a follow-up appointment in: 1 MONTH WITH RYAN.  If you need a refill on your cardiac medications before your next appointment, please call your pharmacy.

## 2017-08-31 ENCOUNTER — Telehealth: Payer: Self-pay | Admitting: Physician Assistant

## 2017-08-31 ENCOUNTER — Inpatient Hospital Stay: Payer: BLUE CROSS/BLUE SHIELD | Admitting: Nurse Practitioner

## 2017-08-31 DIAGNOSIS — I255 Ischemic cardiomyopathy: Secondary | ICD-10-CM

## 2017-08-31 LAB — CBC WITH DIFFERENTIAL/PLATELET
BASOS ABS: 0 10*3/uL (ref 0.0–0.2)
Basos: 1 %
EOS (ABSOLUTE): 0.2 10*3/uL (ref 0.0–0.4)
Eos: 2 %
HEMOGLOBIN: 14.2 g/dL (ref 13.0–17.7)
Hematocrit: 40.9 % (ref 37.5–51.0)
IMMATURE GRANS (ABS): 0 10*3/uL (ref 0.0–0.1)
IMMATURE GRANULOCYTES: 0 %
LYMPHS: 16 %
Lymphocytes Absolute: 1.3 10*3/uL (ref 0.7–3.1)
MCH: 28.5 pg (ref 26.6–33.0)
MCHC: 34.7 g/dL (ref 31.5–35.7)
MCV: 82 fL (ref 79–97)
MONOCYTES: 9 %
Monocytes Absolute: 0.8 10*3/uL (ref 0.1–0.9)
NEUTROS ABS: 6.2 10*3/uL (ref 1.4–7.0)
NEUTROS PCT: 72 %
PLATELETS: 215 10*3/uL (ref 150–379)
RBC: 4.99 x10E6/uL (ref 4.14–5.80)
RDW: 15.3 % (ref 12.3–15.4)
WBC: 8.5 10*3/uL (ref 3.4–10.8)

## 2017-08-31 LAB — BASIC METABOLIC PANEL
BUN/Creatinine Ratio: 15 (ref 10–24)
BUN: 13 mg/dL (ref 8–27)
CHLORIDE: 100 mmol/L (ref 96–106)
CO2: 23 mmol/L (ref 20–29)
Calcium: 9.9 mg/dL (ref 8.6–10.2)
Creatinine, Ser: 0.87 mg/dL (ref 0.76–1.27)
GFR, EST AFRICAN AMERICAN: 105 mL/min/{1.73_m2} (ref >59)
GFR, EST NON AFRICAN AMERICAN: 91 mL/min/{1.73_m2} (ref >59)
Glucose: 236 mg/dL — ABNORMAL HIGH (ref 65–99)
Potassium: 4 mmol/L (ref 3.5–5.2)
Sodium: 139 mmol/L (ref 134–144)

## 2017-08-31 LAB — DIGOXIN LEVEL: DIGOXIN, SERUM: 1.2 ng/mL — AB (ref 0.5–0.9)

## 2017-08-31 NOTE — Telephone Encounter (Signed)
Notes recorded by Jefferey PicaMcGhee, Calena Salem C, RN on 08/31/2017 at 5:09 PM EST The patient is aware of his results. He verbalizes understanding to hold digoxin for now and have a repeat digoxin level on Monday 09/03/17. He is also aware not to resume this until we call him back with his results and further recommendations. He will [facility] his [facility] mall on Monday. ------  Notes recorded by Sondra Bargesunn, Ryan M, PA-C on 08/31/2017 at 7:51 AM EST Please call the patient. Digoxin level is too high. Please have him hold digoxin for now.  Recheck digoxin level on 3/4.  Blood count normal.  Renal function normal.  Potassium at goal.

## 2017-09-03 ENCOUNTER — Telehealth: Payer: Self-pay | Admitting: Nurse Practitioner

## 2017-09-03 ENCOUNTER — Telehealth: Payer: Self-pay | Admitting: Cardiovascular Disease

## 2017-09-03 NOTE — Telephone Encounter (Signed)
Okay thank you

## 2017-09-03 NOTE — Telephone Encounter (Signed)
Pt moved to [place] and will be sending for his records when he get a new PCP [phone]

## 2017-09-03 NOTE — Telephone Encounter (Signed)
Routed to Dr. Kirke CorinArida to make aware.

## 2017-09-03 NOTE — Telephone Encounter (Signed)
Pt is calling to let us know he has moved back to [place]. He wanted to express his appreciation to Dr. Kirke CorinArida for his care for him. States he will mail his heart monitor back.

## 2017-09-04 ENCOUNTER — Encounter: Payer: BLUE CROSS/BLUE SHIELD | Attending: Cardiovascular Disease

## 2017-09-04 ENCOUNTER — Telehealth: Payer: Self-pay | Admitting: *Deleted

## 2017-09-04 ENCOUNTER — Encounter: Payer: Self-pay | Admitting: *Deleted

## 2017-09-04 DIAGNOSIS — I10 Essential (primary) hypertension: Secondary | ICD-10-CM | POA: Insufficient documentation

## 2017-09-04 DIAGNOSIS — I2510 Atherosclerotic heart disease of native coronary artery without angina pectoris: Secondary | ICD-10-CM | POA: Insufficient documentation

## 2017-09-04 DIAGNOSIS — Z7984 Long term (current) use of oral hypoglycemic drugs: Secondary | ICD-10-CM | POA: Insufficient documentation

## 2017-09-04 DIAGNOSIS — Z9861 Coronary angioplasty status: Secondary | ICD-10-CM

## 2017-09-04 DIAGNOSIS — I252 Old myocardial infarction: Secondary | ICD-10-CM | POA: Insufficient documentation

## 2017-09-04 DIAGNOSIS — E785 Hyperlipidemia, unspecified: Secondary | ICD-10-CM | POA: Insufficient documentation

## 2017-09-04 DIAGNOSIS — Z7902 Long term (current) use of antithrombotics/antiplatelets: Secondary | ICD-10-CM | POA: Insufficient documentation

## 2017-09-04 DIAGNOSIS — E119 Type 2 diabetes mellitus without complications: Secondary | ICD-10-CM | POA: Insufficient documentation

## 2017-09-04 DIAGNOSIS — Z79899 Other long term (current) drug therapy: Secondary | ICD-10-CM | POA: Insufficient documentation

## 2017-09-04 DIAGNOSIS — Z955 Presence of coronary angioplasty implant and graft: Secondary | ICD-10-CM | POA: Insufficient documentation

## 2017-09-04 DIAGNOSIS — Z7982 Long term (current) use of aspirin: Secondary | ICD-10-CM | POA: Insufficient documentation

## 2017-09-04 DIAGNOSIS — I214 Non-ST elevation (NSTEMI) myocardial infarction: Secondary | ICD-10-CM

## 2017-09-04 NOTE — Telephone Encounter (Signed)
Called to check on pt status.  Unable to leave message as there is no voicemail box.  Other telephone encounters show that he has called to let offices know that he has moved to [place].

## 2017-09-04 NOTE — Progress Notes (Signed)
Discharge Progress Report  Patient Details  Name: Christopher Molstad Sr. MRN: [id number] Date of Birth: 1951/03/15 Referring Provider:     Cardiac Rehab from 05/21/2017 in [facility]  Referring Provider  Lorine Bears MD       Number of Visits: 18/36  Reason for Discharge:  Early Exit:  Moved to [place]  Smoking History:  Social History   Tobacco Use  Smoking Status Never Smoker  Smokeless Tobacco Never Used    Diagnosis:  NSTEMI (non-ST elevated myocardial infarction) (HCC)  S/P PTCA (percutaneous transluminal coronary angioplasty)  ADL [facility]SD:   Initial Exercise Prescription: Initial Exercise Prescription - 05/21/17 1200      Date of Initial Exercise RX and Referring Provider   Date  05/21/17    Referring Provider  Lorine Bears MD      Recumbant Bike   Level  2    RPM  50    Watts  16    Minutes  15    METs  2.5      NuStep   Level  2    SPM  80    Minutes  15    METs  2.5      Track   Laps  32    Minutes  15    METs  2.45      Prescription Details   Frequency (times per week)  3    Duration  Progress to 45 minutes of aerobic exercise without signs/symptoms of physical distress      Intensity   THRR 40-80% of Max Heartrate  110-141    Ratings of Perceived Exertion  11-13    Perceived Dyspnea  0-4      Progression   Progression  Continue to progress workloads to maintain intensity without signs/symptoms of physical distress.      Resistance Training   Training Prescription  Yes    Weight  3 lbs    Reps  10-15       Discharge Exercise Prescription (Final Exercise Prescription Changes): Exercise Prescription Changes - 08/21/17 1500      Response to Exercise   Blood Pressure (Admit)  122/66    Blood Pressure (Exercise)  130/72    Blood Pressure (Exit)  112/60    Heart Rate (Admit)  65 bpm    Heart Rate (Exercise)  97 bpm    Heart Rate (Exit)  71 bpm    Rating of Perceived Exertion (Exercise)  11    Symptoms  none    Duration  Continue with 30 min of aerobic exercise without signs/symptoms of physical distress.    Intensity  THRR unchanged      Progression   Progression  Continue to progress workloads to maintain intensity without signs/symptoms of physical distress.    Average METs  2.19      Resistance Training   Training Prescription  Yes    Weight  5 lbs    Reps  10-15      Interval Training   Interval Training  No      NuStep   Level  2    Minutes  15    METs  3      Track   Laps  30    Minutes  15    METs  2.38      Home Exercise Plan   Plans to continue exercise at  Home (comment) walk or at the mall    Frequency  Add 1  additional day to program exercise sessions.    Initial Home Exercises Provided  06/07/17       Functional Capacity: 6 Minute Walk    Row Name 05/21/17 1221         6 Minute Walk   Phase  Initial     Distance  1165 feet     Walk Time  6 minutes     # of Rest Breaks  0     MPH  2.21     METS  2.51     RPE  7     VO2 Peak  8.8     Symptoms  No     Resting HR  80 bpm     Resting BP  132/70     Resting Oxygen Saturation   96 %     Exercise Oxygen Saturation  during 6 min walk  97 %     Max Ex. HR  115 bpm     Max Ex. BP  132/76     2 Minute Post BP  126/60        Psychological, QOL, Others - Outcomes: PHQ 2/9: Depression screen [facility] 2/9 07/04/2017 05/21/2017 05/11/2017 04/30/2017  Decreased Interest 0 0 0 0  Down, Depressed, Hopeless 0 0 0 0  PHQ - 2 Score 0 0 0 0  Altered sleeping - 1 - -  Tired, decreased energy - 1 - -  Change in appetite - 1 - -  Feeling bad or failure about yourself  - 0 - -  Trouble concentrating - 0 - -  Moving slowly or fidgety/restless - 2 - -  Suicidal thoughts - 0 - -  PHQ-9 Score - 5 - -  Difficult doing work/chores - Somewhat difficult - -    Quality of Life: Quality of Life - 05/21/17 1225      Quality of Life Scores   Health/Function Pre  18.43 %    Socioeconomic Pre  25.64 %     Psych/Spiritual Pre  24 %    Family Pre  27 %    GLOBAL Pre  22.18 %       Personal Goals: Goals established at orientation with interventions provided to work toward goal. Personal Goals and Risk Factors at Admission - 05/21/17 1224      Core Components/Risk Factors/Patient Goals on Admission    Weight Management  Yes;Obesity;Weight Loss    Intervention  Weight Management: Develop a combined nutrition and exercise program designed to reach desired caloric intake, while maintaining appropriate intake of nutrient and fiber, sodium and fats, and appropriate energy expenditure required for the weight goal.;Weight Management: Provide education and appropriate resources to help participant work on and attain dietary goals.;Weight Management/Obesity: Establish reasonable short term and long term weight goals.    Admit Weight  223 lb 6.4 oz (101.3 kg)    Goal Weight: Short Term  218 lb (98.9 kg)    Goal Weight: Long Term  213 lb (96.6 kg)    Expected Outcomes  Short Term: Continue to assess and modify interventions until short term weight is achieved;Long Term: Adherence to nutrition and physical activity/exercise program aimed toward attainment of established weight goal;Weight Loss: Understanding of general recommendations for a balanced deficit meal plan, which promotes 1-2 lb weight loss per week and includes a negative energy balance of [phone] kcal/d;Understanding recommendations for meals to include 15-35% energy as protein, 25-35% energy from fat, 35-60% energy from carbohydrates, less than 200mg  of dietary cholesterol,  20-35 gm of total fiber daily;Understanding of distribution of calorie intake throughout the day with the consumption of 4-5 meals/snacks    Diabetes  Yes    Intervention  Provide education about signs/symptoms and action to take for hypo/hyperglycemia.;Provide education about proper nutrition, including hydration, and aerobic/resistive exercise prescription along with prescribed  medications to achieve blood glucose in normal ranges: Fasting glucose 65-99 mg/dL    Expected Outcomes  Short Term: Participant verbalizes understanding of the signs/symptoms and immediate care of hyper/hypoglycemia, proper foot care and importance of medication, aerobic/resistive exercise and nutrition plan for blood glucose control.;Long Term: Attainment of HbA1C < 7%.    Heart Failure  Yes    Intervention  Provide a combined exercise and nutrition program that is supplemented with education, support and counseling about heart failure. Directed toward relieving symptoms such as shortness of breath, decreased exercise tolerance, and extremity edema.    Expected Outcomes  Improve functional capacity of life;Short term: Attendance in program 2-3 days a week with increased exercise capacity. Reported lower sodium intake. Reported increased fruit and vegetable intake. Reports medication compliance.;Short term: Daily weights obtained and reported for increase. Utilizing diuretic protocols set by physician.;Long term: Adoption of self-care skills and reduction of barriers for early signs and symptoms recognition and intervention leading to self-care maintenance.    Hypertension  Yes    Intervention  Provide education on lifestyle modifcations including regular physical activity/exercise, weight management, moderate sodium restriction and increased consumption of fresh fruit, vegetables, and low fat dairy, alcohol moderation, and smoking cessation.;Monitor prescription use compliance.    Expected Outcomes  Short Term: Continued assessment and intervention until BP is < 140/49mm HG in hypertensive participants. < 130/53mm HG in hypertensive participants with diabetes, heart failure or chronic kidney disease.;Long Term: Maintenance of blood pressure at goal levels.    Lipids  Yes    Intervention  Provide education and support for participant on nutrition & aerobic/resistive exercise along with prescribed  medications to achieve LDL 70mg , HDL >40mg .    Expected Outcomes  Short Term: Participant states understanding of desired cholesterol values and is compliant with medications prescribed. Participant is following exercise prescription and nutrition guidelines.;Long Term: Cholesterol controlled with medications as prescribed, with individualized exercise RX and with personalized nutrition plan. Value goals: LDL < 70mg , HDL > 40 mg.    Stress  Yes    Intervention  Offer individual and/or small group education and counseling on adjustment to heart disease, stress management and health-related lifestyle change. Teach and support self-help strategies.;Refer participants experiencing significant psychosocial distress to appropriate mental health specialists for further evaluation and treatment. When possible, include family members and significant others in education/counseling sessions.    Expected Outcomes  Short Term: Participant demonstrates changes in health-related behavior, relaxation and other stress management skills, ability to obtain effective social support, and compliance with psychotropic medications if prescribed.;Long Term: Emotional wellbeing is indicated by absence of clinically significant psychosocial distress or social isolation.        Personal Goals Discharge: Goals and Risk Factor Review    Row Name 06/28/17 1348             Core Components/Risk Factors/Patient Goals Review   Personal Goals Review  Weight Management/Obesity;Heart Failure;Hypertension;Diabetes;Lipids       Review  Keifer has been doing well in rehab.  His weight has been holding steady around 220 lbs.  He has not had any heart failure sysmtoms and continues to monitor his salt and fluid intake daily.  His blood pressure has been good, but he continues get lightheaded but not sure if could be his vertigo or something else.  He also continues to monitor his blood sugars daily.  He recently had a low of 89 fasting,  which is the best it has been in a long time.         Expected Outcomes  Short: Continue to track weight and heart failure symptoms.  Long: Continue to work on risk factor modifications.           Exercise Goals and Review: Exercise Goals    Row Name 05/21/17 1224             Exercise Goals   Increase Physical Activity  Yes       Intervention  Provide advice, education, support and counseling about physical activity/exercise needs.;Develop an individualized exercise prescription for aerobic and resistive training based on initial evaluation findings, risk stratification, comorbidities and participant's personal goals.       Expected Outcomes  Achievement of increased cardiorespiratory fitness and enhanced flexibility, muscular endurance and strength shown through measurements of functional capacity and personal statement of participant.       Increase Strength and Stamina  Yes       Intervention  Provide advice, education, support and counseling about physical activity/exercise needs.;Develop an individualized exercise prescription for aerobic and resistive training based on initial evaluation findings, risk stratification, comorbidities and participant's personal goals.       Expected Outcomes  Achievement of increased cardiorespiratory fitness and enhanced flexibility, muscular endurance and strength shown through measurements of functional capacity and personal statement of participant.       Able to understand and use rate of perceived exertion (RPE) scale  Yes       Intervention  Provide education and explanation on how to use RPE scale       Expected Outcomes  Short Term: Able to use RPE daily in rehab to express subjective intensity level;Long Term:  Able to use RPE to guide intensity level when exercising independently       Knowledge and understanding of Target Heart Rate Range (THRR)  Yes       Intervention  Provide education and explanation of THRR including how the numbers were  predicted and where they are located for reference       Expected Outcomes  Short Term: Able to state/look up THRR;Long Term: Able to use THRR to govern intensity when exercising independently;Short Term: Able to use daily as guideline for intensity in rehab       Able to check pulse independently  Yes       Intervention  Provide education and demonstration on how to check pulse in carotid and radial arteries.;Review the importance of being able to check your own pulse for safety during independent exercise       Expected Outcomes  Short Term: Able to explain why pulse checking is important during independent exercise;Long Term: Able to check pulse independently and accurately       Understanding of Exercise Prescription  Yes       Intervention  Provide education, explanation, and written materials on patient's individual exercise prescription       Expected Outcomes  Short Term: Able to explain program exercise prescription;Long Term: Able to explain home exercise prescription to exercise independently          Nutrition & Weight - Outcomes: Pre Biometrics - 05/21/17 1225      Pre Biometrics  Height  5' 9.25" (1.759 m)    Weight  223 lb 6.4 oz (101.3 kg)    Waist Circumference  43.5 inches    Hip Circumference  41 inches    Waist to Hip Ratio  1.06 %    BMI (Calculated)  32.75    Single Leg Stand  1.22 seconds        Nutrition: Nutrition Therapy & Goals - 07/05/17 1046      Nutrition Therapy   Diet  TLC    Drug/Food Interactions  Statins/Certain Fruits    Protein (specify units)  12oz    Fiber  30 grams    Saturated Fats  16 max. grams    Sodium  1500 grams      Personal Nutrition Goals   Nutrition Goal  Continue to increase daily walking milage    Personal Goal #2  Try consuming 4oz of fruit juice or a couple pieces of hard candy such as life savers when blood sugar readings are low, wait 15min and test blood glucose again    Personal Goal #3  Choose white meats when eating  out more often to help reduce total sodium and fat in the meal    Comments  patient is a diabetic but does not follow a strict diabetic diet. tries to eat more vegetables than starches. does not eat consistently throughout the day      Intervention Plan   Intervention  Prescribe, educate and counsel regarding individualized specific dietary modifications aiming towards targeted core components such as weight, hypertension, lipid management, diabetes, heart failure and other comorbidities.    Expected Outcomes  Short Term Goal: Understand basic principles of dietary content, such as calories, fat, sodium, cholesterol and nutrients.;Short Term Goal: A plan has been developed with personal nutrition goals set during dietitian appointment.;Long Term Goal: Adherence to prescribed nutrition plan.       Nutrition Discharge: Nutrition Assessments - 05/21/17 1221      MEDFICTS Scores   Pre Score  24       Education Questionnaire Score: Knowledge Questionnaire Score - 05/23/17 [id number]      Knowledge Questionnaire Score   Pre Score  14/28 Reviewed with patient       Goals reviewed with patient; copy given to patient.

## 2017-09-04 NOTE — Progress Notes (Signed)
Cardiac Individual Treatment Plan  Patient Details  Name: Christopher Cummings. MRN: [id number] Date of Birth: 13-Feb-1951 Referring Provider:     Cardiac Rehab from 05/21/2017 in [facility] Cardiac and Pulmonary Rehab  Referring Provider  Kathlyn Sacramento MD      Initial Encounter Date:    Cardiac Rehab from 05/21/2017 in [facility] Cardiac and Pulmonary Rehab  Date  05/21/17  Referring Provider  Kathlyn Sacramento MD      Visit Diagnosis: NSTEMI (non-ST elevated myocardial infarction) ([place])  S/P PTCA (percutaneous transluminal coronary angioplasty)  Patient's Home Medications on Admission:  Current Outpatient Medications:  .  acetaminophen (TYLENOL) 325 MG tablet, Take 2 tablets (650 mg total) by mouth every 6 (six) hours as needed for mild pain (or Fever >/= 101)., Disp: , Rfl:  .  ALPRAZolam (XANAX) 0.25 MG tablet, Take 1 tablet (0.25 mg total) by mouth at bedtime as needed for anxiety., Disp: 30 tablet, Rfl: 0 .  alum & mag hydroxide-simeth (MAALOX/MYLANTA) 200-200-20 MG/5ML suspension, Take 30 mLs by mouth every 6 (six) hours as needed for indigestion or heartburn., Disp: 355 mL, Rfl: 0 .  aspirin EC 81 MG tablet, Take 1 tablet (81 mg total) by mouth daily., Disp: , Rfl:  .  atorvastatin (LIPITOR) 80 MG tablet, Take 1 tablet (80 mg total) by mouth at bedtime., Disp: 90 tablet, Rfl: 3 .  blood glucose meter kit and supplies, Dispense based on patient and insurance preference. Use 1 time daily as directed. (FOR ICD-9 250.00, 250.01)., Disp: 1 each, Rfl: 0 .  Blood Pressure Monitoring (BLOOD PRESSURE CUFF) MISC, 1 Units by Does not apply route daily., Disp: 1 each, Rfl: 0 .  clopidogrel (PLAVIX) 75 MG tablet, Take 1 tablet (75 mg total) by mouth daily., Disp: 90 tablet, Rfl: 3 .  cyclobenzaprine (FLEXERIL) 5 MG tablet, Take 1 tablet (5 mg total) by mouth 3 (three) times daily as needed for muscle spasms., Disp: 20 tablet, Rfl: 0 .  digoxin (LANOXIN) 0.125 MG tablet, Take 0.5 tablets (0.0625 mg  total) by mouth daily., Disp: 45 tablet, Rfl: 3 .  docusate sodium (COLACE) 100 MG capsule, Take 1 capsule (100 mg total) by mouth 2 (two) times daily., Disp: 60 capsule, Rfl: 5 .  empagliflozin (JARDIANCE) 25 MG TABS tablet, Take 25 mg daily by mouth., Disp: 30 tablet, Rfl: 5 .  HYDROcodone-acetaminophen (NORCO/VICODIN) 5-325 MG tablet, Take 1 tablet by mouth every 4 (four) hours as needed for moderate pain., Disp: 8 tablet, Rfl: 0 .  isosorbide mononitrate (IMDUR) 60 MG 24 hr tablet, Take 1 tablet (60 mg total) by mouth daily., Disp: 90 tablet, Rfl: 3 .  meclizine (ANTIVERT) 25 MG tablet, Take 25 mg by mouth 3 (three) times daily as needed for dizziness., Disp: , Rfl:  .  metFORMIN (GLUCOPHAGE-XR) 500 MG 24 hr tablet, Take 1 tablet (500 mg total) daily with breakfast by mouth., Disp: 30 tablet, Rfl: 5 .  metoprolol succinate (TOPROL-XL) 50 MG 24 hr tablet, Take one tablet (50 mg) daily if systolic blood pressure is >100, Disp: 90 tablet, Rfl: 3 .  nitroGLYCERIN (NITROSTAT) 0.4 MG SL tablet, Place 1 tablet (0.4 mg total) under the tongue every 5 (five) minutes as needed for chest pain., Disp: 25 tablet, Rfl: 0 .  ondansetron (ZOFRAN ODT) 4 MG disintegrating tablet, Take 1 tablet (4 mg total) by mouth every 8 (eight) hours as needed for nausea or vomiting., Disp: 20 tablet, Rfl: 0 .  pantoprazole (PROTONIX) 40 MG tablet, Take 1  tablet (40 mg total) daily by mouth., Disp: 90 tablet, Rfl: 3 .  predniSONE (DELTASONE) 50 MG tablet, Take 1 tablet (50 mg total) by mouth daily with breakfast., Disp: 5 tablet, Rfl: 0 .  ranolazine (RANEXA) 1000 MG Cummings tablet, Take 1 tablet (1,000 mg total) by mouth 2 (two) times daily., Disp: 180 tablet, Rfl: 3 .  spironolactone (ALDACTONE) 25 MG tablet, Take 0.5 tablets (12.5 mg total) by mouth daily., Disp: 45 tablet, Rfl: 3  Past Medical History: Past Medical History:  Diagnosis Date  . Aortic stenosis    a. 07/2015 s/p bioprosthetic AVR (#23 [employer]) - [place];  b. 04/2017 Echo: nl fxn'ing AoV.  Marland Kitchen CAD (coronary artery disease)    a. 2009 PCI->D1; b. 07/2015 CABG x 2 reported (LIMA-LAD noted on cath 01/2017) [facility] [place]; c. 2017/2018 Prox/Dist LCX & D1 stenting; d. 01/2017 PCI: ISR prox/dist LCX stents (PTCA), ISR D1 (med Rx); e. 04/2017 PCI: CBA for ISR- LCX 90p, 70d, D1 95; f. 05/2017 PCI: D1 99 ISR, LCX 70p (2.25x22 On[employer] DES); g. 06/2017 Cath: Patent LCX and RCA stents. D1 100 ISR->Med Rx.  . Chronic systolic CHF (congestive heart failure) ([place])    a. 04/2017 Echo: EF 35-40% with nl functioning bioprosthetic AoV, mild MR, nl [place].  . Colitis   . Diabetes mellitus with complication ([place])   . Diverticulitis   . Essential hypertension   . Hyperlipidemia   . Ischemic cardiomyopathy    a. 04/2017 Echo: EF 35-40%.  . Permanent atrial fibrillation ([place])    a. CHA2DS2VASc = 4-->No OAC 2/2 ongoing ASA/Plavix and h/o rectal bleeding.  . Rectal bleeding    a. 03/2017 -> f/u @ [facility].    Tobacco Use: Social History   Tobacco Use  Smoking Status Never Smoker  Smokeless Tobacco Never Used    Labs: Recent Review Flowsheet Data    Labs for ITP Cardiac and Pulmonary Rehab Latest Ref Rng & Units 02/25/2017 03/31/2017 04/25/2017 05/07/2017   Cholestrol 0 - 200 mg/dL 89 108 80 -   LDLCALC 0 - 99 mg/dL 42 43 36 -   HDL >40 mg/dL 35(L) 37(L) 29(L) -   Trlycerides <150 mg/dL 60 138 77 -   Hemoglobin A1c 4.8 - 5.6 % - 8.1(H) - 9.3(H)       Exercise Target Goals:    Exercise Program Goal: Individual exercise prescription set using results from initial 6 min walk test and THRR while considering  patient's activity barriers and safety.   Exercise Prescription Goal: Initial exercise prescription builds to 30-45 minutes a day of aerobic activity, 2-3 days per week.  Home exercise guidelines will be given to patient during program as part of exercise prescription that the participant will acknowledge.  Activity Barriers & Risk Stratification: Activity  Barriers & Cardiac Risk Stratification - 05/21/17 1222      Activity Barriers & Cardiac Risk Stratification   Activity Barriers  Other (comment);Deconditioning;Muscular Weakness;Balance Concerns;Decreased Ventricular Function    Comments  Vertigo    Cardiac Risk Stratification  High       6 Minute Walk: 6 Minute Walk    Row Name 05/21/17 1221         6 Minute Walk   Phase  Initial     Distance  1165 feet     Walk Time  6 minutes     # of Rest Breaks  0     MPH  2.21     METS  2.51     RPE  7     VO2 Peak  8.8     Symptoms  No     Resting HR  80 bpm     Resting BP  132/70     Resting Oxygen Saturation   96 %     Exercise Oxygen Saturation  during 6 min walk  97 %     Max Ex. HR  115 bpm     Max Ex. BP  132/76     2 Minute Post BP  126/60        Oxygen Initial Assessment:   Oxygen Re-Evaluation:   Oxygen Discharge (Final Oxygen Re-Evaluation):   Initial Exercise Prescription: Initial Exercise Prescription - 05/21/17 1200      Date of Initial Exercise RX and Referring Provider   Date  05/21/17    Referring Provider  Kathlyn Sacramento MD      Recumbant Bike   Level  2    RPM  50    Watts  16    Minutes  15    METs  2.5      NuStep   Level  2    SPM  80    Minutes  15    METs  2.5      Track   Laps  32    Minutes  15    METs  2.45      Prescription Details   Frequency (times per week)  3    Duration  Progress to 45 minutes of aerobic exercise without signs/symptoms of physical distress      Intensity   THRR 40-80% of Max Heartrate  110-141    Ratings of Perceived Exertion  11-13    Perceived Dyspnea  0-4      Progression   Progression  Continue to progress workloads to maintain intensity without signs/symptoms of physical distress.      Resistance Training   Training Prescription  Yes    Weight  3 lbs    Reps  10-15       Perform Capillary Blood Glucose checks as needed.  Exercise Prescription Changes: Exercise Prescription Changes     Row Name 05/21/17 1200 05/29/17 1300 06/07/17 0900 06/13/17 1300 06/28/17 1400     Response to Exercise   Blood Pressure (Admit)  132/70  124/72  -  124/64  124/56   Blood Pressure (Exercise)  132/76  130/72  -  126/84  124/56   Blood Pressure (Exit)  126/60  126/64  -  120/62  118/60   Heart Rate (Admit)  80 bpm  101 bpm  -  96 bpm  93 bpm   Heart Rate (Exercise)  115 bpm  120 bpm  -  127 bpm  107 bpm   Heart Rate (Exit)  80 bpm  87 bpm  -  90 bpm  91 bpm   Oxygen Saturation (Admit)  96 %  -  -  -  -   Oxygen Saturation (Exercise)  97 %  -  -  -  -   Rating of Perceived Exertion (Exercise)  7  13  -  12  12   Symptoms  none  none  -  none  none   Comments  walk test results  first full day of exercise  -  -  -   Duration  -  Continue with 45 min of aerobic exercise without signs/symptoms of physical distress.  -  Continue with 45 min of aerobic exercise without signs/symptoms of physical distress.  Continue with 45 min of aerobic exercise without signs/symptoms of physical distress.   Intensity  -  THRR unchanged  -  THRR unchanged  THRR unchanged     Progression   Progression  -  Continue to progress workloads to maintain intensity without signs/symptoms of physical distress.  -  Continue to progress workloads to maintain intensity without signs/symptoms of physical distress.  Continue to progress workloads to maintain intensity without signs/symptoms of physical distress.   Average METs  -  2.67  -  2.38  2.43     Resistance Training   Training Prescription  -  Yes  -  Yes  Yes   Weight  -  5 lbs  -  5 lbs  5 lbs   Reps  -  10-15  -  10-15  10-15     Interval Training   Interval Training  -  No  -  No  No     Recumbant Bike   Level  -  2  -  2  2   Watts  -  20  -  16  16   Minutes  -  15  -  15  15   METs  -  2.63  -  2.47  2.47     NuStep   Level  -  2  -  2  -   Minutes  -  15  -  15  -   METs  -  2.7  -  2.3  -     Track   Laps  -  37  -  30  30   Minutes  -  15  -   15  15   METs  -  2.7  -  2.38  2.38     Home Exercise Plan   Plans to continue exercise at  -  -  Home (comment) walk or at the mall  Home (comment) walk or at the mall  Home (comment) walk or at the mall   Frequency  -  -  Add 1 additional day to program exercise sessions.  Add 1 additional day to program exercise sessions.  Add 1 additional day to program exercise sessions.   Initial Home Exercises Provided  -  -  06/07/17  06/07/17  06/07/17   Row Name 07/10/17 1600 07/23/17 1600 08/21/17 1500         Response to Exercise   Blood Pressure (Admit)  122/64  126/70  122/66     Blood Pressure (Exercise)  172/78  130/74  130/72     Blood Pressure (Exit)  132/64  112/60  112/60     Heart Rate (Admit)  83 bpm  88 bpm  65 bpm     Heart Rate (Exercise)  123 bpm  110 bpm  97 bpm     Heart Rate (Exit)  98 bpm  103 bpm  71 bpm     Rating of Perceived Exertion (Exercise)  _0 Symptoms  none  none  none     Duration  Continue with 30 min of aerobic exercise without signs/symptoms of physical distress.  Continue with 30 min of aerobic exercise without signs/symptoms of physical distress.  Continue with 30 min of aerobic exercise without signs/symptoms of physical distress.     Intensity  THRR unchanged  THRR  unchanged  THRR unchanged       Progression   Progression  Continue to progress workloads to maintain intensity without signs/symptoms of physical distress.  Continue to progress workloads to maintain intensity without signs/symptoms of physical distress.  Continue to progress workloads to maintain intensity without signs/symptoms of physical distress.     Average METs  2.62  2.72  2.19       Resistance Training   Training Prescription  Yes  Yes  Yes     Weight  5 lbs  5 lbs  5 lbs     Reps  10-15  10-15  10-15       Interval Training   Interval Training  No  No  No       Recumbant Bike   Level  2  2  -     Watts  16  26  -     Minutes  15  15  -     METs  2.47  2.79  -        NuStep   Level  _0 Minutes  _1 METs  _2 Track   Laps  _3 Minutes  _4 METs  2.38  2.38  2.38       Home Exercise Plan   Plans to continue exercise at  Home (comment) walk or at the mall  Home (comment) walk or at the mall  Home (comment) walk or at the mall     Frequency  Add 1 additional day to program exercise sessions.  Add 1 additional day to program exercise sessions.  Add 1 additional day to program exercise sessions.     Initial Home Exercises Provided  06/07/17  06/07/17  06/07/17        Exercise Comments: Exercise Comments    Row Name 05/23/17 0754 06/07/17 0914         Exercise Comments  First full day of exercise!  Patient was oriented to gym and equipment including functions, settings, policies, and procedures.  Patient's individual exercise prescription and treatment plan were reviewed.  All starting workloads were established based on the results of the 6 minute walk test done at initial orientation visit.  The plan for exercise progression was also introduced and progression will be customized based on patient's performance and goals  Reviewed home exercise with pt today.  Pt plans to walk for exercise.  Reviewed THR, pulse, RPE, sign and symptoms, NTG use, and when to call 911 or MD.  Also discussed weather considerations and indoor options.  Pt voiced understanding.         Exercise Goals and Review: Exercise Goals    Row Name 05/21/17 1224             Exercise Goals   Increase Physical Activity  Yes       Intervention  Provide advice, education, support and counseling about physical activity/exercise needs.;Develop an individualized exercise prescription for aerobic and resistive training based on initial evaluation findings, risk stratification, comorbidities and participant's personal goals.       Expected Outcomes  Achievement of increased cardiorespiratory fitness and enhanced flexibility,  muscular endurance and strength shown through measurements of functional capacity and personal statement of participant.  Increase Strength and Stamina  Yes       Intervention  Provide advice, education, support and counseling about physical activity/exercise needs.;Develop an individualized exercise prescription for aerobic and resistive training based on initial evaluation findings, risk stratification, comorbidities and participant's personal goals.       Expected Outcomes  Achievement of increased cardiorespiratory fitness and enhanced flexibility, muscular endurance and strength shown through measurements of functional capacity and personal statement of participant.       Able to understand and use rate of perceived exertion (RPE) scale  Yes       Intervention  Provide education and explanation on how to use RPE scale       Expected Outcomes  Short Term: Able to use RPE daily in rehab to express subjective intensity level;Long Term:  Able to use RPE to guide intensity level when exercising independently       Knowledge and understanding of Target Heart Rate Range (THRR)  Yes       Intervention  Provide education and explanation of THRR including how the numbers were predicted and where they are located for reference       Expected Outcomes  Short Term: Able to state/look up THRR;Long Term: Able to use THRR to govern intensity when exercising independently;Short Term: Able to use daily as guideline for intensity in rehab       Able to check pulse independently  Yes       Intervention  Provide education and demonstration on how to check pulse in carotid and radial arteries.;Review the importance of being able to check your own pulse for safety during independent exercise       Expected Outcomes  Short Term: Able to explain why pulse checking is important during independent exercise;Long Term: Able to check pulse independently and accurately       Understanding of Exercise Prescription  Yes        Intervention  Provide education, explanation, and written materials on patient's individual exercise prescription       Expected Outcomes  Short Term: Able to explain program exercise prescription;Long Term: Able to explain home exercise prescription to exercise independently          Exercise Goals Re-Evaluation : Exercise Goals Re-Evaluation    Row Name 05/23/17 0755 05/29/17 1353 06/07/17 0914 06/28/17 1341 07/10/17 1640     Exercise Goal Re-Evaluation   Exercise Goals Review  Understanding of Exercise Prescription;Knowledge and understanding of Target Heart Rate Range (THRR);Able to understand and use rate of perceived exertion (RPE) scale;Able to check pulse independently  Understanding of Exercise Prescription;Increase Physical Activity;Increase Strength and Stamina  Increase Physical Activity;Able to check pulse independently;Able to understand and use rate of perceived exertion (RPE) scale;Increase Strength and Stamina;Knowledge and understanding of Target Heart Rate Range (THRR);Understanding of Exercise Prescription  Increase Physical Activity;Increase Strength and Stamina;Understanding of Exercise Prescription  Increase Physical Activity;Increase Strength and Stamina;Understanding of Exercise Prescription   Comments  Reviewed RPE scale, THR and program prescription with pt today.  Pt voiced understanding and was given a copy of goals to take home.   Yarden completed his first full day of exercise.  He did well.  We will continue to monitor his progression.  Reviewed home exercise with pt today.  Pt plans to walk for exercise.  Reviewed THR, pulse, RPE, sign and symptoms, NTG use, and when to call 911 or MD.  Also discussed weather considerations and indoor options.  Pt voiced understanding.  Marcelino was cleared  to return to rehab after his follow up office visit.  He returned today and was able to complete his workloads without a problem.  He was disappointed in himself that he could not  exercise for the last week due to a UTI.  He is ready to get back into his home exercise as he has missed his walking regimine.  We will continue to monitor his progression.  Coda continues to do well in rehab.  He has been getting up to 30 laps now regularly again!  So far he has not had chest pain during exercise since returning.  We will continue to monitor his progresion.    Expected Outcomes  Short: Use RPE daily to regulate intensity.  Long: Follow program prescription in THR.  Short: Continue to attend Cardiac Rehab.  Long: Follow program prescription.   Short - Pt will add one day per week outside program sessions Long -Pt will exercise independently  Short: Continue to attend rehab regularly.  Long: Continue to exercise regularly.   Short: Try to increase workloads again.  Long: Continue to work on Animator.    [place] Name 07/23/17 1649 08/07/17 1510 08/21/17 1520 09/04/17 1425       Exercise Goal Re-Evaluation   Exercise Goals Review  Increase Physical Activity;Increase Strength and Stamina;Understanding of Exercise Prescription  -  Increase Physical Activity;Increase Strength and Stamina;Understanding of Exercise Prescription  -    Comments  Vamsi had been doing well in rehab.  He had not been feeling well off and on.  He is now up to 26 watts on the recumbent  bike.  Once he is cleared to return, we will continue to monitor his progression.   Out since last review  Broghan returned today and was feeling better than he has for the last several weeks.  He was excited to be back and hopes to get back to his regular exercise routine.  We will continue to moniotr his progress.   Out since last review    Expected Outcomes  Short: Cleared to return to rehab.  Long: Continue to build strength and stamina.   -  Short: Continue to attend rehab regularly again.  Long: Continue to work on buidling strength and stamina.   -       Discharge Exercise Prescription (Final Exercise  Prescription Changes): Exercise Prescription Changes - 08/21/17 1500      Response to Exercise   Blood Pressure (Admit)  122/66    Blood Pressure (Exercise)  130/72    Blood Pressure (Exit)  112/60    Heart Rate (Admit)  65 bpm    Heart Rate (Exercise)  97 bpm    Heart Rate (Exit)  71 bpm    Rating of Perceived Exertion (Exercise)  11    Symptoms  none    Duration  Continue with 30 min of aerobic exercise without signs/symptoms of physical distress.    Intensity  THRR unchanged      Progression   Progression  Continue to progress workloads to maintain intensity without signs/symptoms of physical distress.    Average METs  2.19      Resistance Training   Training Prescription  Yes    Weight  5 lbs    Reps  10-15      Interval Training   Interval Training  No      NuStep   Level  2    Minutes  15    METs  3  Track   Laps  30    Minutes  15    METs  2.38      Home Exercise Plan   Plans to continue exercise at  Home (comment) walk or at the mall    Frequency  Add 1 additional day to program exercise sessions.    Initial Home Exercises Provided  06/07/17       Nutrition:  Target Goals: Understanding of nutrition guidelines, daily intake of sodium <159m, cholesterol <2042m calories 30% from fat and 7% or less from saturated fats, daily to have 5 or more servings of fruits and vegetables.  Biometrics: Pre Biometrics - 05/21/17 1225      Pre Biometrics   Height  5' 9.25" (1.759 m)    Weight  223 lb 6.4 oz (101.3 kg)    Waist Circumference  43.5 inches    Hip Circumference  41 inches    Waist to Hip Ratio  1.06 %    BMI (Calculated)  32.75    Single Leg Stand  1.22 seconds        Nutrition Therapy Plan and Nutrition Goals: Nutrition Therapy & Goals - 07/05/17 1046      Nutrition Therapy   Diet  TLC    Drug/Food Interactions  Statins/Certain Fruits    Protein (specify units)  12oz    Fiber  30 grams    Saturated Fats  16 max. grams    Sodium  1500  grams      Personal Nutrition Goals   Nutrition Goal  Continue to increase daily walking milage    Personal Goal #2  Try consuming 4oz of fruit juice or a couple pieces of hard candy such as life savers when blood sugar readings are low, wait [id number] test blood glucose again    Personal Goal #3  Choose white meats when eating out more often to help reduce total sodium and fat in the meal    Comments  patient is a diabetic but does not follow a strict diabetic diet. tries to eat more vegetables than starches. does not eat consistently throughout the day      Intervention Plan   Intervention  Prescribe, educate and counsel regarding individualized specific dietary modifications aiming towards targeted core components such as weight, hypertension, lipid management, diabetes, heart failure and other comorbidities.    Expected Outcomes  Short Term Goal: Understand basic principles of dietary content, such as calories, fat, sodium, cholesterol and nutrients.;Short Term Goal: A plan has been developed with personal nutrition goals set during dietitian appointment.;Long Term Goal: Adherence to prescribed nutrition plan.       Nutrition Assessments: Nutrition Assessments - 05/21/17 1221      MEDFICTS Scores   Pre Score  24       Nutrition Goals Re-Evaluation: Nutrition Goals Re-Evaluation    Row Name 06/28/17 1418             Goals   Current Weight  220 lb (99.8 kg)       Nutrition Goal  Meet with dietician and talk about appetite       Comment  Scheduled an appt with dietician during class on Jan 3 to talk about improving his appetite.       Expected Outcome  Short: Meet with dietician  Long: Follow recommendations set bu dietician          Nutrition Goals Discharge (Final Nutrition Goals Re-Evaluation): Nutrition Goals Re-Evaluation - 06/28/17 1418  Goals   Current Weight  220 lb (99.8 kg)    Nutrition Goal  Meet with dietician and talk about appetite    Comment  Scheduled  an appt with dietician during class on Jan 3 to talk about improving his appetite.    Expected Outcome  Short: Meet with dietician  Long: Follow recommendations set bu dietician       Psychosocial: Target Goals: Acknowledge presence or absence of significant depression and/or stress, maximize coping skills, provide positive support system. Participant is able to verbalize types and ability to use techniques and skills needed for reducing stress and depression.   Initial Review & Psychosocial Screening: Initial Psych Review & Screening - 05/21/17 1224      Initial Review   Current issues with  Current Stress Concerns    Source of Stress Concerns  Chronic Illness;Transportation    Comments  Ygnacio moved from [place] recently to be closer to family. He has recently moved in with his sister and brother in law who have a 72 year old autistic son in a group home.  Obrien says he misses his 1 year old Grandchildren that he took care of every other weekend for his son and daughter in law. Tino admits that was his reason to quit drinking 13 years ago. He said he had his first heart attack 13 years ago about a month after he quit drinking a pint of liquor on Friday and a 5th or 2 on Saturday night. Brandley recently retired from working at [employer] for [employer] years and said he has been in and out of the hospital every since. Zadok reports that he has never smoked. Maurico said he used to go to a large mall outside of [place] and sometimes walk 10 miles but is frustrated since he has still had heart problems including his recenlty NSTEMI/PTCA.  Jevin said he doesn't eat like he should and isn't hungry much. He reports his blood sugars in the am are sometimes 250 but sometimes 120. Discussed maybe eating something since he takes long acting Metformin. The short acting Metformin is listed as an allergy since it causes diarrhea so much. Edman carries his NTG sl and knows to discard it  once it is opened per his MD he said he discards it after 6 months. He has a new bottle ready to start next week. Linas given infor about [employer] and bus line in case his family can't drive him. Taavi said he really doesn't want to drive at times since he has vertigo at times      Bryson?  Yes      Barriers   Psychosocial barriers to participate in program  The patient should benefit from training in stress management and relaxation.      Screening Interventions   Interventions  Yes;Encouraged to exercise;To provide support and resources with identified psychosocial needs    Expected Outcomes  Short Term goal: Utilizing psychosocial counselor, staff and physician to assist with identification of specific Stressors or current issues interfering with healing process. Setting desired goal for each stressor or current issue identified.;Long Term Goal: Stressors or current issues are controlled or eliminated.;Short Term goal: Identification and review with participant of any Quality of Life or Depression concerns found by scoring the questionnaire.;Long Term goal: The participant improves quality of Life and PHQ9 Scores as seen by post scores and/or verbalization of changes       Quality of Life Scores:  Quality of Life - 05/21/17 1225      Quality of Life Scores   Health/Function Pre  18.43 %    Socioeconomic Pre  25.64 %    Psych/Spiritual Pre  24 %    Family Pre  27 %    GLOBAL Pre  22.18 %      Scores of 19 and below usually indicate a poorer quality of life in these areas.  A difference of  2-3 points is a clinically meaningful difference.  A difference of 2-3 points in the total score of the Quality of Life Index has been associated with significant improvement in overall quality of life, self-image, physical symptoms, and general health in studies assessing change in quality of life.  PHQ-9: Recent Review Flowsheet Data    Depression screen [facility] 2/9  07/04/2017 05/21/2017 05/11/2017 04/30/2017   Decreased Interest 0 0 0 0   Down, Depressed, Hopeless 0 0 0 0   PHQ - 2 Score 0 0 0 0   Altered sleeping - 1 - -   Tired, decreased energy - 1 - -   Change in appetite - 1 - -   Feeling bad or failure about yourself  - 0 - -   Trouble concentrating - 0 - -   Moving slowly or fidgety/restless - 2 - -   Suicidal thoughts - 0 - -   PHQ-9 Score - 5 - -   Difficult doing work/chores - Somewhat difficult - -     Interpretation of Total Score  Total Score Depression Severity:  1-4 = Minimal depression, 5-9 = Mild depression, 10-14 = Moderate depression, 15-19 = Moderately severe depression, 20-27 = Severe depression   Psychosocial Evaluation and Intervention: Psychosocial Evaluation - 05/23/17 0940      Psychosocial Evaluation & Interventions   Interventions  Encouraged to exercise with the program and follow exercise prescription;Relaxation education;Stress management education    Comments  Counselor met with Mr. Spradley Glick) today for initial psychosocial evaluation.  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  He reports having been in the hospital 5x since August with chest pain, etc.  He reports the last stent two weeks ago has made the most change in his increased energy levels and mood.  Yakir has other health issues with diabetes, colitis, diverticulitis, vertigo and high blood pressure.  He reports sleeping well - sometimes "too much."  He finally got his appetite back recently and he was pleased about that the day before Thanksgiving!  Giavonni denies a history of depression or anxiety or any current symptoms and he reports his mood is generally positive most of the time.  Yong has multiple stressors with moving from [place] to [place] this past August.  His health is also an issue.  But he mostly is missing his twin 85 year old grandchildren in [place]  currently.  He has goals to lose weight; be able to walk up to 10 miles a day - as he reports doing often prior to his cardiact issues; and he would like to feel more energy and less tired.  Staff will follow with Juanda Crumble throughout the course of this program.      Expected Outcomes  Gabriela will benefit from consistent exercise to achieve his stated goals.  He will be meeting with the dietician to address his weight loss goals.  The educational and psychoeducational components  of this program will be helpful in [place] learning more about his condition and positive ways to cope.  Staff will follow.    Continue Psychosocial Services   Follow up required by staff       Psychosocial Re-Evaluation: Psychosocial Re-Evaluation    [place] Name 06/07/17 1252 06/28/17 1422           Psychosocial Re-Evaluation   Current issues with  Current Stress Concerns  Current Stress Concerns      Comments  Unable to afford Ranexa since $700/month so Bee stopped that and increased his Imdur 26m. CRjis going to verify with Dr. AFletcher Anonif he is suppose to ahv 927mImdur.   Ander's health has continued to be his biggest stressor.  He feels that he does not have a lot of other stress and tries not to let anything get to him too much.  He has also been disappointed that he has not been able to exercise as much as he would like to do.  His sleep continues to be a struggle because he is constantly getting up to go to the bathroom throughout the night.  He tries to make the best of it.  He has found the exercise in the program to be very helpful and is helping get healthier as well.       Expected Outcomes  -  Short: Continue to attend rehab regularly. Long: Continue to work to improving health.      Interventions  -  Encouraged to attend Cardiac Rehabilitation for the exercise;Stress management education      Continue Psychosocial Services   -  Follow up required by staff        Initial Review   Source of  Stress Concerns  -  Unable to perform yard/household activities;Chronic Illness;Unable to participate in former interests or hobbies;Transportation         Psychosocial Discharge (Final Psychosocial Re-Evaluation): Psychosocial Re-Evaluation - 06/28/17 1422      Psychosocial Re-Evaluation   Current issues with  Current Stress Concerns    Comments  Leondre's health has continued to be his biggest stressor.  He feels that he does not have a lot of other stress and tries not to let anything get to him too much.  He has also been disappointed that he has not been able to exercise as much as he would like to do.  His sleep continues to be a struggle because he is constantly getting up to go to the bathroom throughout the night.  He tries to make the best of it.  He has found the exercise in the program to be very helpful and is helping get healthier as well.     Expected Outcomes  Short: Continue to attend rehab regularly. Long: Continue to work to improving health.    Interventions  Encouraged to attend Cardiac Rehabilitation for the exercise;Stress management education    Continue Psychosocial Services   Follow up required by staff      Initial Review   Source of Stress Concerns  Unable to perform yard/household activities;Chronic Illness;Unable to participate in former interests or hobbies;Transportation       Vocational Rehabilitation: Provide vocational rehab assistance to qualifying candidates.   Vocational Rehab Evaluation & Intervention: Vocational Rehab - 05/21/17 1206      Initial Vocational Rehab Evaluation & Intervention   Assessment shows need for Vocational Rehabilitation  No       Education: Education Goals: Education classes will be  provided on a variety of topics geared toward better understanding of heart health and risk factor modification. Participant will state understanding/return demonstration of topics presented as noted by education test scores.  Learning  Barriers/Preferences: Learning Barriers/Preferences - 05/21/17 1204      Learning Barriers/Preferences   Learning Barriers  Hearing    Learning Preferences  Group Instruction       Education Topics:  AED/CPR: - Group verbal and written instruction with the use of models to demonstrate the basic use of the AED with the basic ABC's of resuscitation.   General Nutrition Guidelines/Fats and Fiber: -Group instruction provided by verbal, written material, models and posters to present the general guidelines for heart healthy nutrition. Gives an explanation and review of dietary fats and fiber.   Controlling Sodium/Reading Food Labels: -Group verbal and written material supporting the discussion of sodium use in heart healthy nutrition. Review and explanation with models, verbal and written materials for utilization of the food label.   Exercise Physiology & General Exercise Guidelines: - Group verbal and written instruction with models to review the exercise physiology of the cardiovascular system and associated critical values. Provides general exercise guidelines with specific guidelines to those with heart or lung disease.    Cardiac Rehab from 08/21/2017 in [facility] Cardiac and Pulmonary Rehab  Date  06/28/17  Educator  [facility]  Instruction Review Code  1- Verbalizes Understanding      Aerobic Exercise & Resistance Training: - Gives group verbal and written instruction on the various components of exercise. Focuses on aerobic and resistive training programs and the benefits of this training and how to safely progress through these programs..   Cardiac Rehab from 08/21/2017 in [facility] Cardiac and Pulmonary Rehab  Date  07/05/17  Educator  [facility]  Instruction Review Code  1- Verbalizes Understanding      Flexibility, Balance, Mind/Body Relaxation: Provides group verbal/written instruction on the benefits of flexibility and balance training, including mind/body exercise modes such as yoga, pilates  and tai chi.  Demonstration and skill practice provided.   Cardiac Rehab from 08/21/2017 in [facility] Cardiac and Pulmonary Rehab  Date  07/10/17  Educator  [facility]  Instruction Review Code  1- Verbalizes Understanding      Stress and Anxiety: - Provides group verbal and written instruction about the health risks of elevated stress and causes of high stress.  Discuss the correlation between heart/lung disease and anxiety and treatment options. Review healthy ways to manage with stress and anxiety.   Depression: - Provides group verbal and written instruction on the correlation between heart/lung disease and depressed mood, treatment options, and the stigmas associated with seeking treatment.   Cardiac Rehab from 08/21/2017 in [facility] Cardiac and Pulmonary Rehab  Date  08/21/17  Educator  [facility]  Instruction Review Code  1- Verbalizes Understanding      Anatomy & Physiology of the Heart: - Group verbal and written instruction and models provide basic cardiac anatomy and physiology, with the coronary electrical and arterial systems. Review of Valvular disease and Heart Failure   Cardiac Procedures: - Group verbal and written instruction to review commonly prescribed medications for heart disease. Reviews the medication, class of the drug, and side effects. Includes the steps to properly store meds and maintain the prescription regimen. (beta blockers and nitrates)   Cardiac Medications I: - Group verbal and written instruction to review commonly prescribed medications for heart disease. Reviews the medication, class of the drug, and side effects. Includes the steps to properly  store meds and maintain the prescription regimen.   Cardiac Rehab from 08/21/2017 in [facility] Cardiac and Pulmonary Rehab  Date  07/17/17  Educator  [facility]  Instruction Review Code  1- Verbalizes Understanding      Cardiac Medications II: -Group verbal and written instruction to review commonly prescribed medications for heart  disease. Reviews the medication, class of the drug, and side effects. (all other drug classes)   Cardiac Rehab from 08/21/2017 in [facility] Cardiac and Pulmonary Rehab  Date  07/12/17  Educator  [facility]  Instruction Review Code  1- Verbalizes Understanding       Go Sex-Intimacy & Heart Disease, Get SMART - Goal Setting: - Group verbal and written instruction through game format to discuss heart disease and the return to sexual intimacy. Provides group verbal and written material to discuss and apply goal setting through the application of the S.M.A.R.T. Method.   Other Matters of the Heart: - Provides group verbal, written materials and models to describe Stable Angina and Peripheral Artery. Includes description of the disease process and treatment options available to the cardiac patient.   Exercise & Equipment Safety: - Individual verbal instruction and demonstration of equipment use and safety with use of the equipment.   Cardiac Rehab from 08/21/2017 in [facility] Cardiac and Pulmonary Rehab  Date  05/21/17  Educator  C.EnterkinRN  Instruction Review Code  3- Needs Reinforcement      Infection Prevention: - Provides verbal and written material to individual with discussion of infection control including proper hand washing and proper equipment cleaning during exercise session.   Cardiac Rehab from 08/21/2017 in [facility] Cardiac and Pulmonary Rehab  Date  05/21/17  Educator  Loletha Grayer ENterkinRN  Instruction Review Code  1- Verbalizes Understanding      Falls Prevention: - Provides verbal and written material to individual with discussion of falls prevention and safety.   Cardiac Rehab from 08/21/2017 in [facility] Cardiac and Pulmonary Rehab  Date  05/21/17  Educator  C. ENterkinRN  Instruction Review Code  1- Verbalizes Understanding      Diabetes: - Individual verbal and written instruction to review signs/symptoms of diabetes, desired ranges of glucose level fasting, after meals and with exercise.  Acknowledge that pre and post exercise glucose checks will be done for 3 sessions at entry of program.   Cardiac Rehab from 08/21/2017 in [facility] Cardiac and Pulmonary Rehab  Date  05/21/17  Educator  C. [place]  Instruction Review Code  3- Needs Reinforcement      Know Your Numbers and Risk Factors: -Group verbal and written instruction about important numbers in your health.  Discussion of what are risk factors and how they play a role in the disease process.  Review of Cholesterol, Blood Pressure, Diabetes, and BMI and the role they play in your overall health.   Cardiac Rehab from 08/21/2017 in [facility] Cardiac and Pulmonary Rehab  Date  07/12/17  Educator  [facility]  Instruction Review Code  1- Verbalizes Understanding      Sleep Hygiene: -Provides group verbal and written instruction about how sleep can affect your health.  Define sleep hygiene, discuss sleep cycles and impact of sleep habits. Review good sleep hygiene tips.    Other: -Provides group and verbal instruction on various topics (see comments)   Knowledge Questionnaire Score: Knowledge Questionnaire Score - 05/23/17 0821      Knowledge Questionnaire Score   Pre Score  14/28 Reviewed with patient       Core Components/Risk Factors/Patient Goals  at Admission: Personal Goals and Risk Factors at Admission - 05/21/17 1224      Core Components/Risk Factors/Patient Goals on Admission    Weight Management  Yes;Obesity;Weight Loss    Intervention  Weight Management: Develop a combined nutrition and exercise program designed to reach desired caloric intake, while maintaining appropriate intake of nutrient and fiber, sodium and fats, and appropriate energy expenditure required for the weight goal.;Weight Management: Provide education and appropriate resources to help participant work on and attain dietary goals.;Weight Management/Obesity: Establish reasonable short term and long term weight goals.    Admit Weight  223 lb 6.4 oz  (101.3 kg)    Goal Weight: Short Term  218 lb (98.9 kg)    Goal Weight: Long Term  213 lb (96.6 kg)    Expected Outcomes  Short Term: Continue to assess and modify interventions until short term weight is achieved;Long Term: Adherence to nutrition and physical activity/exercise program aimed toward attainment of established weight goal;Weight Loss: Understanding of general recommendations for a balanced deficit meal plan, which promotes 1-2 lb weight loss per week and includes a negative energy balance of [phone] kcal/d;Understanding recommendations for meals to include 15-35% energy as protein, 25-35% energy from fat, 35-60% energy from carbohydrates, less than 248m of dietary cholesterol, 20-35 gm of total fiber daily;Understanding of distribution of calorie intake throughout the day with the consumption of 4-5 meals/snacks    Diabetes  Yes    Intervention  Provide education about signs/symptoms and action to take for hypo/hyperglycemia.;Provide education about proper nutrition, including hydration, and aerobic/resistive exercise prescription along with prescribed medications to achieve blood glucose in normal ranges: Fasting glucose 65-99 mg/dL    Expected Outcomes  Short Term: Participant verbalizes understanding of the signs/symptoms and immediate care of hyper/hypoglycemia, proper foot care and importance of medication, aerobic/resistive exercise and nutrition plan for blood glucose control.;Long Term: Attainment of HbA1C < 7%.    Heart Failure  Yes    Intervention  Provide a combined exercise and nutrition program that is supplemented with education, support and counseling about heart failure. Directed toward relieving symptoms such as shortness of breath, decreased exercise tolerance, and extremity edema.    Expected Outcomes  Improve functional capacity of life;Short term: Attendance in program 2-3 days a week with increased exercise capacity. Reported lower sodium intake. Reported increased  fruit and vegetable intake. Reports medication compliance.;Short term: Daily weights obtained and reported for increase. Utilizing diuretic protocols set by physician.;Long term: Adoption of self-care skills and reduction of barriers for early signs and symptoms recognition and intervention leading to self-care maintenance.    Hypertension  Yes    Intervention  Provide education on lifestyle modifcations including regular physical activity/exercise, weight management, moderate sodium restriction and increased consumption of fresh fruit, vegetables, and low fat dairy, alcohol moderation, and smoking cessation.;Monitor prescription use compliance.    Expected Outcomes  Short Term: Continued assessment and intervention until BP is < 140/938mHG in hypertensive participants. < 130/8057mG in hypertensive participants with diabetes, heart failure or chronic kidney disease.;Long Term: Maintenance of blood pressure at goal levels.    Lipids  Yes    Intervention  Provide education and support for participant on nutrition & aerobic/resistive exercise along with prescribed medications to achieve LDL <48m78mDL >40mg68m Expected Outcomes  Short Term: Participant states understanding of desired cholesterol values and is compliant with medications prescribed. Participant is following exercise prescription and nutrition guidelines.;Long Term: Cholesterol controlled with medications as prescribed, with individualized  exercise RX and with personalized nutrition plan. Value goals: LDL < 61m, HDL > 40 mg.    Stress  Yes    Intervention  Offer individual and/or small group education and counseling on adjustment to heart disease, stress management and health-related lifestyle change. Teach and support self-help strategies.;Refer participants experiencing significant psychosocial distress to appropriate mental health specialists for further evaluation and treatment. When possible, include family members and significant others  in education/counseling sessions.    Expected Outcomes  Short Term: Participant demonstrates changes in health-related behavior, relaxation and other stress management skills, ability to obtain effective social support, and compliance with psychotropic medications if prescribed.;Long Term: Emotional wellbeing is indicated by absence of clinically significant psychosocial distress or social isolation.       Core Components/Risk Factors/Patient Goals Review:  Goals and Risk Factor Review    Row Name 06/28/17 1348             Core Components/Risk Factors/Patient Goals Review   Personal Goals Review  Weight Management/Obesity;Heart Failure;Hypertension;Diabetes;Lipids       Review  CHatcherhas been doing well in rehab.  His weight has been holding steady around 220 lbs.  He has not had any heart failure sysmtoms and continues to monitor his salt and fluid intake daily.  His blood pressure has been good, but he continues get lightheaded but not sure if could be his vertigo or something else.  He also continues to monitor his blood sugars daily.  He recently had a low of 89 fasting, which is the best it has been in a long time.         Expected Outcomes  Short: Continue to track weight and heart failure symptoms.  Long: Continue to work on risk factor modifications.           Core Components/Risk Factors/Patient Goals at Discharge (Final Review):  Goals and Risk Factor Review - 06/28/17 1348      Core Components/Risk Factors/Patient Goals Review   Personal Goals Review  Weight Management/Obesity;Heart Failure;Hypertension;Diabetes;Lipids    Review  CDavidjameshas been doing well in rehab.  His weight has been holding steady around 220 lbs.  He has not had any heart failure sysmtoms and continues to monitor his salt and fluid intake daily.  His blood pressure has been good, but he continues get lightheaded but not sure if could be his vertigo or something else.  He also continues to monitor his blood  sugars daily.  He recently had a low of 89 fasting, which is the best it has been in a long time.      Expected Outcomes  Short: Continue to track weight and heart failure symptoms.  Long: Continue to work on risk factor modifications.        ITP Comments: ITP Comments    Row Name 05/21/17 1212 06/05/17 1040 06/06/17 0600 06/07/17 1249 06/14/17 0931   ITP Comments  [place]. He has recently moved in with his sister and brother in law who have a 212year old autistic son in a group home.  CCastensays he misses his 149year old Grandchildren that he took care of every other weekend for his son and daughter in law. CJavinadmits that was his reason to quit drinking 13 years ago. He said he had his first heart attack 13 years ago about a month after he quit drinking a pint of liquor on Friday and a 5th or  2 on Saturday night. Gussie recently retired from working at [employer] for [employer] years and said he has been in and out of the hospital every since. Jakobe reports that he has never smoked. Avary said he used to go to a large mall outside of [place] and sometimes walk 10 miles but is frustrated since he has still had heart problems including his recenlty NSTEMI/PTCA.  Shourya said he doesn't eat like he should and isn't hungry much. He reports his blood sugars in the am are sometimes 250 but sometimes 120. Discussed maybe eating something since he takes long acting Metformin. The short acting Metformin is listed as an allergy since it causes diarrhea so much. Jahron carries his NTG sl and knows to discard it once it is opened per his MD he said he discards it after 6 months. He has a new bottle ready to start next week. Zuri given infor about [employer] and bus line in case his family can't drive him. Daemien said he really doesn't want to drive at times since he has vertigo at times.   Nazario recieved clearance to return to rehab this  morning.  He was able to exercise without any problems.   30 day review. Continue with ITP unless directed changes per Medical Director review.   Per ER report yesterday :Bary Limbach Cummings. is a 67 y.o. male with a history of colitis, diverticulitis as well as diabetes and coronary artery disease on Plavix who is presenting to the emergency department today with one episode of what he describes as "coffee ground stool."  He says that he has had 7 episodes of diarrhea today.  No recent antibiotics but multiple recent hospitalizations over the past several weeks.  Says that he has some diffuse abdominal cramping which is intermittent.  No pain at this time.  No nausea or vomiting.  Just discharged recently for congestive heart failure.  Says that he was discharged on Ranexa and has been experiencing some intermittent episodes of chest pain that are relieved by nitroglycerin.  However, he has been unable to afford the Ranexa and so has not been taking it.  He does take Imdur, 30 mg daily.  From Emerg Dept note today: The patient was at cardiac rehab, when he had a sharp chest pain that went from the lateral axillary line around towards the midline on the left side.  He then developed a lightheaded sensation with severe nausea but no vomiting.  He had associated cold and clammy feeling.  No palpitations, no syncope.  The patient states that his chest pain has now turned into a "dull ache."  He took one nitroglycerin without significant improvement.  He has not had any of his daily medications, including his aspirin.  The patient also reports that Friday, Saturday, and Sunday night, he was awoken from his sleep with chest pain and took 3 nitroglycerin overnight each night at approximately 2-hour intervals which improved his pain.  The patient was seen and evaluated here 12/5 for concern of "coffee-ground stool."  He was found to have guaiac negative testing and stable blood counts and was discharged home with a plan for  outpatient GI follow-up.  The patient has not had any further black or tarry, or bloody stools.   [place] Name 07/04/17 2542 07/12/17 1113 07/23/17 1648 07/31/17 0840 08/01/17 0611   ITP Comments  30 day review. Continue with ITP unless directed changes per Medical Director review.   Jaree had trouble with vertigo today and  could not exercise as much.  He said he had some chest pain that was anxiety.  He stayed for the length of session and got in some intermittent exercise.  He was not able to do resistance training today. He called his ride to also bring his medication with to pick him up.  After resting for a bit, he was able to walk out on his own to meet his ride.  Kenlee has been in the hospital for first degree heart block and NSTEMI.  He is home and feeling better.  He will need clearance in order to return to rehab.   Reyce has been granted clearance to return to rehab after his appointment today.  Dr. Fletcher Anon said that he did not have another heart attack and it was just indegistion.  He is cleared to return without restricitions.  30 Day review. Continue with ITP unless directed changes per Medical Director review.    [place] Name 08/07/17 1509 08/08/17 1407 08/21/17 0905 08/29/17 0618 08/30/17 1106   ITP Comments  Called to check on status of return.  Ibraheem was cleared to return by Dr. Fletcher Anon last week, but has not returned.  Upon chart review, he present to the ED after a fall at home with a chest contusion.  Unable to leave message at home or on mobile at this time.   Marquett called back.  He has the flu and soreness from his fall.  He is hoping to return on Tuesday.    Roy returned today after an extended absence.  He is feeling better.  30 day review. Continue with ITP unless directed changes per Medical Director review.   2 visits this month  Garvis stopped by to let us that he was cleared to return to rehab.  He will be wearing a monitor.    [place] Name 09/04/17 1424           ITP Comments   Called to check on pt status.  Unable to leave message as there is no voicemail box.  Other telephone encounters show that he has called to let offices know that he has moved to [place].  Sierra returned call to confirm his move.  His brother in law had kicked him out and he had no other place to go.  Discharge ITP created and sent to Dr. Sabra Heck for review.           Comments: Discharge ITP

## 2017-09-21 NOTE — Progress Notes (Deleted)
Patient ID: Christopher Elseharles Rayborn Sr., male    DOB: June 02, 1951, 67 y.o.   MRN: [id number]  HPI  Christopher Cummings is a 67 y/o male with a history of CAD, colitis, DM, diverticulitis, hyperlipidemia, HTN, remote alcohol use and chronic heart failure.   Echo report from 04/02/17 reviewed and showed an EF of 35-40% along with mild Christopher/TR. PCI with drug-eluting stent placed 05/08/17 in the proximal LCX.   Cardiac catheterization done 08/22/17 and showed CAD. Patent stents in LCX and RCA. Chronic total occlusion of diagonal stent.Catheterization done 06/15/17 showed significant underlying three-vessel CAD with patent stent in the RCA with mild in-stent restenosis. No significant change in coronary anatomy since prior catheterization. Cardiac catheterization done 04/02/17 showed multivessel disease with balloon intervention done.   Admitted 08/21/17 due to unstable angina. Cardiology consult was obtained along with cardiac catheterization done. Medications adjusted and he was discharged after 2 days. Was in the ED 08/06/17 due to chest wall pain after a mechanical fall. Was evaluated and released. Was in the ED 08/05/17 due to diarrhea where he was evaluated and released. Admitted 07/18/17 due to chest pain secondary to GI and musculoskeletal. Cardiology consult was obtained and he was discharged after 2 days. Was in the ED 06/25/17 due to generalized weakness. Diagnosed with a UTI and he was treated and released. Admitted 06/14/17 due to unstable angina. Cardiology consult was obtained and cardiac catheterization was done which stable cardiac anatomy. Discharged after 2 days. Was in the ED 06/06/17 due to diarrhea where he was treated and released. Admitted 05/31/17 due to unstable angina. Cardiology consult was obtained, medications were adjusted and he was discharged the next day.  Admitted 05/07/17 due to NSETMI. Cardiology consult obtained. Catheterization done per above. Discharged home after 2 days. Admitted 04/23/17 due to chest  pain. Cardiology consult obtained. Medications were adjusted and he was was discharged home after 3 days.   He presents today for a follow-up visit with a chief complaint of   Past Medical History:  Diagnosis Date  . Aortic stenosis    a. 07/2015 s/p bioprosthetic AVR (#23 [employer]) - [place]; b. 04/2017 Echo: nl fxn'ing AoV.  Marland Kitchen. CAD (coronary artery disease)    a. 2009 PCI->D1; b. 07/2015 CABG x 2 reported (LIMA-LAD noted on cath 01/2017) [facility] [place]; c. 2017/2018 Prox/Dist LCX & D1 stenting; d. 01/2017 PCI: ISR prox/dist LCX stents (PTCA), ISR D1 (med Rx); e. 04/2017 PCI: CBA for ISR- LCX 90p, 70d, D1 95; f. 05/2017 PCI: D1 99 ISR, LCX 70p (2.25x22 [employer] DES); g. 06/2017 Cath: Patent LCX and RCA stents. D1 100 ISR->Med Rx.  . Chronic systolic CHF (congestive heart failure) (HCC)    a. 04/2017 Echo: EF 35-40% with nl functioning bioprosthetic AoV, mild Christopher, nl PA.  . Colitis   . Diabetes mellitus with complication (HCC)   . Diverticulitis   . Essential hypertension   . Hyperlipidemia   . Ischemic cardiomyopathy    a. 04/2017 Echo: EF 35-40%.  . Permanent atrial fibrillation (HCC)    a. CHA2DS2VASc = 4-->No OAC 2/2 ongoing ASA/Plavix and h/o rectal bleeding.  . Rectal bleeding    a. 03/2017 -> f/u @ [facility].   Past Surgical History:  Procedure Laterality Date  . AORTIC VALVE REPLACEMENT    . CORONARY ARTERY BYPASS GRAFT    . CORONARY BALLOON ANGIOPLASTY N/A 04/02/2017   Procedure: CORONARY BALLOON ANGIOPLASTY;  Surgeon: Lennette BihariKelly, Thomas A, MD;  Location: [facility] INVASIVE CV LAB;  Service: Cardiovascular;  Laterality: N/A;  . CORONARY STENT INTERVENTION N/A 02/26/2017   Procedure: CORONARY STENT INTERVENTION;  Surgeon: Iran Ouch, MD;  Location: [facility] INVASIVE CV LAB;  Service: Cardiovascular;  Laterality: N/A;  . CORONARY STENT INTERVENTION N/A 05/08/2017   Procedure: CORONARY STENT INTERVENTION;  Surgeon: Yvonne Kendall, MD;  Location: [facility] INVASIVE CV LAB;  Service:  Cardiovascular;  Laterality: N/A;  . CORONARY/GRAFT ANGIOGRAPHY N/A 02/26/2017   Procedure: CORONARY/GRAFT ANGIOGRAPHY;  Surgeon: Iran Ouch, MD;  Location: [facility] INVASIVE CV LAB;  Service: Cardiovascular;  Laterality: N/A;  . CORONARY/GRAFT ANGIOGRAPHY N/A 04/02/2017   Procedure: CORONARY/GRAFT ANGIOGRAPHY;  Surgeon: Lennette Bihari, MD;  Location: [facility] INVASIVE CV LAB;  Service: Cardiovascular;  Laterality: N/A;  . CORONARY/GRAFT ANGIOGRAPHY N/A 05/08/2017   Procedure: CORONARY/GRAFT ANGIOGRAPHY;  Surgeon: Yvonne Kendall, MD;  Location: [facility] INVASIVE CV LAB;  Service: Cardiovascular;  Laterality: N/A;  . CORONARY/GRAFT ANGIOGRAPHY N/A 08/22/2017   Procedure: CORONARY/GRAFT ANGIOGRAPHY;  Surgeon: Yvonne Kendall, MD;  Location: [facility] INVASIVE CV LAB;  Service: Cardiovascular;  Laterality: N/A;  . JOINT REPLACEMENT Left    KNEE  . KNEE ARTHROPLASTY  2000  . LEFT HEART CATH AND CORONARY ANGIOGRAPHY N/A 06/15/2017   Procedure: LEFT HEART CATH AND CORONARY ANGIOGRAPHY;  Surgeon: Iran Ouch, MD;  Location: [facility] INVASIVE CV LAB;  Service: Cardiovascular;  Laterality: N/A;   Family History  Problem Relation Age of Onset  . CAD Mother   . Heart failure Mother   . Lupus Mother   . CAD Brother   . Heart attack Brother   . Prostate cancer Father   . Diabetes Sister   . Healthy Paternal Grandmother   . Prostate cancer Paternal Grandfather   . Healthy Brother   . Healthy Sister    Social History   Tobacco Use  . Smoking status: Never Smoker  . Smokeless tobacco: Never Used  Substance Use Topics  . Alcohol use: No    Comment: No Alcohol 12 years, but drank 2 "bottles of liquor" each week for several years prior to quitting   Allergies  Allergen Reactions  . Metformin And Related Other (See Comments)    Only the regular Metformin (diarrhea)    Review of Systems  Constitutional: Positive for fatigue. Negative for appetite change.  HENT: Positive for congestion. Negative for  postnasal drip and sore throat.   Eyes: Negative.   Respiratory: Negative for cough, chest tightness and shortness of breath.   Cardiovascular: Positive for palpitations (intermittent). Negative for chest pain and leg swelling.  Gastrointestinal: Negative for abdominal distention and abdominal pain.  Endocrine: Negative.   Genitourinary: Negative.   Musculoskeletal: Negative for back pain and neck pain.  Skin: Negative.   Allergic/Immunologic: Negative.   Neurological: Negative for dizziness and light-headedness.  Hematological: Negative for adenopathy. Bruises/bleeds easily.  Psychiatric/Behavioral: Positive for sleep disturbance (wakes up frequently due to nocturia). Negative for dysphoric mood. The patient is not nervous/anxious.     Physical Exam  Constitutional: He is oriented to person, place, and time. He appears well-developed and well-nourished.  HENT:  Head: Normocephalic and atraumatic.  Neck: Normal range of motion. Neck supple. No JVD present.  Cardiovascular: Normal rate and regular rhythm.  Pulmonary/Chest: Effort normal. He has no wheezes. He has no rales.  Abdominal: Soft. He exhibits no distension. There is no tenderness.  Musculoskeletal: He exhibits no edema or tenderness.  Neurological: He is alert and oriented to person, place, and time.  Skin: Skin is warm and dry.  Psychiatric: He has a normal mood and affect. His behavior is normal. Thought content normal.  Nursing note and vitals reviewed.  Assessment & Plan:  1: Chronic heart failure with reduced ejection fraction- - NYHA class III - euvolemic today - weighing daily and says that his weight has been stable. Instructed to call for an overnight weight gain of >2 pounds or a weekly weight gain of >5 pounds - not adding salt to his food. Reviewed the importance of keeping daily sodium intake to 2000mg  daily  - patient reports receiving his flu vaccine for this season already - saw cardiology ([place])  08/30/17 - BNP 04/29/17 was 158.0  2: HTN-  - has been taken off lisinopril due to hypotension; may not be able to tolerate entresto - BP looks good today - BMP from 08/30/17 reviewed and shows sodium 139, potassium 4.0 and GFR 91 - saw PCP Kyung Rudd) 06/29/17  3: CAD- -  - continues to have unstable angina resulting in numerous visits to the ED/admissions when he runs out of ranexa - runs out of ranexa frequently due to cost of medication; encouraged him to stop at cardiology office today to see if they have more samples - participating in cardiac rehab  4: Diabetes- - continues on metformin and jardiance - A1c on 05/07/17 was 9.3% - glucose at home yesterday was  -   Medication list was reviewed.

## 2017-09-25 ENCOUNTER — Ambulatory Visit: Payer: BLUE CROSS/BLUE SHIELD | Admitting: Family

## 2017-10-02 ENCOUNTER — Ambulatory Visit: Payer: BLUE CROSS/BLUE SHIELD | Admitting: Family

## 2017-10-11 ENCOUNTER — Ambulatory Visit: Payer: BLUE CROSS/BLUE SHIELD | Admitting: Nurse Practitioner

## 2017-11-08 ENCOUNTER — Ambulatory Visit: Payer: BLUE CROSS/BLUE SHIELD | Admitting: Cardiovascular Disease

## 2019-05-09 IMAGING — DX DG CHEST 1V PORT
1 series · 1 of 1 positions shown · non-contrast
Comparison: 02/24/2017.

CLINICAL DATA: Chest pain

EXAM:
PORTABLE CHEST 1 VIEW

[chest ap]
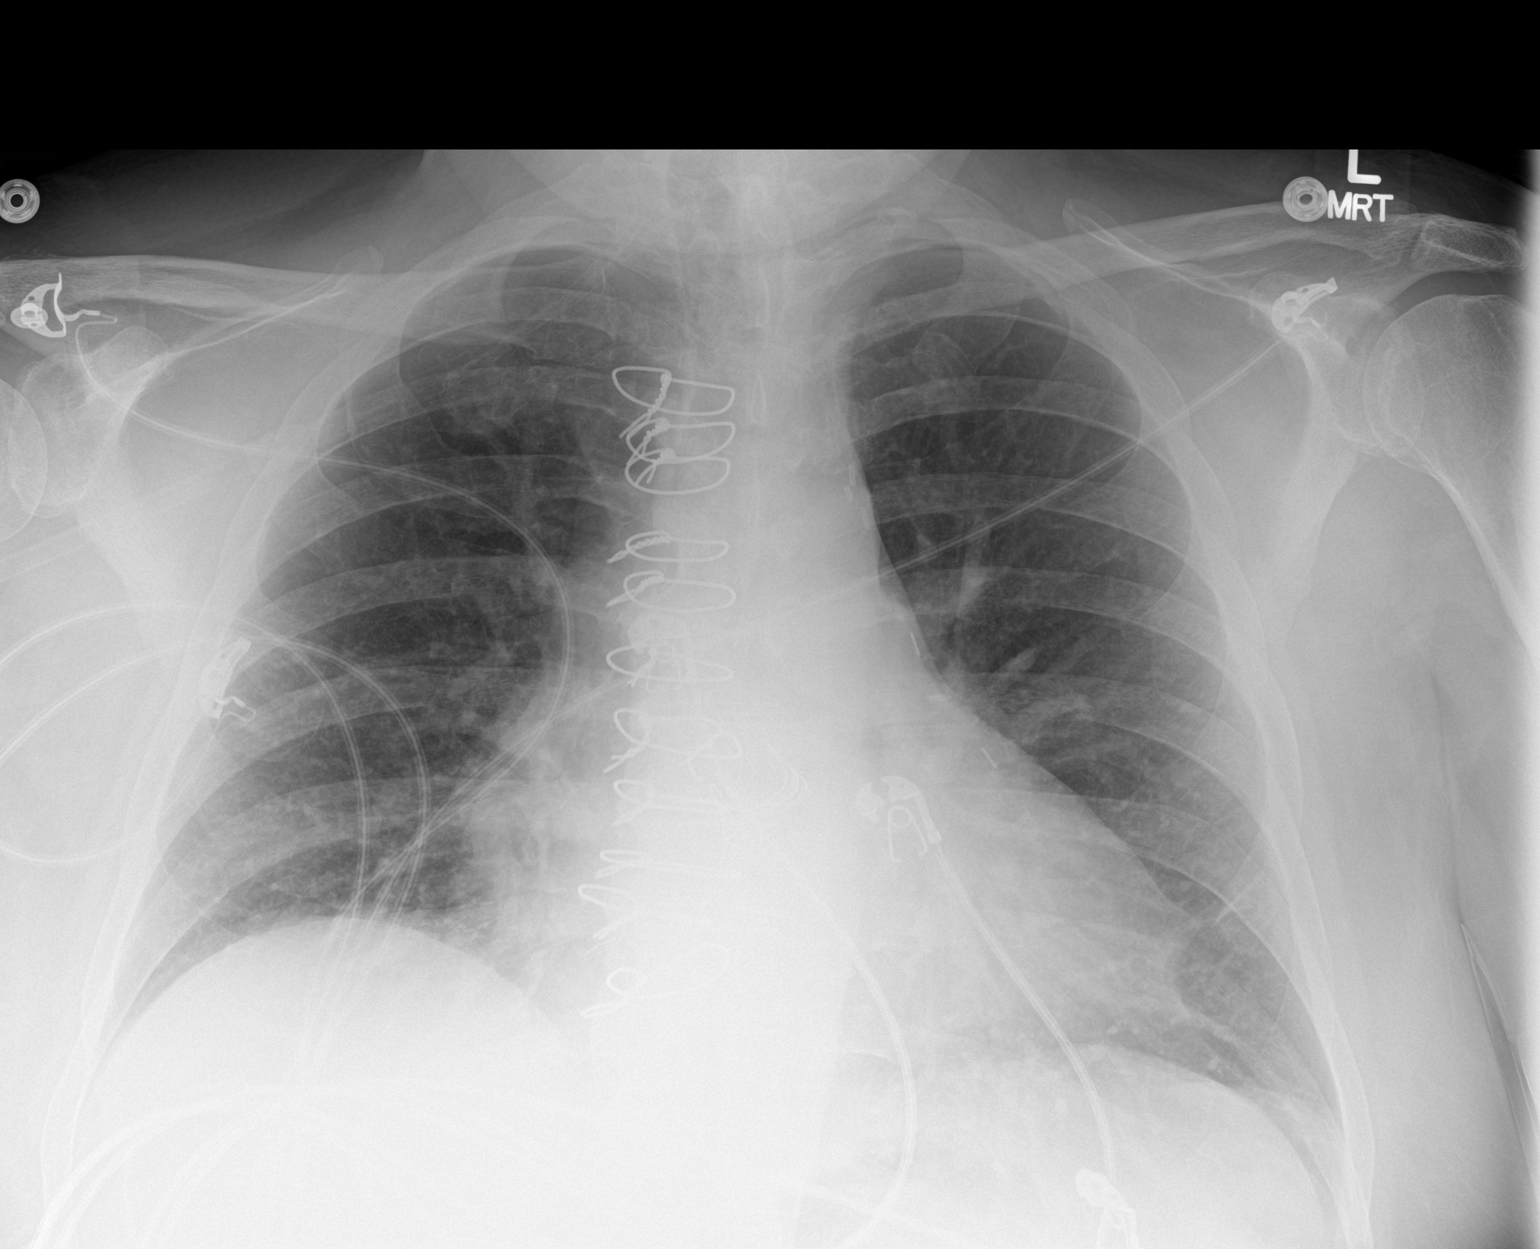

[1 of 1 positions shown; findings below may reference images not displayed]

FINDINGS: Previous median sternotomy and CABG procedure. Mild cardiac
enlargement. Both lungs are clear. The visualized skeletal
structures are unremarkable.
IMPRESSION: No active disease.

## 2019-06-01 IMAGING — CR DG CHEST 2V
1 series · 2 of 2 positions shown · non-contrast
Comparison: Chest x-ray 04/01/2015.

CLINICAL DATA: 66-year-old female with history of central chest
pain today.

EXAM:
CHEST  2 VIEW

[Series 1: dg chest 2 view · 0.14mm/px · 2 of 2 slices shown]
[im 1/2]
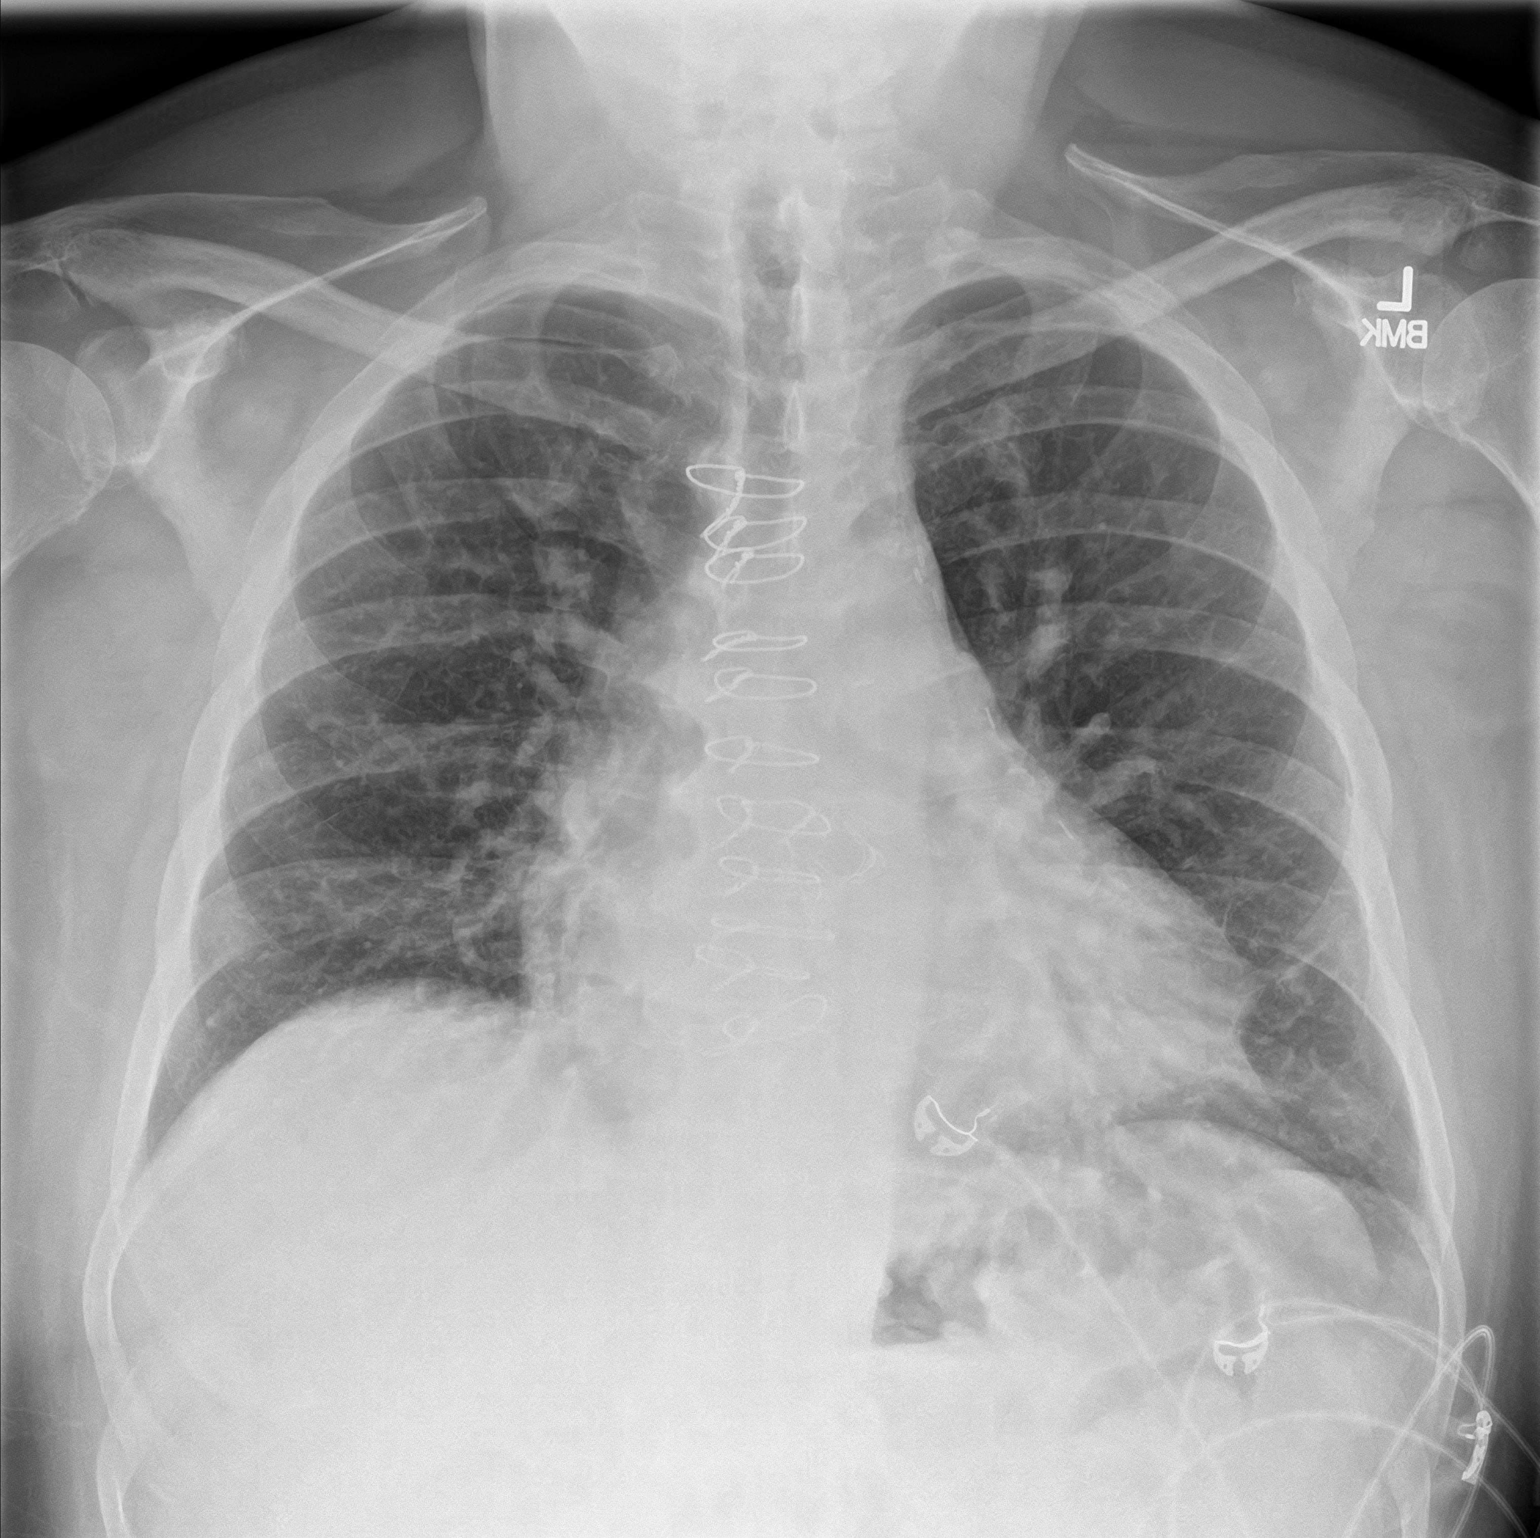
[im 2/2]
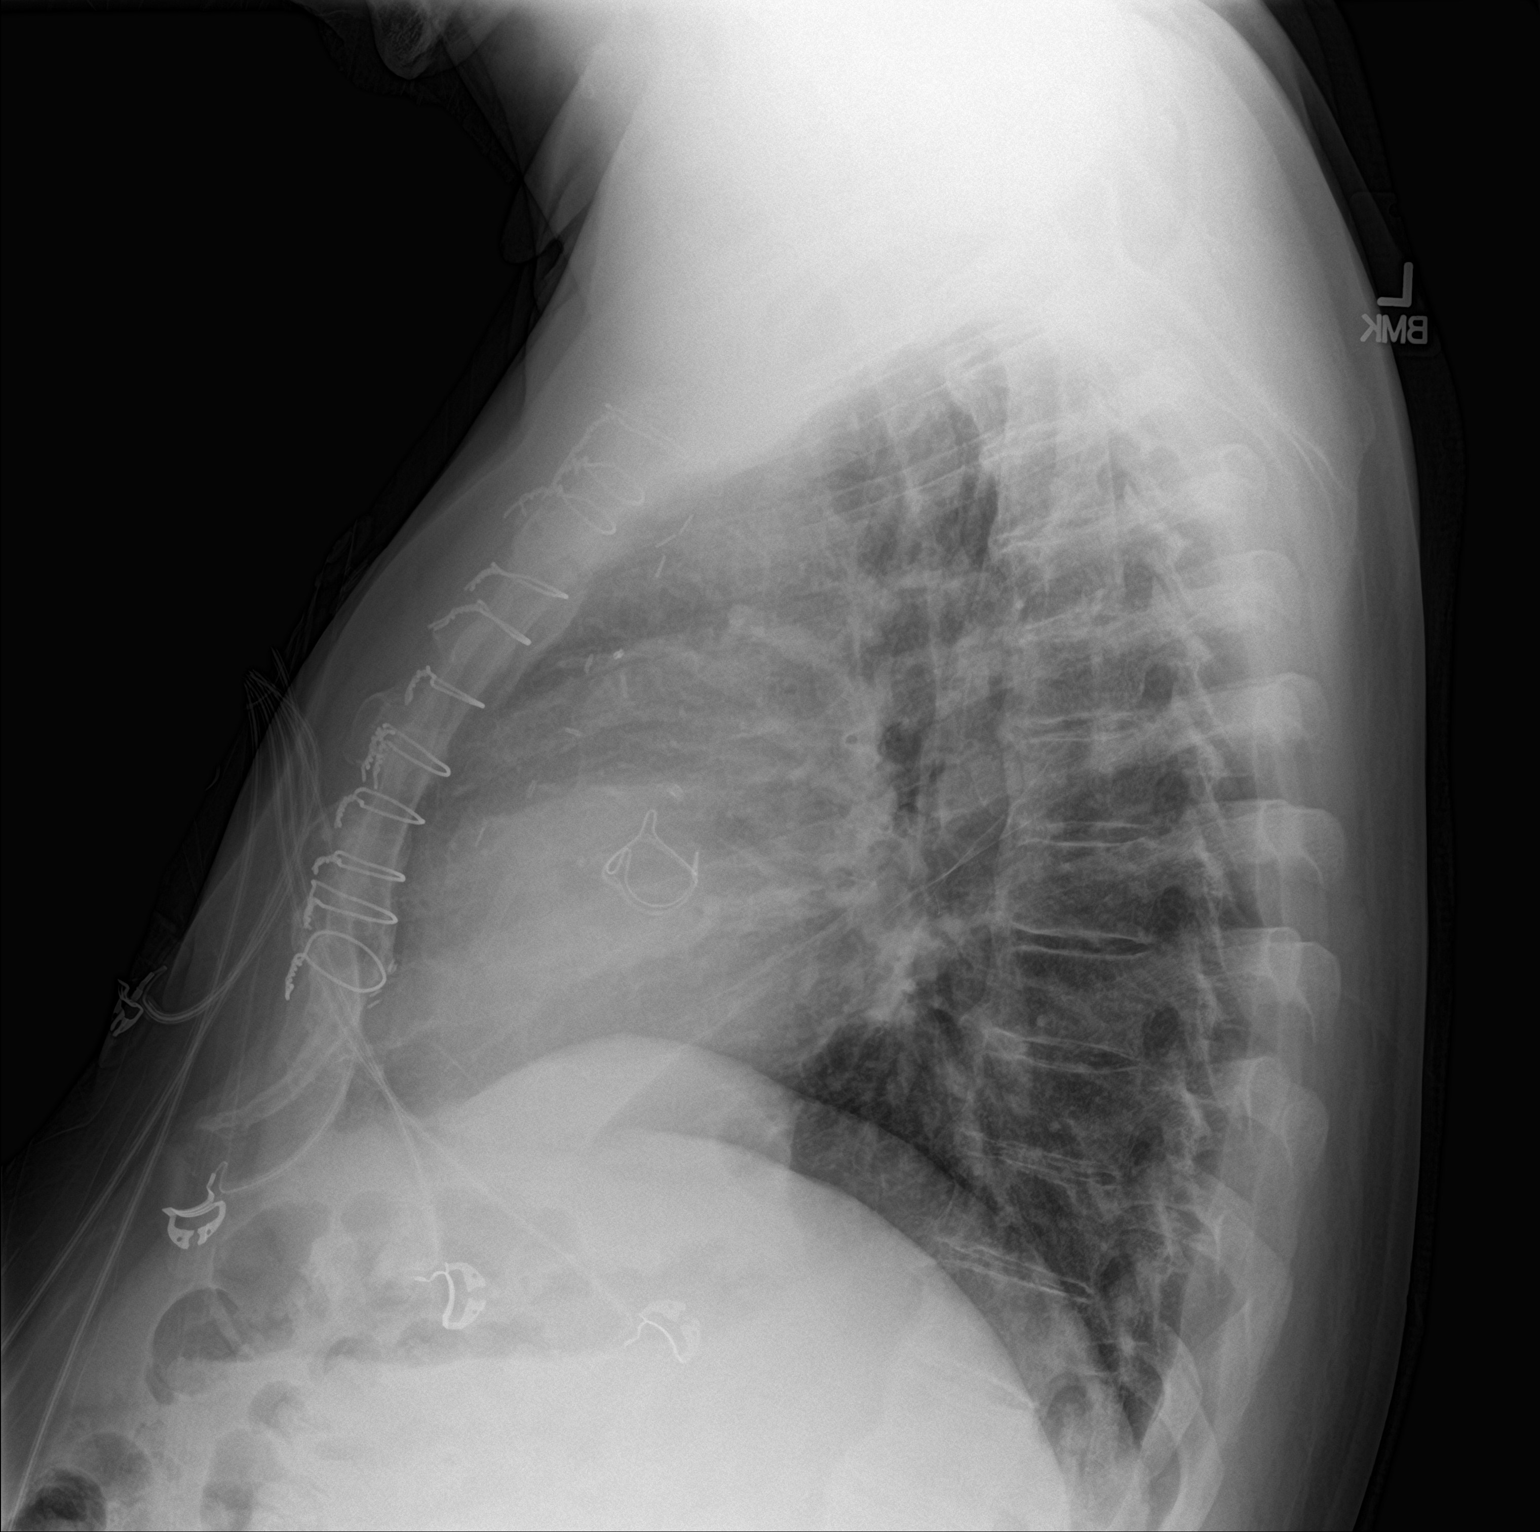

[2 of 2 positions shown; findings below may reference images not displayed]

FINDINGS: Lung volumes are slightly low. No acute consolidative airspace
disease. No pleural effusions. Cephalization of the pulmonary
vasculature, without frank pulmonary edema. Heart size is mildly
enlarged. Upper mediastinal contours are within normal limits.
Status post median sternotomy for aortic valve replacement and CABG
including [REDACTED] to the LAD.
IMPRESSION: 1. Pulmonary venous congestion, without frank pulmonary edema.
2. Postoperative changes, as above.

## 2019-06-15 IMAGING — CR DG CHEST 2V
2 series · 2 of 2 positions shown · non-contrast
Comparison: 04/23/2017

CLINICAL DATA: Chest pain starting yesterday. Numbness to the left
side of the chest.

EXAM:
CHEST  2 VIEW

[chest pa]
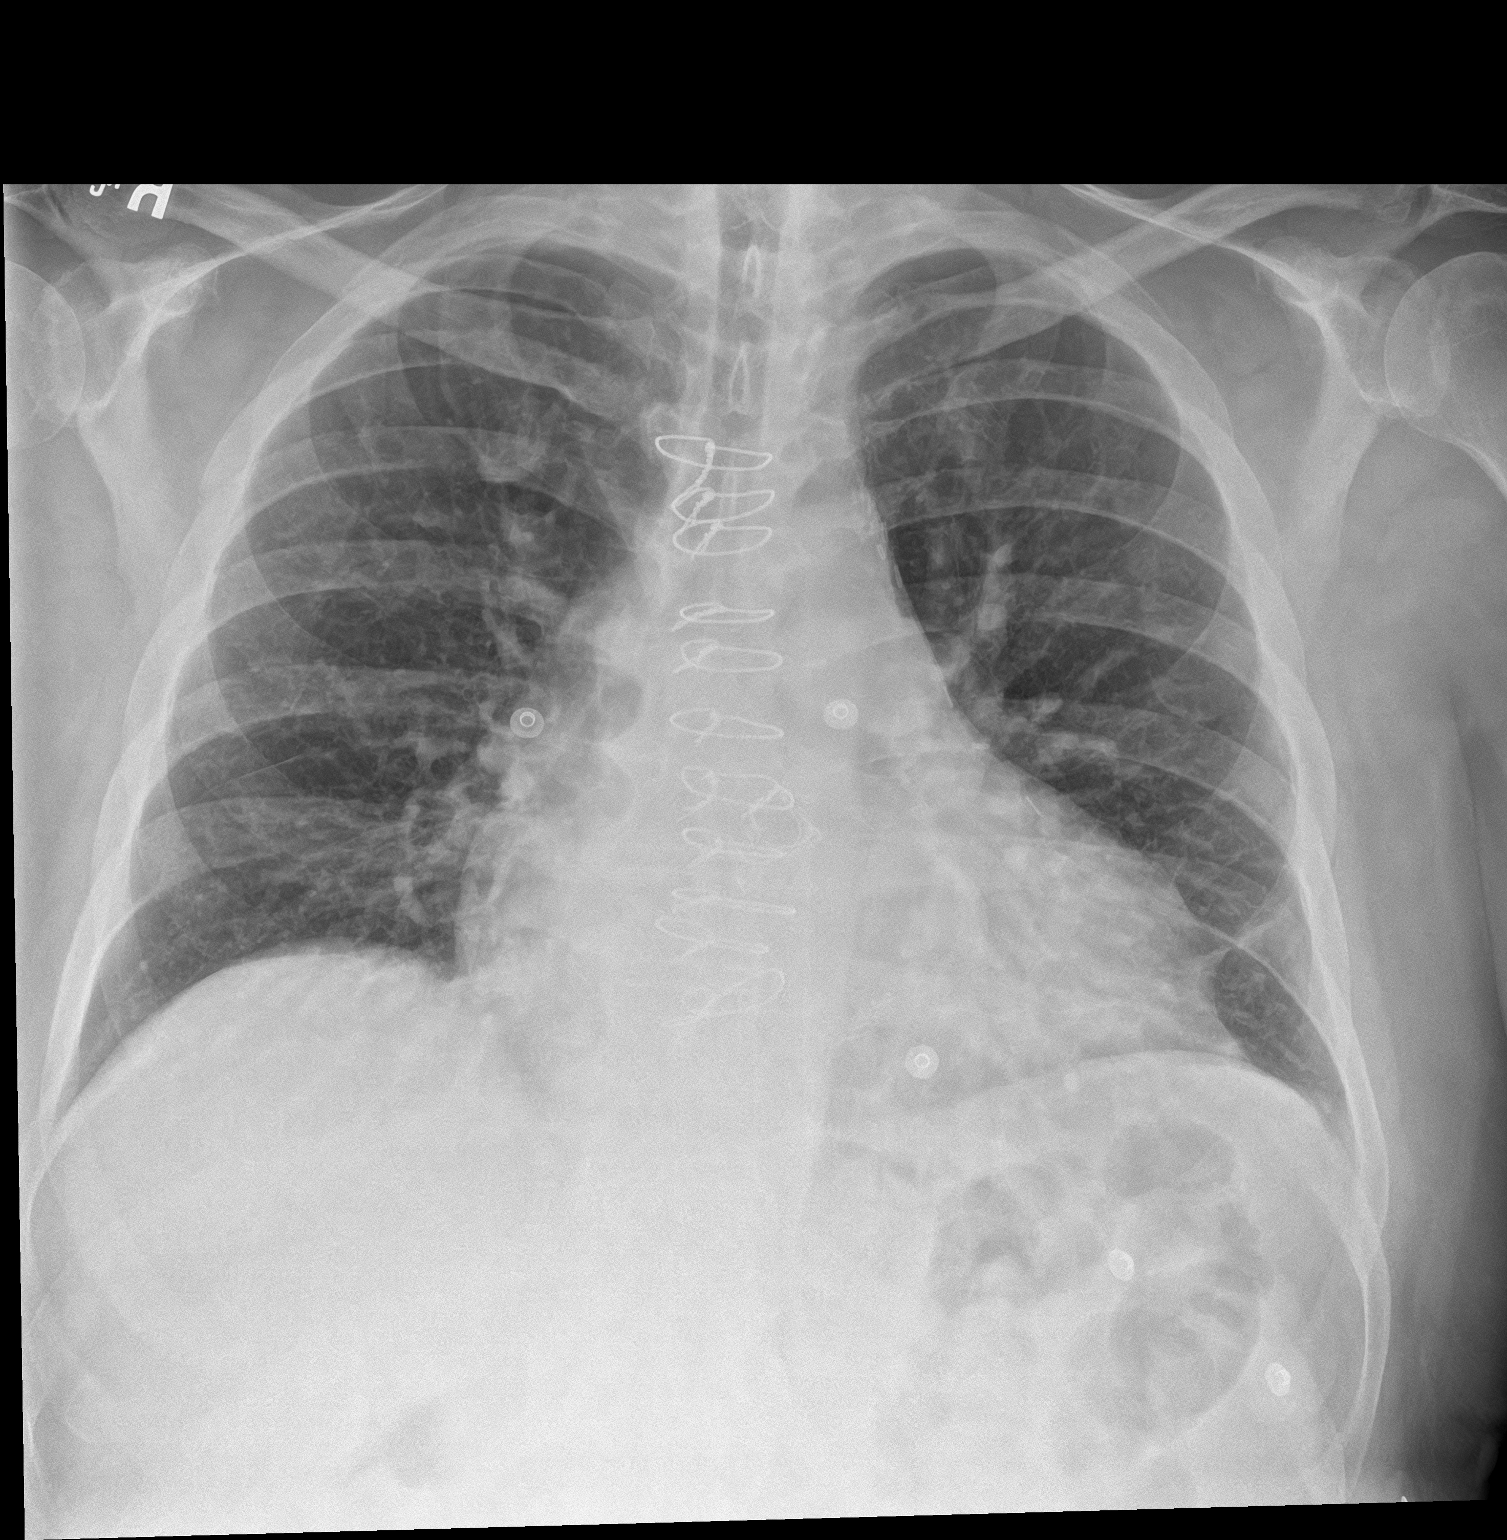

[chest lat]
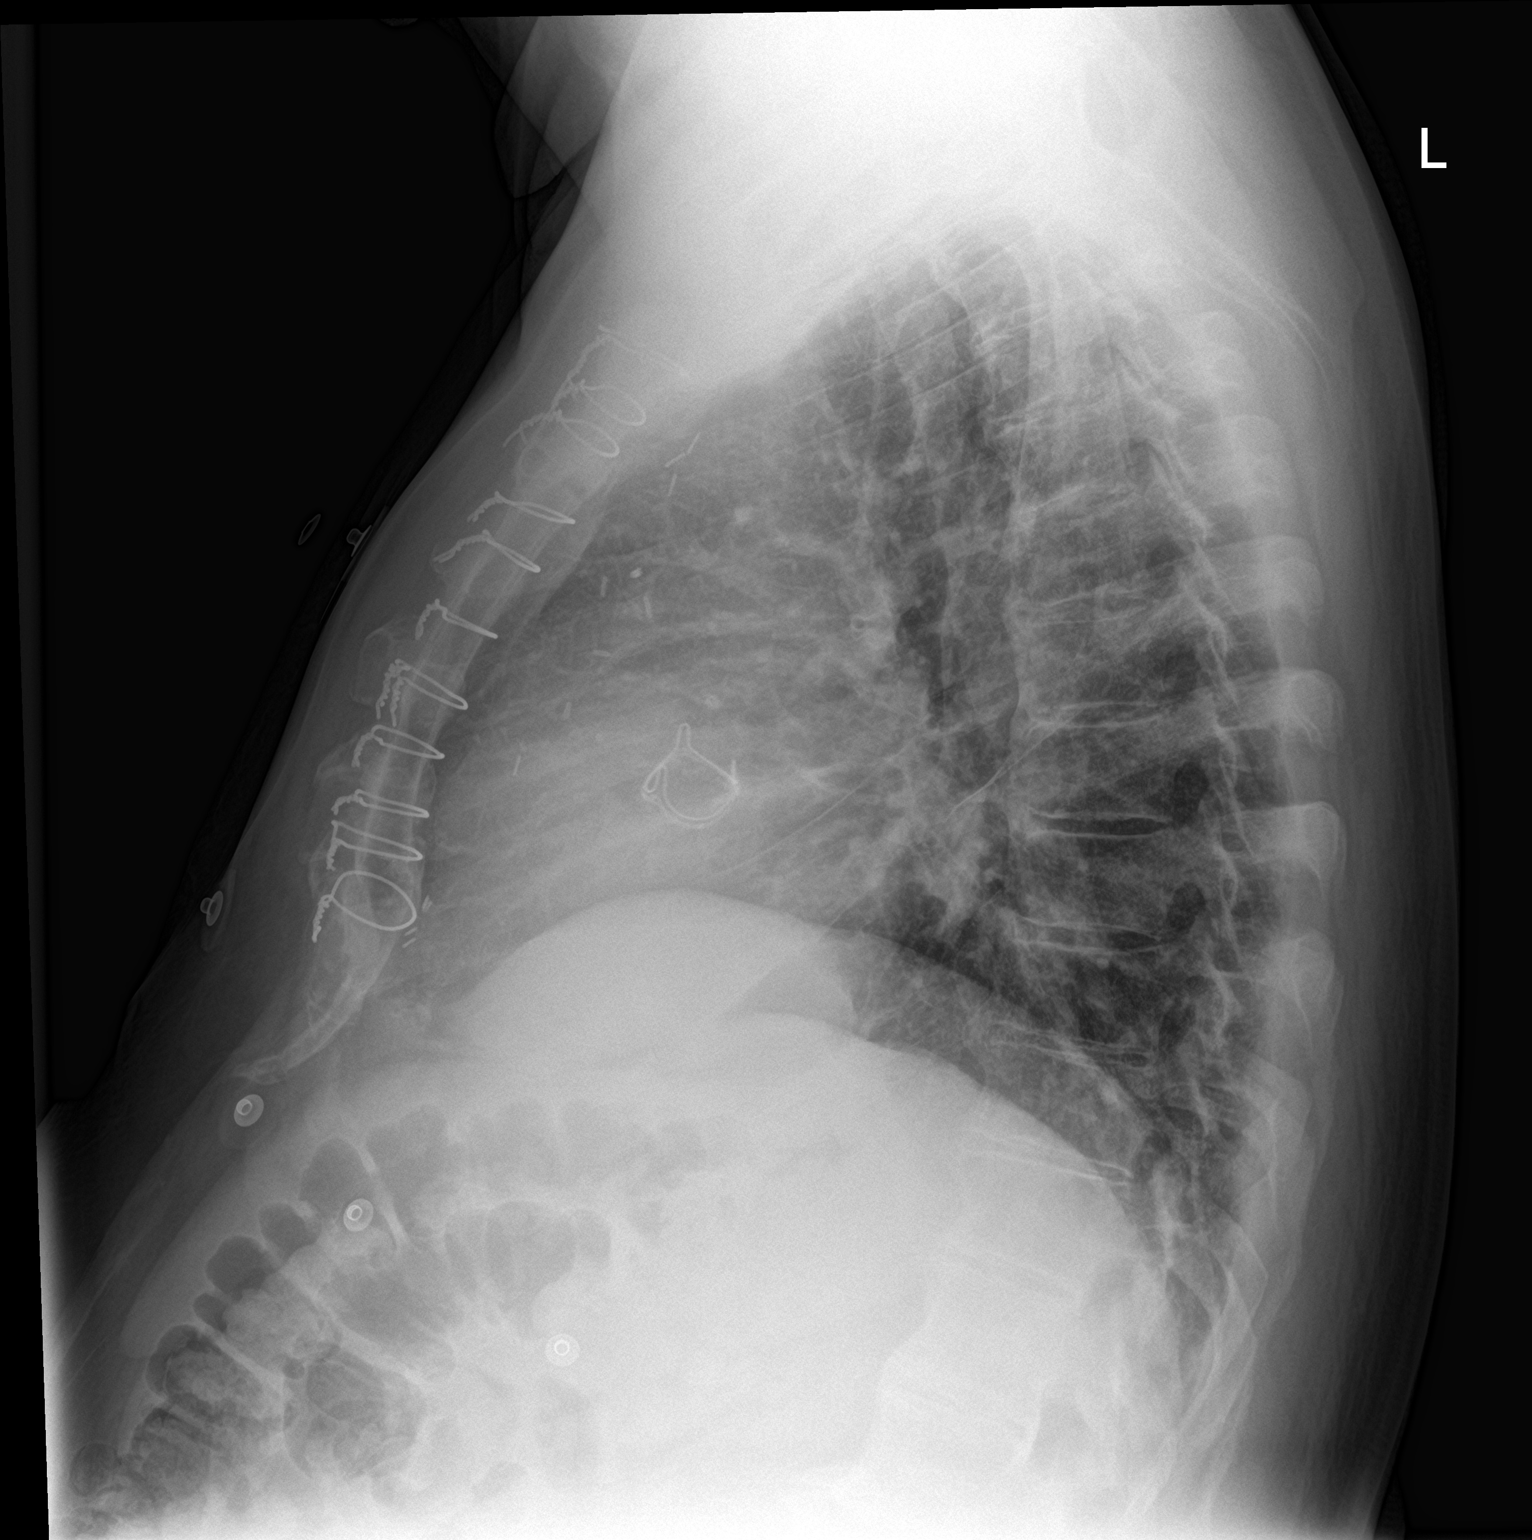

[2 of 2 positions shown; findings below may reference images not displayed]

FINDINGS: Postoperative changes in the mediastinum. Cardiac enlargement. Mild
central vascular congestion. No edema or consolidation. Similar
appearance to previous study. No pleural effusions. No pneumothorax.
Mediastinal contours appear intact. Degenerative changes in the
spine.
IMPRESSION: Cardiac enlargement with mild vascular congestion. No edema or
consolidation.

## 2019-09-14 IMAGING — CR DG CHEST 2V
2 series · 2 of 2 positions shown · non-contrast
Comparison: August 05, 2017

CLINICAL DATA: Pain following fall

EXAM:
CHEST  2 VIEW

[chest pa]
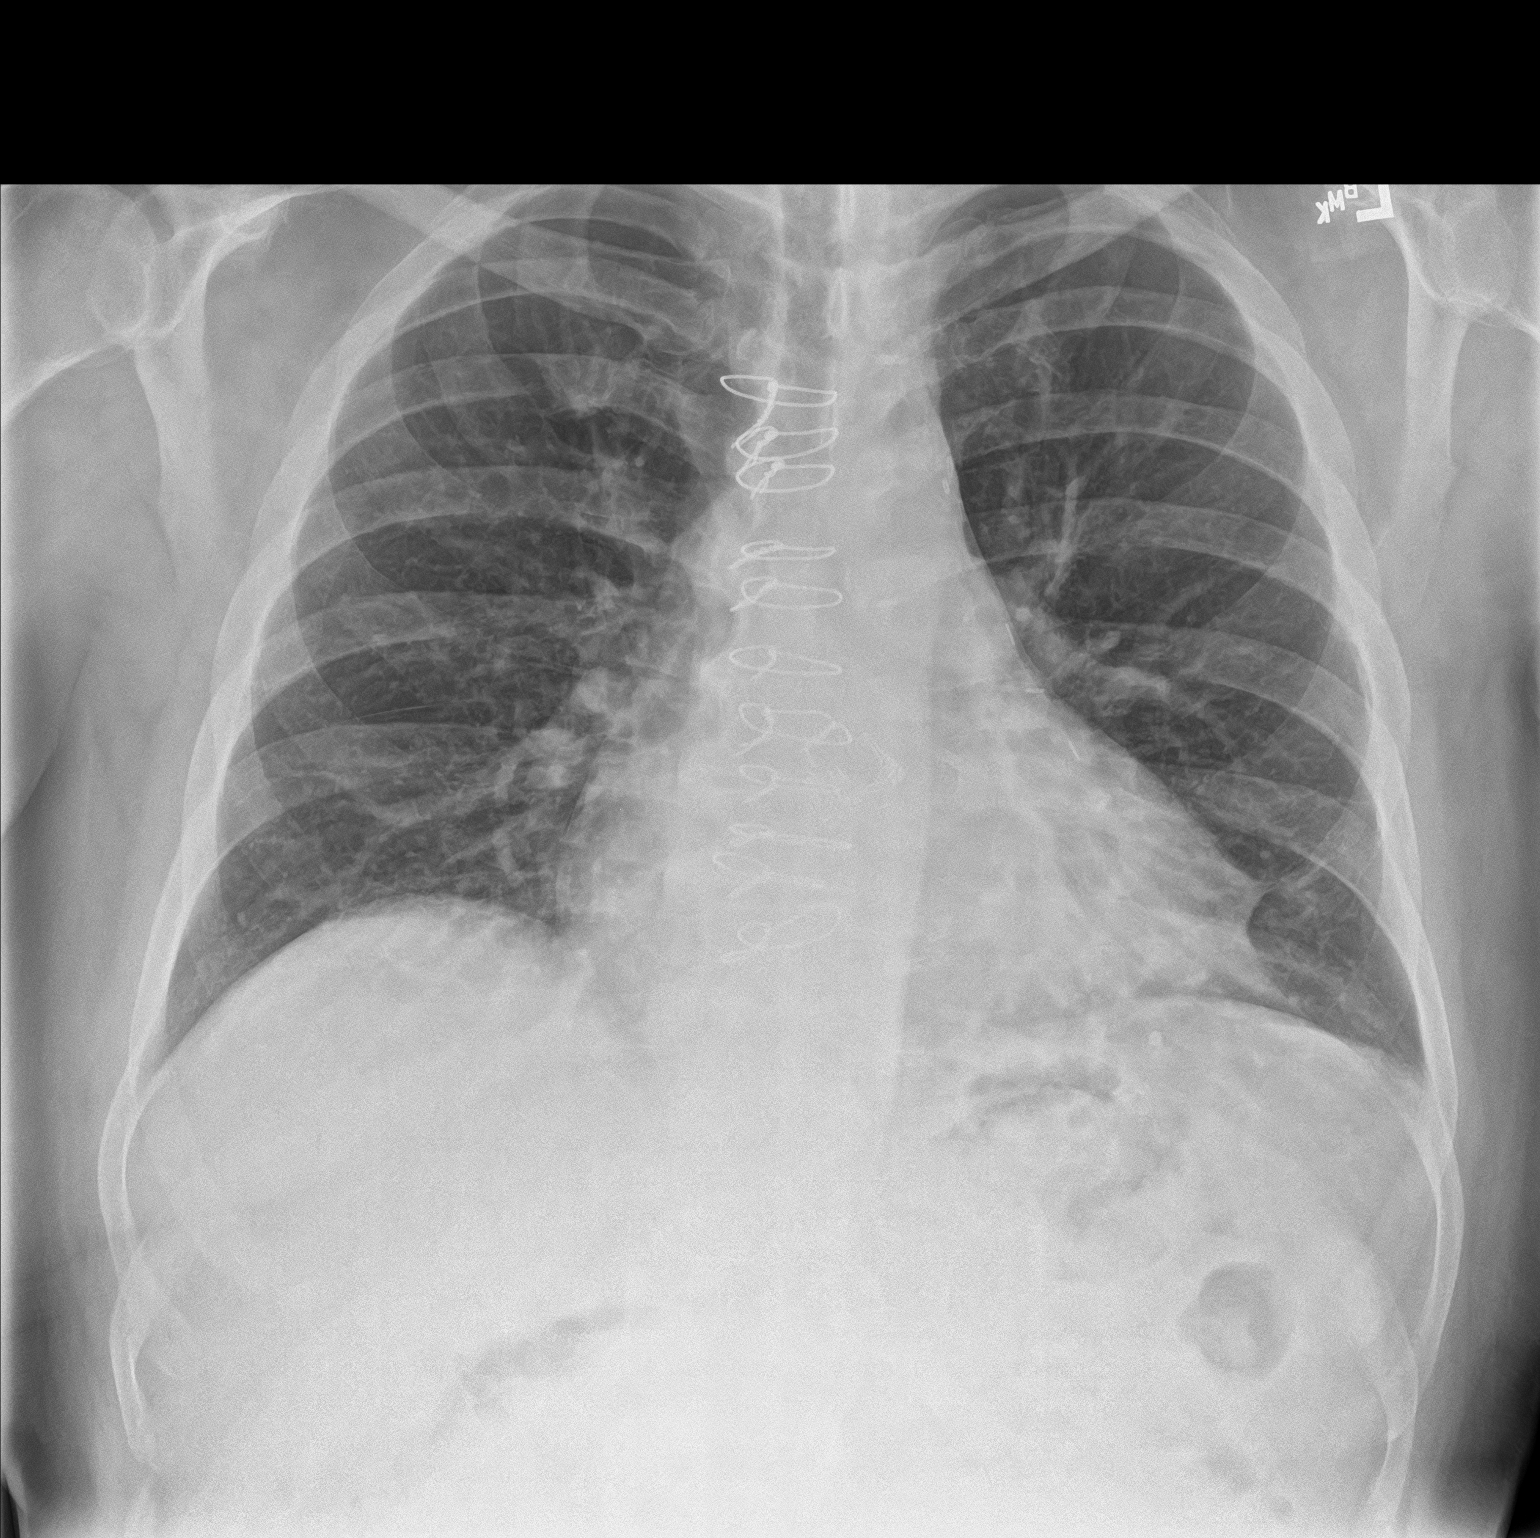

[chest lat]
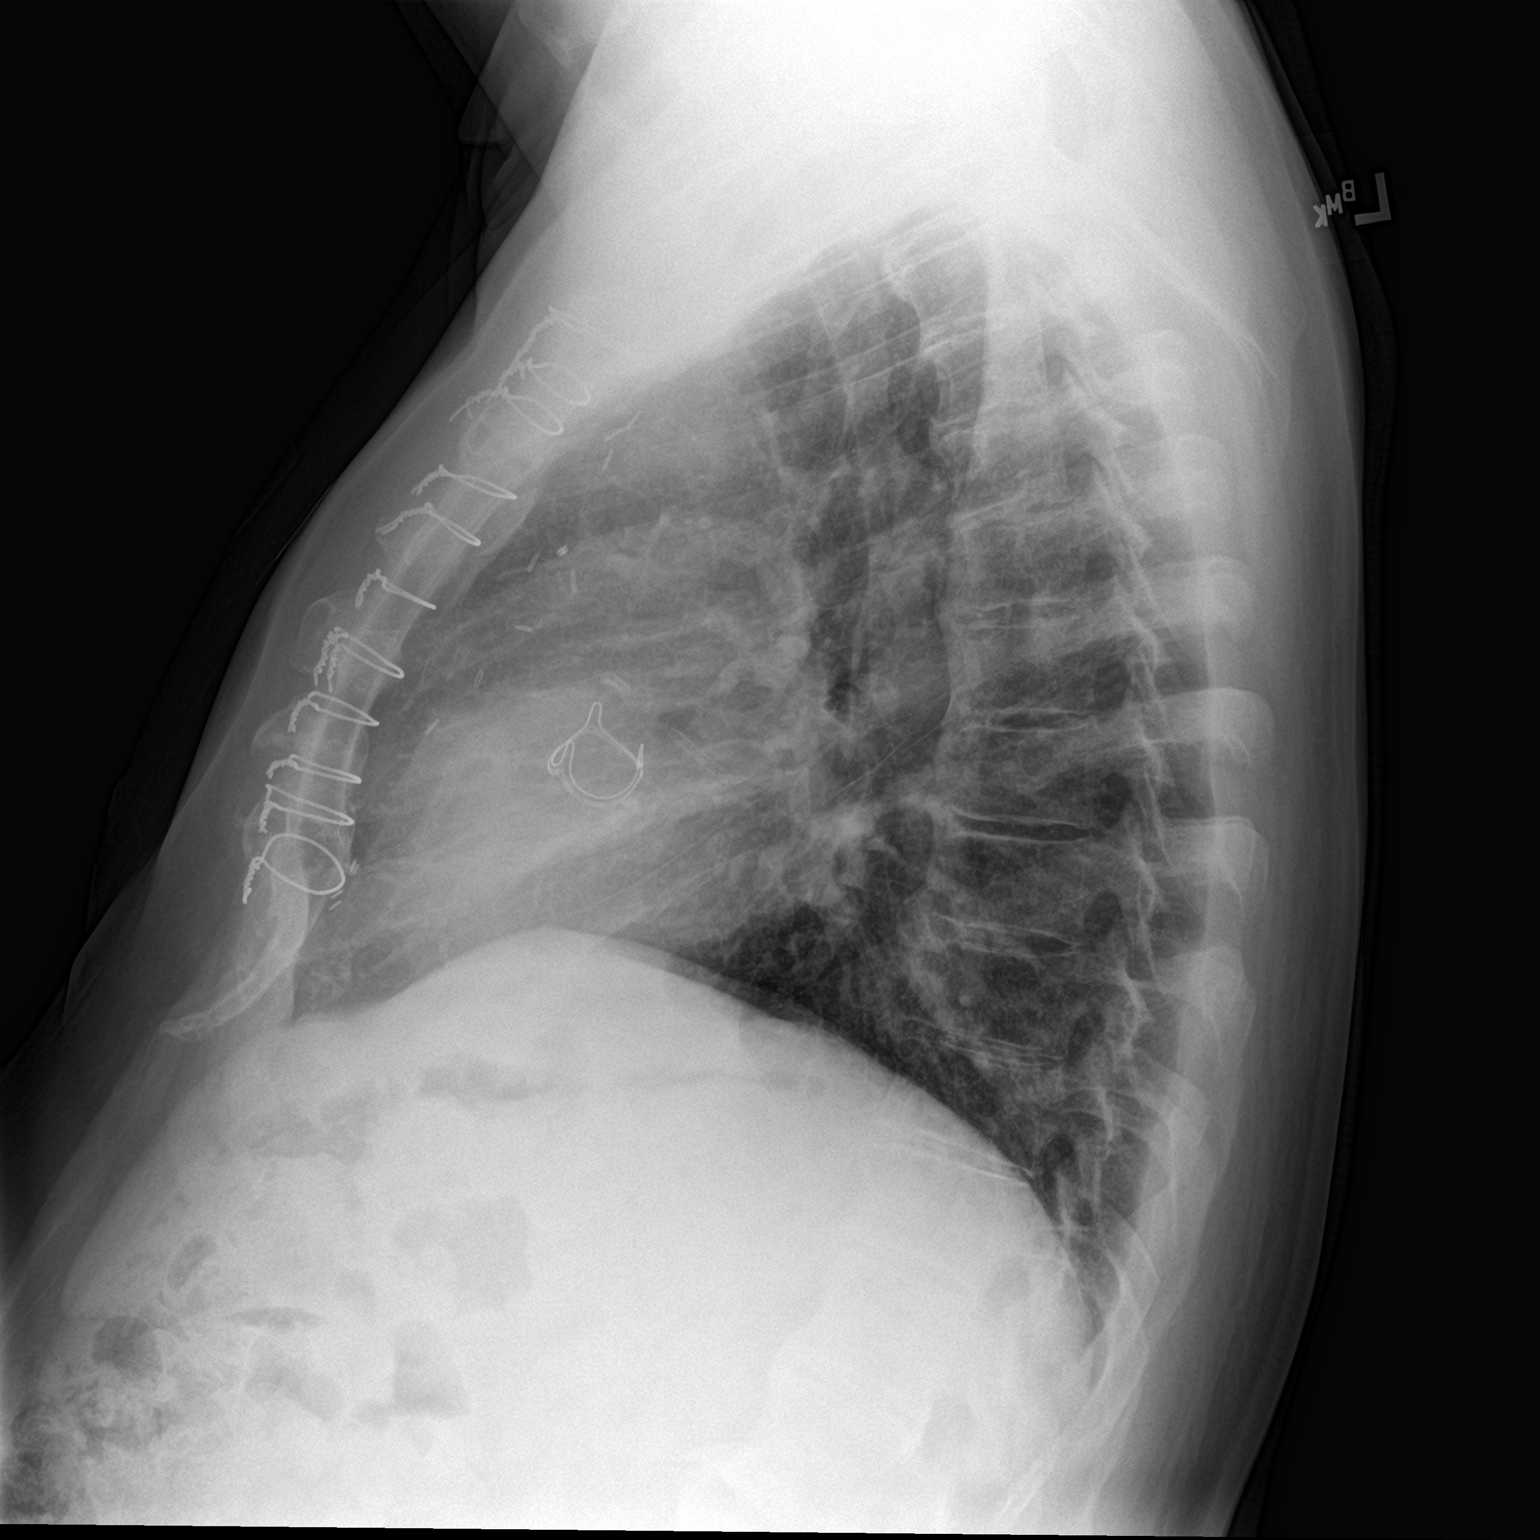

[2 of 2 positions shown; findings below may reference images not displayed]

FINDINGS: There is scarring in the left base region. There is no edema or
consolidation. Heart is upper normal in size with pulmonary
vascularity within normal limits. No adenopathy. There is an aortic
valve replacement. Patient is status post internal mammary bypass
grafting. No pneumothorax. There is degenerative change in the
thoracic spine.
IMPRESSION: Scarring left base. No edema or consolidation. Postoperative
changes. No pneumothorax.
